# Patient Record
Sex: Female | Born: 1990 | Race: Black or African American | Hispanic: No | Marital: Single | Smoking: Former smoker
Health system: Southern US, Community
[De-identification: ages and names within clinical notes are randomized; demographics above are authoritative.]

## PROBLEM LIST (undated history)

## (undated) ENCOUNTER — Inpatient Hospital Stay (HOSPITAL_COMMUNITY): Payer: Self-pay

## (undated) DIAGNOSIS — E669 Obesity, unspecified: Secondary | ICD-10-CM

## (undated) DIAGNOSIS — R0602 Shortness of breath: Secondary | ICD-10-CM

## (undated) DIAGNOSIS — K219 Gastro-esophageal reflux disease without esophagitis: Secondary | ICD-10-CM

## (undated) DIAGNOSIS — R600 Localized edema: Secondary | ICD-10-CM

## (undated) DIAGNOSIS — R079 Chest pain, unspecified: Secondary | ICD-10-CM

## (undated) DIAGNOSIS — M549 Dorsalgia, unspecified: Secondary | ICD-10-CM

## (undated) DIAGNOSIS — Q6589 Other specified congenital deformities of hip: Secondary | ICD-10-CM

## (undated) DIAGNOSIS — M1990 Unspecified osteoarthritis, unspecified site: Secondary | ICD-10-CM

## (undated) DIAGNOSIS — M2550 Pain in unspecified joint: Secondary | ICD-10-CM

## (undated) DIAGNOSIS — I10 Essential (primary) hypertension: Secondary | ICD-10-CM

## (undated) DIAGNOSIS — F32A Depression, unspecified: Secondary | ICD-10-CM

## (undated) DIAGNOSIS — K5900 Constipation, unspecified: Secondary | ICD-10-CM

## (undated) DIAGNOSIS — M5430 Sciatica, unspecified side: Secondary | ICD-10-CM

## (undated) DIAGNOSIS — F329 Major depressive disorder, single episode, unspecified: Secondary | ICD-10-CM

## (undated) DIAGNOSIS — M069 Rheumatoid arthritis, unspecified: Secondary | ICD-10-CM

## (undated) DIAGNOSIS — M24859 Other specific joint derangements of unspecified hip, not elsewhere classified: Secondary | ICD-10-CM

## (undated) DIAGNOSIS — F419 Anxiety disorder, unspecified: Secondary | ICD-10-CM

## (undated) HISTORY — DX: Anxiety disorder, unspecified: F41.9

## (undated) HISTORY — DX: Unspecified osteoarthritis, unspecified site: M19.90

## (undated) HISTORY — DX: Gastro-esophageal reflux disease without esophagitis: K21.9

## (undated) HISTORY — DX: Chest pain, unspecified: R07.9

## (undated) HISTORY — DX: Pain in unspecified joint: M25.50

## (undated) HISTORY — DX: Localized edema: R60.0

## (undated) HISTORY — DX: Constipation, unspecified: K59.00

## (undated) HISTORY — DX: Dorsalgia, unspecified: M54.9

## (undated) HISTORY — DX: Rheumatoid arthritis, unspecified: M06.9

## (undated) HISTORY — DX: Shortness of breath: R06.02

## (undated) HISTORY — DX: Other specified congenital deformities of hip: Q65.89

## (undated) HISTORY — DX: Depression, unspecified: F32.A

## (undated) HISTORY — DX: Other specific joint derangements of unspecified hip, not elsewhere classified: M24.859

## (undated) NOTE — *Deleted (*Deleted)
It was great to see you!  Our plans for today:  -Today we discussed your back pain.*** -   We are checking some labs today, I will call you if they are abnormal will send you a MyChart message or a letter if they are normal.  If you do not hear about your labs in the next 2 weeks please let us know.***  Take care and seek immediate care sooner if you develop any concerns.   Dr. [facility]

---
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
## 2008-04-07 ENCOUNTER — Encounter: Payer: Self-pay | Admitting: Family Medicine

## 2008-07-13 ENCOUNTER — Encounter: Payer: Self-pay | Admitting: Family Medicine

## 2008-07-15 ENCOUNTER — Telehealth: Payer: Self-pay | Admitting: *Deleted

## 2008-08-17 ENCOUNTER — Emergency Department (HOSPITAL_COMMUNITY): Admission: EM | Admit: 2008-08-17 | Discharge: 2008-08-17 | Payer: Self-pay | Admitting: Emergency Medicine

## 2008-10-08 ENCOUNTER — Emergency Department (HOSPITAL_COMMUNITY): Admission: EM | Admit: 2008-10-08 | Discharge: 2008-10-08 | Payer: Self-pay | Admitting: Emergency Medicine

## 2008-10-24 ENCOUNTER — Inpatient Hospital Stay (HOSPITAL_COMMUNITY): Admission: AD | Admit: 2008-10-24 | Discharge: 2008-10-24 | Payer: Self-pay | Admitting: Obstetrics & Gynecology

## 2009-02-11 ENCOUNTER — Inpatient Hospital Stay (HOSPITAL_COMMUNITY): Admission: AD | Admit: 2009-02-11 | Discharge: 2009-02-11 | Payer: Self-pay | Admitting: Obstetrics and Gynecology

## 2009-04-13 ENCOUNTER — Inpatient Hospital Stay (HOSPITAL_COMMUNITY): Admission: AD | Admit: 2009-04-13 | Discharge: 2009-04-13 | Payer: Self-pay | Admitting: Obstetrics and Gynecology

## 2009-05-08 ENCOUNTER — Inpatient Hospital Stay (HOSPITAL_COMMUNITY): Admission: AD | Admit: 2009-05-08 | Discharge: 2009-05-08 | Payer: Self-pay | Admitting: Obstetrics and Gynecology

## 2009-05-19 ENCOUNTER — Inpatient Hospital Stay (HOSPITAL_COMMUNITY): Admission: RE | Admit: 2009-05-19 | Discharge: 2009-05-21 | Payer: Self-pay | Admitting: Obstetrics and Gynecology

## 2009-05-19 ENCOUNTER — Encounter (INDEPENDENT_AMBULATORY_CARE_PROVIDER_SITE_OTHER): Payer: Self-pay | Admitting: Obstetrics and Gynecology

## 2009-05-19 DIAGNOSIS — O365990 Maternal care for other known or suspected poor fetal growth, unspecified trimester, not applicable or unspecified: Secondary | ICD-10-CM

## 2009-05-19 DIAGNOSIS — O10919 Unspecified pre-existing hypertension complicating pregnancy, unspecified trimester: Secondary | ICD-10-CM

## 2010-07-31 NOTE — Assessment & Plan Note (Signed)
Summary: DEPO/KH   Nurse Visit    Prior Medications: DOXY-CAPS 100 MG CAPS (DOXYCYCLINE HYCLATE) one two times a day x 14 days     Medication Administration  Injection # 1:    Medication: Depo-Provera 150mg     Diagnosis: CONTRACEPTIVE MANAGEMENT (ICD-V25.09)    Route: IM    Site: L deltoid    Exp Date: 12/29/2009    Lot #: [id number]    Mfr: pfizer    Comments: Pt infomred to RTC June 12 - June 26    Patient tolerated injection without complications    Given by: Jone Baseman CMA (September 25, 2007 11:35 AM)  Orders Added: 1)  Depo-Provera 150mg  [J1055] 2)  Est Level 1- [facility] [[id number]]    ]

## 2010-10-03 LAB — CBC
Hemoglobin: 11.1 g/dL — ABNORMAL LOW (ref 12.0–15.0)
MCHC: 32.7 g/dL (ref 30.0–36.0)
MCV: 82.9 fL (ref 78.0–100.0)
Platelets: 242 10*3/uL (ref 150–400)
Platelets: 275 10*3/uL (ref 150–400)
RBC: 4.04 MIL/uL (ref 3.87–5.11)
RDW: 14.1 % (ref 11.5–15.5)
RDW: 14.3 % (ref 11.5–15.5)
WBC: 9.8 10*3/uL (ref 4.0–10.5)
WBC: 11.6 10*3/uL — ABNORMAL HIGH (ref 4.0–10.5)

## 2010-10-03 LAB — RPR: RPR Ser Ql: NONREACTIVE

## 2010-10-03 LAB — COMPREHENSIVE METABOLIC PANEL
ALT: 11 U/L (ref 0–35)
AST: 16 U/L (ref 0–37)
Alkaline Phosphatase: 157 U/L — ABNORMAL HIGH (ref 39–117)
CO2: 24 mEq/L (ref 19–32)
GFR calc non Af Amer: >60 mL/min (ref >60)
Glucose, Bld: 81 mg/dL (ref 70–99)
Potassium: 3.8 mEq/L (ref 3.5–5.1)
Sodium: 134 mEq/L — ABNORMAL LOW (ref 135–145)

## 2010-10-03 LAB — LACTATE DEHYDROGENASE: LDH: 133 U/L (ref 94–250)

## 2010-10-04 LAB — WET PREP, GENITAL: Yeast Wet Prep HPF POC: NONE SEEN

## 2010-10-06 LAB — URINALYSIS, ROUTINE W REFLEX MICROSCOPIC
Ketones, ur: NEGATIVE mg/dL
Nitrite: NEGATIVE
Specific Gravity, Urine: >1.03 — ABNORMAL HIGH (ref 1.005–1.030)
Urobilinogen, UA: 0.2 mg/dL (ref 0.0–1.0)
pH: 6 (ref 5.0–8.0)

## 2010-10-06 LAB — WET PREP, GENITAL
Trich, Wet Prep: NONE SEEN
Yeast Wet Prep HPF POC: NONE SEEN

## 2010-10-06 LAB — URINE MICROSCOPIC-ADD ON

## 2010-10-06 LAB — STREP B DNA PROBE

## 2010-10-06 LAB — FETAL FIBRONECTIN: Fetal Fibronectin: NEGATIVE

## 2010-10-09 ENCOUNTER — Emergency Department (HOSPITAL_COMMUNITY)
Admission: EM | Admit: 2010-10-09 | Discharge: 2010-10-09 | Disposition: A | Discharge status: Home / Self Care | Payer: Self-pay | Attending: Emergency Medicine | Admitting: Emergency Medicine

## 2010-10-09 DIAGNOSIS — I10 Essential (primary) hypertension: Secondary | ICD-10-CM | POA: Insufficient documentation

## 2010-10-09 DIAGNOSIS — R112 Nausea with vomiting, unspecified: Secondary | ICD-10-CM | POA: Insufficient documentation

## 2010-10-09 DIAGNOSIS — R42 Dizziness and giddiness: Secondary | ICD-10-CM | POA: Insufficient documentation

## 2010-10-09 LAB — URINALYSIS, ROUTINE W REFLEX MICROSCOPIC
Bilirubin Urine: NEGATIVE
Glucose, UA: NEGATIVE mg/dL
Hgb urine dipstick: NEGATIVE
Ketones, ur: NEGATIVE mg/dL
Nitrite: NEGATIVE
Specific Gravity, Urine: 1.022 (ref 1.005–1.030)
pH: 5.5 (ref 5.0–8.0)

## 2010-10-09 LAB — URINE MICROSCOPIC-ADD ON

## 2010-10-09 LAB — POCT I-STAT, CHEM 8
BUN: 14 mg/dL (ref 6–23)
Calcium, Ion: 1.17 mmol/L (ref 1.12–1.32)
Chloride: 105 mEq/L (ref 96–112)
Creatinine, Ser: 1.2 mg/dL (ref 0.4–1.2)
Glucose, Bld: 90 mg/dL (ref 70–99)
TCO2: 27 mmol/L (ref 0–100)

## 2010-10-10 LAB — URINALYSIS, ROUTINE W REFLEX MICROSCOPIC
Bilirubin Urine: NEGATIVE
Bilirubin Urine: NEGATIVE
Hgb urine dipstick: NEGATIVE
Ketones, ur: NEGATIVE mg/dL
Nitrite: NEGATIVE
Nitrite: NEGATIVE
Specific Gravity, Urine: 1.009 (ref 1.005–1.030)
Urobilinogen, UA: 1 mg/dL (ref 0.0–1.0)
pH: 7 (ref 5.0–8.0)

## 2010-10-10 LAB — CBC
HCT: 37 % (ref 36.0–46.0)
Hemoglobin: 12.3 g/dL (ref 12.0–15.0)
RDW: 14.1 % (ref 11.5–15.5)
WBC: 12.3 10*3/uL — ABNORMAL HIGH (ref 4.0–10.5)

## 2010-10-10 LAB — GC/CHLAMYDIA PROBE AMP, GENITAL: Chlamydia, DNA Probe: POSITIVE — AB

## 2010-10-10 LAB — URINE MICROSCOPIC-ADD ON

## 2010-10-10 LAB — HCG, QUANTITATIVE, PREGNANCY: hCG, Beta Chain, Quant, S: 65423 m[IU]/mL — ABNORMAL HIGH (ref <5)

## 2010-10-10 LAB — WET PREP, GENITAL

## 2010-11-16 NOTE — Discharge Summary (Signed)
Shirley [name], Shirley [name]              ACCOUNT NO.:  [id number]   MEDICAL RECORD NO.:  [id number]          PATIENT TYPE:  INP   LOCATION:  9104                          FACILITY:  [facility]   PHYSICIAN:  Tracy L. Mayford Knife, M.D.DATE OF BIRTH:  1990/08/28   DATE OF ADMISSION:  07/30/2005  DATE OF DISCHARGE:  08/03/2005                                 DISCHARGE SUMMARY   DISCHARGE ATTENDING:  Elsie Lincoln, M.D.   REASON FOR ADMISSION:  Induction of labor at 41 weeks for elevated blood  pressure and dates.   DISCHARGE DIAGNOSES:  1.  Status post delivery of 41-week viable female.  Delivery complicated by      a mild shoulder dystocia.  2.  Elevated blood pressure/preeclampsia.  3.  Morbid obesity.   CONSULTANTS:  Social work.   ADMISSION LABORATORY:  Hemoglobin 11.2, platelets 286, creatinine 0.6, LFTs  within normal limits, uric acid 5, LDH 154.   HOSPITAL COURSE:  The patient is a 20 year old G1, P0 at 41 weeks, who was  admitted for induction of labor.  Patient's prenatal care was provided in  [facility], but apparently her care was terminated there and her care was  transferred to the teaching service at greater than [redacted] weeks gestation.  On  admission, the patient's initial blood pressure was 136/88.  The patient  complained of a headache, but it was relieved with Tylenol.  Her lab are as  described above.  Fetal heart rate tracing was reactive and reassuring.  A  sterile vaginal exam revealed she was 1 to 2 cm, 50% effaced and -2 station.  The patient was admitted and started on Cytotec and magnesium.  She  ultimately had three Cytotecs and then was changed to Pitocin.  She  ultimately delivered a viable female infant at 2028 on July 31, 2005 over  an intact perineum.  The baby's Apgars were 8 at one minute and 9 at five  minutes.  Delivery was complicated by a mild shoulder dystocia, which was  relieved by downward traction and suprapubic pressure and McRoberts.  Magnesium was  continued postpartum.  She continued to have some elevated  blood pressures.  She was ultimately started on hydrochlorothiazide and  labetalol.  It was felt that this will not need to be continued for long-  term.  However, she will need this followed up as an outpatient.   DISPOSITION:  To home in stable condition.   FOLLOWUP:  On August 05, 2004, with [facility] nurse.  She needs to get her  blood pressure checked that day, and if there are any problems, they are to  call us.  She also has an appointment with family medicine on August 12, 2005 for evaluation of her blood pressure and then she will have a six-week  postpartum exam at [facility].   DISCHARGE MEDICATIONS:  1.  Hydrochlorothiazide 25 mg p.o. daily.  2.  Labetalol 100 mg p.o. b.i.d.  3.  Ibuprofen 600 mg p.o. q.6h. p.r.n.  4.  Patient received Depo-Provera in the hospital, but she would like to  switch to a NuvaRing at the six-week postpartum exam.   DISCHARGE INSTRUCTIONS:  Routine postpartum instructions provided including  to return for signs of infection including a temperature greater than 100.5,  foul-smelling vaginal discharge or a tender uterine fundus.  She was also  counseled on signs and symptoms of preeclampsia.   DIET:  Regular.   ACTIVITY:  Nothing per vagina x6 weeks.           ______________________________  Marc Morgans Mayford Knife, M.D.     TLW/MEDQ  D:  08/03/2005  T:  08/03/2005  Job:  [id number]

## 2011-03-28 LAB — RAPID URINE DRUG SCREEN, HOSP PERFORMED
Barbiturates: NOT DETECTED
Cocaine: NOT DETECTED
Opiates: NOT DETECTED
Tetrahydrocannabinol: POSITIVE — AB

## 2011-03-28 LAB — URINALYSIS, ROUTINE W REFLEX MICROSCOPIC
Hgb urine dipstick: NEGATIVE
Ketones, ur: 15 — AB
Leukocytes, UA: NEGATIVE
Protein, ur: 30 — AB
Urobilinogen, UA: 1

## 2011-03-28 LAB — DIFFERENTIAL
Basophils Absolute: 0
Lymphocytes Relative: 27
Monocytes Absolute: 0.7
Neutro Abs: 4.1
Neutrophils Relative %: 62

## 2011-03-28 LAB — POCT I-STAT, CHEM 8
Calcium, Ion: 0.97 — ABNORMAL LOW
Creatinine, Ser: 1.1
Glucose, Bld: 97
HCT: 46
Hemoglobin: 15.6 — ABNORMAL HIGH

## 2011-03-28 LAB — URINE CULTURE
Colony Count: NO GROWTH
Culture: NO GROWTH

## 2011-03-28 LAB — POCT CARDIAC MARKERS
CKMB, poc: 1.1
Myoglobin, poc: 223
Operator id: 272551

## 2011-03-28 LAB — POCT PREGNANCY, URINE
Operator id: 272551
Preg Test, Ur: NEGATIVE

## 2011-03-28 LAB — URINE MICROSCOPIC-ADD ON

## 2011-03-28 LAB — CBC
Hemoglobin: 14.4
Platelets: 198
RDW: 14.1

## 2011-03-28 LAB — D-DIMER, QUANTITATIVE: D-Dimer, Quant: 0.62 — ABNORMAL HIGH

## 2011-09-04 ENCOUNTER — Encounter (HOSPITAL_COMMUNITY): Payer: Self-pay | Admitting: Emergency Medicine

## 2011-09-04 ENCOUNTER — Emergency Department (HOSPITAL_COMMUNITY)
Admission: EM | Admit: 2011-09-04 | Discharge: 2011-09-04 | Disposition: A | Discharge status: Home / Self Care | Payer: Self-pay | Attending: Emergency Medicine | Admitting: Emergency Medicine

## 2011-09-04 DIAGNOSIS — R51 Headache: Secondary | ICD-10-CM | POA: Insufficient documentation

## 2011-09-04 DIAGNOSIS — R059 Cough, unspecified: Secondary | ICD-10-CM | POA: Insufficient documentation

## 2011-09-04 DIAGNOSIS — R05 Cough: Secondary | ICD-10-CM | POA: Insufficient documentation

## 2011-09-04 DIAGNOSIS — F17200 Nicotine dependence, unspecified, uncomplicated: Secondary | ICD-10-CM | POA: Insufficient documentation

## 2011-09-04 DIAGNOSIS — J020 Streptococcal pharyngitis: Secondary | ICD-10-CM | POA: Insufficient documentation

## 2011-09-04 DIAGNOSIS — IMO0001 Reserved for inherently not codable concepts without codable children: Secondary | ICD-10-CM | POA: Insufficient documentation

## 2011-09-04 MED ORDER — PENICILLIN V POTASSIUM 500 MG PO TABS: 500.0000 mg | ORAL_TABLET | Freq: Three times a day (TID) | ORAL | Status: AC

## 2011-09-04 MED ORDER — PENICILLIN V POTASSIUM 250 MG PO TABS
500.0000 mg | ORAL_TABLET | Freq: Once | ORAL | Status: AC
  Administered 2011-09-04: 500 mg via ORAL
  Filled 2011-09-04: qty 2

## 2011-09-04 NOTE — ED Notes (Signed)
Patient complaining of headache, fever, sore throat, and body aches for the past two days. Denies shortness of breath and chest pain.

## 2011-09-04 NOTE — Discharge Instructions (Signed)
Strep Throat Strep throat is an infection of the throat caused by a bacteria named Streptococcus pyogenes. Your caregiver may call the infection streptococcal "tonsillitis" or "pharyngitis" depending on whether there are signs of inflammation in the tonsils or back of the throat. Strep throat is most common in children from 40 to 23 years old during the cold months of the year, but it can occur in people of any age during any season. This infection is spread from person to person (contagious) through coughing, sneezing, or other close contact. SYMPTOMS   Fever or chills.   Painful, swollen, red tonsils or throat.   Pain or difficulty when swallowing.   White or yellow spots on the tonsils or throat.   Swollen, tender lymph nodes or "glands" of the neck or under the jaw.   Red rash all over the body (rare).  DIAGNOSIS  Many different infections can cause the same symptoms. A test must be done to confirm the diagnosis so the right treatment can be given. A "rapid strep test" can help your caregiver make the diagnosis in a few minutes. If this test is not available, a light swab of the infected area can be used for a throat culture test. If a throat culture test is done, results are usually available in a day or two. TREATMENT  Strep throat is treated with antibiotic medicine. HOME CARE INSTRUCTIONS   Gargle with 1 tsp of salt in 1 cup of warm water, 3 to 4 times per day or as needed for comfort.   Family members who also have a sore throat or fever should be tested for strep throat and treated with antibiotics if they have the strep infection.   Make sure everyone in your household washes their hands well.   Do not share food, drinking cups, or personal items that could cause the infection to spread to others.   You may need to eat a soft food diet until your sore throat gets better.   Drink enough water and fluids to keep your urine clear or pale yellow. This will help prevent  dehydration.   Get plenty of rest.   Stay home from school, daycare, or work until you have been on antibiotics for 24 hours.   Only take over-the-counter or prescription medicines for pain, discomfort, or fever as directed by your caregiver.   If antibiotics are prescribed, take them as directed. Finish them even if you start to feel better.  SEEK MEDICAL CARE IF:   The glands in your neck continue to enlarge.   You develop a rash, cough, or earache.   You cough up green, yellow-brown, or bloody sputum.   You have pain or discomfort not controlled by medicines.   Your problems seem to be getting worse rather than better.  SEEK IMMEDIATE MEDICAL CARE IF:   You develop any new symptoms such as vomiting, severe headache, stiff or painful neck, chest pain, shortness of breath, or trouble swallowing.   You develop severe throat pain, drooling, or changes in your voice.   You develop swelling of the neck, or the skin on the neck becomes red and tender.   You have a fever.   You develop signs of dehydration, such as fatigue, dry mouth, and decreased urination.   You become increasingly sleepy, or you cannot wake up completely.  Document Released: 06/14/2000 Document Revised: 06/06/2011 Document Reviewed: 08/16/2010 [facility] Patient Information 2012 [place], [place]. Your strep test is positive, he had been given a prescription  for pen VK 3 times a day.  Please make sure to complete the entire course of antibiotics, you can take Tylenol, ibuprofen, in between for discomfort, fevers

## 2011-09-04 NOTE — ED Provider Notes (Signed)
History     CSN: [id number]  Arrival date & time 09/04/11  2003   First MD Initiated Contact with Patient 09/04/11 2027      Chief Complaint  Patient presents with  . Headache  . Generalized Body Aches    (Consider location/radiation/quality/duration/timing/severity/associated sxs/prior treatment) HPI Comments: Patient reports 2 days of sore throat, myalgias, headache, nonproductive cough.  She has been taking over-the-counter Tylenol for fevers, with moderate results  Patient is a 21 y.o. female presenting with headaches. The history is provided by the patient.  Headache  This is a new problem. The current episode started 2 days ago. The problem occurs constantly. The headache is associated with an unknown factor. The pain is located in the frontal and bilateral region. The quality of the pain is described as dull. The pain is at a severity of 4/10. The pain is moderate. The pain does not radiate. Associated symptoms include a fever. Pertinent negatives include no anorexia, no malaise/fatigue, no shortness of breath and no nausea. She has tried NSAIDs for the symptoms.    Past Medical History  Diagnosis Date  . No significant past medical history     History reviewed. No pertinent past surgical history.  History reviewed. No pertinent family history.  History  Substance Use Topics  . Smoking status: Current Everyday Smoker -- 1.0 packs/day  . Smokeless tobacco: Not on file  . Alcohol Use: Yes    OB History    Grav Para Term Preterm Abortions TAB SAB Ect Mult Living                  Review of Systems  Constitutional: Positive for fever. Negative for malaise/fatigue.  HENT: Negative for congestion and rhinorrhea.   Respiratory: Positive for cough. Negative for shortness of breath.   Gastrointestinal: Negative for nausea, abdominal pain and anorexia.  Genitourinary: Negative for dysuria.  Musculoskeletal: Positive for myalgias.  Neurological: Positive for headaches.  Negative for dizziness.    Allergies  Review of patient's allergies indicates no known allergies.  Home Medications  No current outpatient prescriptions on file.  BP 164/111  Pulse 87  Temp(Src) 100 F (37.8 C) (Oral)  Resp 20  SpO2 99%  LMP 08/21/2011  Physical Exam  Constitutional: She is oriented to person, place, and time. She appears well-developed and well-nourished.  HENT:  Head: Normocephalic.  Mouth/Throat: Uvula is midline, oropharynx is clear and moist and mucous membranes are normal.  Eyes: Pupils are equal, round, and reactive to light.  Neck: Normal range of motion. Neck supple. No thyromegaly present.  Cardiovascular: Normal rate.   Pulmonary/Chest: Effort normal.  Abdominal: Soft.  Musculoskeletal: Normal range of motion. She exhibits no tenderness.  Neurological: She is alert and oriented to person, place, and time.  Skin: Skin is warm and dry.    ED Course  Procedures (including critical care time)  Labs Reviewed  RAPID STREP SCREEN - Abnormal; Notable for the following:    Streptococcus, Group A Screen (Direct) POSITIVE (*)    All other components within normal limits   No results found.   1. Strep pharyngitis       MDM  This is most likely influenza as the patient did not receive her immunization this year and acute onset of all symptoms at the same time of sore throat, nonproductive cough, body aches, headache and fever.  Will obtain rapid strep test as her posterior pharynx is beefy red, although I do not see any exudates on the  tonsils Rapid strep is positive.  Discussed treatment options with patient.  She has decided to take oral tablets versus injection       Arman Filter, NP 09/04/11 2136

## 2011-09-05 NOTE — ED Provider Notes (Signed)
Medical screening examination/treatment/procedure(s) were performed by non-physician practitioner and as supervising physician I was immediately available for consultation/collaboration.   Laray Anger, DO 09/05/11 1401

## 2011-09-20 NOTE — Telephone Encounter (Signed)
error 

## 2011-09-20 NOTE — Telephone Encounter (Signed)
This encounter was created in error - please disregard.

## 2011-10-10 ENCOUNTER — Emergency Department (HOSPITAL_COMMUNITY): Payer: Self-pay

## 2011-10-10 ENCOUNTER — Encounter (HOSPITAL_COMMUNITY): Payer: Self-pay | Admitting: Nurse Practitioner

## 2011-10-10 ENCOUNTER — Emergency Department (HOSPITAL_COMMUNITY)
Admission: EM | Admit: 2011-10-10 | Discharge: 2011-10-10 | Disposition: A | Discharge status: Home / Self Care | Payer: Self-pay | Attending: Emergency Medicine | Admitting: Emergency Medicine

## 2011-10-10 DIAGNOSIS — S46919A Strain of unspecified muscle, fascia and tendon at shoulder and upper arm level, unspecified arm, initial encounter: Secondary | ICD-10-CM

## 2011-10-10 DIAGNOSIS — M25519 Pain in unspecified shoulder: Secondary | ICD-10-CM | POA: Insufficient documentation

## 2011-10-10 DIAGNOSIS — I10 Essential (primary) hypertension: Secondary | ICD-10-CM | POA: Insufficient documentation

## 2011-10-10 DIAGNOSIS — IMO0002 Reserved for concepts with insufficient information to code with codable children: Secondary | ICD-10-CM | POA: Insufficient documentation

## 2011-10-10 HISTORY — DX: Essential (primary) hypertension: I10

## 2011-10-10 MED ORDER — HYDROCODONE-ACETAMINOPHEN 5-325 MG PO TABS: 2.0000 | ORAL_TABLET | ORAL | Status: AC | PRN

## 2011-10-10 MED ORDER — IBUPROFEN 600 MG PO TABS: 600.0000 mg | ORAL_TABLET | Freq: Four times a day (QID) | ORAL | Status: AC | PRN
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
## 2011-10-10 NOTE — Discharge Instructions (Signed)
Joint Sprain A sprain is a tear or stretch in the ligaments that hold a joint together. Severe sprains may need as long as 3-6 weeks of immobilization and/or exercises to heal completely. Sprained joints should be rested and protected. If not, they can become unstable and prone to re-injury. Proper treatment can reduce your pain, shorten the period of disability, and reduce the risk of repeated injuries. TREATMENT   Rest and elevate the injured joint to reduce pain and swelling.   Apply ice packs to the injury for 20-30 minutes every 2-3 hours for the next 2-3 days.   Keep the injury wrapped in a compression bandage or splint as long as the joint is painful or as instructed by your caregiver.   Do not use the injured joint until it is completely healed to prevent re-injury and chronic instability. Follow the instructions of your caregiver.   Long-term sprain management may require exercises and/or treatment by a physical therapist. Taping or special braces may help stabilize the joint until it is completely better.  SEEK MEDICAL CARE IF:   You develop increased pain or swelling of the joint.   You develop increasing redness and warmth of the joint.   You develop a fever.   It becomes stiff.   Your hand or foot gets cold or numb.  Document Released: 07/25/2004 Document Revised: 06/06/2011 Document Reviewed: 07/04/2008 [employer] Patient Information 2012 [employer]. 
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
## 2012-07-29 ENCOUNTER — Emergency Department (HOSPITAL_COMMUNITY): Payer: Self-pay

## 2012-07-29 ENCOUNTER — Emergency Department (HOSPITAL_COMMUNITY)
Admission: EM | Admit: 2012-07-29 | Discharge: 2012-07-29 | Disposition: A | Discharge status: Home / Self Care | Payer: Self-pay | Attending: Emergency Medicine | Admitting: Emergency Medicine

## 2012-07-29 DIAGNOSIS — L089 Local infection of the skin and subcutaneous tissue, unspecified: Secondary | ICD-10-CM | POA: Insufficient documentation

## 2012-07-29 DIAGNOSIS — Z87891 Personal history of nicotine dependence: Secondary | ICD-10-CM | POA: Insufficient documentation

## 2012-07-29 DIAGNOSIS — I10 Essential (primary) hypertension: Secondary | ICD-10-CM | POA: Insufficient documentation

## 2012-07-29 DIAGNOSIS — R6883 Chills (without fever): Secondary | ICD-10-CM | POA: Insufficient documentation

## 2012-07-29 DIAGNOSIS — M2540 Effusion, unspecified joint: Secondary | ICD-10-CM | POA: Insufficient documentation

## 2012-07-29 DIAGNOSIS — Z791 Long term (current) use of non-steroidal anti-inflammatories (NSAID): Secondary | ICD-10-CM | POA: Insufficient documentation

## 2012-07-29 MED ORDER — SULFAMETHOXAZOLE-TRIMETHOPRIM 800-160 MG PO TABS: 1.0000 | ORAL_TABLET | Freq: Two times a day (BID) | ORAL | Status: DC

## 2012-07-29 MED ORDER — SULFAMETHOXAZOLE-TMP DS 800-160 MG PO TABS
1.0000 | ORAL_TABLET | Freq: Once | ORAL | Status: AC
  Administered 2012-07-29: 1 via ORAL
  Filled 2012-07-29: qty 1

## 2012-07-29 NOTE — ED Provider Notes (Signed)
Medical screening examination/treatment/procedure(s) were performed by non-physician practitioner and as supervising physician I was immediately available for consultation/collaboration.   Nekeisha Aure, MD 07/29/12 2320 

## 2012-07-29 NOTE — ED Provider Notes (Signed)
History   This chart was scribed for Shirley [name], non-physician practitioner, working with Shirley Octave, MD by Shirley [name], ED Scribe. This patient was seen in room TR08C/TR08C and the patient's care was started at 1958.    CSN: [id number]  Arrival date & time 07/29/12  1820   First MD Initiated Contact with Patient 07/29/12 1958      Chief Complaint  Patient presents with  . finger pain     The history is provided by the patient. No language interpreter was used.   Shirley [name] is a 22 y.o. female who presents to the Emergency Department complaining of intermittent, gradually worsening right index finger pain with associated swelling over the past 3 weeks. She denies any known injuries or trauma. She rates the pain 7/10. She has taken Aleve without relief. She states that her right thumb has recently started to swell also. She has also tried icing the area some relief. She denies any fevers but reports chills.   Past Medical History  Diagnosis Date  . No significant past medical history   . Hypertension     No past surgical history on file.  No family history on file.  History  Substance Use Topics  . Smoking status: Former Smoker -- 1.0 packs/day  . Smokeless tobacco: Not on file  . Alcohol Use: Yes     Comment: social    OB History    Grav Para Term Preterm Abortions TAB SAB Ect Mult Living                  Review of Systems  Constitutional: Positive for chills. Negative for fever.  Musculoskeletal: Positive for joint swelling and arthralgias.  All other systems reviewed and are negative.    Allergies  Review of patient's allergies indicates no known allergies.  Home Medications   Current Outpatient Rx  Name  Route  Sig  Dispense  Refill  . NAPROXEN SODIUM 220 MG PO TABS   Oral   Take 440 mg by mouth 2 (two) times daily as needed. For pain           BP 157/110  Pulse 74  Temp 98.6 F (37 C) (Oral)  SpO2 98%  Physical  Exam  Nursing note and vitals reviewed. Constitutional: She is oriented to person, place, and time. She appears well-developed and well-nourished. No distress.  HENT:  Head: Normocephalic and atraumatic.  Eyes: EOM are normal.  Neck: Neck supple. No tracheal deviation present.  Cardiovascular: Normal rate, regular rhythm, normal heart sounds and intact distal pulses.   Pulmonary/Chest: Effort normal and breath sounds normal. No respiratory distress.  Musculoskeletal: Normal range of motion. She exhibits edema and tenderness.       Tenderness to palpation and mild edema over the tip of the distal phalanx of the right index finger. Good capillary refill. Full ROM. Tenderness at the DIP joint of the right index. Mild erythema noted. No induration, fluctuance, or drainage noted.   Neurological: She is alert and oriented to person, place, and time.       Sensation intact to light touch.  Skin: Skin is warm and dry.  Psychiatric: She has a normal mood and affect. Her behavior is normal.    ED Course  Procedures (including critical care time)  DIAGNOSTIC STUDIES: Oxygen Saturation is 98% on room air, normal by my interpretation.    COORDINATION OF CARE:  20:58-Discussed planned course of treatment with the patient including an x-ray,  who is agreeable at this time.    Labs Reviewed - No data to display Dg Finger Index Right  07/29/2012  *RADIOLOGY REPORT*  Clinical Data: Pain distal index finger for 3 weeks.  RIGHT INDEX FINGER 2+V  Comparison: None.  Findings: No evidence of fracture, dislocation, arthropathy, four are not to 08/02 or other radiographic finding.  IMPRESSION: Negative   Original Report Authenticated By: Paulina Fusi, M.D.      1. Finger infection       MDM  22 year old female with skin infection of her distal index finger. No evidence of abscess or paronychia. No fluctuance or induration. I will place her on Bactrim. I advised her to use warm compresses. She will  followup with a resource in one week to establish care at primary care and for recheck. Return precautions discussed. Infection care precautions discussed. Patient states her understanding of plan and is agreeable.   I personally performed the services described in this documentation, which was scribed in my presence. The recorded information has been reviewed and is accurate.       Trevor Mace, PA-C 07/29/12 2208

## 2012-07-29 NOTE — ED Notes (Signed)
Pt states right pointer finger is tender to touch with some swelling noted x 3 weeks. Pt denies trauma to finger. Pt able to wiggle digit, skin warm and dry, pulses present. Some redness noted.

## 2012-08-07 ENCOUNTER — Encounter (HOSPITAL_COMMUNITY): Payer: Self-pay | Admitting: Adult Health

## 2012-08-07 ENCOUNTER — Emergency Department (HOSPITAL_COMMUNITY)
Admission: EM | Admit: 2012-08-07 | Discharge: 2012-08-08 | Disposition: A | Discharge status: Home / Self Care | Payer: Medicaid Other | Attending: Emergency Medicine | Admitting: Emergency Medicine

## 2012-08-07 DIAGNOSIS — R5383 Other fatigue: Secondary | ICD-10-CM | POA: Insufficient documentation

## 2012-08-07 DIAGNOSIS — R5381 Other malaise: Secondary | ICD-10-CM | POA: Insufficient documentation

## 2012-08-07 DIAGNOSIS — R51 Headache: Secondary | ICD-10-CM | POA: Insufficient documentation

## 2012-08-07 DIAGNOSIS — I10 Essential (primary) hypertension: Secondary | ICD-10-CM | POA: Insufficient documentation

## 2012-08-07 DIAGNOSIS — E860 Dehydration: Secondary | ICD-10-CM | POA: Insufficient documentation

## 2012-08-07 DIAGNOSIS — R6883 Chills (without fever): Secondary | ICD-10-CM | POA: Insufficient documentation

## 2012-08-07 DIAGNOSIS — Z87891 Personal history of nicotine dependence: Secondary | ICD-10-CM | POA: Insufficient documentation

## 2012-08-07 NOTE — ED Notes (Addendum)
Presents with "I feel like I am going to pass out, I am hot and I get chills, my legs get heavy for a whole day I feel like this" pt is alert and oriented. dizziness is worse with change of position.  Pt denies chest pain, denies SOB, C/o headache. She states I just feel dizziness and like I need to lay down and close my eyes. CBG 107. PT is very restless

## 2012-08-08 LAB — URINALYSIS, ROUTINE W REFLEX MICROSCOPIC
Glucose, UA: NEGATIVE mg/dL
Hgb urine dipstick: NEGATIVE
Protein, ur: 30 mg/dL — AB
Specific Gravity, Urine: 1.034 — ABNORMAL HIGH (ref 1.005–1.030)
Urobilinogen, UA: 1 mg/dL (ref 0.0–1.0)

## 2012-08-08 LAB — CBC WITH DIFFERENTIAL/PLATELET
Basophils Absolute: 0 10*3/uL (ref 0.0–0.1)
Basophils Relative: 0 % (ref 0–1)
Eosinophils Relative: 4 % (ref 0–5)
HCT: 38.5 % (ref 36.0–46.0)
Hemoglobin: 13 g/dL (ref 12.0–15.0)
MCHC: 33.8 g/dL (ref 30.0–36.0)
MCV: 79.4 fL (ref 78.0–100.0)
Monocytes Absolute: 0.6 10*3/uL (ref 0.1–1.0)
Monocytes Relative: 7 % (ref 3–12)
Neutro Abs: 6.3 10*3/uL (ref 1.7–7.7)
RDW: 14.7 % (ref 11.5–15.5)

## 2012-08-08 LAB — URINE MICROSCOPIC-ADD ON

## 2012-08-08 LAB — PREGNANCY, URINE: Preg Test, Ur: NEGATIVE

## 2012-08-08 NOTE — ED Notes (Signed)
Patient given copy of discharge paperwork; went over discharge instructions.  Patient instructed to drink plenty of fluids, follow up with primary care physician, and to return to the ED for new, worsening, or concerning symptoms.  Patient signed paper discharge instructions.

## 2012-08-08 NOTE — ED Notes (Signed)
To see charting done from 0000 to now (4098), refer to patient's paper chart.  EP[facility] was in downtime.  Resuming charting through EPIC at this time.

## 2012-08-08 NOTE — ED Notes (Signed)
Patient currently sitting up in bed; no respiratory or acute distress noted.  Patient updated on plan of care; informed patient that we are currently waiting on urinalysis results to come back.  Patient pending discharge upon urinalysis results.  Patient denies any other needs at this time; will continue to monitor.

## 2012-08-10 ENCOUNTER — Encounter (HOSPITAL_COMMUNITY): Payer: Self-pay | Admitting: Nurse Practitioner

## 2012-08-10 ENCOUNTER — Emergency Department (HOSPITAL_COMMUNITY)
Admission: EM | Admit: 2012-08-10 | Discharge: 2012-08-10 | Disposition: A | Discharge status: Home / Self Care | Payer: Self-pay | Attending: Emergency Medicine | Admitting: Emergency Medicine

## 2012-08-10 DIAGNOSIS — Z79899 Other long term (current) drug therapy: Secondary | ICD-10-CM | POA: Insufficient documentation

## 2012-08-10 DIAGNOSIS — Z792 Long term (current) use of antibiotics: Secondary | ICD-10-CM | POA: Insufficient documentation

## 2012-08-10 DIAGNOSIS — I10 Essential (primary) hypertension: Secondary | ICD-10-CM | POA: Insufficient documentation

## 2012-08-10 DIAGNOSIS — Z87891 Personal history of nicotine dependence: Secondary | ICD-10-CM | POA: Insufficient documentation

## 2012-08-10 DIAGNOSIS — R42 Dizziness and giddiness: Secondary | ICD-10-CM | POA: Insufficient documentation

## 2012-08-10 DIAGNOSIS — M542 Cervicalgia: Secondary | ICD-10-CM | POA: Insufficient documentation

## 2012-08-10 MED ORDER — ACETAMINOPHEN 325 MG PO TABS
650.0000 mg | ORAL_TABLET | Freq: Once | ORAL | Status: AC
  Administered 2012-08-10: 650 mg via ORAL
  Filled 2012-08-10: qty 2

## 2012-08-10 NOTE — ED Notes (Signed)
Pt family at desk asking about wait time, pt informed, pt skin warm and dry, alert and oriented, sitting in chair.

## 2012-08-10 NOTE — ED Provider Notes (Signed)
Medical screening examination/treatment/procedure(s) were performed by non-physician practitioner and as supervising physician I was immediately available for consultation/collaboration.   Loren Racer, MD 08/10/12 [phone]

## 2012-08-10 NOTE — ED Provider Notes (Signed)
History     CSN: [id number]  Arrival date & time 08/10/12  1524   First MD Initiated Contact with Patient 08/10/12 1808      Chief Complaint  Patient presents with  . Dizziness    (Consider location/radiation/quality/duration/timing/severity/associated sxs/prior treatment) HPI  22 year old female presents complaining of dizziness and knot in the back of head.  History was obtained through patient and through family member who is at bedside. Patient reports since Friday she has been having sensations of feeling tired, dizzy, and "I feel like I am going to pass out". She reports initially she was laying on the ground at her house when she feels tired. Her dad saw her laying on the ground, was concerned, and brought to ER for evaluation. Patient was evaluated and was treated for dehydration with IV fluid. She was discharge home, with encouragement to drink plenty of fluids. Patient reports she has been drinking fluid. She has noticed several not appearing in the back of her neck which is tender to the touch. Described as a throbbing sensation worsening with palpation, 10 out of 10 in severity. She currently denies dizziness. Denies sensation of room spinning, or lightheadedness. Reports a she is currently on her second day of her normal menstruation. She denies fever, chills, headache, double vision, sneezing, coughing, sore throat, ear pain, chest pain, shortness of breath, abdominal pain, nausea, vomiting, diarrhea, dysuria, or rash. No treatment tried at home. No recent sick contact.  Past Medical History  Diagnosis Date  . No significant past medical history   . Hypertension     History reviewed. No pertinent past surgical history.  History reviewed. No pertinent family history.  History  Substance Use Topics  . Smoking status: Former Smoker -- 1.00 packs/day  . Smokeless tobacco: Not on file  . Alcohol Use: Yes     Comment: social    OB History   Grav Para Term Preterm  Abortions TAB SAB Ect Mult Living                  Review of Systems  Constitutional:       10 Systems reviewed and all are negative for acute change except as noted in the HPI.     Allergies  Review of patient's allergies indicates no known allergies.  Home Medications   Current Outpatient Rx  Name  Route  Sig  Dispense  Refill  . naproxen sodium (ANAPROX) 220 MG tablet   Oral   Take 440 mg by mouth 2 (two) times daily as needed. For pain         . sulfamethoxazole-trimethoprim (BACTRIM DS,SEPTRA DS) 800-160 MG per tablet   Oral   Take 1 tablet by mouth 2 (two) times daily. One po bid x 7 days   14 tablet   0     BP 120/92  Pulse 93  Temp(Src) 98.9 F (37.2 C) (Oral)  Resp 15  SpO2 95%  LMP 06/28/2012  Physical Exam  Nursing note and vitals reviewed. Constitutional: She is oriented to person, place, and time. She appears well-developed and well-nourished. No distress.  Awake, alert, nontoxic appearance  HENT:  Head: Normocephalic and atraumatic.  Right Ear: External ear normal.  Left Ear: External ear normal.  Nose: Nose normal.  Mouth/Throat: Oropharynx is clear and moist. No oropharyngeal exudate.  No impacted cerumen  Eyes: Conjunctivae and EOM are normal. Pupils are equal, round, and reactive to light. Right eye exhibits no discharge. Left eye exhibits no discharge.  No nystagmus  Neck: Normal range of motion. Neck supple.  Patient reports of tenderness even with slight touch to the back of her neck, however no evidence of abscess, or lymphadenopathy, or cellulitis noted. Able to move neck in all range of motion with no nuchal rigidity.  Cardiovascular: Normal rate and regular rhythm.  Exam reveals no gallop and no friction rub.   No murmur heard. Pulmonary/Chest: Effort normal. No respiratory distress. She exhibits no tenderness.  Abdominal: Soft. There is no tenderness. There is no rebound.  Musculoskeletal: Normal range of motion. She exhibits no  edema and no tenderness.  ROM appears intact, no obvious focal weakness  Neurological: She is alert and oriented to person, place, and time. She has normal strength. No cranial nerve deficit or sensory deficit. She displays a negative Romberg sign. Coordination and gait normal. GCS eye subscore is 4. GCS verbal subscore is 5. GCS motor subscore is 6.  Mental status and motor strength appears intact  Skin: No rash noted.  Psychiatric: She has a normal mood and affect.    ED Course  Procedures (including critical care time)  Labs Reviewed - No data to display No results found.   No diagnosis found.  6:52 PM Patient has been seen and evaluate by me for complaints of dizziness and pain to the back of her neck. No evidence of dizziness and no complaint of dizziness currently. She has no evidence of nuchal rigidity concerning for meningitis. No obvious lymphadenopathy noted, all evidence of infection. Patient appears nontoxic. She has no complaints concerning of upper respiratory infection at this time. Will check H&H, electrolytes, along with her urine and her pregnancy test. We'll check orthostatic vital sign. Tylenol given for pain.  8:28 PM Patient requests to be discharged. She reports that she has a urine checked and her pregnancy take not too long ago and she does not think she is pregnant. She felt comfortable going home, I think patient is stable for discharge. She has no evidence concerning for dehydration, vital signs stable, she is afebrile, she has no meningismal sign nor does she has any other concerning symptoms on my exam. Return precautions provided. Patient voiced understanding and agrees with plan.   BP 112/64  Pulse 72  Temp(Src) 98.5 F (36.9 C) (Oral)  Resp 20  SpO2 99%  LMP 06/28/2012   1. Neck pain 2. dizziness   MDM         Fayrene Helper, PA-C 08/10/12 2030

## 2012-08-10 NOTE — ED Notes (Signed)
Pt states she can feel several knots under skin on back of head x 3 days and today began to feel dizzy when walking "like I might pass out." denies LOC or head trauma. A&Ox4, resp e/u

## 2012-08-10 NOTE — ED Notes (Signed)
Patient refused in and out cath 

## 2012-08-15 ENCOUNTER — Emergency Department (HOSPITAL_COMMUNITY): Payer: Self-pay

## 2012-08-15 ENCOUNTER — Other Ambulatory Visit: Payer: Self-pay

## 2012-08-15 ENCOUNTER — Emergency Department (HOSPITAL_COMMUNITY)
Admission: EM | Admit: 2012-08-15 | Discharge: 2012-08-15 | Disposition: A | Discharge status: Home / Self Care | Payer: Self-pay | Attending: Emergency Medicine | Admitting: Emergency Medicine

## 2012-08-15 ENCOUNTER — Encounter (HOSPITAL_COMMUNITY): Payer: Self-pay | Admitting: Cardiology

## 2012-08-15 DIAGNOSIS — G43909 Migraine, unspecified, not intractable, without status migrainosus: Secondary | ICD-10-CM | POA: Insufficient documentation

## 2012-08-15 DIAGNOSIS — Z3202 Encounter for pregnancy test, result negative: Secondary | ICD-10-CM | POA: Insufficient documentation

## 2012-08-15 DIAGNOSIS — R5383 Other fatigue: Secondary | ICD-10-CM | POA: Insufficient documentation

## 2012-08-15 DIAGNOSIS — I10 Essential (primary) hypertension: Secondary | ICD-10-CM | POA: Insufficient documentation

## 2012-08-15 DIAGNOSIS — Z87891 Personal history of nicotine dependence: Secondary | ICD-10-CM | POA: Insufficient documentation

## 2012-08-15 DIAGNOSIS — H53149 Visual discomfort, unspecified: Secondary | ICD-10-CM | POA: Insufficient documentation

## 2012-08-15 DIAGNOSIS — N958 Other specified menopausal and perimenopausal disorders: Secondary | ICD-10-CM | POA: Insufficient documentation

## 2012-08-15 DIAGNOSIS — R42 Dizziness and giddiness: Secondary | ICD-10-CM | POA: Insufficient documentation

## 2012-08-15 DIAGNOSIS — R110 Nausea: Secondary | ICD-10-CM | POA: Insufficient documentation

## 2012-08-15 DIAGNOSIS — R5381 Other malaise: Secondary | ICD-10-CM | POA: Insufficient documentation

## 2012-08-15 DIAGNOSIS — R232 Flushing: Secondary | ICD-10-CM

## 2012-08-15 LAB — URINALYSIS, ROUTINE W REFLEX MICROSCOPIC
Bilirubin Urine: NEGATIVE
Hgb urine dipstick: NEGATIVE
Nitrite: NEGATIVE
Protein, ur: NEGATIVE mg/dL
Urobilinogen, UA: 1 mg/dL (ref 0.0–1.0)

## 2012-08-15 LAB — POCT I-STAT, CHEM 8
Calcium, Ion: 1.17 mmol/L (ref 1.12–1.23)
Creatinine, Ser: 0.9 mg/dL (ref 0.50–1.10)
Glucose, Bld: 94 mg/dL (ref 70–99)
HCT: 40 % (ref 36.0–46.0)
Hemoglobin: 13.6 g/dL (ref 12.0–15.0)
TCO2: 30 mmol/L (ref 0–100)

## 2012-08-15 MED ORDER — POTASSIUM CHLORIDE CRYS ER 20 MEQ PO TBCR
40.0000 meq | EXTENDED_RELEASE_TABLET | Freq: Once | ORAL | Status: AC
  Administered 2012-08-15: 40 meq via ORAL
  Filled 2012-08-15: qty 2

## 2012-08-15 NOTE — ED Provider Notes (Signed)
Medical screening examination/treatment/procedure(s) were performed by non-physician practitioner and as supervising physician I was immediately available for consultation/collaboration.  Eleftherios Dudenhoeffer K Linker, MD 08/15/12 2314 

## 2012-08-15 NOTE — ED Notes (Signed)
Pt reports she has been having hot flashes for the past week and a half. Reports she was seen here for this about a week ago and was dehydrated. Reports she feels like she is going to faint. Reports neck pain and c/o of knot on the back of her neck. Denies any vision changes.

## 2012-08-15 NOTE — ED Provider Notes (Signed)
History     CSN: [id number]  Arrival date & time 08/15/12  1901   First MD Initiated Contact with Patient 08/15/12 2004      Chief Complaint  Patient presents with  . Hot Flashes    (Consider location/radiation/quality/duration/timing/severity/associated sxs/prior treatment) HPI Comments: Shirley [name] is a 22 y.o. female with a history of hypertension presents emergency department with multiple complaints.  Patient reports that she's been having 2 of your symptoms over the last 2 weeks and has been evaluated in this emergency department twice with no resolve.  Patient was originally diagnosed with dehydration and given fluid boluses in the emergency department.  After that she later developed severe migraines that have been coming and going associated with photophobia and nausea. HAs are relieved by lying down in dark room.  She's been feeling general fatigue and weakness and a couple of days ago developed hyperemesis that lasted 2 days.  Patient states that she also had some cervical lymphadenopathy the sentence resolved.  Patient denies a history of migraines, head trauma, current neck pain, fevers, night sweats, chills, diarrhea, abdominal pain, sore throat, coughing, change in vision, shortness of breath, chest pain, leg swelling, menstrual disorder, alopecia, tinnitus,  hot or cold intolerance  The history is provided by the patient and medical records.    Past Medical History  Diagnosis Date  . No significant past medical history   . Hypertension     History reviewed. No pertinent past surgical history.  History reviewed. No pertinent family history.  History  Substance Use Topics  . Smoking status: Former Smoker -- 1.00 packs/day  . Smokeless tobacco: Not on file  . Alcohol Use: Yes     Comment: social    OB History   Grav Para Term Preterm Abortions TAB SAB Ect Mult Living                  Review of Systems  Allergies  Review of patient's allergies indicates  no known allergies.  Home Medications   Current Outpatient Rx  Name  Route  Sig  Dispense  Refill  . naproxen sodium (ANAPROX) 220 MG tablet   Oral   Take 440 mg by mouth 2 (two) times daily as needed. For pain         . sulfamethoxazole-trimethoprim (BACTRIM DS,SEPTRA DS) 800-160 MG per tablet   Oral   Take 1 tablet by mouth 2 (two) times daily. One po bid x 7 days. Patient has one more pill left until she finish course.           BP 139/95  Pulse 98  Temp(Src) 99 F (37.2 C) (Oral)  Resp 18  SpO2 98%  LMP 07/24/2012  Physical Exam  Nursing note and vitals reviewed. Constitutional: She is oriented to person, place, and time. She appears well-developed and well-nourished. No distress.  HENT:  Head: Normocephalic and atraumatic.  Oropharynx clear and moist without exudate.  Lips dry and cracking.  Eyes: Conjunctivae and EOM are normal.  Neck: Normal range of motion.  Soft nontender with normal range of motion and no nuchal rigidity.  Pulmonary/Chest: Effort normal.  Abdominal:  Morbidly obese  Musculoskeletal: Normal range of motion.  Neurological: She is alert and oriented to person, place, and time.  Cranial nerves II through XII intact.  Intact distal sensation. Good coordination.  Normal gait.  Skin: Skin is warm and dry. No rash noted. She is not diaphoretic.  Psychiatric: She has a normal mood and  affect. Her behavior is normal.    ED Course  Procedures (including critical care time)  Labs Reviewed  POCT I-STAT, CHEM 8 - Abnormal; Notable for the following:    Potassium 3.1 (*)    All other components within normal limits  URINALYSIS, ROUTINE W REFLEX MICROSCOPIC  POCT PREGNANCY, URINE   Ct Head Wo Contrast  08/15/2012  *RADIOLOGY REPORT*  Clinical Data: Hot flashes  CT HEAD WITHOUT CONTRAST  Technique:  Contiguous axial images were obtained from the base of the skull through the vertex without contrast.  Comparison: None.  Findings: The brain has a  normal appearance without evidence for hemorrhage, infarction, hydrocephalus, or mass lesion.  There is no extra axial fluid collection.  The skull and paranasal sinuses are normal.  IMPRESSION:  1.  No acute intracranial abnormalities.  Normal exam   Original Report Authenticated By: Signa Kell, M.D.      No diagnosis found.    MDM  Patient is a 22 year old female with no significant past medical history that presents emergency department for the third visit evaluating dizziness, headaches and hot flashes.  There were no focal neuro deficits on exam & pt ambulates throughout ED without difficulty, however since patient did not have a history of headaches or migraines CT head performed.  Imaging reviewed with no acute abnormalities. Low concern for pseudotumor cerebri as patient has normal visual neuro exam and headaches are relieved by lying down.  Had in-depth conversation that patient will need a primary care physician workup to find etiology of symptoms.  Resource guide and information on the affordable care active given at disposition as well as information on migraines.  Strict return precautions discussed.        Jaci Carrel, [place] 08/15/12 2259

## 2012-09-26 ENCOUNTER — Encounter (HOSPITAL_COMMUNITY): Payer: Self-pay | Admitting: Emergency Medicine

## 2012-09-26 ENCOUNTER — Emergency Department (HOSPITAL_COMMUNITY)
Admission: EM | Admit: 2012-09-26 | Discharge: 2012-09-26 | Disposition: A | Discharge status: Home / Self Care | Payer: Medicaid Other | Attending: Emergency Medicine | Admitting: Emergency Medicine

## 2012-09-26 DIAGNOSIS — F17200 Nicotine dependence, unspecified, uncomplicated: Secondary | ICD-10-CM | POA: Insufficient documentation

## 2012-09-26 DIAGNOSIS — T5494XA Toxic effect of unspecified corrosive substance, undetermined, initial encounter: Secondary | ICD-10-CM

## 2012-09-26 DIAGNOSIS — Y9229 Other specified public building as the place of occurrence of the external cause: Secondary | ICD-10-CM | POA: Insufficient documentation

## 2012-09-26 DIAGNOSIS — Y9389 Activity, other specified: Secondary | ICD-10-CM | POA: Insufficient documentation

## 2012-09-26 DIAGNOSIS — IMO0002 Reserved for concepts with insufficient information to code with codable children: Secondary | ICD-10-CM | POA: Insufficient documentation

## 2012-09-26 DIAGNOSIS — T5491XA Toxic effect of unspecified corrosive substance, accidental (unintentional), initial encounter: Secondary | ICD-10-CM | POA: Insufficient documentation

## 2012-09-26 DIAGNOSIS — I10 Essential (primary) hypertension: Secondary | ICD-10-CM | POA: Insufficient documentation

## 2012-09-26 MED ORDER — TETRACAINE HCL 0.5 % OP SOLN
2.0000 [drp] | Freq: Once | OPHTHALMIC | Status: AC
  Administered 2012-09-26: 2 [drp] via OPHTHALMIC
  Filled 2012-09-26: qty 2
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
## 2012-09-26 NOTE — ED Notes (Signed)
Pt dc to home.  Pt states understanding to dc instructions.  Pt taken to car via w/c.  Family at side

## 2012-09-26 NOTE — ED Notes (Signed)
md at bedside to evaluate pt.  Drops placed in pt eyes.  Pt states feeling some better now.  Pt remains crying hysterically.  Family at bedside.
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
## 2012-09-26 NOTE — ED Notes (Signed)
Pt uncooperative in Triage.  Unable to obtain vitals.

## 2013-10-17 ENCOUNTER — Emergency Department (HOSPITAL_COMMUNITY): Payer: Medicaid Other

## 2013-10-17 ENCOUNTER — Encounter (HOSPITAL_COMMUNITY): Payer: Self-pay | Admitting: Emergency Medicine

## 2013-10-17 ENCOUNTER — Inpatient Hospital Stay (HOSPITAL_COMMUNITY)
Admission: EM | Admit: 2013-10-17 | Discharge: 2013-10-23 | DRG: 603 | Disposition: A | Discharge status: Home / Self Care | Payer: Medicaid Other | Attending: Internal Medicine | Admitting: Internal Medicine

## 2013-10-17 DIAGNOSIS — K006 Disturbances in tooth eruption: Secondary | ICD-10-CM | POA: Diagnosis present

## 2013-10-17 DIAGNOSIS — M542 Cervicalgia: Secondary | ICD-10-CM | POA: Diagnosis not present

## 2013-10-17 DIAGNOSIS — L0293 Carbuncle, unspecified: Secondary | ICD-10-CM

## 2013-10-17 DIAGNOSIS — R221 Localized swelling, mass and lump, neck: Secondary | ICD-10-CM

## 2013-10-17 DIAGNOSIS — K1120 Sialoadenitis, unspecified: Secondary | ICD-10-CM | POA: Diagnosis present

## 2013-10-17 DIAGNOSIS — K113 Abscess of salivary gland: Secondary | ICD-10-CM

## 2013-10-17 DIAGNOSIS — R51 Headache: Secondary | ICD-10-CM | POA: Diagnosis present

## 2013-10-17 DIAGNOSIS — L708 Other acne: Secondary | ICD-10-CM

## 2013-10-17 DIAGNOSIS — K122 Cellulitis and abscess of mouth: Secondary | ICD-10-CM

## 2013-10-17 DIAGNOSIS — I10 Essential (primary) hypertension: Secondary | ICD-10-CM | POA: Diagnosis present

## 2013-10-17 DIAGNOSIS — R220 Localized swelling, mass and lump, head: Secondary | ICD-10-CM | POA: Diagnosis present

## 2013-10-17 DIAGNOSIS — K029 Dental caries, unspecified: Secondary | ICD-10-CM | POA: Diagnosis present

## 2013-10-17 DIAGNOSIS — K047 Periapical abscess without sinus: Secondary | ICD-10-CM | POA: Diagnosis present

## 2013-10-17 DIAGNOSIS — L0292 Furuncle, unspecified: Secondary | ICD-10-CM

## 2013-10-17 DIAGNOSIS — Z6841 Body Mass Index (BMI) 40.0 and over, adult: Secondary | ICD-10-CM

## 2013-10-17 DIAGNOSIS — L0201 Cutaneous abscess of face: Principal | ICD-10-CM | POA: Diagnosis present

## 2013-10-17 DIAGNOSIS — M25569 Pain in unspecified knee: Secondary | ICD-10-CM

## 2013-10-17 DIAGNOSIS — F17200 Nicotine dependence, unspecified, uncomplicated: Secondary | ICD-10-CM | POA: Diagnosis present

## 2013-10-17 DIAGNOSIS — E876 Hypokalemia: Secondary | ICD-10-CM | POA: Diagnosis present

## 2013-10-17 DIAGNOSIS — L03211 Cellulitis of face: Principal | ICD-10-CM

## 2013-10-17 LAB — I-STAT CHEM 8, ED
BUN: 8 mg/dL (ref 6–23)
CALCIUM ION: 1.17 mmol/L (ref 1.12–1.23)
CHLORIDE: 99 meq/L (ref 96–112)
Creatinine, Ser: 0.9 mg/dL (ref 0.50–1.10)
Glucose, Bld: 87 mg/dL (ref 70–99)
HCT: 37 % (ref 36.0–46.0)
Hemoglobin: 12.6 g/dL (ref 12.0–15.0)
Potassium: 3.3 mEq/L — ABNORMAL LOW (ref 3.7–5.3)
Sodium: 140 mEq/L (ref 137–147)
TCO2: 30 mmol/L (ref 0–100)

## 2013-10-17 LAB — CBC WITH DIFFERENTIAL/PLATELET
Basophils Absolute: 0 10*3/uL (ref 0.0–0.1)
Basophils Relative: 0 % (ref 0–1)
EOS PCT: 1 % (ref 0–5)
Eosinophils Absolute: 0.2 10*3/uL (ref 0.0–0.7)
HEMATOCRIT: 35.4 % — AB (ref 36.0–46.0)
Hemoglobin: 11.6 g/dL — ABNORMAL LOW (ref 12.0–15.0)
LYMPHS ABS: 3.6 10*3/uL (ref 0.7–4.0)
LYMPHS PCT: 23 % (ref 12–46)
MCH: 26.4 pg (ref 26.0–34.0)
MCHC: 32.8 g/dL (ref 30.0–36.0)
MCV: 80.6 fL (ref 78.0–100.0)
MONO ABS: 0.7 10*3/uL (ref 0.1–1.0)
Monocytes Relative: 5 % (ref 3–12)
Neutro Abs: 11.2 10*3/uL — ABNORMAL HIGH (ref 1.7–7.7)
Neutrophils Relative %: 71 % (ref 43–77)
Platelets: 254 10*3/uL (ref 150–400)
RBC: 4.39 MIL/uL (ref 3.87–5.11)
RDW: 14.1 % (ref 11.5–15.5)
WBC: 15.7 10*3/uL — AB (ref 4.0–10.5)

## 2013-10-17 MED ORDER — IOHEXOL 300 MG/ML  SOLN
80.0000 mL | Freq: Once | INTRAMUSCULAR | Status: AC | PRN
  Administered 2013-10-17: 80 mL via INTRAVENOUS

## 2013-10-17 MED ORDER — CLINDAMYCIN PHOSPHATE 600 MG/50ML IV SOLN
600.0000 mg | Freq: Once | INTRAVENOUS | Status: AC
  Administered 2013-10-17: 600 mg via INTRAVENOUS
  Filled 2013-10-17: qty 50

## 2013-10-17 MED ORDER — HYDROMORPHONE HCL PF 1 MG/ML IJ SOLN
0.5000 mg | Freq: Once | INTRAMUSCULAR | Status: AC
  Administered 2013-10-17: 0.5 mg via INTRAVENOUS
  Filled 2013-10-17: qty 1

## 2013-10-17 NOTE — ED Notes (Signed)
Pt presents to department for evaluation of R lower molar pain. Ongoing for several days. 9/10 pain at the time. Pt also states facial swelling and headache. Pt is alert and oriented x4. No signs of distress noted.

## 2013-10-17 NOTE — ED Notes (Signed)
Pt st's she has had tooth pain on right lower side for several weeks but now right side of face is swollen

## 2013-10-17 NOTE — ED Provider Notes (Signed)
CSN: [id number]     Arrival date & time 10/17/13  1808 History  This chart was scribed for non-physician practitioner working with [name], MD by Elveria Risingimelie Horne, ED Scribe. This patient was seen in room TR09C/TR09C and the patient's care was started at 7:56 PM.   Chief Complaint  Patient presents with  . Dental Pain      The history is provided by the patient. No language interpreter was used.   HPI Comments: Shirley [name] is a 23 y.o. female with history of HTN who presents to the Emergency Department complaining of right lower dental pain, ongoing for several months. Patient reports associated facial swelling and headache that began a few days ago. Patient reports visiting a dentist, but says he won't treat her until her blood pressure is controlled. Patient is not currently taking anticoagulants. Patient does treat dental pain with any medications.  Patient is admitted smoker.   Past Medical History  Diagnosis Date  . No significant past medical history   . Hypertension    History reviewed. No pertinent past surgical history. No family history on file. History  Substance Use Topics  . Smoking status: Current Every Day Smoker -- 1.00 packs/day  . Smokeless tobacco: Not on file  . Alcohol Use: Yes   OB History   Grav Para Term Preterm Abortions TAB SAB Ect Mult Living                 Review of Systems  HENT: Positive for dental problem and facial swelling.   Neurological: Positive for headaches.      Allergies  Review of patient's allergies indicates no known allergies.  Home Medications   Prior to Admission medications   Medication Sig Start Date End Date Taking? Authorizing Provider  naproxen sodium (ANAPROX) 220 MG tablet Take 440 mg by mouth 2 (two) times daily as needed. For pain    Historical Provider, MD  sulfamethoxazole-trimethoprim (BACTRIM DS,SEPTRA DS) 800-160 MG per tablet Take 1 tablet by mouth 2 (two) times daily. One po bid x 7 days. Patient  has one more pill left until she finish course. 07/29/12   Trevor Maceobyn M Albert, PA-C   Triage Vitals: BP 128/69  Pulse 72  Temp(Src) 98.7 F (37.1 C) (Oral)  Resp 18  SpO2 98% Physical Exam  Nursing note and vitals reviewed. Constitutional: She is oriented to person, place, and time. She appears well-developed and well-nourished. No distress.  HENT:  Head: Normocephalic and atraumatic.  Mouth/Throat: Dental abscesses present.  Swelling noted to the right submandibular area, area is tender, indurated. Multiple dental carries is on exam. Right lower second molar tender to palpation. No obvious surrounding abscess. No swelling under the tongue. Uvula and tonsils are normal.  Eyes: Conjunctivae and EOM are normal.  Neck: Normal range of motion. Neck supple. No tracheal deviation present.  Cardiovascular: Normal rate, regular rhythm and normal heart sounds.   Pulmonary/Chest: Effort normal and breath sounds normal. No respiratory distress. She has no wheezes. She has no rales.  No stridor  Musculoskeletal: Normal range of motion.  Lymphadenopathy:    She has no cervical adenopathy.  Neurological: She is alert and oriented to person, place, and time.  Skin: Skin is warm and dry.  Psychiatric: She has a normal mood and affect. Her behavior is normal.    ED Course  Procedures (including critical care time) DIAGNOSTIC STUDIES: Oxygen Saturation is 98% on room air, normal by my interpretation.    COORDINATION OF CARE:  7:58 PM- Will order CT scan of neck. Discussed treatment plan with patient at bedside and patient agreed to plan.     Labs Review Labs Reviewed  CBC WITH DIFFERENTIAL - Abnormal; Notable for the following:    WBC 15.7 (*)    Hemoglobin 11.6 (*)    HCT 35.4 (*)    Neutro Abs 11.2 (*)    All other components within normal limits  I-STAT CHEM 8, ED - Abnormal; Notable for the following:    Potassium 3.3 (*)    All other components within normal limits  COMPREHENSIVE  METABOLIC PANEL  CBC  PROTIME-INR    Imaging Review Ct Soft Tissue Neck W Contrast  10/17/2013   CLINICAL DATA:  Right lower molar pain going on for several days. Facial swelling and headache.  EXAM: CT NECK WITH CONTRAST  TECHNIQUE: Multidetector CT imaging of the neck was performed using the standard protocol following the bolus administration of intravenous contrast.  CONTRAST:  80mL OMNIPAQUE IOHEXOL 300 MG/ML  SOLN  COMPARISON:  CT HEAD W/O CM dated 08/15/2012; DG CERVICAL SPINE COMPLETE dated 12/17/2007  FINDINGS: Technically limited study due to the patient's body habitus and significant streak artifact and multiple sections.  The tonsils demonstrate diffuse enlargement somewhat asymmetrically more prominent towards the right. This is likely representing inflammatory process. The right submandibular gland is enlarged with a central low-attenuation structure measuring 1.9 cm diameter. This is consistent with an abscess. Focal lucencies around the periapical region of the right third molar with large caries in that molar. Multiple additional tooth extractions and prominent dental caries noted elsewhere. Retention cyst in the left maxillary antrum. Paranasal sinuses are otherwise clear. Scattered cervical lymph nodes are not pathologically enlarged and are likely reactive. Visualized cervical spine demonstrate some reversal of the usual lordosis, likely positioning.  IMPRESSION: Poor dentition with large caries and periapical lucency along the right third molar suggesting periodontal disease. There is swelling in the right submandibular gland and and right greater than left tonsils with a focal fluid collection consistent with abscess centrally in the right submandibular gland.   Electronically Signed   By: Burman NievesWilliam  Stevens M.D.   On: 10/17/2013 21:48     EKG Interpretation None      MDM   Final diagnoses:  Submandibular abscess    Patient with large right submandibular swelling. Will get CT  of the neck, for evaluation. Labs ordered. Clindamycin ordered IV. Patient is protecting her airway, no respiratory difficulties, no stridor, no difficulty swallowing. Her vital signs are all normal.    CT showed 2 cm submandibular gland abscess. I spoke with Dr. Alda Bertholdeohwith ENT, he will consult on the patient, asked for [facility] to admit  Spoke with [facility], blood in it.   Filed Vitals:   10/17/13 2231 10/18/13 0124 10/18/13 0222 10/18/13 0246  BP: 144/88 159/109 148/90 146/86  Pulse: 66 61  68  Temp: 98.5 F (36.9 C) 98.6 F (37 C)  98.3 F (36.8 C)  TempSrc: Oral Oral    Resp:    18  Height:    5\' 7"  (1.702 m)  Weight:    301 lb (136.533 kg)  SpO2: 100% 100%  98%    I personally performed the services described in this documentation, which was scribed in my presence. The recorded information has been reviewed and is accurate.    Lottie Musselatyana A Ulani Degrasse, PA-C 10/18/13 [phone]

## 2013-10-17 NOTE — ED Notes (Signed)
Pt here for R lower dental pain 9/10. States her face feels swollen.

## 2013-10-17 NOTE — ED Notes (Signed)
Pt to CT at this time.

## 2013-10-17 NOTE — ED Notes (Signed)
Ice pack given to pt.

## 2013-10-18 DIAGNOSIS — K113 Abscess of salivary gland: Secondary | ICD-10-CM | POA: Insufficient documentation

## 2013-10-18 DIAGNOSIS — K122 Cellulitis and abscess of mouth: Secondary | ICD-10-CM | POA: Diagnosis present

## 2013-10-18 DIAGNOSIS — L0201 Cutaneous abscess of face: Secondary | ICD-10-CM

## 2013-10-18 DIAGNOSIS — L03211 Cellulitis of face: Secondary | ICD-10-CM

## 2013-10-18 LAB — CBC
HEMATOCRIT: 34.6 % — AB (ref 36.0–46.0)
Hemoglobin: 11 g/dL — ABNORMAL LOW (ref 12.0–15.0)
MCH: 25.8 pg — AB (ref 26.0–34.0)
MCHC: 31.8 g/dL (ref 30.0–36.0)
MCV: 81 fL (ref 78.0–100.0)
Platelets: 240 10*3/uL (ref 150–400)
RBC: 4.27 MIL/uL (ref 3.87–5.11)
RDW: 14.4 % (ref 11.5–15.5)
WBC: 13.1 10*3/uL — ABNORMAL HIGH (ref 4.0–10.5)

## 2013-10-18 LAB — PROTIME-INR
INR: 1.09 (ref 0.00–1.49)
Prothrombin Time: 13.9 seconds (ref 11.6–15.2)

## 2013-10-18 LAB — COMPREHENSIVE METABOLIC PANEL
ALBUMIN: 2.8 g/dL — AB (ref 3.5–5.2)
ALK PHOS: 139 U/L — AB (ref 39–117)
ALT: 11 U/L (ref 0–35)
AST: 13 U/L (ref 0–37)
BUN: 9 mg/dL (ref 6–23)
CO2: 24 mEq/L (ref 19–32)
Calcium: 8.4 mg/dL (ref 8.4–10.5)
Chloride: 102 mEq/L (ref 96–112)
Creatinine, Ser: 0.71 mg/dL (ref 0.50–1.10)
GFR calc Af Amer: >90 mL/min (ref >90)
GFR calc non Af Amer: >90 mL/min (ref >90)
Glucose, Bld: 87 mg/dL (ref 70–99)
POTASSIUM: 3.5 meq/L — AB (ref 3.7–5.3)
SODIUM: 139 meq/L (ref 137–147)
TOTAL PROTEIN: 7.4 g/dL (ref 6.0–8.3)
Total Bilirubin: 0.4 mg/dL (ref 0.3–1.2)

## 2013-10-18 MED ORDER — ONDANSETRON HCL 4 MG/2ML IJ SOLN: 4.0000 mg | Freq: Four times a day (QID) | INTRAMUSCULAR | Status: DC | PRN

## 2013-10-18 MED ORDER — MORPHINE SULFATE 2 MG/ML IJ SOLN
1.0000 mg | INTRAMUSCULAR | Status: DC | PRN
  Administered 2013-10-18 – 2013-10-22 (×3): 2 mg via INTRAVENOUS
  Filled 2013-10-18 (×3): qty 1

## 2013-10-18 MED ORDER — ONDANSETRON HCL 4 MG/2ML IJ SOLN: 4.0000 mg | Freq: Three times a day (TID) | INTRAMUSCULAR | Status: DC | PRN

## 2013-10-18 MED ORDER — SODIUM CHLORIDE 0.9 % IV SOLN: INTRAVENOUS | Status: AC

## 2013-10-18 MED ORDER — HYDROMORPHONE HCL PF 1 MG/ML IJ SOLN
0.5000 mg | INTRAMUSCULAR | Status: AC | PRN
  Administered 2013-10-18: 0.5 mg via INTRAVENOUS
  Filled 2013-10-18: qty 1

## 2013-10-18 MED ORDER — ONDANSETRON HCL 4 MG PO TABS: 4.0000 mg | ORAL_TABLET | Freq: Four times a day (QID) | ORAL | Status: DC | PRN

## 2013-10-18 MED ORDER — SODIUM CHLORIDE 0.9 % IJ SOLN
3.0000 mL | Freq: Two times a day (BID) | INTRAMUSCULAR | Status: DC
  Administered 2013-10-21: 3 mL via INTRAVENOUS

## 2013-10-18 MED ORDER — OXYCODONE HCL 5 MG PO TABS
5.0000 mg | ORAL_TABLET | ORAL | Status: DC | PRN
  Administered 2013-10-22 (×2): 5 mg via ORAL
  Filled 2013-10-18 (×2): qty 1

## 2013-10-18 MED ORDER — HYDRALAZINE HCL 20 MG/ML IJ SOLN
10.0000 mg | Freq: Three times a day (TID) | INTRAMUSCULAR | Status: DC | PRN
  Administered 2013-10-20: 10 mg via INTRAVENOUS
  Filled 2013-10-18: qty 1

## 2013-10-18 MED ORDER — CLINDAMYCIN PHOSPHATE 600 MG/50ML IV SOLN
600.0000 mg | Freq: Three times a day (TID) | INTRAVENOUS | Status: DC
  Administered 2013-10-18 – 2013-10-21 (×11): 600 mg via INTRAVENOUS
  Filled 2013-10-18 (×13): qty 50

## 2013-10-18 MED ORDER — ENOXAPARIN SODIUM 40 MG/0.4ML ~~LOC~~ SOLN
40.0000 mg | SUBCUTANEOUS | Status: DC
  Administered 2013-10-18 – 2013-10-19 (×2): 40 mg via SUBCUTANEOUS
  Filled 2013-10-18 (×4): qty 0.4

## 2013-10-18 MED ORDER — POTASSIUM CHLORIDE 10 MEQ/100ML IV SOLN
10.0000 meq | INTRAVENOUS | Status: AC
  Administered 2013-10-18 (×4): 10 meq via INTRAVENOUS
  Filled 2013-10-18 (×4): qty 100

## 2013-10-18 MED ORDER — SODIUM CHLORIDE 0.9 % IV SOLN
INTRAVENOUS | Status: DC
  Administered 2013-10-18 – 2013-10-20 (×4): via INTRAVENOUS

## 2013-10-18 NOTE — ED Notes (Signed)
Dr. Patel in to assess pt for admission 

## 2013-10-18 NOTE — H&P (Signed)
[facility] History and Physical  Patient: Shirley [name]  MRN:[id number]  DOB: 09/03/1990  DOS: the patient was seen and examined on 10/18/2013 PCP: Default, Provider, MD  Chief Complaint: Tooth ache  HPI: Shirley [name] is a 22 y.o. female with Past medical history of hypertension and morbid obesity. The patient presented with complaints of toothache on the right side on the lower jaw. She mentions on March 20 she saw her dentist because she was having a toothache for a while. Her dentist recommended removal of teeth. But due to her a unit of blood pressure he refused for the procedure and the patient was also seen by oral surgeon for the same. Since last one week the patient has noted that there is progressive swelling of side of the lower face associated with pain and a sensation of a lump when she swallowed. Denies any fever, chills, chest, shortness of breath, cough, aspiration, nausea, vomiting, abdominal pain, diarrhea, similar rash anywhere else. She does not take any medication at present. She was given ampicillin? By the dentist.  The patient is coming from home. And at her baseline Independent for most of her  ADL.  Review of Systems: as mentioned in the history of present illness.  A Comprehensive review of the other systems is negative.  Past Medical History  Diagnosis Date  . No significant past medical history   . Hypertension    History reviewed. No pertinent past surgical history. Social History:  reports that she has been smoking.  She does not have any smokeless tobacco history on file. She reports that she drinks alcohol. She reports that she does not use illicit drugs.  No Known Allergies  No family history on file.  Prior to Admission medications   Medication Sig Start Date End Date Taking? Authorizing Provider  naproxen sodium (ANAPROX) 220 MG tablet Take 440 mg by mouth 2 (two) times daily as needed. For pain   Yes Historical Provider, MD     Physical Exam: Filed Vitals:   10/17/13 2231 10/18/13 0124 10/18/13 0222 10/18/13 0246  BP: 144/88 159/109 148/90 146/86  Pulse: 66 61  68  Temp: 98.5 F (36.9 C) 98.6 F (37 C)  98.3 F (36.8 C)  TempSrc: Oral Oral    Resp:    18  Height:    5' 7" (1.702 m)  Weight:    136.533 kg (301 lb)  SpO2: 100% 100%  98%    General: Alert, Awake and Oriented to Time, Place and Person. Appear in mild distress Eyes: PERRL ENT: Oral Mucosa clear moist. Neck: no JVD Cardiovascular: S1 and S2 Present, no Murmur, Peripheral Pulses Present Respiratory: Bilateral Air entry equal and Decreased, Clear to Auscultation,  no Crackles,no wheezes Abdomen: Bowel Sound Present, Soft and Non tender Skin: no Rash Extremities: no Pedal edema, no calf tenderness Neurologic: Grossly Unremarkable.  Labs on Admission:  CBC:  Recent Labs Lab 10/17/13 2009 10/17/13 2013  WBC 15.7*  --   NEUTROABS 11.2*  --   HGB 11.6* 12.6  HCT 35.4* 37.0  MCV 80.6  --   PLT 254  --     CMP     Component Value Date/Time   NA 140 10/17/2013 2013   K 3.3* 10/17/2013 2013   CL 99 10/17/2013 2013   CO2 24 05/08/2009 1804   GLUCOSE 87 10/17/2013 2013   BUN 8 10/17/2013 2013   CREATININE 0.90 10/17/2013 2013   CALCIUM 9.0 05/08/2009 1804   PROT 6.4   05/08/2009 1804   ALBUMIN 2.4* 05/08/2009 1804   AST 16 05/08/2009 1804   ALT 11 05/08/2009 1804   ALKPHOS 157* 05/08/2009 1804   BILITOT 0.2* 05/08/2009 1804   GFRNONAA >60 05/08/2009 1804   GFRAA  Value: >60        The eGFR has been calculated using the MDRD equation. This calculation has not been validated in all clinical situations. eGFR's persistently <60 mL/min signify possible Chronic Kidney Disease. 05/08/2009 1804    No results found for this basename: LIPASE, AMYLASE,  in the last 168 hours No results found for this basename: AMMONIA,  in the last 168 hours  No results found for this basename: CKTOTAL, CKMB, CKMBINDEX, TROPONINI,  in the last 168 hours BNP (last  3 results) No results found for this basename: PROBNP,  in the last 8760 hours  Radiological Exams on Admission: Ct Soft Tissue Neck W Contrast  10/17/2013   CLINICAL DATA:  Right lower molar pain going on for several days. Facial swelling and headache.  EXAM: CT NECK WITH CONTRAST  TECHNIQUE: Multidetector CT imaging of the neck was performed using the standard protocol following the bolus administration of intravenous contrast.  CONTRAST:  25m OMNIPAQUE IOHEXOL 300 MG/ML  SOLN  COMPARISON:  CT HEAD W/O CM dated 08/15/2012; DG CERVICAL SPINE COMPLETE dated 12/17/2007  FINDINGS: Technically limited study due to the patient's body habitus and significant streak artifact and multiple sections.  The tonsils demonstrate diffuse enlargement somewhat asymmetrically more prominent towards the right. This is likely representing inflammatory process. The right submandibular gland is enlarged with a central low-attenuation structure measuring 1.9 cm diameter. This is consistent with an abscess. Focal lucencies around the periapical region of the right third molar with large caries in that molar. Multiple additional tooth extractions and prominent dental caries noted elsewhere. Retention cyst in the left maxillary antrum. Paranasal sinuses are otherwise clear. Scattered cervical lymph nodes are not pathologically enlarged and are likely reactive. Visualized cervical spine demonstrate some reversal of the usual lordosis, likely positioning.  IMPRESSION: Poor dentition with large caries and periapical lucency along the right third molar suggesting periodontal disease. There is swelling in the right submandibular gland and and right greater than left tonsils with a focal fluid collection consistent with abscess centrally in the right submandibular gland.   Electronically Signed   By: WLucienne CapersM.D.   On: 10/17/2013 21:48     Assessment/Plan Principal Problem:   Submandibular abscess Active Problems:   OBESITY,  MORBID   HYPERTENSION, BENIGN SYSTEMIC   1. Submandibular abscess The patient is presenting with complaints of swelling of the right lower face. CT shows that she has submandibular abscess with possible local extension. ENT has recommended the patient to be remaining in the hospital and they will follow the patient. Patient will be admitted to telemetry unit we will give her IV fluids, IV Zofran and IV Dilaudid for pain control, IV clindamycin for infection control. Blood cultures are done.  2. Hypertension Patient had elevated blood pressure at there is a physician's office Currently holding off on any medicati  DVT Prophylaxis: subcutaneous Heparin Nutrition: N.p.o.  Code Status: Full  Family Communication: Family was present at bedside, opportunity was given to ask question and all questions were answered satisfactorily at the time of interview. Disposition: Admitted to inpatient in telemetry.  Author: PBerle Mull MD [facility] Pager: [phone]/20/2015, 3:02 AM    If 7PM-7AM, please contact night-coverage [email or web address] Password [email or web address]

## 2013-10-18 NOTE — Progress Notes (Signed)
UR Completed Krystyna Cleckley Graves-Bigelow, RN,BSN [phone]  

## 2013-10-18 NOTE — Progress Notes (Signed)
[facility] PROGRESS NOTE  Arlyss RepressSacoya M Nied LKG:[id number] DOB: [birth year] DOA: 10/17/2013 PCP: Default, Provider, MD  Assessment/Plan: 1. Submandibular abscess  The patient is presenting with complaints of swelling of the right lower face. CT shows that she has submandibular abscess with possible local extension. Continue with IV fluids, IV Zofran and IV Dilaudid for pain control, IV clindamycin for infection control.  Blood cultures.  Dr Suszanne Connerseoh consulted.  WBC trending down.   2. Hypertension; prior history of per prior notes. Was not on medication. She will need work up for secondary cause of hypertension by PCP. Hydralazine PRN.   Hypokalemia; replete with 4 runs.   DVT Prophylaxis: subcutaneous Heparin  Nutrition: clear diet.    Code Status: Full Code.  Family Communication: care discussed with patient.  Disposition Plan:    Consultants:  Dr Suszanne Connerseoh, ENT  Procedures:  none  Antibiotics:  Clindamycin 4-19  HPI/Subjective: complaining of pain submandibular area.   Objective: Filed Vitals:   10/18/13 0246  BP: 146/86  Pulse: 68  Temp: 98.3 F (36.8 C)  Resp: 18    Intake/Output Summary (Last 24 hours) at 10/18/13 0949 Last data filed at 10/18/13 0836  Gross per 24 hour  Intake    265 ml  Output      0 ml  Net    265 ml   Filed Weights   10/18/13 0246  Weight: 136.533 kg (301 lb)    Exam:   General:  No distress. Face, Neck; Submandibular area with swelling, induration   Cardiovascular: S 1, S 2 RRR  Respiratory: CTA  Abdomen: Bs present, soft, NT  Musculoskeletal: no edema.   Data Reviewed: Basic Metabolic Panel:  Recent Labs Lab 10/17/13 2013 10/18/13 0542  NA 140 139  K 3.3* 3.5*  CL 99 102  CO2  --  24  GLUCOSE 87 87  BUN 8 9  CREATININE 0.90 0.71  CALCIUM  --  8.4   Liver Function Tests:  Recent Labs Lab 10/18/13 0542  AST 13  ALT 11  ALKPHOS 139*  BILITOT 0.4  PROT 7.4  ALBUMIN 2.8*   No results found for  this basename: LIPASE, AMYLASE,  in the last 168 hours No results found for this basename: AMMONIA,  in the last 168 hours CBC:  Recent Labs Lab 10/17/13 2009 10/17/13 2013 10/18/13 0542  WBC 15.7*  --  13.1*  NEUTROABS 11.2*  --   --   HGB 11.6* 12.6 11.0*  HCT 35.4* 37.0 34.6*  MCV 80.6  --  81.0  PLT 254  --  240   Cardiac Enzymes: No results found for this basename: CKTOTAL, CKMB, CKMBINDEX, TROPONINI,  in the last 168 hours BNP (last 3 results) No results found for this basename: PROBNP,  in the last 8760 hours CBG: No results found for this basename: GLUCAP,  in the last 168 hours  No results found for this or any previous visit (from the past 240 hour(s)).   Studies: Ct Soft Tissue Neck W Contrast  10/17/2013   CLINICAL DATA:  Right lower molar pain going on for several days. Facial swelling and headache.  EXAM: CT NECK WITH CONTRAST  TECHNIQUE: Multidetector CT imaging of the neck was performed using the standard protocol following the bolus administration of intravenous contrast.  CONTRAST:  80mL OMNIPAQUE IOHEXOL 300 MG/ML  SOLN  COMPARISON:  CT HEAD W/O CM dated 08/15/2012; DG CERVICAL SPINE COMPLETE dated 12/17/2007  FINDINGS: Technically limited study due to the patient's body  habitus and significant streak artifact and multiple sections.  The tonsils demonstrate diffuse enlargement somewhat asymmetrically more prominent towards the right. This is likely representing inflammatory process. The right submandibular gland is enlarged with a central low-attenuation structure measuring 1.9 cm diameter. This is consistent with an abscess. Focal lucencies around the periapical region of the right third molar with large caries in that molar. Multiple additional tooth extractions and prominent dental caries noted elsewhere. Retention cyst in the left maxillary antrum. Paranasal sinuses are otherwise clear. Scattered cervical lymph nodes are not pathologically enlarged and are likely  reactive. Visualized cervical spine demonstrate some reversal of the usual lordosis, likely positioning.  IMPRESSION: Poor dentition with large caries and periapical lucency along the right third molar suggesting periodontal disease. There is swelling in the right submandibular gland and and right greater than left tonsils with a focal fluid collection consistent with abscess centrally in the right submandibular gland.   Electronically Signed   By: Burman NievesWilliam  Stevens M.D.   On: 10/17/2013 21:48    Scheduled Meds: . sodium chloride   Intravenous STAT  . clindamycin (CLEOCIN) IV  600 mg Intravenous 3 times per day  . enoxaparin (LOVENOX) injection  40 mg Subcutaneous Q24H  . potassium chloride  10 mEq Intravenous Q1 Hr x 4  . sodium chloride  3 mL Intravenous Q12H   Continuous Infusions: . sodium chloride 100 mL/hr at 10/18/13 0321    Principal Problem:   Submandibular abscess Active Problems:   OBESITY, MORBID   HYPERTENSION, BENIGN SYSTEMIC    Time spent: 35 minutes.     Eliot Popper A Aking Klabunde  [facility] Pager [phone]. If 7PM-7AM, please contact night-coverage at [email or web address], password [email or web address] 10/18/2013, 9:49 AM  LOS: 1 day

## 2013-10-18 NOTE — Consult Note (Signed)
Reason for Consult: Submandibular abscess Referring Physician: Jeannett Senior, PA  HPI:  Shirley [name] is an 23 y.o. female who presented to the ER yesterday c/o worsening of her right lower dental pain, ongoing for several months. Patient reports associated facial swelling and headache that began a few days ago. Patient reports visiting a dentist, but he won't treat her until her blood pressure is controlled. Patient is not currently taking anticoagulants. Patient is admitted smoker. Her CT shows significant dental disease and possible submandibular abscess.     Past Medical History  Diagnosis Date  . No significant past medical history   . Hypertension     History reviewed. No pertinent past surgical history.  No family history on file.  Social History:  reports that she has been smoking.  She does not have any smokeless tobacco history on file. She reports that she drinks alcohol. She reports that she does not use illicit drugs.  Allergies: No Known Allergies  Medications:  I have reviewed the patient's current medications. Scheduled: . sodium chloride   Intravenous STAT  . clindamycin (CLEOCIN) IV  600 mg Intravenous 3 times per day  . enoxaparin (LOVENOX) injection  40 mg Subcutaneous Q24H  . potassium chloride  10 mEq Intravenous Q1 Hr x 4  . sodium chloride  3 mL Intravenous Q12H   ZJQ:BHALPFXTKWIOX (DILAUDID) injection, morphine injection, ondansetron (ZOFRAN) IV, ondansetron, oxyCODONE  Results for orders placed during the hospital encounter of 10/17/13 (from the past 48 hour(s))  CBC WITH DIFFERENTIAL     Status: Abnormal   Collection Time    10/17/13  8:09 PM      Result Value Ref Range   WBC 15.7 (*) 4.0 - 10.5 K/uL   RBC 4.39  3.87 - 5.11 MIL/uL   Hemoglobin 11.6 (*) 12.0 - 15.0 g/dL   HCT 35.4 (*) 36.0 - 46.0 %   MCV 80.6  78.0 - 100.0 fL   MCH 26.4  26.0 - 34.0 pg   MCHC 32.8  30.0 - 36.0 g/dL   RDW 14.1  11.5 - 15.5 %   Platelets 254  150 - 400 K/uL    Neutrophils Relative % 71  43 - 77 %   Neutro Abs 11.2 (*) 1.7 - 7.7 K/uL   Lymphocytes Relative 23  12 - 46 %   Lymphs Abs 3.6  0.7 - 4.0 K/uL   Monocytes Relative 5  3 - 12 %   Monocytes Absolute 0.7  0.1 - 1.0 K/uL   Eosinophils Relative 1  0 - 5 %   Eosinophils Absolute 0.2  0.0 - 0.7 K/uL   Basophils Relative 0  0 - 1 %   Basophils Absolute 0.0  0.0 - 0.1 K/uL  I-STAT CHEM 8, ED     Status: Abnormal   Collection Time    10/17/13  8:13 PM      Result Value Ref Range   Sodium 140  137 - 147 mEq/L   Potassium 3.3 (*) 3.7 - 5.3 mEq/L   Chloride 99  96 - 112 mEq/L   BUN 8  6 - 23 mg/dL   Creatinine, Ser 0.90  0.50 - 1.10 mg/dL   Glucose, Bld 87  70 - 99 mg/dL   Calcium, Ion 1.17  1.12 - 1.23 mmol/L   TCO2 30  0 - 100 mmol/L   Hemoglobin 12.6  12.0 - 15.0 g/dL   HCT 37.0  36.0 - 46.0 %  COMPREHENSIVE METABOLIC PANEL  Status: Abnormal   Collection Time    10/18/13  5:42 AM      Result Value Ref Range   Sodium 139  137 - 147 mEq/L   Potassium 3.5 (*) 3.7 - 5.3 mEq/L   Chloride 102  96 - 112 mEq/L   CO2 24  19 - 32 mEq/L   Glucose, Bld 87  70 - 99 mg/dL   BUN 9  6 - 23 mg/dL   Creatinine, Ser 0.71  0.50 - 1.10 mg/dL   Calcium 8.4  8.4 - 10.5 mg/dL   Total Protein 7.4  6.0 - 8.3 g/dL   Albumin 2.8 (*) 3.5 - 5.2 g/dL   AST 13  0 - 37 U/L   ALT 11  0 - 35 U/L   Alkaline Phosphatase 139 (*) 39 - 117 U/L   Total Bilirubin 0.4  0.3 - 1.2 mg/dL   GFR calc non Af Amer >90  >90 mL/min   GFR calc Af Amer >90  >90 mL/min   Comment: (NOTE)     The eGFR has been calculated using the CKD EPI equation.     This calculation has not been validated in all clinical situations.     eGFR's persistently <90 mL/min signify possible Chronic Kidney     Disease.  CBC     Status: Abnormal   Collection Time    10/18/13  5:42 AM      Result Value Ref Range   WBC 13.1 (*) 4.0 - 10.5 K/uL   RBC 4.27  3.87 - 5.11 MIL/uL   Hemoglobin 11.0 (*) 12.0 - 15.0 g/dL   HCT 34.6 (*) 36.0 - 46.0 %    MCV 81.0  78.0 - 100.0 fL   MCH 25.8 (*) 26.0 - 34.0 pg   MCHC 31.8  30.0 - 36.0 g/dL   RDW 14.4  11.5 - 15.5 %   Platelets 240  150 - 400 K/uL  PROTIME-INR     Status: None   Collection Time    10/18/13  5:42 AM      Result Value Ref Range   Prothrombin Time 13.9  11.6 - 15.2 seconds   INR 1.09  0.00 - 1.49    Ct Soft Tissue Neck W Contrast  10/17/2013   CLINICAL DATA:  Right lower molar pain going on for several days. Facial swelling and headache.  EXAM: CT NECK WITH CONTRAST  TECHNIQUE: Multidetector CT imaging of the neck was performed using the standard protocol following the bolus administration of intravenous contrast.  CONTRAST:  35mL OMNIPAQUE IOHEXOL 300 MG/ML  SOLN  COMPARISON:  CT HEAD W/O CM dated 08/15/2012; DG CERVICAL SPINE COMPLETE dated 12/17/2007  FINDINGS: Technically limited study due to the patient's body habitus and significant streak artifact and multiple sections.  The tonsils demonstrate diffuse enlargement somewhat asymmetrically more prominent towards the right. This is likely representing inflammatory process. The right submandibular gland is enlarged with a central low-attenuation structure measuring 1.9 cm diameter. This is consistent with an abscess. Focal lucencies around the periapical region of the right third molar with large caries in that molar. Multiple additional tooth extractions and prominent dental caries noted elsewhere. Retention cyst in the left maxillary antrum. Paranasal sinuses are otherwise clear. Scattered cervical lymph nodes are not pathologically enlarged and are likely reactive. Visualized cervical spine demonstrate some reversal of the usual lordosis, likely positioning.  IMPRESSION: Poor dentition with large caries and periapical lucency along the right third molar suggesting periodontal disease. There  is swelling in the right submandibular gland and and right greater than left tonsils with a focal fluid collection consistent with abscess centrally  in the right submandibular gland.   Electronically Signed   By: Lucienne Capers M.D.   On: 10/17/2013 21:48   Review of Systems  HENT: Positive for dental problem and facial swelling.  Neurological: Positive for headaches.  Otherwise negative.  Blood pressure 146/86, pulse 68, temperature 98.3 F (36.8 C), temperature source Oral, resp. rate 18, height $RemoveBe'5\' 7"'[id number]$  (1.702 m), weight 301 lb (136.533 kg), last menstrual period 09/17/2013, SpO2 98.00%.  Physical Exam  Constitutional: She is oriented to person, place, and time. She appears well-developed and well-nourished. No distress.  Head: Normocephalic and atraumatic.  Mouth/Throat: Dental caries present.  Swelling noted to the right submandibular area, area is tender, indurated. Right lower second molar tender to palpation.  No swelling under the tongue. Uvula and tonsils are mildly edematous.  Eyes: Conjunctivae and EOM are normal.  Neck: Normal range of motion. Neck supple. No tracheal deviation present. No stridor.  Submandibular area is tender, indurated. Cardiovascular: Normal rate, regular rhythm and normal heart sounds.  Neurological: She is alert and oriented to person, place, and time.  Skin: Skin is warm and dry.  Psychiatric: She has a normal mood and affect. Her behavior is normal.   Assessment/Plan: Submandibular sialoadenitis with possible abscess.  Severe dental carries and possible periapical abscess.  Will continue with IV clindamycin, and monitor for clinical improvement.  If no improvement noted tomorrow, will re-scan.  Ascencion Dike 10/18/2013, 12:53 PM

## 2013-10-19 ENCOUNTER — Encounter (HOSPITAL_COMMUNITY): Payer: Self-pay | Admitting: Radiology

## 2013-10-19 ENCOUNTER — Inpatient Hospital Stay (HOSPITAL_COMMUNITY): Payer: Medicaid Other

## 2013-10-19 DIAGNOSIS — K113 Abscess of salivary gland: Secondary | ICD-10-CM

## 2013-10-19 DIAGNOSIS — L0201 Cutaneous abscess of face: Secondary | ICD-10-CM

## 2013-10-19 DIAGNOSIS — L03211 Cellulitis of face: Secondary | ICD-10-CM

## 2013-10-19 LAB — CBC
HEMATOCRIT: 32.7 % — AB (ref 36.0–46.0)
Hemoglobin: 10.6 g/dL — ABNORMAL LOW (ref 12.0–15.0)
MCH: 26.1 pg (ref 26.0–34.0)
MCHC: 32.4 g/dL (ref 30.0–36.0)
MCV: 80.5 fL (ref 78.0–100.0)
Platelets: 232 10*3/uL (ref 150–400)
RBC: 4.06 MIL/uL (ref 3.87–5.11)
RDW: 14.1 % (ref 11.5–15.5)
WBC: 12.9 10*3/uL — ABNORMAL HIGH (ref 4.0–10.5)

## 2013-10-19 LAB — BASIC METABOLIC PANEL
BUN: 5 mg/dL — AB (ref 6–23)
CO2: 24 mEq/L (ref 19–32)
Calcium: 8.6 mg/dL (ref 8.4–10.5)
Chloride: 103 mEq/L (ref 96–112)
Creatinine, Ser: 0.73 mg/dL (ref 0.50–1.10)
GFR calc Af Amer: >90 mL/min (ref >90)
GFR calc non Af Amer: >90 mL/min (ref >90)
Glucose, Bld: 80 mg/dL (ref 70–99)
Potassium: 3.8 mEq/L (ref 3.7–5.3)
Sodium: 138 mEq/L (ref 137–147)

## 2013-10-19 MED ORDER — IOHEXOL 300 MG/ML  SOLN
75.0000 mL | Freq: Once | INTRAMUSCULAR | Status: AC | PRN
  Administered 2013-10-19: 75 mL via INTRAVENOUS

## 2013-10-19 NOTE — Progress Notes (Addendum)
[facility] PROGRESS NOTE  Shirley [name] WUJ:[id number] DOB: Feb 09, 1991 DOA: 10/17/2013 PCP: Default, Provider, MD  Assessment/Plan: 1. Submandibular abscess  The patient is presenting with complaints of swelling of the right lower face. CT shows that she has submandibular abscess with possible local extension. Continue with IV fluids, IV Zofran and IV Dilaudid for pain control, IV clindamycin for infection control.  Dr Suszanne Connerseoh with ENT following in consultation.  WBC trending down.  Plan to repeat CT neck today, patient might need surgical intervention.    2. Hypertension; prior history of per prior notes. Was not on medication. She will need work up for secondary cause of hypertension by PCP. Hydralazine PRN.   Hypokalemia; resolved.   DVT Prophylaxis: subcutaneous Heparin   Nutrition: clear diet.    Code Status: Full Code.  Family Communication: care discussed with patient.  Disposition Plan: remain inpatient.    Consultants:  Dr Suszanne Connerseoh, ENT  Procedures:  none  Antibiotics:  Clindamycin 4-19  HPI/Subjective: Still with significant pain, feels swelling is worse.   Objective: Filed Vitals:   10/19/13 0500  BP: 139/75  Pulse: 63  Temp: 99 F (37.2 C)  Resp: 12    Intake/Output Summary (Last 24 hours) at 10/19/13 1404 Last data filed at 10/19/13 0900  Gross per 24 hour  Intake    240 ml  Output      0 ml  Net    240 ml   Filed Weights   10/18/13 0246  Weight: 136.533 kg (301 lb)    Exam:   General:  No distress. Face, Neck; Submandibular area with swelling, induration   Cardiovascular: S 1, S 2 RRR  Respiratory: CTA  Abdomen: Bs present, soft, NT  Musculoskeletal: no edema.   Data Reviewed: Basic Metabolic Panel:  Recent Labs Lab 10/17/13 2013 10/18/13 0542 10/19/13 0550  NA 140 139 138  K 3.3* 3.5* 3.8  CL 99 102 103  CO2  --  24 24  GLUCOSE 87 87 80  BUN 8 9 5*  CREATININE 0.90 0.71 0.73  CALCIUM  --  8.4 8.6   Liver  Function Tests:  Recent Labs Lab 10/18/13 0542  AST 13  ALT 11  ALKPHOS 139*  BILITOT 0.4  PROT 7.4  ALBUMIN 2.8*   No results found for this basename: LIPASE, AMYLASE,  in the last 168 hours No results found for this basename: AMMONIA,  in the last 168 hours CBC:  Recent Labs Lab 10/17/13 2009 10/17/13 2013 10/18/13 0542 10/19/13 0550  WBC 15.7*  --  13.1* 12.9*  NEUTROABS 11.2*  --   --   --   HGB 11.6* 12.6 11.0* 10.6*  HCT 35.4* 37.0 34.6* 32.7*  MCV 80.6  --  81.0 80.5  PLT 254  --  240 232   Cardiac Enzymes: No results found for this basename: CKTOTAL, CKMB, CKMBINDEX, TROPONINI,  in the last 168 hours BNP (last 3 results) No results found for this basename: PROBNP,  in the last 8760 hours CBG: No results found for this basename: GLUCAP,  in the last 168 hours  No results found for this or any previous visit (from the past 240 hour(s)).   Studies: Ct Soft Tissue Neck W Contrast  10/17/2013   CLINICAL DATA:  Right lower molar pain going on for several days. Facial swelling and headache.  EXAM: CT NECK WITH CONTRAST  TECHNIQUE: Multidetector CT imaging of the neck was performed using the standard protocol following the bolus administration of  intravenous contrast.  CONTRAST:  80mL OMNIPAQUE IOHEXOL 300 MG/ML  SOLN  COMPARISON:  CT HEAD W/O CM dated 08/15/2012; DG CERVICAL SPINE COMPLETE dated 12/17/2007  FINDINGS: Technically limited study due to the patient's body habitus and significant streak artifact and multiple sections.  The tonsils demonstrate diffuse enlargement somewhat asymmetrically more prominent towards the right. This is likely representing inflammatory process. The right submandibular gland is enlarged with a central low-attenuation structure measuring 1.9 cm diameter. This is consistent with an abscess. Focal lucencies around the periapical region of the right third molar with large caries in that molar. Multiple additional tooth extractions and prominent  dental caries noted elsewhere. Retention cyst in the left maxillary antrum. Paranasal sinuses are otherwise clear. Scattered cervical lymph nodes are not pathologically enlarged and are likely reactive. Visualized cervical spine demonstrate some reversal of the usual lordosis, likely positioning.  IMPRESSION: Poor dentition with large caries and periapical lucency along the right third molar suggesting periodontal disease. There is swelling in the right submandibular gland and and right greater than left tonsils with a focal fluid collection consistent with abscess centrally in the right submandibular gland.   Electronically Signed   By: Burman NievesWilliam  Stevens M.D.   On: 10/17/2013 21:48    Scheduled Meds: . clindamycin (CLEOCIN) IV  600 mg Intravenous 3 times per day  . enoxaparin (LOVENOX) injection  40 mg Subcutaneous Q24H  . sodium chloride  3 mL Intravenous Q12H   Continuous Infusions: . sodium chloride 100 mL/hr at 10/19/13 1202    Principal Problem:   Submandibular abscess Active Problems:   OBESITY, MORBID   HYPERTENSION, BENIGN SYSTEMIC    Time spent: 35 minutes.     Shirley [name]  [facility] Pager [phone]. If 7PM-7AM, please contact night-coverage at [email or web address], password [email or web address] 10/19/2013, 2:04 PM  LOS: 2 days

## 2013-10-19 NOTE — Progress Notes (Signed)
Subjective: Pt c/o significant right neck pain. No improvement from yesterday.  Objective: Vital signs in last 24 hours: Temp:  [98 F (36.7 C)-99.3 F (37.4 C)] 99 F (37.2 C) (04/21 0500) Pulse Rate:  [63-75] 63 (04/21 0500) Resp:  [12-18] 12 (04/21 0500) BP: (139-150)/(75-98) 139/75 mmHg (04/21 0500) SpO2:  [99 %-100 %] 100 % (04/21 0500)  Physical Exam  Constitutional: She is oriented to person, place, and time. She appears well-developed and well-nourished. No distress.  Head: Normocephalic and atraumatic.  Mouth/Throat: Dental caries present.  Swelling noted to the right submandibular area, area is tender, indurated.  No swelling under the tongue. Uvula and tonsils are mildly edematous.  Eyes: Conjunctivae and EOM are normal.  Neck: Normal range of motion. Neck supple. No tracheal deviation present. No stridor. Right submandibular area is tender, indurated.  Cardiovascular: Normal rate, regular rhythm and normal heart sounds.  Neurological: She is alert and oriented to person, place, and time.  Skin: Skin is warm and dry.  Psychiatric: She has a normal mood and affect. Her behavior is normal.    Recent Labs  10/18/13 0542 10/19/13 0550  WBC 13.1* 12.9*  HGB 11.0* 10.6*  HCT 34.6* 32.7*  PLT 240 232    Recent Labs  10/18/13 0542 10/19/13 0550  NA 139 138  K 3.5* 3.8  CL 102 103  CO2 24 24  GLUCOSE 87 80  BUN 9 5*  CREATININE 0.71 0.73  CALCIUM 8.4 8.6    Medications:  I have reviewed the patient's current medications. Scheduled: . clindamycin (CLEOCIN) IV  600 mg Intravenous 3 times per day  . enoxaparin (LOVENOX) injection  40 mg Subcutaneous Q24H  . sodium chloride  3 mL Intravenous Q12H   ZOX:WRUEAVWUJWJPRN:hydrALAZINE, morphine injection, ondansetron (ZOFRAN) IV, ondansetron, oxyCODONE  Assessment/Plan: Submandibular sialoadenitis with possible abscess. Severe dental carries and possible periapical abscess. Continue with IV clindamycin.  Repeat neck CT scan  today. Will follow.    LOS: 2 days   Sui W Renley Banwart 10/19/2013, 12:15 PM

## 2013-10-20 DIAGNOSIS — L0201 Cutaneous abscess of face: Principal | ICD-10-CM

## 2013-10-20 DIAGNOSIS — I10 Essential (primary) hypertension: Secondary | ICD-10-CM

## 2013-10-20 DIAGNOSIS — L03211 Cellulitis of face: Principal | ICD-10-CM

## 2013-10-20 DIAGNOSIS — K113 Abscess of salivary gland: Secondary | ICD-10-CM

## 2013-10-20 LAB — CBC
HCT: 33.6 % — ABNORMAL LOW (ref 36.0–46.0)
Hemoglobin: 10.8 g/dL — ABNORMAL LOW (ref 12.0–15.0)
MCH: 25.9 pg — ABNORMAL LOW (ref 26.0–34.0)
MCHC: 32.1 g/dL (ref 30.0–36.0)
MCV: 80.6 fL (ref 78.0–100.0)
PLATELETS: 242 10*3/uL (ref 150–400)
RBC: 4.17 MIL/uL (ref 3.87–5.11)
RDW: 14.1 % (ref 11.5–15.5)
WBC: 10.9 10*3/uL — AB (ref 4.0–10.5)

## 2013-10-20 LAB — BASIC METABOLIC PANEL
BUN: 10 mg/dL (ref 6–23)
CHLORIDE: 102 meq/L (ref 96–112)
CO2: 23 meq/L (ref 19–32)
CREATININE: 0.78 mg/dL (ref 0.50–1.10)
Calcium: 8.7 mg/dL (ref 8.4–10.5)
GFR calc Af Amer: >90 mL/min (ref >90)
GFR calc non Af Amer: >90 mL/min (ref >90)
Glucose, Bld: 98 mg/dL (ref 70–99)
Potassium: 4 mEq/L (ref 3.7–5.3)
Sodium: 137 mEq/L (ref 137–147)

## 2013-10-20 NOTE — Progress Notes (Signed)
Patient ID: Shirley [name]  female  EXB:[id number]    DOB: [birth year]    DOA: 10/17/2013  PCP: Default, Provider, MD  Assessment/Plan: Principal Problem:   Submandibular abscess - Repeat CT done yesterday showed some improvement in the submandibular abscess - Continue IV fluids, antiemetics, pain control, IV antibiotics - ENT, Dr Suszanne Connerseoh following, recommended continuing IV antibiotics, if she continues to improve, consider switching to oral clindamycin tomorrow - She will need her right mandible with some tooth extracted soon  Active Problems:   OBESITY, MORBID - Counseled on diet and weight control    HYPERTENSION, BENIGN SYSTEMIC - Currently stable  DVT Prophylaxis:  Code Status:  Family Communication: Discussed with the patient at the bedside, her family member was also at the bedside   Disposition:  Consultants:  ENT  Procedures:  None  Antibiotics:  IV clindamycin  Subjective: Patient seen and examined, states that the pain and swelling on the right neck is somewhat improving  Objective: Weight change:   Intake/Output Summary (Last 24 hours) at 10/20/13 1119 Last data filed at 10/19/13 1700  Gross per 24 hour  Intake    720 ml  Output      0 ml  Net    720 ml   Blood pressure 136/74, pulse 57, temperature 98.5 F (36.9 C), temperature source Oral, resp. rate 18, height 5\' 7"  (1.702 m), weight 136.533 kg (301 lb), last menstrual period 09/17/2013, SpO2 93.00%.  Physical Exam: General: Alert and awake, oriented x3, not in any acute distress. HEENT: Submandibular area with swelling, induration CVS: S1-S2 clear, no murmur rubs or gallops Chest: clear to auscultation bilaterally, no wheezing, rales or rhonchi Abdomen: soft nontender, nondistended, normal bowel sounds  Extremities: no cyanosis, clubbing or edema noted bilaterally Neuro: Cranial nerves II-XII intact, no focal neurological deficits  Lab Results: Basic Metabolic Panel:  Recent Labs Lab  10/19/13 0550 10/20/13 0500  NA 138 137  K 3.8 4.0  CL 103 102  CO2 24 23  GLUCOSE 80 98  BUN 5* 10  CREATININE 0.73 0.78  CALCIUM 8.6 8.7   Liver Function Tests:  Recent Labs Lab 10/18/13 0542  AST 13  ALT 11  ALKPHOS 139*  BILITOT 0.4  PROT 7.4  ALBUMIN 2.8*   No results found for this basename: LIPASE, AMYLASE,  in the last 168 hours No results found for this basename: AMMONIA,  in the last 168 hours CBC:  Recent Labs Lab 10/17/13 2009  10/19/13 0550 10/20/13 0500  WBC 15.7*  < > 12.9* 10.9*  NEUTROABS 11.2*  --   --   --   HGB 11.6*  < > 10.6* 10.8*  HCT 35.4*  < > 32.7* 33.6*  MCV 80.6  < > 80.5 80.6  PLT 254  < > 232 242  < > = values in this interval not displayed. Cardiac Enzymes: No results found for this basename: CKTOTAL, CKMB, CKMBINDEX, TROPONINI,  in the last 168 hours BNP: No components found with this basename: POCBNP,  CBG: No results found for this basename: GLUCAP,  in the last 168 hours   Micro Results: No results found for this or any previous visit (from the past 240 hour(s)).  Studies/Results: Ct Soft Tissue Neck W Contrast  10/19/2013   CLINICAL DATA:  23 year old female with facial swelling thought related to right side dental infection. Increasing symptoms. Initial encounter.  EXAM: CT NECK WITH CONTRAST  TECHNIQUE: Multidetector CT imaging of the neck was performed using the  standard protocol following the bolus administration of intravenous contrast.  CONTRAST:  75mL OMNIPAQUE IOHEXOL 300 MG/ML  SOLN  COMPARISON:  10/17/2013.  FINDINGS: Large body habitus. Grossly negative lung apices. Decreased soft tissue detail of the thoracic inlet. Grossly negative thyroid. Retropharyngeal space, parapharyngeal spaces, parotid spaces and major vascular structures in the neck appear stable and within normal limits.  Continued inflammation in the right submandibular space extending to the midline submental space with a central low-density collection  measuring 12 x 10 x 11 mm. This is inseparable from the right submandibular gland which is inflamed. There is surrounding thickening of the platysma and subcutaneous stranding. The central and anterior sublingual space remain spared as before. Reactive appearing right level 1 and level 2 lymph nodes re- identified. Prominent dental caries and periapical lucency of the posterior most right mandible molar (this appears to be the wisdom tooth, with the anterior molar previously extracted) re- identified with cortical breakthrough extending into the right anterior sublingual space (series 2, image 30).  Motion artifact affecting the pharynx. Lingual tonsil, tonsillar pillar and adenoid hypertrophy without tonsillar abscess. Visualized paranasal sinuses and mastoids are clear.  IMPRESSION: 1. Continued right submandibular and submental space infection with odontogenic source at the posterior right mandible. Low-density collection situated amongst a submandibular space phlegmon has slightly decreased in size since 10/17/2013 (now 12 mm diameter, previously 15-19 mm). No other soft tissue space is affected. 2. Reactive right level 1 and level 2 nodes.   Electronically Signed   By: Augusto GambleLee  Hall M.D.   On: 10/19/2013 19:43   Ct Soft Tissue Neck W Contrast  10/17/2013   CLINICAL DATA:  Right lower molar pain going on for several days. Facial swelling and headache.  EXAM: CT NECK WITH CONTRAST  TECHNIQUE: Multidetector CT imaging of the neck was performed using the standard protocol following the bolus administration of intravenous contrast.  CONTRAST:  80mL OMNIPAQUE IOHEXOL 300 MG/ML  SOLN  COMPARISON:  CT HEAD W/O CM dated 08/15/2012; DG CERVICAL SPINE COMPLETE dated 12/17/2007  FINDINGS: Technically limited study due to the patient's body habitus and significant streak artifact and multiple sections.  The tonsils demonstrate diffuse enlargement somewhat asymmetrically more prominent towards the right. This is likely  representing inflammatory process. The right submandibular gland is enlarged with a central low-attenuation structure measuring 1.9 cm diameter. This is consistent with an abscess. Focal lucencies around the periapical region of the right third molar with large caries in that molar. Multiple additional tooth extractions and prominent dental caries noted elsewhere. Retention cyst in the left maxillary antrum. Paranasal sinuses are otherwise clear. Scattered cervical lymph nodes are not pathologically enlarged and are likely reactive. Visualized cervical spine demonstrate some reversal of the usual lordosis, likely positioning.  IMPRESSION: Poor dentition with large caries and periapical lucency along the right third molar suggesting periodontal disease. There is swelling in the right submandibular gland and and right greater than left tonsils with a focal fluid collection consistent with abscess centrally in the right submandibular gland.   Electronically Signed   By: Burman NievesWilliam  Stevens M.D.   On: 10/17/2013 21:48    Medications: Scheduled Meds: . clindamycin (CLEOCIN) IV  600 mg Intravenous 3 times per day  . enoxaparin (LOVENOX) injection  40 mg Subcutaneous Q24H  . sodium chloride  3 mL Intravenous Q12H      LOS: 3 days   Ripudeep Jenna LuoK Rai M.D. [facility] 10/20/2013, 11:19 AM Pager: [phone]  If 7PM-7AM, please contact night-coverage [email or web address] Password  [email or web address]  **Disclaimer: This note was dictated with voice recognition software. Similar sounding words can inadvertently be transcribed and this note may contain transcription errors which may not have been corrected upon publication of note.**

## 2013-10-20 NOTE — Progress Notes (Signed)
Subjective: Pt reports improvement in her neck pain.  CT shows improvement in her submandibular abscess.  Objective: Vital signs in last 24 hours: Temp:  [98.4 F (36.9 C)-99.7 F (37.6 C)] 98.5 F (36.9 C) (04/22 0533) Pulse Rate:  [57-68] 57 (04/22 0533) Resp:  [17-18] 18 (04/22 0533) BP: (136-161)/(74-111) 136/74 mmHg (04/22 0533) SpO2:  [93 %-100 %] 93 % (04/22 0533)  Physical Exam  Constitutional: She is oriented to person, place, and time. She appears well-developed and well-nourished. No distress.  Head: Normocephalic and atraumatic.  Mouth/Throat: Dental caries present.  Swelling noted to the right submandibular area, area is tender, indurated. No swelling under the tongue. Uvula and tonsils are symmetric. Eyes: Conjunctivae and EOM are normal.  Neck: Normal range of motion. Neck supple. No tracheal deviation present. No stridor. Right submandibular area is tender, indurated.  Cardiovascular: Normal rate, regular rhythm and normal heart sounds.  Neurological: She is alert and oriented to person, place, and time.  Skin: Skin is warm and dry.  Psychiatric: She has a normal mood and affect. Her behavior is normal.    Recent Labs  10/19/13 0550 10/20/13 0500  WBC 12.9* 10.9*  HGB 10.6* 10.8*  HCT 32.7* 33.6*  PLT 232 242    Recent Labs  10/19/13 0550 10/20/13 0500  NA 138 137  K 3.8 4.0  CL 103 102  CO2 24 23  GLUCOSE 80 98  BUN 5* 10  CREATININE 0.73 0.78  CALCIUM 8.6 8.7    Medications:  I have reviewed the patient's current medications. Scheduled: . clindamycin (CLEOCIN) IV  600 mg Intravenous 3 times per day  . enoxaparin (LOVENOX) injection  40 mg Subcutaneous Q24H  . sodium chloride  3 mL Intravenous Q12H   ONG:[id number]:hydrALAZINE, morphine injection, ondansetron (ZOFRAN) IV, ondansetron, oxyCODONE  Assessment/Plan: Pt's submandibular abscess is improving with IV abx treatment.  The abscess is likely odontogenic in origin.  She will need her right  mandibular wisdom tooth extracted as soon as possible.  Continue IV abx.  If she continues to improve, consider switching to po clindamycin tomorrow.   LOS: 3 days   Sui W Rodriguez Aguinaldo 10/20/2013, 9:29 AM

## 2013-10-20 NOTE — ED Provider Notes (Signed)
Medical screening examination/treatment/procedure(s) were performed by non-physician practitioner and as supervising physician I was immediately available for consultation/collaboration.   EKG Interpretation None        Gwyneth SproutWhitney Lavante Toso, MD 10/20/13 1547

## 2013-10-21 MED ORDER — CLINDAMYCIN HCL 300 MG PO CAPS
300.0000 mg | ORAL_CAPSULE | Freq: Four times a day (QID) | ORAL | Status: DC
  Filled 2013-10-21 (×3): qty 1

## 2013-10-21 MED ORDER — CLINDAMYCIN PHOSPHATE 600 MG/50ML IV SOLN
600.0000 mg | Freq: Three times a day (TID) | INTRAVENOUS | Status: DC
  Administered 2013-10-21 – 2013-10-23 (×5): 600 mg via INTRAVENOUS
  Filled 2013-10-21 (×8): qty 50

## 2013-10-21 NOTE — Consult Note (Signed)
Shirley [name] is an 23 y.o. female who was seen in my office approximately 1 week ago for consultation regarding painful impacted and non-restorable teeth. She was not scheduled for surgery at that time as her blood pressure required evaluation by her MD. She was admitted to [facility] for right submandibular abscess and has responded well to antibiotics.    Past Medical History  Diagnosis Date  . No significant past medical history   . Hypertension     History reviewed. No pertinent past surgical history.  No family history on file.  Social History:  reports that she has been smoking.  She does not have any smokeless tobacco history on file. She reports that she drinks alcohol. She reports that she does not use illicit drugs.  Allergies: No Known Allergies  Medications: I have reviewed the patient's current medications.  Results for orders placed during the hospital encounter of 10/17/13 (from the past 48 hour(s))  CBC     Status: Abnormal   Collection Time    10/20/13  5:00 AM      Result Value Ref Range   WBC 10.9 (*) 4.0 - 10.5 K/uL   RBC 4.17  3.87 - 5.11 MIL/uL   Hemoglobin 10.8 (*) 12.0 - 15.0 g/dL   HCT 33.6 (*) 36.0 - 46.0 %   MCV 80.6  78.0 - 100.0 fL   MCH 25.9 (*) 26.0 - 34.0 pg   MCHC 32.1  30.0 - 36.0 g/dL   RDW 14.1  11.5 - 15.5 %   Platelets 242  150 - 400 K/uL  BASIC METABOLIC PANEL     Status: None   Collection Time    10/20/13  5:00 AM      Result Value Ref Range   Sodium 137  137 - 147 mEq/L   Potassium 4.0  3.7 - 5.3 mEq/L   Chloride 102  96 - 112 mEq/L   CO2 23  19 - 32 mEq/L   Glucose, Bld 98  70 - 99 mg/dL   BUN 10  6 - 23 mg/dL   Creatinine, Ser 0.78  0.50 - 1.10 mg/dL   Calcium 8.7  8.4 - 10.5 mg/dL   GFR calc non Af Amer >90  >90 mL/min   GFR calc Af Amer >90  >90 mL/min   Comment: (NOTE)     The eGFR has been calculated using the CKD EPI equation.     This calculation has not been validated in all clinical situations.     eGFR's  persistently <90 mL/min signify possible Chronic Kidney     Disease.    Ct Soft Tissue Neck W Contrast  10/19/2013   CLINICAL DATA:  23 year old female with facial swelling thought related to right side dental infection. Increasing symptoms. Initial encounter.  EXAM: CT NECK WITH CONTRAST  TECHNIQUE: Multidetector CT imaging of the neck was performed using the standard protocol following the bolus administration of intravenous contrast.  CONTRAST:  68mL OMNIPAQUE IOHEXOL 300 MG/ML  SOLN  COMPARISON:  10/17/2013.  FINDINGS: Large body habitus. Grossly negative lung apices. Decreased soft tissue detail of the thoracic inlet. Grossly negative thyroid. Retropharyngeal space, parapharyngeal spaces, parotid spaces and major vascular structures in the neck appear stable and within normal limits.  Continued inflammation in the right submandibular space extending to the midline submental space with a central low-density collection measuring 12 x 10 x 11 mm. This is inseparable from the right submandibular gland which is inflamed. There is surrounding thickening  of the platysma and subcutaneous stranding. The central and anterior sublingual space remain spared as before. Reactive appearing right level 1 and level 2 lymph nodes re- identified. Prominent dental caries and periapical lucency of the posterior most right mandible molar (this appears to be the wisdom tooth, with the anterior molar previously extracted) re- identified with cortical breakthrough extending into the right anterior sublingual space (series 2, image 30).  Motion artifact affecting the pharynx. Lingual tonsil, tonsillar pillar and adenoid hypertrophy without tonsillar abscess. Visualized paranasal sinuses and mastoids are clear.  IMPRESSION: 1. Continued right submandibular and submental space infection with odontogenic source at the posterior right mandible. Low-density collection situated amongst a submandibular space phlegmon has slightly  decreased in size since 10/17/2013 (now 12 mm diameter, previously 15-19 mm). No other soft tissue space is affected. 2. Reactive right level 1 and level 2 nodes.   Electronically Signed   By: Lars Pinks M.D.   On: 10/19/2013 19:43    ROS Blood pressure 137/85, pulse 56, temperature 99.2 F (37.3 C), temperature source Oral, resp. rate 18, height $RemoveBe'5\' 7"'omwYHWRRd$  (1.702 m), weight 136.533 kg (301 lb), last menstrual period 09/17/2013, SpO2 100.00%. General appearance: alert, cooperative and morbidly obese Head: Normocephalic, without obvious abnormality, atraumatic Eyes: negative Nose: Nares normal. Septum midline. Mucosa normal. No drainage or sinus tenderness. Throat: decayed and impacted wisdom teeth. No trismus. No purulence or oral fluctuance.  Neck: no adenopathy, supple, symmetrical, trachea midline, thyroid not enlarged, symmetric, no tenderness/mass/nodules and Mild right submandibular induration. Non-fluctuant.  Assessment/Plan: Plan extraction teeth 1, 16, 17, 32 in am. If there are no surgical complications, patient should be stable for discharge home tomorrow.  Gae Bon 10/21/2013, 4:26 PM

## 2013-10-21 NOTE — Care Management Note (Addendum)
10-21-13 1153 CM did call the [facility]: Pt's Medicaid Card states designated PCP is the Clinic. CM did call to make sure pt will be able to get an appointment. Per office pt has not been seen in office since 2007 and she needs to be reestablished and fill out paperwork that is at the office. Per pt she has someone that is going by for paperwork.   Lamar LaundryBrenda Kaye SciotodaleGraves-Bigelow, RN,BSN [phone]

## 2013-10-21 NOTE — Care Management Note (Signed)
10-21-13 1451 CM did call the family Practice Clinic- and spoke to [name] with the clinic and we discussed f/u appointment for pt. Per Mrs. [name] pt will [facility]. Per [name], most patients would not be able to be [facility] and [facility]. No further needs from CM at this time. [name], RN, BSN [phone]

## 2013-10-21 NOTE — Progress Notes (Signed)
Subjective: Pt reports her neck pain has decreased.  Still c/o right neck swelling.  Objective: Vital signs in last 24 hours: Temp:  [97.7 F (36.5 C)-99.3 F (37.4 C)] 97.7 F (36.5 C) (04/23 14780614) Pulse Rate:  [59-77] 59 (04/23 0614) Resp:  [18] 18 (04/23 0614) BP: (129-183)/(81-101) 129/81 mmHg (04/23 0614) SpO2:  [96 %-100 %] 100 % (04/23 29560614)  Physical Exam  Constitutional: She is oriented to person, place, and time. She appears well-developed and well-nourished. No distress.  Head: Normocephalic and atraumatic.  Mouth/Throat: Dental caries present.  Swelling noted to the right submandibular area, area is mildly tender, indurated. No swelling under the tongue. Uvula and tonsils are symmetric.  Eyes: Conjunctivae and EOM are normal.  Neck: Normal range of motion. Neck supple. No tracheal deviation present. No stridor. Right submandibular area is slightly tender, indurated.  Cardiovascular: Normal rate, regular rhythm and normal heart sounds.  Neurological: She is alert and oriented to person, place, and time.  Skin: Skin is warm and dry.  Psychiatric: She has a normal mood and affect. Her behavior is normal.   Recent Labs  10/19/13 0550 10/20/13 0500  WBC 12.9* 10.9*  HGB 10.6* 10.8*  HCT 32.7* 33.6*  PLT 232 242    Recent Labs  10/19/13 0550 10/20/13 0500  NA 138 137  K 3.8 4.0  CL 103 102  CO2 24 23  GLUCOSE 80 98  BUN 5* 10  CREATININE 0.73 0.78  CALCIUM 8.6 8.7    Medications:  I have reviewed the patient's current medications. Scheduled: . clindamycin (CLEOCIN) IV  600 mg Intravenous 3 times per day  . enoxaparin (LOVENOX) injection  40 mg Subcutaneous Q24H  . sodium chloride  3 mL Intravenous Q12H   OZH:YQMVHQIONGEPRN:hydrALAZINE, morphine injection, ondansetron (ZOFRAN) IV, ondansetron, oxyCODONE  Assessment/Plan: Right submandibular odotogenic abscess.  Improving with abx treatment. Consider switching to oral clindamycin. If continues to improve, may d/c home  on clinda tomorrow.  Pt will need f/u with her dentist.  She may f/u at my office [phone] 1 week after discharge.    LOS: 4 days   Sui W Narya Beavin 10/21/2013, 12:58 PM

## 2013-10-21 NOTE — Progress Notes (Addendum)
Patient ID: Shirley [name]  female  ZOX:[id number]    DOB: June 01, 1991    DOA: 10/17/2013  PCP: Default, Provider, MD  Addendum:  Discussed with Dr. Ocie DoyneScott Jensen (oral dental surgeon), recommended to keep her n.p.o. after midnight for wisdom tooth extraction in a.m. - DC'd Lovenox - Changed back to IV clindamycin - N.p.o. after midnight - SCDs   Shirley [name] Jenna LuoK Clemie General M.D. [facility] 10/21/2013, 3:17 PM  Pager: [phone]     Assessment/Plan: Principal Problem:   Submandibular abscess - Repeat CT done yesterday showed some improvement in the submandibular abscess - Continue IV fluids, antiemetics, pain control, IV antibiotics - ENT, Dr Suszanne Connerseoh following, recommended to transition to oral antibiotics today , change to clindamycin 300 mg qid.  - She will need her right wisdom tooth extraction soon, patient [facility] Outpatient follow up with Dr. Suszanne Connerseoh  Active Problems:   OBESITY, MORBID - Counseled on diet and weight control    HYPERTENSION, BENIGN SYSTEMIC - Currently stable  DVT Prophylaxis:  Code Status:  Family Communication: Discussed with the patient at the bedside and her family member.  Disposition: DC home in a.m. if continues to remain stable  Consultants:  ENT  Procedures:  None  Antibiotics:  IV clindamycin  Subjective:  pain and swelling on the right neck is improving, tolerating soft diet  Objective: Weight change:   Intake/Output Summary (Last 24 hours) at 10/21/13 1445 Last data filed at 10/21/13 1343  Gross per 24 hour  Intake    960 ml  Output      0 ml  Net    960 ml   Blood pressure 137/85, pulse 56, temperature 99.2 F (37.3 C), temperature source Oral, resp. rate 18, height 5\' 7"  (1.702 m), weight 136.533 kg (301 lb), last menstrual period 09/17/2013, SpO2 100.00%.  Physical Exam: General: Alert and awake, oriented x3, not in any acute distress. HEENT: Submandibular area with swelling,  induration improving CVS: S1-S2 clear, no murmur rubs or gallops Chest: clear to auscultation bilaterally, no wheezing, rales or rhonchi Abdomen: soft nontender, nondistended, normal bowel sounds  Extremities: no cyanosis, clubbing or edema noted bilaterally   Lab Results: Basic Metabolic Panel:  Recent Labs Lab 10/19/13 0550 10/20/13 0500  NA 138 137  K 3.8 4.0  CL 103 102  CO2 24 23  GLUCOSE 80 98  BUN 5* 10  CREATININE 0.73 0.78  CALCIUM 8.6 8.7   Liver Function Tests:  Recent Labs Lab 10/18/13 0542  AST 13  ALT 11  ALKPHOS 139*  BILITOT 0.4  PROT 7.4  ALBUMIN 2.8*   No results found for this basename: LIPASE, AMYLASE,  in the last 168 hours No results found for this basename: AMMONIA,  in the last 168 hours CBC:  Recent Labs Lab 10/17/13 2009  10/19/13 0550 10/20/13 0500  WBC 15.7*  < > 12.9* 10.9*  NEUTROABS 11.2*  --   --   --   HGB 11.6*  < > 10.6* 10.8*  HCT 35.4*  < > 32.7* 33.6*  MCV 80.6  < > 80.5 80.6  PLT 254  < > 232 242  < > = values in this interval not displayed. Cardiac Enzymes: No results found for this basename: CKTOTAL, CKMB, CKMBINDEX, TROPONINI,  in the last 168 hours BNP: No components found with this basename: POCBNP,  CBG: No results found for this basename: GLUCAP,  in the last 168 hours   Micro Results: No results found  for this or any previous visit (from the past 240 hour(s)).  Studies/Results: Ct Soft Tissue Neck W Contrast  10/19/2013   CLINICAL DATA:  23 year old female with facial swelling thought related to right side dental infection. Increasing symptoms. Initial encounter.  EXAM: CT NECK WITH CONTRAST  TECHNIQUE: Multidetector CT imaging of the neck was performed using the standard protocol following the bolus administration of intravenous contrast.  CONTRAST:  75mL OMNIPAQUE IOHEXOL 300 MG/ML  SOLN  COMPARISON:  10/17/2013.  FINDINGS: Large body habitus. Grossly negative lung apices. Decreased soft tissue detail of  the thoracic inlet. Grossly negative thyroid. Retropharyngeal space, parapharyngeal spaces, parotid spaces and major vascular structures in the neck appear stable and within normal limits.  Continued inflammation in the right submandibular space extending to the midline submental space with a central low-density collection measuring 12 x 10 x 11 mm. This is inseparable from the right submandibular gland which is inflamed. There is surrounding thickening of the platysma and subcutaneous stranding. The central and anterior sublingual space remain spared as before. Reactive appearing right level 1 and level 2 lymph nodes re- identified. Prominent dental caries and periapical lucency of the posterior most right mandible molar (this appears to be the wisdom tooth, with the anterior molar previously extracted) re- identified with cortical breakthrough extending into the right anterior sublingual space (series 2, image 30).  Motion artifact affecting the pharynx. Lingual tonsil, tonsillar pillar and adenoid hypertrophy without tonsillar abscess. Visualized paranasal sinuses and mastoids are clear.  IMPRESSION: 1. Continued right submandibular and submental space infection with odontogenic source at the posterior right mandible. Low-density collection situated amongst a submandibular space phlegmon has slightly decreased in size since 10/17/2013 (now 12 mm diameter, previously 15-19 mm). No other soft tissue space is affected. 2. Reactive right level 1 and level 2 nodes.   Electronically Signed   By: Augusto Gamble M.D.   On: 10/19/2013 19:43   Ct Soft Tissue Neck W Contrast  10/17/2013   CLINICAL DATA:  Right lower molar pain going on for several days. Facial swelling and headache.  EXAM: CT NECK WITH CONTRAST  TECHNIQUE: Multidetector CT imaging of the neck was performed using the standard protocol following the bolus administration of intravenous contrast.  CONTRAST:  80mL OMNIPAQUE IOHEXOL 300 MG/ML  SOLN  COMPARISON:  CT  HEAD W/O CM dated 08/15/2012; DG CERVICAL SPINE COMPLETE dated 12/17/2007  FINDINGS: Technically limited study due to the patient's body habitus and significant streak artifact and multiple sections.  The tonsils demonstrate diffuse enlargement somewhat asymmetrically more prominent towards the right. This is likely representing inflammatory process. The right submandibular gland is enlarged with a central low-attenuation structure measuring 1.9 cm diameter. This is consistent with an abscess. Focal lucencies around the periapical region of the right third molar with large caries in that molar. Multiple additional tooth extractions and prominent dental caries noted elsewhere. Retention cyst in the left maxillary antrum. Paranasal sinuses are otherwise clear. Scattered cervical lymph nodes are not pathologically enlarged and are likely reactive. Visualized cervical spine demonstrate some reversal of the usual lordosis, likely positioning.  IMPRESSION: Poor dentition with large caries and periapical lucency along the right third molar suggesting periodontal disease. There is swelling in the right submandibular gland and and right greater than left tonsils with a focal fluid collection consistent with abscess centrally in the right submandibular gland.   Electronically Signed   By: Burman Nieves M.D.   On: 10/17/2013 21:48    Medications: Scheduled Meds: .  clindamycin  300 mg Oral 4 times per day  . enoxaparin (LOVENOX) injection  40 mg Subcutaneous Q24H  . sodium chloride  3 mL Intravenous Q12H      LOS: 4 days   Roselina Burgueno Jenna LuoK Polo Mcmartin M.D. [facility] 10/21/2013, 2:45 PM Pager: [phone]  If 7PM-7AM, please contact night-coverage [email or web address] Password [email or web address]  **Disclaimer: This note was dictated with voice recognition software. Similar sounding words can inadvertently be transcribed and this note may contain transcription errors which may not have been corrected upon publication of note.**

## 2013-10-22 ENCOUNTER — Encounter (HOSPITAL_COMMUNITY): Payer: Medicaid Other | Admitting: Anesthesiology

## 2013-10-22 ENCOUNTER — Encounter (HOSPITAL_COMMUNITY): Payer: Self-pay | Admitting: Anesthesiology

## 2013-10-22 ENCOUNTER — Encounter (HOSPITAL_COMMUNITY)
Admission: EM | Disposition: A | Discharge status: Home / Self Care | Payer: Self-pay | Source: Home / Self Care | Attending: Internal Medicine

## 2013-10-22 ENCOUNTER — Inpatient Hospital Stay (HOSPITAL_COMMUNITY): Payer: Medicaid Other | Admitting: Anesthesiology

## 2013-10-22 HISTORY — PX: TOOTH EXTRACTION: SHX859

## 2013-10-22 SURGERY — DENTAL RESTORATION/EXTRACTIONS
Anesthesia: General | Site: Mouth

## 2013-10-22 SURGERY — Surgical Case
Anesthesia: *Unknown

## 2013-10-22 MED ORDER — ONDANSETRON HCL 4 MG/2ML IJ SOLN
INTRAMUSCULAR | Status: DC | PRN
  Administered 2013-10-22: 4 mg via INTRAVENOUS

## 2013-10-22 MED ORDER — FENTANYL CITRATE 0.05 MG/ML IJ SOLN
INTRAMUSCULAR | Status: AC
  Filled 2013-10-22: qty 5

## 2013-10-22 MED ORDER — MIDAZOLAM HCL 5 MG/5ML IJ SOLN
INTRAMUSCULAR | Status: DC | PRN
  Administered 2013-10-22: 2 mg via INTRAVENOUS

## 2013-10-22 MED ORDER — LIDOCAINE HCL (CARDIAC) 20 MG/ML IV SOLN
INTRAVENOUS | Status: DC | PRN
  Administered 2013-10-22: 100 mg via INTRAVENOUS

## 2013-10-22 MED ORDER — PROMETHAZINE HCL 25 MG PO TABS: 25.0000 mg | ORAL_TABLET | Freq: Four times a day (QID) | ORAL | Status: DC | PRN

## 2013-10-22 MED ORDER — ONDANSETRON HCL 4 MG/2ML IJ SOLN
INTRAMUSCULAR | Status: AC
  Filled 2013-10-22: qty 2

## 2013-10-22 MED ORDER — LIDOCAINE-EPINEPHRINE 2 %-1:100000 IJ SOLN
INTRAMUSCULAR | Status: DC | PRN
  Administered 2013-10-22: 13 mL

## 2013-10-22 MED ORDER — MIDAZOLAM HCL 2 MG/2ML IJ SOLN
INTRAMUSCULAR | Status: AC
  Filled 2013-10-22: qty 2

## 2013-10-22 MED ORDER — SODIUM CHLORIDE 0.9 % IR SOLN
Status: DC | PRN
  Administered 2013-10-22: 1000 mL

## 2013-10-22 MED ORDER — PROPOFOL 10 MG/ML IV BOLUS
INTRAVENOUS | Status: AC
  Filled 2013-10-22: qty 20

## 2013-10-22 MED ORDER — 0.9 % SODIUM CHLORIDE (POUR BTL) OPTIME
TOPICAL | Status: DC | PRN
  Administered 2013-10-22: 1000 mL

## 2013-10-22 MED ORDER — CEFAZOLIN SODIUM-DEXTROSE 2-3 GM-% IV SOLR
INTRAVENOUS | Status: DC | PRN
  Administered 2013-10-22: 2 g via INTRAVENOUS

## 2013-10-22 MED ORDER — LIDOCAINE-EPINEPHRINE 2 %-1:100000 IJ SOLN
INTRAMUSCULAR | Status: AC
  Filled 2013-10-22: qty 1

## 2013-10-22 MED ORDER — CLINDAMYCIN HCL 300 MG PO CAPS: 300.0000 mg | ORAL_CAPSULE | Freq: Three times a day (TID) | ORAL | Status: DC

## 2013-10-22 MED ORDER — SODIUM CHLORIDE 0.9 % IV SOLN: INTRAVENOUS | Status: AC

## 2013-10-22 MED ORDER — PROPOFOL 10 MG/ML IV BOLUS
INTRAVENOUS | Status: DC | PRN
  Administered 2013-10-22: 50 mg via INTRAVENOUS
  Administered 2013-10-22: 200 mg via INTRAVENOUS

## 2013-10-22 MED ORDER — HYDROMORPHONE HCL PF 1 MG/ML IJ SOLN
0.2500 mg | INTRAMUSCULAR | Status: DC | PRN
Start: 2013-10-22 — End: 2013-10-22
  Administered 2013-10-22: 0.5 mg via INTRAVENOUS

## 2013-10-22 MED ORDER — SUCCINYLCHOLINE CHLORIDE 20 MG/ML IJ SOLN
INTRAMUSCULAR | Status: DC | PRN
  Administered 2013-10-22: 160 mg via INTRAVENOUS

## 2013-10-22 MED ORDER — CEFAZOLIN SODIUM-DEXTROSE 2-3 GM-% IV SOLR
INTRAVENOUS | Status: AC
  Filled 2013-10-22: qty 50

## 2013-10-22 MED ORDER — OXYMETAZOLINE HCL 0.05 % NA SOLN
NASAL | Status: AC
  Filled 2013-10-22: qty 15

## 2013-10-22 MED ORDER — FENTANYL CITRATE 0.05 MG/ML IJ SOLN
INTRAMUSCULAR | Status: DC | PRN
  Administered 2013-10-22 (×2): 100 ug via INTRAVENOUS
  Administered 2013-10-22: 50 ug via INTRAVENOUS

## 2013-10-22 MED ORDER — LIDOCAINE HCL (CARDIAC) 20 MG/ML IV SOLN
INTRAVENOUS | Status: AC
  Filled 2013-10-22: qty 5

## 2013-10-22 MED ORDER — LACTATED RINGERS IV SOLN
INTRAVENOUS | Status: DC | PRN
  Administered 2013-10-22: 07:00:00 via INTRAVENOUS

## 2013-10-22 MED ORDER — PROMETHAZINE HCL 25 MG/ML IJ SOLN: 6.2500 mg | INTRAMUSCULAR | Status: DC | PRN

## 2013-10-22 MED ORDER — HYDROMORPHONE HCL PF 1 MG/ML IJ SOLN
INTRAMUSCULAR | Status: AC
  Filled 2013-10-22: qty 1

## 2013-10-22 MED ORDER — ROCURONIUM BROMIDE 50 MG/5ML IV SOLN
INTRAVENOUS | Status: AC
  Filled 2013-10-22: qty 1

## 2013-10-22 MED ORDER — OXYCODONE HCL 5 MG PO TABS: 5.0000 mg | ORAL_TABLET | Freq: Four times a day (QID) | ORAL | Status: DC | PRN

## 2013-10-22 SURGICAL SUPPLY — 30 items
BUR CROSS CUT FISSURE 1.6 (BURR) ×1 IMPLANT
BUR CROSS CUT FISSURE 1.6MM (BURR) ×1
BUR EGG ELITE 4.0 (BURR) ×2 IMPLANT
BUR EGG ELITE 4.0MM (BURR) ×1
CANISTER SUCTION 2500CC (MISCELLANEOUS) ×3 IMPLANT
COVER SURGICAL LIGHT HANDLE (MISCELLANEOUS) ×3 IMPLANT
GAUZE PACKING FOLDED 2  STR (GAUZE/BANDAGES/DRESSINGS) ×2
GAUZE PACKING FOLDED 2 STR (GAUZE/BANDAGES/DRESSINGS) ×1 IMPLANT
GLOVE BIO SURGEON STRL SZ 6.5 (GLOVE) ×2 IMPLANT
GLOVE BIO SURGEON STRL SZ7.5 (GLOVE) ×3 IMPLANT
GLOVE BIO SURGEONS STRL SZ 6.5 (GLOVE) ×1
GLOVE BIOGEL PI IND STRL 7.0 (GLOVE) ×1 IMPLANT
GLOVE BIOGEL PI INDICATOR 7.0 (GLOVE) ×2
GOWN STRL REUS W/ TWL LRG LVL3 (GOWN DISPOSABLE) ×1 IMPLANT
GOWN STRL REUS W/ TWL XL LVL3 (GOWN DISPOSABLE) ×1 IMPLANT
GOWN STRL REUS W/TWL LRG LVL3 (GOWN DISPOSABLE) ×3
GOWN STRL REUS W/TWL XL LVL3 (GOWN DISPOSABLE) ×3
KIT BASIN OR (CUSTOM PROCEDURE TRAY) ×3 IMPLANT
KIT ROOM TURNOVER OR (KITS) ×3 IMPLANT
NEEDLE 22X1 1/2 (OR ONLY) (NEEDLE) ×3 IMPLANT
NS IRRIG 1000ML POUR BTL (IV SOLUTION) ×3 IMPLANT
PAD ARMBOARD 7.5X6 YLW CONV (MISCELLANEOUS) ×5 IMPLANT
SPONGE SURGIFOAM ABS GEL 12-7 (HEMOSTASIS) IMPLANT
SUT CHROMIC 3 0 PS 2 (SUTURE) ×2 IMPLANT
SYR CONTROL 10ML LL (SYRINGE) ×2 IMPLANT
TOWEL OR 17X24 6PK STRL BLUE (TOWEL DISPOSABLE) ×1 IMPLANT
TOWEL OR 17X26 10 PK STRL BLUE (TOWEL DISPOSABLE) ×2 IMPLANT
TRAY ENT MC OR (CUSTOM PROCEDURE TRAY) ×3 IMPLANT
TUBING IRRIGATION (MISCELLANEOUS) ×3 IMPLANT
YANKAUER SUCT BULB TIP NO VENT (SUCTIONS) ×3 IMPLANT

## 2013-10-22 NOTE — Anesthesia Postprocedure Evaluation (Signed)
Anesthesia Post Note  Patient: Shirley [name]  Procedure(s) Performed: Procedure(s) (LRB): DENTAL EXTRACTIONS TEETH #1, 16, 17, 32 (N/A)  Anesthesia type: general  Patient location: PACU  Post pain: Pain level controlled  Post assessment: Patient's Cardiovascular Status Stable  Last Vitals:  Filed Vitals:   10/22/13 0852  BP: 152/89  Pulse: 65  Temp:   Resp: 12    Post vital signs: Reviewed and stable  Level of consciousness: sedated  Complications: No apparent anesthesia complications

## 2013-10-22 NOTE — Op Note (Signed)
NAMJackey [name]:  Colee, Haily              ACCOUNT NO.:  [id number]  MEDICAL RECORD NO.:  [id number]  LOCATION:                                 FACILITY:  PHYSICIAN:  Georgia LopesScott M. Roseana Rhine, M.D.  DATE OF BIRTH:  22-Jan-1991  DATE OF PROCEDURE:  10/22/2013 DATE OF DISCHARGE:  10/23/2013                              OPERATIVE REPORT   PREOPERATIVE DIAGNOSIS:  Impacted and carious nonrestorable wisdom teeth #1, #16, #17, #32.  POSTOPERATIVE DIAGNOSIS:  Impacted and carious nonrestorable wisdom teeth #1, #16, #17, #32.  PROCEDURE:  Extraction of teeth #1, #16, #17, #32.  SURGEON:  Georgia LopesScott M. Gertrude Bucks, M.D.  ANESTHESIA:  General.  Dr. Wayland Denislaire Sanger attending.  PROCEDURE:  The patient was taken to the operating room, placed on the table in supine position.  General anesthesia was administered intravenously and an oral endotracheal tube was placed.  Then, the patient was draped for the procedure.  Time-out was performed.  The posterior pharynx was suctioned.  A throat pack was placed.  2% lidocaine 1:100,000 epinephrine was infiltrated in an inferior alveolar block on the right and left side, and buccal and palatal infiltration in the maxilla, total of 13 mL was utilized.  A bite block was placed in the right side of the mouth, and the left side was operated first.  A #15 blade was used to make an incision overlying tooth #17 and around tooth #16.  The periosteum was reflected from around these teeth with a periosteal elevator.  Bone was removed from around impacted tooth #17, with the Stryker handpiece under irrigation.  The tooth was sectioned and removed with a 301 elevator and rongeurs.  Then, the upper tooth was elevated with a 301 elevator and removed with the upper forceps.  The sockets were then curetted and irrigated, and the lower socket was closed with 3-0 chromic.  The bite block was repositioned to the other side of the mouth, and the endotracheal tube was repositioned as well, and  then a #15 blade was used to make an incision around teeth #1 and #32.  The periosteum was reflected.  The teeth were elevated and removed with the universal forceps.  The sockets were then curetted.  There was a fair amount of granulation tissue at the socket of #32, and this was curetted.  There was, however, no purulent material encountered.  The sockets were then irrigated.  A decision was made not to close the socket #32 in case there would be some drainage at some point.  Then, the oral cavity was inspected, irrigated, and suctioned.  Throat pack was removed.  The patient was awakened, taken to the recovery room, breathing spontaneously in good condition.  ESTIMATED BLOOD LOSS:  Minimal.  COMPLICATIONS:  None.  SPECIMENS:  None.     Georgia LopesScott M. Vicke Plotner, M.D.     SMJ/MEDQ  D:  10/22/2013  T:  10/22/2013  Job:  [id number]

## 2013-10-22 NOTE — Progress Notes (Signed)
Notified Dr. Krista BlueSinger on BP. Orders received to treat pain at this time.

## 2013-10-22 NOTE — Op Note (Signed)
10/17/2013 - 10/22/2013  7:56 AM  PATIENT:  Shirley [name]  23 y.o. female  PRE-OPERATIVE DIAGNOSIS:  IMPACTED/CARIOUS WISDOM TEETH  POST-OPERATIVE DIAGNOSIS:  SAME  PROCEDURE:  Procedure(s): DENTAL EXTRACTIONS TEETH #1, 5616, 1717, 32  SURGEON:  Surgeon(s): Georgia LopesScott M Amorie Rentz, DDS  ANESTHESIA:   local and general  EBL:  minimal  DRAINS: none   SPECIMEN:  No Specimen  COUNTS:  YES  PLAN OF CARE: Discharge to home after PACU  PATIENT DISPOSITION:  PACU - hemodynamically stable.   PROCEDURE DETAILS: Dictation #[id number]  Georgia LopesScott M. Alonia Dibuono, DMD 10/22/2013 7:56 AM

## 2013-10-22 NOTE — Progress Notes (Signed)
Subjective: Pt is s/p dental extraction.  No new c/o.  Objective: Vital signs in last 24 hours: Temp:  [97.1 F (36.2 C)-99.2 F (37.3 C)] 97.1 F (36.2 C) (04/24 0847) Pulse Rate:  [56-78] 65 (04/24 0852) Resp:  [12-20] 12 (04/24 0852) BP: (123-167)/(71-110) 152/89 mmHg (04/24 0852) SpO2:  [95 %-100 %] 96 % (04/24 0852)  Physical Exam  Constitutional: She is oriented to person, place, and time. She appears well-developed and well-nourished. No distress.  Head: Normocephalic and atraumatic.  Mouth/Throat: s/p dental extraction Swelling noted to the right submandibular area, area is mildly tender, indurated. No swelling under the tongue. Uvula and tonsils are symmetric.  Eyes: Conjunctivae and EOM are normal.  Neck: Normal range of motion. Neck supple. No tracheal deviation present. No stridor. Right submandibular area is slightly tender, indurated.  Cardiovascular: Normal rate, regular rhythm and normal heart sounds.  Neurological: She is alert and oriented to person, place, and time.  Skin: Skin is warm and dry.  Psychiatric: She has a normal mood and affect. Her behavior is normal.    Recent Labs  10/20/13 0500  WBC 10.9*  HGB 10.8*  HCT 33.6*  PLT 242    Recent Labs  10/20/13 0500  NA 137  K 4.0  CL 102  CO2 23  GLUCOSE 98  BUN 10  CREATININE 0.78  CALCIUM 8.7    Medications:  I have reviewed the patient's current medications. Scheduled: . clindamycin (CLEOCIN) IV  600 mg Intravenous 3 times per day  . HYDROmorphone       BJY:NWGNFAOZHYQPRN:hydrALAZINE, morphine injection, ondansetron (ZOFRAN) IV, ondansetron, oxyCODONE  Assessment/Plan: Right submandibular odotogenic abscess. Improving with abx treatment. Now s/p wisdom teeth extraction.  If she remains stable, may d/c home on clinda.  She may f/u at my office [phone]) 1 week after discharge.    LOS: 5 days   Shirley [name] 10/22/2013, 10:02 AM

## 2013-10-22 NOTE — Transfer of Care (Signed)
Immediate Anesthesia Transfer of Care Note  Patient: Shirley [name]  Procedure(s) Performed: Procedure(s): DENTAL EXTRACTIONS TEETH #1, 16, 8417, 2932 (N/A)  Patient Location: PACU  Anesthesia Type:General  Level of Consciousness: awake and alert   Airway & Oxygen Therapy: Patient Spontanous Breathing and Patient connected to face mask oxygen  Post-op Assessment: Report given to PACU RN and Post -op Vital signs reviewed and stable  Post vital signs: Reviewed and stable  Complications: No apparent anesthesia complications

## 2013-10-22 NOTE — H&P (Signed)
H&P documentation  -History and Physical Reviewed  -Patient has been re-examined  -No change in the plan of care  Desire Fulp M Mayerly Kaman  

## 2013-10-22 NOTE — Discharge Summary (Signed)
Physician Discharge Summary  Patient ID: Shirley [name] MRN: [id number] DOB/AGE: 1991/06/23 23 y.o.  Admit date: 10/17/2013 Discharge date: 10/22/2013  Primary Care Physician:  Default, Provider, MD  Discharge Diagnoses:    . Submandibular abscess   IMPACTED/CARIOUS WISDOM TEETH s/p DENTAL EXTRACTIONS TEETH #1, 16, 17, 32 on 4/24 . OBESITY, MORBID . HYPERTENSION, BENIGN SYSTEMIC  Consults: ENT, Dr. Suszanne Connerseoh                   Oral dental surgeon, Dr. Barbette MerinoJensen   Recommendations for Outpatient Follow-up:  Patient was requested to followup with Dr. Suszanne Connerseoh in 1 week  Allergies:  No Known Allergies   Discharge Medications:   Medication List         clindamycin 300 MG capsule  Commonly known as:  CLEOCIN  Take 1 capsule (300 mg total) by mouth 3 (three) times daily. X 10days     naproxen sodium 220 MG tablet  Commonly known as:  ANAPROX  Take 440 mg by mouth 2 (two) times daily as needed. For pain     oxyCODONE 5 MG immediate release tablet  Commonly known as:  Oxy IR/ROXICODONE  Take 1 tablet (5 mg total) by mouth every 6 (six) hours as needed for moderate pain.     promethazine 25 MG tablet  Commonly known as:  PHENERGAN  Take 1 tablet (25 mg total) by mouth every 6 (six) hours as needed for nausea or vomiting.         Brief H and P: For complete details please refer to admission H and P, but in brief Shirley [name] is a 23 y.o. female with Past medical history of hypertension and morbid obesity.  The patient presented with complaints of toothache on the right side on the lower jaw. She mentions on March 20 she saw her dentist because she was having a toothache for a while. Her dentist recommended removal of teeth. But due to her elevated blood pressure he refused for the procedure and the patient was also seen by oral surgeon for the same. Since last one week the patient has noted that there is progressive swelling of side of the lower face associated with pain and a  sensation of a lump when she swallowed.    Hospital Course:   Submandibular abscess : CT of the soft tissue neck on admission on 10/17/2013 showed poor dentition with large kidneys and periapical lucency along the right third molar suggesting periodontal disease, right sub-mandibular abscess. Patient was started on IV antibiotics, IV fluids and pain control. ENT was consulted and patient was followed by Dr. Suszanne Connerseoh. Repeat CT done 4/23 showed some improvement in the submandibular abscess oral dental surgery was also consulted, patient was seen by Dr. Barbette MerinoJensen and she underwent extraction of teeth #1, 16, 17, 32.  Patient will continue oral clindamycin for 10 days and followup with Dr. Suszanne Connerseoh and Dr Barbette MerinoJensen in office in one week.  OBESITY, MORBID - Counseled on diet and weight control   HYPERTENSION, BENIGN SYSTEMIC her elevated BP was likely due to submandibular abscess infection and toothache. For now she's not on any antihypertensives. She will be followed by outpatient community wellness clinic and if needed will be started on antihypertensives.    Day of Discharge BP 152/89  Pulse 65  Temp(Src) 97.1 F (36.2 C) (Oral)  Resp 12  Ht 5\' 7"  (1.702 m)  Wt 136.533 kg (301 lb)  BMI 47.13 kg/m2  SpO2 96%  LMP 09/17/2013  Physical Exam: General:  NAD, status post dental extraction  CVS: S1-S2 clear no murmur rubs or gallops Chest: clear to auscultation bilaterally, no wheezing rales or rhonchi Abdomen: soft nontender, nondistended, normal bowel sounds Extremities: no cyanosis, clubbing or edema noted bilaterally    The results of significant diagnostics from this hospitalization (including imaging, microbiology, ancillary and laboratory) are listed below for reference.    LAB RESULTS: Basic Metabolic Panel:  Recent Labs Lab 10/19/13 0550 10/20/13 0500  NA 138 137  K 3.8 4.0  CL 103 102  CO2 24 23  GLUCOSE 80 98  BUN 5* 10  CREATININE 0.73 0.78  CALCIUM 8.6 8.7   Liver Function  Tests:  Recent Labs Lab 10/18/13 0542  AST 13  ALT 11  ALKPHOS 139*  BILITOT 0.4  PROT 7.4  ALBUMIN 2.8*   No results found for this basename: LIPASE, AMYLASE,  in the last 168 hours No results found for this basename: AMMONIA,  in the last 168 hours CBC:  Recent Labs Lab 10/17/13 2009  10/19/13 0550 10/20/13 0500  WBC 15.7*  < > 12.9* 10.9*  NEUTROABS 11.2*  --   --   --   HGB 11.6*  < > 10.6* 10.8*  HCT 35.4*  < > 32.7* 33.6*  MCV 80.6  < > 80.5 80.6  PLT 254  < > 232 242  < > = values in this interval not displayed. Cardiac Enzymes: No results found for this basename: CKTOTAL, CKMB, CKMBINDEX, TROPONINI,  in the last 168 hours BNP: No components found with this basename: POCBNP,  CBG: No results found for this basename: GLUCAP,  in the last 168 hours  Significant Diagnostic Studies:  Ct Soft Tissue Neck W Contrast  10/17/2013   CLINICAL DATA:  Right lower molar pain going on for several days. Facial swelling and headache.  EXAM: CT NECK WITH CONTRAST  TECHNIQUE: Multidetector CT imaging of the neck was performed using the standard protocol following the bolus administration of intravenous contrast.  CONTRAST:  80mL OMNIPAQUE IOHEXOL 300 MG/ML  SOLN  COMPARISON:  CT HEAD W/O CM dated 08/15/2012; DG CERVICAL SPINE COMPLETE dated 12/17/2007  FINDINGS: Technically limited study due to the patient's body habitus and significant streak artifact and multiple sections.  The tonsils demonstrate diffuse enlargement somewhat asymmetrically more prominent towards the right. This is likely representing inflammatory process. The right submandibular gland is enlarged with a central low-attenuation structure measuring 1.9 cm diameter. This is consistent with an abscess. Focal lucencies around the periapical region of the right third molar with large caries in that molar. Multiple additional tooth extractions and prominent dental caries noted elsewhere. Retention cyst in the left maxillary  antrum. Paranasal sinuses are otherwise clear. Scattered cervical lymph nodes are not pathologically enlarged and are likely reactive. Visualized cervical spine demonstrate some reversal of the usual lordosis, likely positioning.  IMPRESSION: Poor dentition with large caries and periapical lucency along the right third molar suggesting periodontal disease. There is swelling in the right submandibular gland and and right greater than left tonsils with a focal fluid collection consistent with abscess centrally in the right submandibular gland.   Electronically Signed   By: Burman NievesWilliam  Stevens M.D.   On: 10/17/2013 21:48       Disposition and Follow-up:     Discharge Orders   Future Appointments Provider Department Dept Phone   11/03/2013 9:30 AM [facility] Covering Provider [facility] [phone]   Future Orders Complete By Expires  Discharge instructions  As directed    Increase activity slowly  As directed        DISPOSITION: Home  DIET:Soft diet    DISCHARGE FOLLOW-UP Follow-up Information   Follow up with [facility]     On 11/03/2013. [facility] f/u at 9:30 then pt will be established with the [facility])    Contact information:   [address] [place] [address] [phone]      Follow up with Darletta Moll, MD. Schedule an appointment as soon as possible for a visit in 1 week. (for hospital follow-up)    Specialty:  Otolaryngology   Contact information:   [address] [place] [address] [phone]       Time spent on Discharge: 35 minutes   Signed:   Zakhia Seres [name] M.D. [facility] 10/22/2013, 12:27 PM Pager: [phone]   **Disclaimer: This note was dictated with voice recognition software. Similar sounding words can inadvertently be transcribed and this note may contain transcription errors which may not have been corrected upon publication of note.**

## 2013-10-22 NOTE — Anesthesia Preprocedure Evaluation (Signed)
Anesthesia Evaluation    Reviewed: Allergy & Precautions, H&P , NPO status , Patient's Chart, lab work & pertinent test results  Airway       Dental   Pulmonary Current Smoker,          Cardiovascular hypertension, negative cardio ROS      Neuro/Psych negative neurological ROS  negative psych ROS   GI/Hepatic negative GI ROS, Neg liver ROS,   Endo/Other  Morbid obesity  Renal/GU negative Renal ROS     Musculoskeletal   Abdominal   Peds  Hematology   Anesthesia Other Findings   Reproductive/Obstetrics negative OB ROS                           Anesthesia Physical Anesthesia Plan  ASA: III  Anesthesia Plan: General ETT   Post-op Pain Management:    Induction:   Airway Management Planned:   Additional Equipment:   Intra-op Plan:   Post-operative Plan:   Informed Consent:   Plan Discussed with: Anesthesiologist, Surgeon and CRNA  Anesthesia Plan Comments:         Anesthesia Quick Evaluation

## 2013-10-22 NOTE — Addendum Note (Signed)
Addendum created 10/22/13 0902 by Rudi RummageHal J Leler Brion, CRNA   Modules edited: Anesthesia Flowsheet

## 2013-10-23 NOTE — Progress Notes (Signed)
Patient ID: Shirley [name]  female  ZOX:[id number]    DOB: 10/18/1990    DOA: 10/17/2013  PCP: Default, Provider, MD  Assessment/Plan: Principal Problem:   Submandibular abscess doing better today - s/p tooth extraction on 10/22/2013  - Doing well, continue oral clindamycin, pain control, nausea medicine as needed  Active Problems:   OBESITY, MORBID - Counseled on diet and weight control    HYPERTENSION, BENIGN SYSTEMIC - Currently stable  DVT Prophylaxis:  Code Status:  Family Communication:   Disposition: DC home   Consultants:  ENT  Procedures:  None  Antibiotics:  IV clindamycin  Subjective:  pain and swelling on the right neck is improving, and overnight issues  Objective: Weight change:  No intake or output data in the 24 hours ending 10/23/13 1229 Blood pressure 145/87, pulse 73, temperature 98 F (36.7 C), temperature source Oral, resp. rate 16, height 5\' 7"  (1.702 m), weight 136.533 kg (301 lb), last menstrual period 09/17/2013, SpO2 94.00%.  Physical Exam: General: Alert and awake, oriented x3, not in any acute distress. HEENT: Submandibular area with swelling, induration improving CVS: S1-S2 clear, no murmur rubs or gallops Chest: CTAB Abdomen: soft NT, ND, NBS  Extremities: no c/c/e bilaterally   Lab Results: Basic Metabolic Panel:  Recent Labs Lab 10/19/13 0550 10/20/13 0500  NA 138 137  K 3.8 4.0  CL 103 102  CO2 24 23  GLUCOSE 80 98  BUN 5* 10  CREATININE 0.73 0.78  CALCIUM 8.6 8.7   Liver Function Tests:  Recent Labs Lab 10/18/13 0542  AST 13  ALT 11  ALKPHOS 139*  BILITOT 0.4  PROT 7.4  ALBUMIN 2.8*   No results found for this basename: LIPASE, AMYLASE,  in the last 168 hours No results found for this basename: AMMONIA,  in the last 168 hours CBC:  Recent Labs Lab 10/17/13 2009  10/19/13 0550 10/20/13 0500  WBC 15.7*  < > 12.9* 10.9*  NEUTROABS 11.2*  --   --   --   HGB 11.6*  < > 10.6* 10.8*  HCT 35.4*   < > 32.7* 33.6*  MCV 80.6  < > 80.5 80.6  PLT 254  < > 232 242  < > = values in this interval not displayed. Cardiac Enzymes: No results found for this basename: CKTOTAL, CKMB, CKMBINDEX, TROPONINI,  in the last 168 hours BNP: No components found with this basename: POCBNP,  CBG: No results found for this basename: GLUCAP,  in the last 168 hours   Micro Results: No results found for this or any previous visit (from the past 240 hour(s)).  Studies/Results: Ct Soft Tissue Neck W Contrast  10/19/2013   CLINICAL DATA:  23 year old female with facial swelling thought related to right side dental infection. Increasing symptoms. Initial encounter.  EXAM: CT NECK WITH CONTRAST  TECHNIQUE: Multidetector CT imaging of the neck was performed using the standard protocol following the bolus administration of intravenous contrast.  CONTRAST:  75mL OMNIPAQUE IOHEXOL 300 MG/ML  SOLN  COMPARISON:  10/17/2013.  FINDINGS: Large body habitus. Grossly negative lung apices. Decreased soft tissue detail of the thoracic inlet. Grossly negative thyroid. Retropharyngeal space, parapharyngeal spaces, parotid spaces and major vascular structures in the neck appear stable and within normal limits.  Continued inflammation in the right submandibular space extending to the midline submental space with a central low-density collection measuring 12 x 10 x 11 mm. This is inseparable from the right submandibular gland which is inflamed. There is  surrounding thickening of the platysma and subcutaneous stranding. The central and anterior sublingual space remain spared as before. Reactive appearing right level 1 and level 2 lymph nodes re- identified. Prominent dental caries and periapical lucency of the posterior most right mandible molar (this appears to be the wisdom tooth, with the anterior molar previously extracted) re- identified with cortical breakthrough extending into the right anterior sublingual space (series 2, image 30).   Motion artifact affecting the pharynx. Lingual tonsil, tonsillar pillar and adenoid hypertrophy without tonsillar abscess. Visualized paranasal sinuses and mastoids are clear.  IMPRESSION: 1. Continued right submandibular and submental space infection with odontogenic source at the posterior right mandible. Low-density collection situated amongst a submandibular space phlegmon has slightly decreased in size since 10/17/2013 (now 12 mm diameter, previously 15-19 mm). No other soft tissue space is affected. 2. Reactive right level 1 and level 2 nodes.   Electronically Signed   By: Augusto GambleLee  Hall M.D.   On: 10/19/2013 19:43   Ct Soft Tissue Neck W Contrast  10/17/2013   CLINICAL DATA:  Right lower molar pain going on for several days. Facial swelling and headache.  EXAM: CT NECK WITH CONTRAST  TECHNIQUE: Multidetector CT imaging of the neck was performed using the standard protocol following the bolus administration of intravenous contrast.  CONTRAST:  80mL OMNIPAQUE IOHEXOL 300 MG/ML  SOLN  COMPARISON:  CT HEAD W/O CM dated 08/15/2012; DG CERVICAL SPINE COMPLETE dated 12/17/2007  FINDINGS: Technically limited study due to the patient's body habitus and significant streak artifact and multiple sections.  The tonsils demonstrate diffuse enlargement somewhat asymmetrically more prominent towards the right. This is likely representing inflammatory process. The right submandibular gland is enlarged with a central low-attenuation structure measuring 1.9 cm diameter. This is consistent with an abscess. Focal lucencies around the periapical region of the right third molar with large caries in that molar. Multiple additional tooth extractions and prominent dental caries noted elsewhere. Retention cyst in the left maxillary antrum. Paranasal sinuses are otherwise clear. Scattered cervical lymph nodes are not pathologically enlarged and are likely reactive. Visualized cervical spine demonstrate some reversal of the usual lordosis,  likely positioning.  IMPRESSION: Poor dentition with large caries and periapical lucency along the right third molar suggesting periodontal disease. There is swelling in the right submandibular gland and and right greater than left tonsils with a focal fluid collection consistent with abscess centrally in the right submandibular gland.   Electronically Signed   By: Burman NievesWilliam  Stevens M.D.   On: 10/17/2013 21:48    Medications: Scheduled Meds: . clindamycin (CLEOCIN) IV  600 mg Intravenous 3 times per day      LOS: 6 days   Shirley [name] Jenna LuoK Neena Beecham M.D. [facility] 10/23/2013, 12:29 PM Pager: [phone]  If 7PM-7AM, please contact night-coverage [email or web address] Password [email or web address]  **Disclaimer: This note was dictated with voice recognition software. Similar sounding words can inadvertently be transcribed and this note may contain transcription errors which may not have been corrected upon publication of note.**

## 2013-10-26 ENCOUNTER — Encounter (HOSPITAL_COMMUNITY): Payer: Self-pay | Admitting: Oral Surgery

## 2013-11-03 ENCOUNTER — Inpatient Hospital Stay: Payer: Medicaid Other

## 2014-02-17 ENCOUNTER — Emergency Department (HOSPITAL_COMMUNITY): Payer: No Typology Code available for payment source

## 2014-02-17 ENCOUNTER — Encounter (HOSPITAL_COMMUNITY): Payer: Self-pay | Admitting: Emergency Medicine

## 2014-02-17 ENCOUNTER — Emergency Department (HOSPITAL_COMMUNITY)
Admission: EM | Admit: 2014-02-17 | Discharge: 2014-02-17 | Disposition: A | Discharge status: Home / Self Care | Payer: No Typology Code available for payment source | Attending: Emergency Medicine | Admitting: Emergency Medicine

## 2014-02-17 DIAGNOSIS — S8990XA Unspecified injury of unspecified lower leg, initial encounter: Secondary | ICD-10-CM | POA: Diagnosis not present

## 2014-02-17 DIAGNOSIS — Y92410 Unspecified street and highway as the place of occurrence of the external cause: Secondary | ICD-10-CM | POA: Insufficient documentation

## 2014-02-17 DIAGNOSIS — I10 Essential (primary) hypertension: Secondary | ICD-10-CM | POA: Diagnosis not present

## 2014-02-17 DIAGNOSIS — F17200 Nicotine dependence, unspecified, uncomplicated: Secondary | ICD-10-CM | POA: Diagnosis not present

## 2014-02-17 DIAGNOSIS — Y9389 Activity, other specified: Secondary | ICD-10-CM | POA: Diagnosis not present

## 2014-02-17 DIAGNOSIS — Z792 Long term (current) use of antibiotics: Secondary | ICD-10-CM | POA: Diagnosis not present

## 2014-02-17 DIAGNOSIS — S99929A Unspecified injury of unspecified foot, initial encounter: Principal | ICD-10-CM

## 2014-02-17 DIAGNOSIS — S8991XA Unspecified injury of right lower leg, initial encounter: Secondary | ICD-10-CM

## 2014-02-17 DIAGNOSIS — S99919A Unspecified injury of unspecified ankle, initial encounter: Principal | ICD-10-CM
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
## 2014-02-22 NOTE — ED Provider Notes (Signed)
Medical screening examination/treatment/procedure(s) were performed by non-physician practitioner and as supervising physician I was immediately available for consultation/collaboration.   EKG Interpretation None       Naod Sweetland, MD 02/22/14 1605 

## 2014-06-28 ENCOUNTER — Encounter (HOSPITAL_COMMUNITY): Payer: Self-pay | Admitting: Emergency Medicine

## 2014-06-28 ENCOUNTER — Emergency Department (HOSPITAL_COMMUNITY): Payer: Medicaid Other

## 2014-06-28 ENCOUNTER — Emergency Department (HOSPITAL_COMMUNITY)
Admission: EM | Admit: 2014-06-28 | Discharge: 2014-06-28 | Disposition: A | Discharge status: Home / Self Care | Payer: Medicaid Other | Attending: Emergency Medicine | Admitting: Emergency Medicine

## 2014-06-28 DIAGNOSIS — I10 Essential (primary) hypertension: Secondary | ICD-10-CM | POA: Insufficient documentation

## 2014-06-28 DIAGNOSIS — Z720 Tobacco use: Secondary | ICD-10-CM | POA: Diagnosis not present

## 2014-06-28 DIAGNOSIS — X58XXXA Exposure to other specified factors, initial encounter: Secondary | ICD-10-CM | POA: Diagnosis not present

## 2014-06-28 DIAGNOSIS — Y9389 Activity, other specified: Secondary | ICD-10-CM | POA: Insufficient documentation

## 2014-06-28 DIAGNOSIS — Y9289 Other specified places as the place of occurrence of the external cause: Secondary | ICD-10-CM | POA: Insufficient documentation

## 2014-06-28 DIAGNOSIS — S99921A Unspecified injury of right foot, initial encounter: Secondary | ICD-10-CM | POA: Diagnosis present

## 2014-06-28 DIAGNOSIS — S93601A Unspecified sprain of right foot, initial encounter: Secondary | ICD-10-CM | POA: Diagnosis not present

## 2014-06-28 DIAGNOSIS — Y998 Other external cause status: Secondary | ICD-10-CM | POA: Diagnosis not present

## 2014-06-28 MED ORDER — MELOXICAM 7.5 MG PO TABS: 15.0000 mg | ORAL_TABLET | Freq: Every day | ORAL | Status: DC

## 2014-06-28 NOTE — ED Notes (Signed)
Patient here with complaint of right foot pain. States that she may have twisted it but is unsure of when the injury may have occurred. Dorsal aspect of right foot tender to palpation. Movement of toes does not increase pain but standing and walking does.

## 2014-06-28 NOTE — Discharge Instructions (Signed)
RICE: Routine Care for Injuries The routine care of many injuries includes Rest, Ice, Compression, and Elevation (RICE). HOME CARE INSTRUCTIONS  Rest is needed to allow your body to heal. Routine activities can usually be resumed when comfortable. Injured tendons and bones can take up to 6 weeks to heal. Tendons are the cord-like structures that attach muscle to bone.  Ice following an injury helps keep the swelling down and reduces pain.  Put ice in a plastic bag.  Place a towel between your skin and the bag.  Leave the ice on for 15-20 minutes, 3-4 times a day, or as directed by your health care provider. Do this while awake, for the first 24 to 48 hours. After that, continue as directed by your caregiver.  Compression helps keep swelling down. It also gives support and helps with discomfort. If an elastic bandage has been applied, it should be removed and reapplied every 3 to 4 hours. It should not be applied tightly, but firmly enough to keep swelling down. Watch fingers or toes for swelling, bluish discoloration, coldness, numbness, or excessive pain. If any of these problems occur, remove the bandage and reapply loosely. Contact your caregiver if these problems continue.  Elevation helps reduce swelling and decreases pain. With extremities, such as the arms, hands, legs, and feet, the injured area should be placed near or above the level of the heart, if possible. SEEK IMMEDIATE MEDICAL CARE IF:  You have persistent pain and swelling.  You develop redness, numbness, or unexpected weakness.  Your symptoms are getting worse rather than improving after several days. These symptoms may indicate that further evaluation or further X-rays are needed. Sometimes, X-rays may not show a small broken bone (fracture) until 1 week or 10 days later. Make a follow-up appointment with your caregiver. Ask when your X-ray results will be ready. Make sure you get your X-ray results. Document Released:  09/29/2000 Document Revised: 06/22/2013 Document Reviewed: 11/16/2010 ExitCare Patient Information 2015 [employer]. This information is not intended to replace advice given to you by your health care provider. Make sure you discuss any questions you have with your health care provider.  

## 2014-06-28 NOTE — ED Provider Notes (Signed)
CSN: [id number]     Arrival date & time 06/28/14  1913 History   First MD Initiated Contact with Patient 06/28/14 2006     Chief Complaint  Patient presents with  . Foot Pain    (Consider location/radiation/quality/duration/timing/severity/associated sxs/prior Treatment) HPI Comments: Patient is a 23 year old female with no significant past medical history who presents to the emergency department for further evaluation of right foot pain. Patient states that she was walking down the stairs 1 week ago when she twisted her foot resulting in her pain. She says the pain has been constant over the past week. She denies any worsening of symptoms, but does states that weightbearing aggravates her pain. Patient has tried soaking her foot in Epsom salt as well as ibuprofen for her pain. This has provided her no relief. She denies any associated numbness, weakness, inability to walk, or color change.  Patient is a 23 y.o. female presenting with lower extremity pain. The history is provided by the patient. No language interpreter was used.  Foot Pain Associated symptoms include arthralgias. Pertinent negatives include no fever, joint swelling or numbness.    Past Medical History  Diagnosis Date  . No significant past medical history   . Hypertension    Past Surgical History  Procedure Laterality Date  . Tooth extraction N/A 10/22/2013    Procedure: DENTAL EXTRACTIONS TEETH #1, 16, 17, 32;  Surgeon: Georgia LopesScott M Jensen, DDS;  Location: [facility] OR;  Service: Oral Surgery;  Laterality: N/A;   History reviewed. No pertinent family history. History  Substance Use Topics  . Smoking status: Current Every Day Smoker -- 1.00 packs/day  . Smokeless tobacco: Not on file  . Alcohol Use: Yes   OB History    No data available      Review of Systems  Constitutional: Negative for fever.  Musculoskeletal: Positive for arthralgias. Negative for joint swelling.  Skin: Negative for color change and pallor.    Neurological: Negative for numbness.  All other systems reviewed and are negative.   Allergies  Review of patient's allergies indicates no known allergies.  Home Medications   Prior to Admission medications   Medication Sig Start Date End Date Taking? Authorizing Provider  clindamycin (CLEOCIN) 300 MG capsule Take 1 capsule (300 mg total) by mouth 3 (three) times daily. X 10days Patient not taking: Reported on 06/28/2014 10/22/13   Ripudeep Jenna LuoK Rai, MD  meloxicam (MOBIC) 7.5 MG tablet Take 2 tablets (15 mg total) by mouth daily. 06/28/14   Antony MaduraKelly Amelianna Meller, PA-C  naproxen sodium (ANAPROX) 220 MG tablet Take 440 mg by mouth 2 (two) times daily as needed. For pain    Historical Provider, MD  oxyCODONE (OXY IR/ROXICODONE) 5 MG immediate release tablet Take 1 tablet (5 mg total) by mouth every 6 (six) hours as needed for moderate pain. Patient not taking: Reported on 06/28/2014 10/22/13   Ripudeep Jenna LuoK Rai, MD  promethazine (PHENERGAN) 25 MG tablet Take 1 tablet (25 mg total) by mouth every 6 (six) hours as needed for nausea or vomiting. Patient not taking: Reported on 06/28/2014 10/22/13   Ripudeep K Rai, MD   BP 177/115 mmHg  Pulse 87  Temp(Src) 97.9 F (36.6 C) (Oral)  Resp 18  Ht 5\' 7"  (1.702 m)  Wt 290 lb (131.543 kg)  BMI 45.41 kg/m2  SpO2 100%  LMP 05/30/2014   Physical Exam  Constitutional: She is oriented to person, place, and time. She appears well-developed and well-nourished. No distress.  Nontoxic/nonseptic appearing  HENT:  Head: Normocephalic and atraumatic.  Eyes: Conjunctivae and EOM are normal. No scleral icterus.  Neck: Normal range of motion.  Cardiovascular: Normal rate, regular rhythm and intact distal pulses.   DP and PT pulses 2+ in right lower extremity. Capillary refill brisk in all digits.  Pulmonary/Chest: Effort normal. No respiratory distress.  Musculoskeletal: Normal range of motion. She exhibits tenderness.       Right ankle: Normal.       Right foot: There  is tenderness. There is normal range of motion, no bony tenderness, no swelling, normal capillary refill, no crepitus and no deformity.       Feet:  Neurological: She is alert and oriented to person, place, and time. She exhibits normal muscle tone. Coordination normal.  Sensations to light touch intact. Patient able to wiggle all toes of right foot.  Skin: Skin is warm and dry. No rash noted. She is not diaphoretic. No erythema. No pallor.  Psychiatric: She has a normal mood and affect. Her behavior is normal.  Nursing note and vitals reviewed.   ED Course  Procedures (including critical care time) Labs Review Labs Reviewed - No data to display  Imaging Review Dg Foot Complete Right  06/28/2014   CLINICAL DATA:  23 year old female with dorsal and plantar foot pain following a twisting injury.  EXAM: RIGHT FOOT COMPLETE - 3+ VIEW  COMPARISON:  No priors.  FINDINGS: Multiple views of the right foot demonstrate no acute displaced fracture, subluxation, dislocation, or soft tissue abnormality.  IMPRESSION: No acute radiographic abnormality of the right foot.   Electronically Signed   By: Trudie Reedaniel  Entrikin M.D.   On: 06/28/2014 20:50     EKG Interpretation None      MDM   Final diagnoses:  Right foot sprain, initial encounter    23 year old female presents to the emergency department for further evaluation of right foot pain after a fall 1 week ago. Patient is neurovascularly intact. No evidence of septic joint, secondary infection, or cellulitis. Imaging today is negative for fracture, dislocation, or bony deformity. Symptoms consistent with right foot sprain. Patient given Ace wrap in ED. Have provided instructions for RICE as well as Mobic Rx. Orthopedic referral provided should symptoms persist and return precautions discussed. Patient agreeable to plan with no unaddressed concerns. Patient discharged in good condition.   Filed Vitals:   06/28/14 1926 06/28/14 2051  BP: 176/112  177/115  Pulse: 87 87  Temp: 97.6 F (36.4 C) 97.9 F (36.6 C)  TempSrc: Oral Oral  Resp: 16 18  Height: 5\' 7"  (1.702 m)   Weight: 290 lb (131.543 kg)   SpO2: 100% 100%     Antony MaduraKelly Gentri Guardado, PA-C 06/28/14 2112  Loren Raceravid Yelverton, MD 06/28/14 2337

## 2014-09-16 ENCOUNTER — Emergency Department (HOSPITAL_COMMUNITY)
Admission: EM | Admit: 2014-09-16 | Discharge: 2014-09-16 | Disposition: A | Discharge status: Home / Self Care | Payer: Medicaid Other | Attending: Emergency Medicine | Admitting: Emergency Medicine

## 2014-09-16 ENCOUNTER — Encounter (HOSPITAL_COMMUNITY): Payer: Self-pay | Admitting: Neurology

## 2014-09-16 DIAGNOSIS — Z792 Long term (current) use of antibiotics: Secondary | ICD-10-CM | POA: Diagnosis not present

## 2014-09-16 DIAGNOSIS — I10 Essential (primary) hypertension: Secondary | ICD-10-CM | POA: Insufficient documentation

## 2014-09-16 DIAGNOSIS — R531 Weakness: Secondary | ICD-10-CM | POA: Insufficient documentation

## 2014-09-16 DIAGNOSIS — Z720 Tobacco use: Secondary | ICD-10-CM | POA: Diagnosis not present

## 2014-09-16 DIAGNOSIS — J029 Acute pharyngitis, unspecified: Secondary | ICD-10-CM | POA: Diagnosis present

## 2014-09-16 DIAGNOSIS — Z79899 Other long term (current) drug therapy: Secondary | ICD-10-CM | POA: Insufficient documentation

## 2014-09-16 DIAGNOSIS — J020 Streptococcal pharyngitis: Secondary | ICD-10-CM

## 2014-09-16 DIAGNOSIS — Z791 Long term (current) use of non-steroidal anti-inflammatories (NSAID): Secondary | ICD-10-CM | POA: Diagnosis not present

## 2014-09-16 LAB — RAPID STREP SCREEN (MED CTR MEBANE ONLY): Streptococcus, Group A Screen (Direct): POSITIVE — AB

## 2014-09-16 MED ORDER — HYDROCODONE-ACETAMINOPHEN 5-325 MG PO TABS: 2.0000 | ORAL_TABLET | ORAL | Status: DC | PRN

## 2014-09-16 MED ORDER — HYDROCODONE-ACETAMINOPHEN 5-325 MG PO TABS
2.0000 | ORAL_TABLET | Freq: Once | ORAL | Status: AC
  Administered 2014-09-16: 2 via ORAL
  Filled 2014-09-16: qty 2

## 2014-09-16 MED ORDER — AMOXICILLIN 500 MG PO CAPS: 500.0000 mg | ORAL_CAPSULE | Freq: Three times a day (TID) | ORAL | Status: DC

## 2014-09-16 NOTE — Discharge Instructions (Signed)

## 2014-09-16 NOTE — ED Notes (Signed)
Pt reports sore throat for several days. Has having chills. Pt is a x 4

## 2014-09-16 NOTE — ED Provider Notes (Signed)
CSN: [id number]     Arrival date & time 09/16/14  1713 History  This chart was scribed for non-physician practitioner working with Rolland PorterMark James, MD by Murriel HopperAlec Bankhead, ED Scribe. This patient was seen in room TR08C/TR08C and the patient's care was started at 5:52 PM.     Chief Complaint  Patient presents with  . Sore Throat     HPI   HPI Comments: Shirley [name] is a 24 y.o. female who presents to the Emergency Department complaining of a constant, worsening sore throat with associated diaphoresis, chills, and weakness that has been present for a few days. Pt states that her child was sick earlier this week with similar symptoms. Pt states that she has high blood pressure but denies taking her blood pressure medication recently. Pt denies fever.     Past Medical History  Diagnosis Date  . No significant past medical history   . Hypertension    Past Surgical History  Procedure Laterality Date  . Tooth extraction N/A 10/22/2013    Procedure: DENTAL EXTRACTIONS TEETH #1, 16, 17, 32;  Surgeon: Georgia LopesScott [name] Jensen, DDS;  Location: [facility] OR;  Service: Oral Surgery;  Laterality: N/A;   No family history on file. History  Substance Use Topics  . Smoking status: Current Every Day Smoker -- 1.00 packs/day  . Smokeless tobacco: Not on file  . Alcohol Use: Yes   OB History    No data available     Review of Systems  Constitutional: Positive for chills and diaphoresis. Negative for fever.  HENT: Positive for sore throat.   Neurological: Positive for weakness.      Allergies  Review of patient's allergies indicates no known allergies.  Home Medications   Prior to Admission medications   Medication Sig Start Date End Date Taking? Authorizing Provider  naproxen sodium (ANAPROX) 220 MG tablet Take 440 mg by mouth 2 (two) times daily as needed. For pain   Yes Historical Provider, MD  clindamycin (CLEOCIN) 300 MG capsule Take 1 capsule (300 mg total) by mouth 3 (three) times daily. X  10days Patient not taking: Reported on 06/28/2014 10/22/13   Ripudeep Jenna LuoK Rai, MD  meloxicam (MOBIC) 7.5 MG tablet Take 2 tablets (15 mg total) by mouth daily. Patient not taking: Reported on 09/16/2014 06/28/14   Antony MaduraKelly Humes, PA-C  oxyCODONE (OXY IR/ROXICODONE) 5 MG immediate release tablet Take 1 tablet (5 mg total) by mouth every 6 (six) hours as needed for moderate pain. Patient not taking: Reported on 06/28/2014 10/22/13   Ripudeep Jenna LuoK Rai, MD  promethazine (PHENERGAN) 25 MG tablet Take 1 tablet (25 mg total) by mouth every 6 (six) hours as needed for nausea or vomiting. Patient not taking: Reported on 06/28/2014 10/22/13   Ripudeep K Rai, MD   BP 191/123 mmHg  Pulse 81  Temp(Src) 99.2 F (37.3 C) (Oral)  Resp 18  SpO2 95%  LMP 08/20/2014 Physical Exam  Constitutional: She is oriented to person, place, and time. She appears well-developed and well-nourished.  HENT:  Head: Normocephalic and atraumatic.  Oropharynx is moderately erythematous  No tonsillar exudates No abscess  No stridor  No evidence of airway obstruction   Cardiovascular: Normal rate, regular rhythm, normal heart sounds and intact distal pulses.  Exam reveals no gallop and no friction rub.   No murmur heard. Pulmonary/Chest: Effort normal and breath sounds normal. No respiratory distress. She has no wheezes. She has no rales. She exhibits no tenderness.  Abdominal: She exhibits no distension.  Neurological: She is alert and oriented to person, place, and time.  Skin: Skin is warm and dry.  Psychiatric: She has a normal mood and affect.  Nursing note and vitals reviewed.   ED Course  Procedures (including critical care time)  DIAGNOSTIC STUDIES: Oxygen Saturation is 95% on RA, normal by my interpretation.    COORDINATION OF CARE: 5:55 PM Discussed treatment plan with pt at bedside and pt agreed to plan.    Labs Review Labs Reviewed  RAPID STREP SCREEN    Imaging Review No results found.   EKG  Interpretation None      MDM   Final diagnoses:  None    Patient with strep throat. Will treat with amoxicillin. Blood pressure initially elevated likely partially due to pain. Patient encouraged follow-up with primary care provider regarding blood pressure. Airways intact.  I personally performed the services described in this documentation, which was scribed in my presence. The recorded information has been reviewed and is accurate.     Roxy Horseman, PA-C 09/16/14 1904  Rolland Porter, MD 09/17/14 2218

## 2014-09-16 NOTE — ED Notes (Signed)
Pt c/o malaise, chills and sweats, and weakness.    Pt sts daughter was sick 3-4 days ago.

## 2015-03-28 ENCOUNTER — Encounter (HOSPITAL_COMMUNITY): Payer: Self-pay

## 2015-03-28 ENCOUNTER — Inpatient Hospital Stay (HOSPITAL_COMMUNITY)
Admission: AD | Admit: 2015-03-28 | Discharge: 2015-03-28 | Disposition: A | Discharge status: Home / Self Care | Payer: Medicaid Other | Source: Ambulatory Visit | Attending: Family Medicine | Admitting: Family Medicine

## 2015-03-28 DIAGNOSIS — R1030 Lower abdominal pain, unspecified: Secondary | ICD-10-CM | POA: Insufficient documentation

## 2015-03-28 DIAGNOSIS — N72 Inflammatory disease of cervix uteri: Secondary | ICD-10-CM | POA: Diagnosis not present

## 2015-03-28 DIAGNOSIS — F17210 Nicotine dependence, cigarettes, uncomplicated: Secondary | ICD-10-CM | POA: Diagnosis not present

## 2015-03-28 LAB — URINALYSIS, ROUTINE W REFLEX MICROSCOPIC
Bilirubin Urine: NEGATIVE
GLUCOSE, UA: NEGATIVE mg/dL
HGB URINE DIPSTICK: NEGATIVE
KETONES UR: 15 mg/dL — AB
Leukocytes, UA: NEGATIVE
Nitrite: NEGATIVE
PH: 6 (ref 5.0–8.0)
PROTEIN: NEGATIVE mg/dL
Specific Gravity, Urine: 1.025 (ref 1.005–1.030)
Urobilinogen, UA: 1 mg/dL (ref 0.0–1.0)

## 2015-03-28 LAB — WET PREP, GENITAL
Trich, Wet Prep: NONE SEEN
Yeast Wet Prep HPF POC: NONE SEEN

## 2015-03-28 LAB — POCT PREGNANCY, URINE: Preg Test, Ur: NEGATIVE

## 2015-03-28 LAB — GC/CHLAMYDIA PROBE AMP (~~LOC~~) NOT AT ARMC
Chlamydia: NEGATIVE
Neisseria Gonorrhea: POSITIVE — AB

## 2015-03-28 MED ORDER — DOXYCYCLINE HYCLATE 100 MG PO CAPS: 100.0000 mg | ORAL_CAPSULE | Freq: Two times a day (BID) | ORAL | Status: DC

## 2015-03-28 MED ORDER — CEFTRIAXONE SODIUM 250 MG IJ SOLR
250.0000 mg | Freq: Once | INTRAMUSCULAR | Status: AC
  Administered 2015-03-28: 250 mg via INTRAMUSCULAR
  Filled 2015-03-28: qty 250

## 2015-03-28 NOTE — MAU Provider Note (Signed)
History     CSN: [id number]  Arrival date and time: 03/28/15 0127   None     Chief Complaint  Patient presents with  . Abdominal Pain   HPI Comments: Shirley [name] is a 24 y.o who presents today with lower abdominal pain. She states that she has had the pain off and on for a couple of days. She sates that aleve has helped. She looked up her symptoms online, and is concerned she has a pelvic organ prolapse.   Abdominal Pain This is a new problem. The current episode started yesterday. The onset quality is gradual. The problem occurs constantly. The problem has been gradually worsening. The pain is located in the suprapubic region. The pain is at a severity of 8/10. The quality of the pain is a sensation of fullness. The abdominal pain radiates to the pelvis and perineum. Associated symptoms include constipation (last BM 2 days ago ). Pertinent negatives include no diarrhea, dysuria, fever, frequency, nausea or vomiting. The pain is aggravated by coughing. The pain is relieved by nothing. Treatments tried: aleve  The treatment provided mild relief.   Past Medical History  Diagnosis Date  . No significant past medical history   . Hypertension     Past Surgical History  Procedure Laterality Date  . Tooth extraction N/A 10/22/2013    Procedure: DENTAL EXTRACTIONS TEETH #1, 16, 17, 32;  Surgeon: Georgia Lopes, DDS;  Location: [facility] OR;  Service: Oral Surgery;  Laterality: N/A;    No family history on file.  Social History  Substance Use Topics  . Smoking status: Current Every Day Smoker -- 1.00 packs/day  . Smokeless tobacco: Not on file  . Alcohol Use: Yes    Allergies: No Known Allergies  Prescriptions prior to admission  Medication Sig Dispense Refill Last Dose  . amoxicillin (AMOXIL) 500 MG capsule Take 1 capsule (500 mg total) by mouth 3 (three) times daily. 30 capsule 0   . clindamycin (CLEOCIN) 300 MG capsule Take 1 capsule (300 mg total) by mouth 3 (three) times daily.  X 10days (Patient not taking: Reported on 06/28/2014) 30 capsule 0 Not Taking at Unknown time  . HYDROcodone-acetaminophen (NORCO/VICODIN) 5-325 MG per tablet Take 2 tablets by mouth every 4 (four) hours as needed. 10 tablet 0   . meloxicam (MOBIC) 7.5 MG tablet Take 2 tablets (15 mg total) by mouth daily. (Patient not taking: Reported on 09/16/2014) 30 tablet 0 Not Taking at Unknown time  . naproxen sodium (ANAPROX) 220 MG tablet Take 440 mg by mouth 2 (two) times daily as needed. For pain   Past Week at Unknown time  . oxyCODONE (OXY IR/ROXICODONE) 5 MG immediate release tablet Take 1 tablet (5 mg total) by mouth every 6 (six) hours as needed for moderate pain. (Patient not taking: Reported on 06/28/2014) 30 tablet 0 Not Taking at Unknown time  . promethazine (PHENERGAN) 25 MG tablet Take 1 tablet (25 mg total) by mouth every 6 (six) hours as needed for nausea or vomiting. (Patient not taking: Reported on 06/28/2014) 30 tablet 0 Not Taking at Unknown time    Review of Systems  Constitutional: Negative for fever.  Gastrointestinal: Positive for abdominal pain and constipation (last BM 2 days ago ). Negative for nausea, vomiting and diarrhea.  Genitourinary: Negative for dysuria, urgency and frequency.   Physical Exam   Blood pressure 153/87, pulse 89, temperature 98.6 F (37 C), temperature source Oral, resp. rate 18, last menstrual period 03/21/2015.  Physical  Exam  Nursing note and vitals reviewed. Constitutional: She is oriented to person, place, and time. She appears well-developed and well-nourished. No distress.  HENT:  Head: Normocephalic.  Cardiovascular: Normal rate.   Respiratory: Effort normal.  GI: Soft. There is no tenderness. There is no rebound.  Genitourinary:   External: no lesion Vagina: small amount of white discharge Cervix: pink, smooth, + CMT  Uterus: NSSC Adnexa: NT   Neurological: She is alert and oriented to person, place, and time.  Skin: Skin is warm and  dry.  Psychiatric: She has a normal mood and affect.   Results for orders placed or performed during the hospital encounter of 03/28/15 (from the past 24 hour(s))  Urinalysis, Routine w reflex microscopic (not at [facility])     Status: Abnormal   Collection Time: 03/28/15  1:35 AM  Result Value Ref Range   Color, Urine YELLOW YELLOW   APPearance CLEAR CLEAR   Specific Gravity, Urine 1.025 1.005 - 1.030   pH 6.0 5.0 - 8.0   Glucose, UA NEGATIVE NEGATIVE mg/dL   Hgb urine dipstick NEGATIVE NEGATIVE   Bilirubin Urine NEGATIVE NEGATIVE   Ketones, ur 15 (A) NEGATIVE mg/dL   Protein, ur NEGATIVE NEGATIVE mg/dL   Urobilinogen, UA 1.0 0.0 - 1.0 mg/dL   Nitrite NEGATIVE NEGATIVE   Leukocytes, UA NEGATIVE NEGATIVE  Pregnancy, urine POC     Status: None   Collection Time: 03/28/15  1:46 AM  Result Value Ref Range   Preg Test, Ur NEGATIVE NEGATIVE  Wet prep, genital     Status: Abnormal   Collection Time: 03/28/15  2:00 AM  Result Value Ref Range   Yeast Wet Prep HPF POC NONE SEEN NONE SEEN   Trich, Wet Prep NONE SEEN NONE SEEN   Clue Cells Wet Prep HPF POC FEW (A) NONE SEEN   WBC, Wet Prep HPF POC MODERATE (A) NONE SEEN    MAU Course  Procedures  MDM 250 mg rocephin given IM here Will send home with doxycycline BID x 14 days   Assessment and Plan   1. Cervicitis    DC home Comfort measures reviewed  RX: doxycycline 100 mg BID x 14 days Return to MAU as needed   Follow-up Information    Follow up with [facility].   Specialty:  Obstetrics and Gynecology   Why:  they will call you with an appointment    Contact information:   [address] [place] [address] [phone]        Tawnya Crook 03/28/2015, 2:25 AM

## 2015-03-28 NOTE — Discharge Instructions (Signed)
Pelvic Inflammatory Disease °Pelvic inflammatory disease (PID) refers to an infection in some or all of the female organs. The infection can be in the uterus, ovaries, fallopian tubes, or the surrounding tissues in the pelvis. PID can cause abdominal or pelvic pain that comes on suddenly (acute pelvic pain). PID is a serious infection because it can lead to lasting (chronic) pelvic pain or the inability to have children (infertile).  °CAUSES  °The infection is often caused by the normal bacteria found in the vaginal tissues. PID may also be caused by an infection that is spread during sexual contact. PID can also occur following:  °· The birth of a baby.   °· A miscarriage.   °· An abortion.   °· Major pelvic surgery.   °· The use of an intrauterine device (IUD).   °· A sexual assault.   °RISK FACTORS °Certain factors can put a person at higher risk for PID, such as: °· Being younger than 25 years. °· Being sexually active at a young age. °· Using nonbarrier contraception. °· Having multiple sexual partners. °· Having sex with someone who has symptoms of a genital infection. °· Using oral contraception. °Other times, certain behaviors can increase the possibility of getting PID, such as: °· Having sex during your period. °· Using a vaginal douche. °· Having an intrauterine device (IUD) in place. °SYMPTOMS  °· Abdominal or pelvic pain.   °· Fever.   °· Chills.   °· Abnormal vaginal discharge. °· Abnormal uterine bleeding.   °· Unusual pain shortly after finishing your period. °DIAGNOSIS  °Your caregiver will choose some of the following methods to make a diagnosis, such as:  °· Performing a physical exam and history. A pelvic exam typically reveals a very tender uterus and surrounding pelvis.   °· Ordering laboratory tests including a pregnancy test, blood tests, and urine test.  °· Ordering cultures of the vagina and cervix to check for a sexually transmitted infection (STI). °· Performing an ultrasound.    °· Performing a laparoscopic procedure to look inside the pelvis.   °TREATMENT  °· Antibiotic medicines may be prescribed and taken by mouth.   °· Sexual partners may be treated when the infection is caused by a sexually transmitted disease (STD).   °· Hospitalization may be needed to give antibiotics intravenously. °· Surgery may be needed, but this is rare. °It may take weeks until you are completely well. If you are diagnosed with PID, you should also be checked for human immunodeficiency virus (HIV).   °HOME CARE INSTRUCTIONS  °· If given, take your antibiotics as directed. Finish the medicine even if you start to feel better.   °· Only take over-the-counter or prescription medicines for pain, discomfort, or fever as directed by your caregiver.   °· Do not have sexual intercourse until treatment is completed or as directed by your caregiver. If PID is confirmed, your recent sexual partner(s) will need treatment.   °· Keep your follow-up appointments. °SEEK MEDICAL CARE IF:  °· You have increased or abnormal vaginal discharge.   °· You need prescription medicine for your pain.   °· You vomit.   °· You cannot take your medicines.   °· Your partner has an STD.   °SEEK IMMEDIATE MEDICAL CARE IF:  °· You have a fever.   °· You have increased abdominal or pelvic pain.   °· You have chills.   °· You have pain when you urinate.   °· You are not better after 72 hours following treatment.   °MAKE SURE YOU:  °· Understand these instructions. °· Will watch your condition. °· Will get help right away if you are not doing well or get worse. °  Document Released: 06/17/2005 Document Revised: 10/12/2012 Document Reviewed: 06/13/2011 °ExitCare® Patient Information ©2015 [employer]. This information is not intended to replace advice given to you by your health care provider. Make sure you discuss any questions you have with your health care provider. ° °

## 2015-03-28 NOTE — MAU Note (Signed)
Pt states she was diagnosed with hypertension but never took the medicine prescribed to her.

## 2015-03-28 NOTE — MAU Note (Signed)
Pt presents complaining of lower abdominal pain and pressure. States she is constipated and hasn't had a bowel movement in 2 days. States she googled it and thinks she has pelvic organ prolapse so she thought she needed to come to the emergency room. Denies vaginal bleeding or discharge. LMP 9/20

## 2015-04-19 ENCOUNTER — Ambulatory Visit: Payer: Medicaid Other | Admitting: Obstetrics & Gynecology

## 2015-04-28 ENCOUNTER — Ambulatory Visit: Payer: Medicaid Other | Admitting: Obstetrics & Gynecology

## 2015-09-15 ENCOUNTER — Encounter (HOSPITAL_COMMUNITY): Payer: Self-pay | Admitting: Emergency Medicine

## 2015-09-15 ENCOUNTER — Emergency Department (HOSPITAL_COMMUNITY): Payer: Medicaid Other

## 2015-09-15 ENCOUNTER — Observation Stay (HOSPITAL_COMMUNITY)
Admission: EM | Admit: 2015-09-15 | Discharge: 2015-09-17 | Disposition: A | Discharge status: Home / Self Care | Payer: Medicaid Other | Attending: Family Medicine | Admitting: Family Medicine

## 2015-09-15 DIAGNOSIS — M5416 Radiculopathy, lumbar region: Secondary | ICD-10-CM

## 2015-09-15 DIAGNOSIS — N179 Acute kidney failure, unspecified: Secondary | ICD-10-CM | POA: Diagnosis not present

## 2015-09-15 DIAGNOSIS — M5441 Lumbago with sciatica, right side: Secondary | ICD-10-CM | POA: Diagnosis present

## 2015-09-15 DIAGNOSIS — M4806 Spinal stenosis, lumbar region: Secondary | ICD-10-CM | POA: Diagnosis not present

## 2015-09-15 DIAGNOSIS — M48061 Spinal stenosis, lumbar region without neurogenic claudication: Secondary | ICD-10-CM | POA: Diagnosis present

## 2015-09-15 DIAGNOSIS — I160 Hypertensive urgency: Secondary | ICD-10-CM | POA: Insufficient documentation

## 2015-09-15 DIAGNOSIS — M25361 Other instability, right knee: Secondary | ICD-10-CM | POA: Diagnosis not present

## 2015-09-15 DIAGNOSIS — I10 Essential (primary) hypertension: Secondary | ICD-10-CM | POA: Insufficient documentation

## 2015-09-15 DIAGNOSIS — M25562 Pain in left knee: Secondary | ICD-10-CM | POA: Diagnosis not present

## 2015-09-15 DIAGNOSIS — M25362 Other instability, left knee: Secondary | ICD-10-CM | POA: Diagnosis not present

## 2015-09-15 DIAGNOSIS — M25561 Pain in right knee: Secondary | ICD-10-CM | POA: Diagnosis not present

## 2015-09-15 DIAGNOSIS — M5430 Sciatica, unspecified side: Secondary | ICD-10-CM | POA: Diagnosis present

## 2015-09-15 DIAGNOSIS — M545 Low back pain: Secondary | ICD-10-CM | POA: Diagnosis present

## 2015-09-15 DIAGNOSIS — Z9114 Patient's other noncompliance with medication regimen: Secondary | ICD-10-CM | POA: Diagnosis not present

## 2015-09-15 DIAGNOSIS — Z6841 Body Mass Index (BMI) 40.0 and over, adult: Secondary | ICD-10-CM | POA: Insufficient documentation

## 2015-09-15 DIAGNOSIS — F17210 Nicotine dependence, cigarettes, uncomplicated: Secondary | ICD-10-CM | POA: Diagnosis not present

## 2015-09-15 DIAGNOSIS — M5116 Intervertebral disc disorders with radiculopathy, lumbar region: Principal | ICD-10-CM | POA: Insufficient documentation

## 2015-09-15 DIAGNOSIS — M549 Dorsalgia, unspecified: Secondary | ICD-10-CM

## 2015-09-15 HISTORY — DX: Lumbago with sciatica, right side: M54.41

## 2015-09-15 LAB — URINALYSIS, ROUTINE W REFLEX MICROSCOPIC
BILIRUBIN URINE: NEGATIVE
Glucose, UA: NEGATIVE mg/dL
Hgb urine dipstick: NEGATIVE
Ketones, ur: NEGATIVE mg/dL
Nitrite: NEGATIVE
PH: 6 (ref 5.0–8.0)
Protein, ur: NEGATIVE mg/dL
Specific Gravity, Urine: 1.023 (ref 1.005–1.030)

## 2015-09-15 LAB — CBC
HEMATOCRIT: 38.9 % (ref 36.0–46.0)
HEMOGLOBIN: 12.4 g/dL (ref 12.0–15.0)
MCH: 26.4 pg (ref 26.0–34.0)
MCHC: 31.9 g/dL (ref 30.0–36.0)
MCV: 82.8 fL (ref 78.0–100.0)
Platelets: 257 10*3/uL (ref 150–400)
RBC: 4.7 MIL/uL (ref 3.87–5.11)
RDW: 14.9 % (ref 11.5–15.5)
WBC: 11.9 10*3/uL — ABNORMAL HIGH (ref 4.0–10.5)

## 2015-09-15 LAB — URINE MICROSCOPIC-ADD ON

## 2015-09-15 LAB — BASIC METABOLIC PANEL
ANION GAP: 7 (ref 5–15)
BUN: 16 mg/dL (ref 6–20)
CO2: 26 mmol/L (ref 22–32)
Calcium: 9.2 mg/dL (ref 8.9–10.3)
Chloride: 105 mmol/L (ref 101–111)
Creatinine, Ser: 1.01 mg/dL — ABNORMAL HIGH (ref 0.44–1.00)
GFR calc Af Amer: >60 mL/min (ref >60)
GFR calc non Af Amer: >60 mL/min (ref >60)
Glucose, Bld: 92 mg/dL (ref 65–99)
POTASSIUM: 4.2 mmol/L (ref 3.5–5.1)
Sodium: 138 mmol/L (ref 135–145)

## 2015-09-15 LAB — POC URINE PREG, ED: Preg Test, Ur: NEGATIVE

## 2015-09-15 MED ORDER — KETOROLAC TROMETHAMINE 30 MG/ML IJ SOLN
30.0000 mg | Freq: Once | INTRAMUSCULAR | Status: AC
  Administered 2015-09-15: 30 mg via INTRAVENOUS
  Filled 2015-09-15: qty 1

## 2015-09-15 MED ORDER — ONDANSETRON HCL 4 MG/2ML IJ SOLN
4.0000 mg | Freq: Three times a day (TID) | INTRAMUSCULAR | Status: AC | PRN
Start: 2015-09-15 — End: 2015-09-16

## 2015-09-15 MED ORDER — PREDNISONE 20 MG PO TABS
60.0000 mg | ORAL_TABLET | Freq: Once | ORAL | Status: AC
  Administered 2015-09-15: 60 mg via ORAL
  Filled 2015-09-15: qty 3

## 2015-09-15 MED ORDER — DIAZEPAM 5 MG/ML IJ SOLN
2.5000 mg | Freq: Once | INTRAMUSCULAR | Status: AC
  Administered 2015-09-15: 2.5 mg via INTRAVENOUS
  Filled 2015-09-15: qty 2

## 2015-09-15 MED ORDER — HYDRALAZINE HCL 20 MG/ML IJ SOLN: 5.0000 mg | INTRAMUSCULAR | Status: DC | PRN

## 2015-09-15 MED ORDER — SODIUM CHLORIDE 0.9 % IV SOLN
INTRAVENOUS | Status: DC
  Administered 2015-09-15: 23:00:00 via INTRAVENOUS

## 2015-09-15 MED ORDER — ACETAMINOPHEN 650 MG RE SUPP: 650.0000 mg | Freq: Four times a day (QID) | RECTAL | Status: DC | PRN

## 2015-09-15 MED ORDER — POLYETHYLENE GLYCOL 3350 17 G PO PACK: 17.0000 g | PACK | Freq: Every day | ORAL | Status: DC | PRN

## 2015-09-15 MED ORDER — HYDROMORPHONE HCL 1 MG/ML IJ SOLN
1.0000 mg | Freq: Once | INTRAMUSCULAR | Status: AC
  Administered 2015-09-15: 1 mg via INTRAVENOUS
  Filled 2015-09-15: qty 1

## 2015-09-15 MED ORDER — HYDROCHLOROTHIAZIDE 12.5 MG PO CAPS
12.5000 mg | ORAL_CAPSULE | Freq: Every day | ORAL | Status: DC
  Administered 2015-09-15 – 2015-09-17 (×3): 12.5 mg via ORAL
  Filled 2015-09-15 (×3): qty 1

## 2015-09-15 MED ORDER — CYCLOBENZAPRINE HCL 10 MG PO TABS
10.0000 mg | ORAL_TABLET | Freq: Three times a day (TID) | ORAL | Status: DC
  Administered 2015-09-15 – 2015-09-17 (×5): 10 mg via ORAL
  Filled 2015-09-15 (×5): qty 1

## 2015-09-15 MED ORDER — DIAZEPAM 5 MG/ML IJ SOLN
5.0000 mg | Freq: Once | INTRAMUSCULAR | Status: AC
  Administered 2015-09-15: 5 mg via INTRAVENOUS
  Filled 2015-09-15: qty 2

## 2015-09-15 MED ORDER — ONDANSETRON HCL 4 MG/2ML IJ SOLN: 4.0000 mg | Freq: Once | INTRAMUSCULAR | Status: DC

## 2015-09-15 MED ORDER — HYDROMORPHONE HCL 1 MG/ML IJ SOLN
1.0000 mg | INTRAMUSCULAR | Status: DC | PRN
  Administered 2015-09-15 (×2): 1 mg via INTRAVENOUS
  Filled 2015-09-15 (×2): qty 1

## 2015-09-15 MED ORDER — ACETAMINOPHEN 325 MG PO TABS: 650.0000 mg | ORAL_TABLET | Freq: Four times a day (QID) | ORAL | Status: DC | PRN

## 2015-09-15 MED ORDER — ENOXAPARIN SODIUM 80 MG/0.8ML ~~LOC~~ SOLN
70.0000 mg | SUBCUTANEOUS | Status: DC
  Administered 2015-09-15: 70 mg via SUBCUTANEOUS
  Filled 2015-09-15 (×2): qty 0.8

## 2015-09-15 MED ORDER — OXYCODONE HCL 5 MG PO TABS
5.0000 mg | ORAL_TABLET | ORAL | Status: DC | PRN
  Administered 2015-09-16 – 2015-09-17 (×2): 5 mg via ORAL
  Filled 2015-09-15 (×2): qty 1

## 2015-09-15 MED ORDER — MORPHINE SULFATE (PF) 4 MG/ML IV SOLN
4.0000 mg | Freq: Once | INTRAVENOUS | Status: AC
  Administered 2015-09-15: 4 mg via INTRAVENOUS
  Filled 2015-09-15: qty 1

## 2015-09-15 MED ORDER — SODIUM CHLORIDE 0.9 % IV BOLUS (SEPSIS)
1000.0000 mL | Freq: Once | INTRAVENOUS | Status: AC
  Administered 2015-09-15: 1000 mL via INTRAVENOUS

## 2015-09-15 NOTE — ED Notes (Signed)
Patient stated drove to hospital but will call for ride at discharge.

## 2015-09-15 NOTE — ED Notes (Signed)
Patient states lower back pain that started last night.  Patient states she thinks she has "spasms" in her back.   Patient states small amount of urine when she tries to go.   Patient states no relief.

## 2015-09-15 NOTE — ED Notes (Signed)
Provider at bedside

## 2015-09-15 NOTE — H&P (Signed)
Family Medicine Teaching [facility] Admission History and Physical Service Pager: [phone]  Patient name: Shirley [name] Medical record number: [id number] Date of birth: 05-03-1991 Age: 25 y.o. Gender: female  Primary Care Provider: No PCP Per Patient Consultants: None  Code Status: Full  Chief Complaint: Low back pain  Assessment and Plan: Shirley [name] is a 25 y.o. female presenting with low back pain with sciatica. PMH is significant for morbid obesity, recurrent boils, knee pain, and hypertension.  # Low back pain with sciatica: CT findings suggestive of moderate/severe foramen stenosis at L5-S1. Suspect secondary to disc herniation. No evidence of spondylolisthesis and CT. Patient denies any red flag symptoms at this time. No history of trauma/injury. Bladder scan yielded 125 ML's of urine. Physical exam yielded radicular pain consistent with CT findings. No weakness or reflex changes suggesting need for aggressive decompression. Will treat conservatively at this time. - Admit to observation for medicine teaching service; attending physician Dr. Deirdre Priest - Pain control with oral narcotics; oxycodone 5 mg every 4 hours when necessary. - Prednisone 60 mg daily - Patient provided Toradol in ED; today's creatinine shows mild AKI and BP is elevated. Will hold on all NSAIDs until this improves. - Flexeril 10 mg every 8 hours scheduled. - Strong consideration for further imaging with MRI; will hold off for now. - Nursing to ambulate patient to chair - PT/OT to ambulate halls in a.m. - Reassess pain in morning  # Hypertension: Patient endorses a 10 year history of hypertension which she admits to noncompliance with all medications. Notable elevated blood pressures in ED. Suspect pain to be primary cause for the severity of elevations however cannot rule out chronic essential hypertension. Patient denies any chest pain, blurred vision, headache, peripheral edema, or shortness of  breath. - Initiate hydrochlorothiazide 12.5 mg daily. - Hydralazine 5 mg every 4 hours when necessary for blood pressures >180/>110 - Will monitor - Will likely require outpatient prescription for antihypertensives.  # Acute kidney injury; mild: Patient's baseline creatinine around 0.73. On admission creatinine 1.01. Suspect secondary to hypertension versus dehydration. UA without strong concern for UTI (plus asymptomatic). Bladder scan yielded no evidence of urine retention. - IV fluids at near maintenance - Holding all NSAIDs; however, patient received 1 dose of Toradol in ED. - Blood pressure control as above - Reassess with a.m. labs  # Morbid obesity - Likely highly contributory to patient's low back pain and possible disc herniation.   FEN/GI: Heart healthy/carb modified diet. Normal saline at 151ml/hr Prophylaxis: Lovenox  Disposition: Home; anticipated 09/16/15  History of Present Illness:  Shirley [name] is a 25 y.o. female presenting with bilateral low back pain 1 day. Patient states that yesterday afternoon she had been sitting with her daughter for multiple hours working on a project. Shortly thereafter she had significant pain upon standing up. She reports pain of 10 out of 10 with radiation down her right leg to her foot. Patient endorses her most severe pain in her lumbar spine, and described this pain as "spasms". Patient states she was unable to walk or even sit up without exacerbating this pain. Ambulation was all but impossible per the patient. However, she does deny any numbness, weakness, or paresthesias affecting her gait and that she was unable to ambulate secondary only due to pain. Patient denied any urinary or bowel incontinence, saddle anesthesia, or urinary retention. Patient also denies any history of trauma or injury.  Patient was held in the ED with attempts to control  her pain/discomfort for over 6 hours. She was provided a total of 10 mg of Valium, 2 mg of  Dilaudid, 4 mg of morphine, 30 mg of Toradol, and 60 mg of prednisone. Patient continued to endorse pain 8 out of 10. CT was obtained which showed mild disc bulge at L5/S1. 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 Patient was noted to be hypertensive. She states that she had been told she had hypertension in the past but had never been compliant with any medications. Blood pressure on admission was 179/97. At that time the decision was made to admit patient for observation, hypertensive control, and pain control.  Review Of Systems: Per HPI plus: Denied fever, chills, headache, confusion, blurry vision, shortness of breath, chest pain, nausea, vomiting, diarrhea, dysuria, peripheral edema, or abdominal pain. Otherwise the remainder of the systems were negative.  Patient Active Problem List   Diagnosis Date Noted  . Low back pain with right-sided sciatica 09/15/2015  . Abscess of submandibular gland 10/18/2013  . Submandibular abscess 10/18/2013  . No significant past medical history   . OBESITY, MORBID 12/02/2006  . BOILS, RECURRENT 10/21/2006  . KNEE PAIN 09/04/2006  . HYPERTENSION, BENIGN SYSTEMIC 08/28/2006  . ACNE 08/28/2006    Past Medical History: Past Medical History  Diagnosis Date  . No significant past medical history   . Hypertension     Past Surgical History: Past Surgical History  Procedure Laterality Date  . Tooth extraction N/A 10/22/2013    Procedure: DENTAL EXTRACTIONS TEETH #1, 16, 17, 32;  Surgeon: Georgia Lopes, DDS;  Location: [facility] OR;  Service: Oral Surgery;  Laterality: N/A;    Social History: Social History  Substance Use Topics  . Smoking status: Current Every Day Smoker -- 0.00 packs/day  . Smokeless tobacco: Never Used  . Alcohol Use: Yes     Comment: rare   Additional social history: None  Please also refer to relevant sections of EMR.  Family History: Family History   Problem Relation Age of Onset  . Hypertension Mother     Allergies and Medications: No Known Allergies No current facility-administered medications on file prior to encounter.   Current Outpatient Prescriptions on File Prior to Encounter  Medication Sig Dispense Refill  . doxycycline (VIBRAMYCIN) 100 MG capsule Take 1 capsule (100 mg total) by mouth 2 (two) times daily. (Patient not taking: Reported on 09/15/2015) 28 capsule 0  . HYDROcodone-acetaminophen (NORCO/VICODIN) 5-325 MG per tablet Take 2 tablets by mouth every 4 (four) hours as needed. (Patient not taking: Reported on 09/15/2015) 10 tablet 0  . meloxicam (MOBIC) 7.5 MG tablet Take 2 tablets (15 mg total) by mouth daily. (Patient not taking: Reported on 09/16/2014) 30 tablet 0  . oxyCODONE (OXY IR/ROXICODONE) 5 MG immediate release tablet Take 1 tablet (5 mg total) by mouth every 6 (six) hours as needed for moderate pain. (Patient not taking: Reported on 06/28/2014) 30 tablet 0  . promethazine (PHENERGAN) 25 MG tablet Take 1 tablet (25 mg total) by mouth every 6 (six) hours as needed for nausea or vomiting. (Patient not taking: Reported on 06/28/2014) 30 tablet 0    Objective: BP 159/96 mmHg  Pulse 62  Temp(Src) 97.9 F (36.6 C) (Oral)  Resp 23  Ht  (1.702 m)  Wt 302 lb (136.986 kg)  BMI 47.29 kg/m2  SpO2 99%  LMP 08/29/2015 Exam: General -- oriented x3, NAD, partially sedated (likely secondary to occasions provided in  ED) HEENT -- Head is normocephalic. PERRLA. EOMI. no JVD, MMM Neck -- supple; no bruits. Chest -- good expansion. Lungs clear to auscultation. Distant breath sounds Cardiac -- RRR. No murmurs noted.  Abdomen -- morbidly obese, soft, nontender. No masses palpable. Bowel sounds present. CNS -- cranial nerves II through XII grossly intact. 2+ reflexes bilaterally. Sensation intact throughout. Babinski wnl bilaterally. Extremeties - no effusions noted. LE ROM limited by pain. 4-5/5 bilateral LE strength  (equal). Dorsalis pedis pulses present and symmetrical. Substantial tenderness noted at the L4-5 and L5-S1 paraspinal region. Straight leg raise unable to be performed secondary to patient's pain.   Labs and Imaging: CBC BMET   Recent Labs Lab 09/15/15 0817  WBC 11.9*  HGB 12.4  HCT 38.9  PLT 257    Recent Labs Lab 09/15/15 0817  NA 138  K 4.2  CL 105  CO2 26  BUN 16  CREATININE 1.01*  GLUCOSE 92  CALCIUM 9.2      Kathee DeltonIan D McKeag, MD 09/15/2015, 5:34 PM PGY-2, [place] [facility] FPTS Intern pager: [phone], text pages welcome

## 2015-09-15 NOTE — ED Provider Notes (Signed)
CSN: [id number]     Arrival date & time 09/15/15  0746 History   First MD Initiated Contact with Patient 09/15/15 1013     Chief Complaint  Patient presents with  . Flank Pain     (Consider location/radiation/quality/duration/timing/severity/associated sxs/prior Treatment) HPI   Shirley [name] is A 25 year old female with history of hypertension, she presents to emergency room with bilateral low back pain that began last night after sitting with her daughter for several hours. She presents to emergency room with increasing bilateral low back pain with radiation down her right leg, described as electrical, burning and associated muscle spasms. She rates her pain 10 out of 10, worse with any movement of her legs that exacerbated most with positional changes especially getting up or down from a seated or laying position.  Pain is located throughout bilateral low back and buttocks.   Her pain is causing her to fall and have difficulty ambulating. She states this is because of pain and she denies numbness, tingling, weakness.   She states that last night and this morning she had decreased urinary volume but she denies any urinary frequency, urgency, dysuria, hematuria. She denies abdominal pain, fever, chills, sweats, nausea, vomiting.   She has no history of prior back pain or injury. She denies chronic steroid use, IV drug use, personal history of cancer. She denies any incontinence of urine or stool. She denies saddle anesthesia.   She states that she has known hypertension ever since She had her daughter, 10 years ago, however she is not on any medication for hypertension. She denies headache, visual disturbances, neck pain, chest pain, shortness of breath.   Past Medical History  Diagnosis Date  . No significant past medical history   . Hypertension    Past Surgical History  Procedure Laterality Date  . Tooth extraction N/A 10/22/2013    Procedure: DENTAL EXTRACTIONS TEETH #1, 16, 17, 32;   Surgeon: Georgia LopesScott M Jensen, DDS;  Location: [facility] OR;  Service: Oral Surgery;  Laterality: N/A;   Family History  Problem Relation Age of Onset  . Hypertension Mother    Social History  Substance Use Topics  . Smoking status: Current Every Day Smoker -- 0.00 packs/day  . Smokeless tobacco: Never Used  . Alcohol Use: Yes     Comment: rare   OB History    Gravida Para Term Preterm AB TAB SAB Ectopic Multiple Living   3    1     2      Review of Systems  All other systems reviewed and are negative.     Allergies  Review of patient's allergies indicates no known allergies.  Home Medications   Prior to Admission medications   Medication Sig Start Date End Date Taking? Authorizing Provider  doxycycline (VIBRAMYCIN) 100 MG capsule Take 1 capsule (100 mg total) by mouth 2 (two) times daily. Patient not taking: Reported on 09/15/2015 03/28/15   Armando ReichertHeather D Hogan, CNM  HYDROcodone-acetaminophen (NORCO/VICODIN) 5-325 MG per tablet Take 2 tablets by mouth every 4 (four) hours as needed. Patient not taking: Reported on 09/15/2015 09/16/14   Roxy Horsemanobert Browning, PA-C  meloxicam (MOBIC) 7.5 MG tablet Take 2 tablets (15 mg total) by mouth daily. Patient not taking: Reported on 09/16/2014 06/28/14   Antony MaduraKelly Humes, PA-C  oxyCODONE (OXY IR/ROXICODONE) 5 MG immediate release tablet Take 1 tablet (5 mg total) by mouth every 6 (six) hours as needed for moderate pain. Patient not taking: Reported on 06/28/2014 10/22/13   Ripudeep  [name], MD  promethazine (PHENERGAN) 25 MG tablet Take 1 tablet (25 mg total) by mouth every 6 (six) hours as needed for nausea or vomiting. Patient not taking: Reported on 06/28/2014 10/22/13   Ripudeep K Rai, MD   BP 159/96 mmHg  Pulse 62  Temp(Src) 97.9 F (36.6 C) (Oral)  Resp 23  Ht 5\' 7"  (1.702 m)  Wt 136.986 kg  BMI 47.29 kg/m2  SpO2 99%  LMP 08/29/2015 Physical Exam  Constitutional: She is oriented to person, place, and time. She appears well-developed and well-nourished. No  distress.  She appears distressed, intermittently writhing and screaming in pain with minimal movement of hips and legs.  No diaphoresis, non-toxic in appearance.  Otherwise well-appearing obese female, appears stated age.  HENT:  Head: Normocephalic and atraumatic.  Nose: Nose normal.  Mouth/Throat: Oropharynx is clear and moist. No oropharyngeal exudate.  Eyes: Conjunctivae and EOM are normal. Pupils are equal, round, and reactive to light. Right eye exhibits no discharge. Left eye exhibits no discharge. No scleral icterus.  Neck: Normal range of motion. No JVD present. No tracheal deviation present. No thyromegaly present.  Cardiovascular: Normal rate, regular rhythm, normal heart sounds and intact distal pulses.  Exam reveals no gallop and no friction rub.   No murmur heard. Pulses:      Radial pulses are 3+ on the right side, and 3+ on the left side.       Dorsalis pedis pulses are 2+ on the right side, and 2+ on the left side.       Posterior tibial pulses are 2+ on the right side, and 2+ on the left side.  Pulmonary/Chest: Effort normal and breath sounds normal. No respiratory distress. She has no wheezes. She has no rales. She exhibits no tenderness.  Abdominal: Soft. Normal appearance and bowel sounds are normal. She exhibits no distension and no mass. There is no tenderness. There is no rigidity, no rebound, no guarding and no CVA tenderness.  Obese abdomen, no CVA tenderness  Musculoskeletal: Normal range of motion. She exhibits tenderness. She exhibits no edema.       Back:  Lymphadenopathy:    She has no cervical adenopathy.  Neurological: She is alert and oriented to person, place, and time. She has normal reflexes. No cranial nerve deficit. She exhibits normal muscle tone. Coordination normal.  Skin: Skin is warm and dry. No rash noted. She is not diaphoretic. No erythema. No pallor.  Psychiatric: She has a normal mood and affect. Her behavior is normal. Judgment and thought  content normal.  Nursing note and vitals reviewed.   ED Course  Procedures (including critical care time) Labs Review Labs Reviewed  URINALYSIS, ROUTINE W REFLEX MICROSCOPIC (NOT AT [facility]) - Abnormal; Notable for the following:    APPearance CLOUDY (*)    Leukocytes, UA TRACE (*)    All other components within normal limits  BASIC METABOLIC PANEL - Abnormal; Notable for the following:    Creatinine, Ser 1.01 (*)    All other components within normal limits  CBC - Abnormal; Notable for the following:    WBC 11.9 (*)    All other components within normal limits  URINE MICROSCOPIC-ADD ON - Abnormal; Notable for the following:    Squamous Epithelial / LPF 0-5 (*)    Bacteria, UA RARE (*)    All other components within normal limits  POC URINE PREG, ED    Imaging Review Ct L-spine No Charge  09/15/2015  CLINICAL DATA:  Low back pain since yesterday morning. EXAM: CT LUMBAR SPINE WITHOUT CONTRAST TECHNIQUE: Multidetector CT imaging of the lumbar spine was performed without intravenous contrast administration. Multiplanar CT image reconstructions were also generated. COMPARISON:  None. FINDINGS: Mild disc bulges are noted at the L4-5 and L5-S1 levels. At L5-S1, the mild diffuse disc bulge, combined with degenerative facet hypertrophy, is causing moderate to severe bilateral neural foramen stenoses with possible associated nerve root impingement. At L4-5, slightly less pronounced disc bulge and degenerative facet hypertrophy is causing moderate bilateral neural foramen stenoses with possible associated nerve root impingement. Osseous alignment is normal. No acute or suspicious osseous lesion. No fracture line or displaced fracture fragment. Paravertebral soft tissues are unremarkable. IMPRESSION: 1. Mild diffuse disc bulge at L5-S1, combined with degenerative facet hypertrophy, causing moderate to severe bilateral neural foramen stenoses with possible associated nerve root impingement. Any  associated radiculopathic symptoms? If so, this could be better characterized with nonemergent lumbar spine MRI if needed. No significant central canal stenosis at any level. Milder degenerative change noted at the L4-5 level. 2. No acute findings. Electronically Signed   By: Bary Richard M.D.   On: 09/15/2015 14:48   Ct Renal Stone Study  09/15/2015  CLINICAL DATA:  Right flank pain and low back pain since yesterday morning. EXAM: CT ABDOMEN AND PELVIS WITHOUT CONTRAST TECHNIQUE: Multidetector CT imaging of the abdomen and pelvis was performed following the standard protocol without IV contrast. COMPARISON:  None. FINDINGS: Lower chest:  No acute findings. Hepatobiliary: The liver appears within normal limits for a noncontrast exam. Gallbladder appears normal. No bile duct dilatation. Pancreas: No mass or inflammatory process identified on this un-enhanced exam. Spleen: Within normal limits in size. Adrenals/Urinary Tract: Adrenal glands appear normal. Kidneys are unremarkable without stone or hydronephrosis. No ureteral or bladder calculi. Bladder appears normal. Stomach/Bowel: Bowel is normal in caliber. No bowel wall thickening or evidence of bowel wall inflammation. Moderate amount of stool throughout the nondistended colon. Appendix is normal. Vascular/Lymphatic: No pathologically enlarged lymph nodes. Small loosely clustered lymph nodes are seen within the right lower quadrant mesentery and central abdominal mesentery. No evidence of abdominal aortic aneurysm. Reproductive: No mass or other significant abnormality. Other: No free fluid or abscess collection. No free intraperitoneal air. Musculoskeletal: No acute or suspicious osseous lesion. Mild degenerative change within the lumbar spine. Superficial soft tissues are unremarkable. IMPRESSION: 1. No renal or ureteral calculi. No hydronephrosis. No perinephric fluid. 2. Small loosely clustered lymph nodes within the right lower quadrant mesentery and  central abdominal mesentery. This is probably an incidental benign finding but can be an indication of mild mesenteric adenitis. 3. Mild degenerative change within the lumbar spine. Electronically Signed   By: Bary Richard M.D.   On: 09/15/2015 14:34   I have personally reviewed and evaluated these images and lab results as part of my medical decision-making.   EKG Interpretation   Date/Time:  Friday September 15 2015 12:24:33 EDT Ventricular Rate:  68 PR Interval:  172 QRS Duration: 96 QT Interval:  397 QTC Calculation: 422 R Axis:   58 Text Interpretation:  Sinus rhythm Borderline repolarization abnormality  No significant change since last tracing Confirmed by YAO  MD, DAVID  ([id number]) on 09/15/2015 1:28:38 PM      MDM   Pt with bilateral low back pain with radiation down right leg.  Also mentioned at triage decreased urination.  Basic labs and UA obtained prior to the time of my evaluation.  Pt presentation and  exam consistent with low back pain with sciatica.  Patient denies abdominal pain, nausea, vomiting, dysuria, hematuria, much less suspicion for pallor or nephritis or UTI. Urinalysis is currently pending Patient given prednisone, Toradol and Valium. She is hypertensive on the monitor however appears very tense and will treat pain and muscle spasms and reevaluate her blood pressure.  She denies headache, chest pain, shortness of breath.  5:46 PM Patient had minimal relief with Toradol, Valium, prednisone and morphine.  Dr. Silverio Lay has seen and evaluated the patient, he ordered more aggressive pain medicine and Valium with CT renal stone study and lumbar spine study.  Patient continues to be hypertensive.  Imaging studies are significant for mild diffuse disc bulge at L5-S1, with degenerative facet hypertrophy, moderate to severe bilateral neural foramen stenosis with possible nerve root impingement, mild degenerative changes at L4-L5.  No acute findings.  Radiology suggests non-emergent  MRI patient has associated radiculopathy  The patient continued to complain of severe pain, rated 10 with persistent muscle spasms, which was unimproved with 2 mg of Valium, 4 mg of morphine followed by 2 mg of Dilaudid.    Pt will be admitted by family medicine for further work up and treatment. Obs admission to med surg by Dr. Deirdre Priest.    Final diagnoses:  Back pain     Danelle Berry, PA-C 09/15/15 1746  Richardean Canal, MD 09/17/15 [phone]

## 2015-09-16 DIAGNOSIS — M4806 Spinal stenosis, lumbar region: Secondary | ICD-10-CM

## 2015-09-16 DIAGNOSIS — M5441 Lumbago with sciatica, right side: Secondary | ICD-10-CM | POA: Diagnosis not present

## 2015-09-16 DIAGNOSIS — M48061 Spinal stenosis, lumbar region without neurogenic claudication: Secondary | ICD-10-CM

## 2015-09-16 DIAGNOSIS — I10 Essential (primary) hypertension: Secondary | ICD-10-CM | POA: Diagnosis not present

## 2015-09-16 DIAGNOSIS — M5416 Radiculopathy, lumbar region: Secondary | ICD-10-CM

## 2015-09-16 HISTORY — DX: Spinal stenosis, lumbar region without neurogenic claudication: M48.061

## 2015-09-16 LAB — BASIC METABOLIC PANEL
Anion gap: 7 (ref 5–15)
BUN: 12 mg/dL (ref 6–20)
CALCIUM: 8.4 mg/dL — AB (ref 8.9–10.3)
CHLORIDE: 106 mmol/L (ref 101–111)
CO2: 24 mmol/L (ref 22–32)
CREATININE: 0.91 mg/dL (ref 0.44–1.00)
GFR calc Af Amer: >60 mL/min (ref >60)
GFR calc non Af Amer: >60 mL/min (ref >60)
GLUCOSE: 94 mg/dL (ref 65–99)
Potassium: 3.5 mmol/L (ref 3.5–5.1)
Sodium: 137 mmol/L (ref 135–145)

## 2015-09-16 MED ORDER — PREDNISONE 10 MG PO TABS
50.0000 mg | ORAL_TABLET | Freq: Every day | ORAL | Status: DC
  Administered 2015-09-16: 50 mg via ORAL
  Filled 2015-09-16 (×2): qty 2

## 2015-09-16 MED ORDER — NAPROXEN 250 MG PO TABS
500.0000 mg | ORAL_TABLET | Freq: Two times a day (BID) | ORAL | Status: DC
  Administered 2015-09-16 – 2015-09-17 (×2): 500 mg via ORAL
  Filled 2015-09-16 (×2): qty 2

## 2015-09-16 MED ORDER — HYDRALAZINE HCL 20 MG/ML IJ SOLN: 5.0000 mg | INTRAMUSCULAR | Status: DC | PRN

## 2015-09-16 NOTE — Discharge Summary (Signed)
[facility]  Patient name: Shirley [name] Medical record number: [id number] Date of birth: [birth year] Age: 25 y.o. 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 Stable distant breath sounds d/t large body habitus. No focal crackles. Abdomen: Obese. Soft, NTND, +active bowel sounds. MSK: Low Back lumbar stable to improved mild palpation, with persistent paraspinal muscle hypertonicity but difficult exam with obesity. Bilateral knees resolved tenderness to palpation, without obvious deformity or effusion. Extremities: Lower extremity no edema, no erythema, symmetrical, calves non-tender. Peripheral pulses intact +2.  Brief Hospital Course:   Shirley [name] is a [age] yr female presented to ED for acute episode of LBP with some radicular pain in RLE, onset seemed triggered by sitting in fixed position for hours working on project with daughter, acute symptoms on standing and ambulation. No known inciting injury, fall, or trauma. Difficulty standing or ambulating without severe pain. History of LBP but this is most severe episode. In ED, treated for hours with multiple IV medications in attempt to control acute pain including Dilaudid,  Morphine, Toradol IV, and Diazepam for muscle relaxant, had CT Renal Stone study (negative) and also checked Lumbar Spine CT showed significant moderate to severe bilateral Lumbar spinal stenosis with possible nerve root impingement. Additionally significant HTN urgency peak >200/110 otherwise asymptomatic. Admitted to FPTS for continued pain control, HTN monitoring.  During hospitalization, patient demonstrated gradual improvement in LBP, remained relatively inactive on Day 1 but was able to ambulate with nursing and PT, however some concerns with unstable knees and almost fell, concern she would need HH PT (however not covered by medicaid). Continued Prednisone PO taper, Naproxen, Flexeril, and Oxy IR PRN. For HTN started on new HCTZ in hospital, likely increased by acute pain, however chart review with chronic HTN.  Stable for discharge home after tolerating ambulation, pain is stable to improved, counseled on long course of low back pain process, discharged on pain management regimen. To arrange hospital follow-up.  Issues for Follow Up:  1. Discharge pain control - rx Oxycodone IR 5mg  q 6 hr PRN (#10 pills), Flexeril 10mg  TID, Naproxen 500mg  BID x 2 weeks then PRN, finish Prednisone taper 40mg  (3/19) > 30, 20, 10mg  x total 6 day 2. Recommend not refilling Oxycodone in outpatient setting. If above regimen is not helping, could consider alternative with Gabapentin for pain (radicular symptoms) or Tramadol. 3. Will need outpatient referral to PT 4. Consider future Lumbar MRI if significant worsening (any nerve compression red flags), otherwise would likely wait >6 weeks to see if improving before consider MRI, additionally could consider defer MRI and referral to Orthopedics for considering Spinal Epidural if considering advanced treatments 5. Likely chronic HTN, prior untreated - BP significantly improved with controlled pain in hospital to 130s/60-80s. Did not continue HCTZ 12.5mg  on discharge.  Re-check BP in office consider starting anti-HTN outpatient.  Significant Procedures: none  Significant  Labs and Imaging:   Recent Labs Lab 09/15/15 0817  WBC 11.9*  HGB 12.4  HCT 38.9  PLT 257    Recent Labs Lab 09/15/15 0817 09/16/15 0626  NA 138 137  K 4.2 3.5  CL 105 106  CO2 26 24  GLUCOSE 92 94  BUN 16 12  CREATININE 1.01* 0.91  CALCIUM 9.2 8.4*   UA - trace leuks, neg nitrites, RBC 0-5, WBC 0-5, Spec grav 1.023  Upreg negative  Imaging/Diagnostic Tests:  09/15/15 CT Renal Stone Study IMPRESSION: 1. No renal or ureteral calculi. No hydronephrosis. No perinephric fluid. 2. Small loosely clustered lymph nodes within the right lower quadrant mesentery and central abdominal mesentery. This is probably an incidental benign finding but can be an indication of mild mesenteric adenitis. 3. Mild degenerative change within the lumbar spine.  09/15/15 CT L-Spine add on to above CT Stone Study IMPRESSION: 1. Mild diffuse disc bulge at L5-S1, combined with degenerative facet hypertrophy, causing moderate to severe bilateral neural foramen stenoses with possible associated nerve root impingement. Any associated radiculopathic symptoms? If so, this could be better characterized with nonemergent lumbar spine MRI if needed. No significant central canal stenosis at any level. Milder degenerative change noted at the L4-5 level. 2. No acute findings.  Results/Tests Pending at Time of Discharge: none  Discharge Medications:    Medication List    STOP taking these medications        doxycycline 100 MG capsule  Commonly known as:  VIBRAMYCIN     HYDROcodone-acetaminophen 5-325 MG tablet  Commonly known as:  NORCO/VICODIN     meloxicam 7.5 MG tablet  Commonly known as:  MOBIC     promethazine 25 MG tablet  Commonly known as:  PHENERGAN      TAKE these medications        acetaminophen 500 MG tablet  Commonly known as:  TYLENOL  Take 1-2 tablets (500-1,000 mg  total) by mouth every 6 (six) hours as needed for mild pain or moderate pain.     cyclobenzaprine 10 MG tablet  Commonly known as:  FLEXERIL  Take 0.5-1 tablets (5-10 mg total) by mouth 3 (three) times daily as needed for muscle spasms.     naproxen 500 MG tablet  Commonly known as:  NAPROSYN  Take 1 tablet (500 mg total) by mouth 2 (two) times daily with a meal. For 2 weeks, then as needed.     oxyCODONE 5 MG immediate release tablet  Commonly known as:  Oxy IR/ROXICODONE  Take 1 tablet (5 mg total) by mouth every 6 (six) hours as needed for severe pain.     predniSONE 10 MG tablet  Commonly known as:  DELTASONE  Start Monday take 3 tabs ( ) with breakfast, next day take 2 tabs ( ), then next day take 1 tab ( ).        Discharge Instructions: Please refer to Patient Instructions section of EMR for full details.  Patient was counseled important signs and symptoms that should prompt return to medical care, changes in medications, dietary instructions, activity restrictions, and follow up appointments.   Follow-Up Appointments: Follow-up Information    Follow up with [facility]. Call in 2 days.   Why:  Schedule hospital follow-up appointment within 1 week.   Contact information:   [address] [place] [address] [phone]      Follow up with [facility]. Call in 2 days.  Specialty:  Family Medicine   Why:  hospital follow-up vs establish new primary doctor   Contact information:   [address] [id number] [place] [address] [phone]      Smitty Cords, DO 09/17/2015, 11:10 AM PGY-3, [facility]

## 2015-09-16 NOTE — Evaluation (Signed)
Physical Therapy Evaluation Patient Details Name: Shirley [name] MRN: [id number] DOB: Sep 07, 1990 Today's Date: 09/16/2015   History of Present Illness  patient admittd with severe back pain.  W/U reveals herniated disks at L4-5 and L5-S1.  Patient also with acute kideny injury, HTN and morbid obesity.    Clinical Impression  Patient presents with severe back pain and knee pain/weakness leading to dependencies in gait and mobility.  Patient very preoccupied by what she cannot do and thus had difficulty educating her in exercises and increased mobility.  During gait, patient's knee buckled and pt reports this happens frequently.  I do feel patient would benefit from continued PT in the [facility] setting to address both her back pain and her knee weakness.      Follow Up Recommendations Home health PT    Equipment Recommendations  None recommended by PT    Recommendations for Other Services       Precautions / Restrictions Precautions Precautions: Fall Restrictions Weight Bearing Restrictions: No      Mobility  Bed Mobility Overal bed mobility: Needs Assistance Bed Mobility: Supine to Sit;Sit to Supine     Supine to sit: Supervision;HOB elevated Sit to supine: Supervision   General bed mobility comments: raised HOB to !70 degrees, able to get sitting on EOB using rail and with increased pain (per pt); attempted to instruct patient in log rolling and she reports that is how she gets out of bed, but not what she demonstrated.  Transfers Overall transfer level: Needs assistance Equipment used: 1 person hand held assist Transfers: Sit to/from Stand Sit to Stand: Min guard         General transfer comment: able to reach standing with increased pain  Ambulation/Gait Ambulation/Gait assistance: Mod assist Ambulation Distance (Feet): 40 Feet Assistive device: 1 person hand held assist Gait Pattern/deviations: Step-through pattern;Wide base of support;Decreased weight shift to  right;Decreased weight shift to left;Antalgic;Decreased stride length Gait velocity: decreased Gait velocity interpretation: Below normal speed for age/gender General Gait Details: patient ambulated with decrease pace.  After ambulating about 10 feet, patient with right knee buckle requiring assistance to regain balance.  Patient with several rest breaks during gait; patient reporting increased back and knee pain during gait.  Stairs            Wheelchair Mobility    Modified Rankin (Stroke Patients Only)       Balance Overall balance assessment: Needs assistance Sitting-balance support: No upper extremity supported Sitting balance-Leahy Scale: Good     Standing balance support: No upper extremity supported Standing balance-Leahy Scale: Fair Standing balance comment: a                             Pertinent Vitals/Pain Pain Assessment: 0-10 Pain Score: 8  Pain Location: back/sacral area; right knee Pain Descriptors / Indicators: Cramping;Spasm;Sharp Pain Intervention(s): Limited activity within patient's tolerance;Monitored during session;Premedicated before session    Home Living Family/patient expects to be discharged to:: Private residence Living Arrangements: Children;Spouse/significant other Available Help at Discharge: Family                  Prior Function Level of Independence: Independent               Hand Dominance        Extremity/Trunk Assessment   Upper Extremity Assessment: Generalized weakness           Lower Extremity Assessment: Generalized weakness;RLE deficits/detail RLE  Deficits / Details: patient reports her knee was "locked up" and swollen.  Did not slight swelling in right knee compared to left.    Cervical / Trunk Assessment: Normal  Communication   Communication: No difficulties  Cognition Arousal/Alertness: Awake/alert Behavior During Therapy: WFL for tasks assessed/performed Overall Cognitive  Status: Within Functional Limits for tasks assessed                      General Comments General comments (skin integrity, edema, etc.): attempted to assess patient for SI rotation - difficult to do secondary body habitus and back pain.  Did appear that right SI was slightly anteriorly rotated - gave isometric hamstring exercises to try to correct.  When attempting isometric, patient continued to complain of right knee pain and swelling.      Exercises General Exercises - Lower Extremity Ankle Circles/Pumps: AROM;Both;10 reps;Supine Quad Sets: AROM;Both;10 reps;Supine Other Exercises Other Exercises: hamstring isometric exercise.      Assessment/Plan    PT Assessment Patient needs continued PT services  PT Diagnosis Acute pain;Difficulty walking   PT Problem List Decreased strength;Decreased activity tolerance;Decreased balance;Decreased mobility;Pain;Obesity  PT Treatment Interventions DME instruction;Gait training;Functional mobility training;Therapeutic activities;Therapeutic exercise;Balance training;Patient/family education   PT Goals (Current goals can be found in the Care Plan section) Acute Rehab PT Goals Patient Stated Goal: "be able to just sit up" PT Goal Formulation: With patient Time For Goal Achievement: 09/23/15 Potential to Achieve Goals: Fair    Frequency Min 3X/week   Barriers to discharge        Co-evaluation               End of Session   Activity Tolerance: Patient limited by pain Patient left: in bed;with call bell/phone within reach;with family/visitor present      Functional Assessment Tool Used: clinical judgement Functional Limitation: Mobility: Walking and moving around Mobility: Walking and Moving Around Current Status ([id number](G8978): At least 20 percent but less than 40 percent impaired, limited or restricted Mobility: Walking and Moving Around Goal Status [phone](G8979): At least 1 percent but less than 20 percent impaired, limited or  restricted    Time: 1440-1515 PT Time Calculation (min) (ACUTE ONLY): 35 min   Charges:   PT Evaluation $PT Eval Moderate Complexity: 1 Procedure PT Treatments $Gait Training: 8-22 mins   PT G Codes:   PT G-Codes **NOT FOR INPATIENT CLASS** Functional Assessment Tool Used: clinical judgement Functional Limitation: Mobility: Walking and moving around Mobility: Walking and Moving Around Current Status ([id number](G8978): At least 20 percent but less than 40 percent impaired, limited or restricted Mobility: Walking and Moving Around Goal Status [phone]([id number]): At least 1 percent but less than 20 percent impaired, limited or restricted    Olivia CanterMoton, Kaylor Simenson M 09/16/2015, 3:28 PM  09/16/2015 Corlis HoveMargie Makailyn Mccormick, PT [phone]

## 2015-09-16 NOTE — Progress Notes (Signed)
CM received voicemail from MD requesting HHPT for medicaid pt.  Unfortunately, pt does not meet criteria for HHPT (not a CVA or joint) and is set up with Family Medicine for PCP.  No other CM needs were communicated.

## 2015-09-16 NOTE — Progress Notes (Signed)
Family Medicine Teaching Service Daily Progress Note Intern Pager: [phone]  Patient name: Shirley [name] Medical record number: [id number] Date of birth: 1990/08/16 Age: 25 y.o. Gender: female  Primary Care Provider: No PCP Per Patient Consultants: None Code Status: Full  Pt Overview and Major Events to Date:  3/18 Admitted LBP with sciatica, CT with L5-S1 DJD spinal stenosis, possible nerve impingement  Assessment and Plan: Shirley [name] is a 25 y.o. female presenting with low back pain with sciatica. PMH is significant for morbid obesity, recurrent boils, knee pain, and hypertension.  # Low Back Pain w/ R-Sciatica, secondary to Lumbar DJD / Spinal Stenosis with possible nerve impingement - Improved On admit had CT L-spine demonstrated mod/severe L5-S1 DJD w/ stenosis and possible disc herniation w/ nerve impingement. No known trauma or injury. Chronic LBP without significant diagnosis or treatment prior. Intermittent sciatica, without evidence of cauda equina, focal neuro deficits, or other red flags. Likely complicated by morbid obesity, deconditioning with weak supporting muscles - s/p Toradol IV, Diazepam IV, Morphine and Dilaudid IV in ED - Continue prednisone taper  today for sciatica/nerve impingement, down from  yesterday, taper by  until finished x 6 days - Pain control with Naproxen  BID (would continue up to 2 week course then PRN), Flexeril  TID, and breakthrough analgesia with Oxycodone IR  q 4 hr PRN severe pain - Future may need MRI Lumbar spine if significant worsening, otherwise if stable or persistent pain would recommend no further imaging until up to 6 weeks - Increase activity, up with assistance - PT eval, treat, with some concerns with LBP also some concern for knee instability, recommend continued [facility] PT or outpatient PT. However, per CM given Medicaid ins HH PT is not covered, see note. Would recommend future outpatient PT orders.  #  Hypertension, uncontrolled with resolved HTN Urgency: On admit, peak BP up to 205/121, otherwise persistent elevated SBP 150-180 / DBP 100-130s, likely secondary to acute severe LBP. Also suspect poorly controlled at baseline, chart review last encounter ED 08/2014 with BP 191/123. No acute symptoms of CP/SOB or evidence of end organ injury on admit. - Continue HCTZ 12.5mg  daily for now, likely need titrate up to  daily in future outpatient once pain controlled if still elevated BP - Hydralazine IV 5 mg q 4 hr PRN for blood pressures >200/>110  # Mild AKI vs Mild elevated Cr - Stable Baseline Cr 0.7 to 0.9. On admit Cr 1.01, initially concern for mild AKI, treated with IVF overnight. Repeat Cr down to 0.91, suspect most likely fluctuation of normal Cr. However patient with chronic uncontrolled HTN it seems, may be at risk for inc Cr in future, currently without CKD. - Discontinued IVF today. Tolerating PO. - Resumed NSAIDs  # Morbid obesity, with deconditioning Multifactorial contributing cause to LBP and DJD / spinal stenosis with possible disc herniation / nerve impingement.  # Knee Instability, bilateral No significant exam findings, but limited by LBP. Fall prior to admit with weight on both knees as she caught herself, may have mild knee contusions. No prior known knee pathology. Ambulating with PT concern for knee instability, may inc risk for future falls. Likely secondary to morbid obesity and muscle weakness from deconditioning. - Continue above analgesia treatment - Future outpatient PT for knee instability along with gait training  FEN/GI: Heart healthy/carb modified diet. Normal saline at 150ml/hr Prophylaxis: Lovenox  Disposition: Home; anticipated 09/16/15  Disposition: Admitted to observation, now upgraded to inpatient after received IV  pain medicine and CT imaging confirmed diagnosis of Lumbar spinal stenosis with some disc/nerve impingement with radiculopathy, will need  continued PT prior to patient stable to be discharged home, anticipate tomorrow 09/17/15.  Subjective:  Reports gradually improving low back pain, prior to admit 9/10, today 7/10 on medications, think muscle relaxant is helping, has constant back pain but significant worsening with movements occasionally causes some pain to radiate down right leg. Able to ambulate to bathroom with nurse assistance. Denies saddle anesthesia or numbness, bowel/bladd incontinence or retention, fever/chills, rash.  Later in day, has worked with PT, concerned about going home and still feels limited by back pain and has difficulty sitting up and ambulating without assistance. She will make arrangements for mother to stay with her and family assistance at home starting tomorrow.  Objective: Temp:  [97.4 F (36.3 C)-99 F (37.2 C)] 99 F (37.2 C) (03/18 1406) Pulse Rate:  [52-72] 62 (03/18 1406) Resp:  [18] 18 (03/18 1406) BP: (159-179)/(92-105) 166/92 mmHg (03/18 1406) SpO2:  [95 %-99 %] 99 % (03/18 1406) Weight:  [310 lb 4.8 oz (140.751 kg)] 310 lb 4.8 oz (140.751 kg) (03/17 1942) Physical Exam: General: resting in bed, appears comfortable, some difficulty sitting up, NAD Cardiovascular: RRR, no murmurs Respiratory: CTAB. Distant breath sounds d/t large body habitus. No focal crackles. Abdomen: Obese. Soft, NTND, +active bowel sounds. MSK: Low Back lumbar pain on palpation, with paraspinal muscle hypertonicity but difficult exam with obesity. No acute bony tenderness over spinous processes. Bilateral knees mild tenderness to palpation, without obvious deformity or effusion. Limited testing due to LBP. Extremities: Lower extremity no edema, no erythema, symmetrical, calves non-tender. Peripheral pulses intact +2.  Laboratory:  Recent Labs Lab 09/15/15 0817  WBC 11.9*  HGB 12.4  HCT 38.9  PLT 257    Recent Labs Lab 09/15/15 0817 09/16/15 0626  NA 138 137  K 4.2 3.5  CL 105 106  CO2 26 24  BUN 16  12  CREATININE 1.01* 0.91  CALCIUM 9.2 8.4*  GLUCOSE 92 94   UA - trace leuks, neg nitrites, RBC 0-5, WBC 0-5, Spec grav 1.023  Upreg negative    Imaging/Diagnostic Tests:  09/15/15 CT Renal Stone Study IMPRESSION: 1. No renal or ureteral calculi. No hydronephrosis. No perinephric fluid. 2. Small loosely clustered lymph nodes within the right lower quadrant mesentery and central abdominal mesentery. This is probably an incidental benign finding but can be an indication of mild mesenteric adenitis. 3. Mild degenerative change within the lumbar spine.  09/15/15 CT L-Spine add on to above CT Stone Study IMPRESSION: 1. Mild diffuse disc bulge at L5-S1, combined with degenerative facet hypertrophy, causing moderate to severe bilateral neural foramen stenoses with possible associated nerve root impingement. Any associated radiculopathic symptoms? If so, this could be better characterized with nonemergent lumbar spine MRI if needed. No significant central canal stenosis at any level. Milder degenerative change noted at the L4-5 level. 2. No acute findings.  Smitty CordsAlexander J Cyril Woodmansee, DO 09/16/2015, 5:33 PM PGY-3, [facility] FPTS Intern pager: [phone], text pages welcome

## 2015-09-17 DIAGNOSIS — I10 Essential (primary) hypertension: Secondary | ICD-10-CM | POA: Diagnosis not present

## 2015-09-17 DIAGNOSIS — M4806 Spinal stenosis, lumbar region: Secondary | ICD-10-CM | POA: Diagnosis not present

## 2015-09-17 DIAGNOSIS — M5441 Lumbago with sciatica, right side: Secondary | ICD-10-CM | POA: Diagnosis not present

## 2015-09-17 MED ORDER — NAPROXEN 500 MG PO TABS: 500.0000 mg | ORAL_TABLET | Freq: Two times a day (BID) | ORAL | Status: DC

## 2015-09-17 MED ORDER — ACETAMINOPHEN 500 MG PO TABS: 500.0000 mg | ORAL_TABLET | Freq: Four times a day (QID) | ORAL | Status: DC | PRN

## 2015-09-17 MED ORDER — CYCLOBENZAPRINE HCL 10 MG PO TABS: 5.0000 mg | ORAL_TABLET | Freq: Three times a day (TID) | ORAL | Status: DC | PRN

## 2015-09-17 MED ORDER — OXYCODONE HCL 5 MG PO TABS: 5.0000 mg | ORAL_TABLET | Freq: Four times a day (QID) | ORAL | Status: DC | PRN

## 2015-09-17 MED ORDER — PREDNISONE 20 MG PO TABS
40.0000 mg | ORAL_TABLET | Freq: Every day | ORAL | Status: AC
  Administered 2015-09-17: 40 mg via ORAL

## 2015-09-17 MED ORDER — PREDNISONE 10 MG PO TABS: ORAL_TABLET | ORAL | Status: DC

## 2015-09-17 NOTE — Progress Notes (Signed)
Physical Therapy Treatment Patient Details Name: Shirley [name] MRN: [id number] DOB: 02/28/1991 Today's Date: 09/17/2015    History of Present Illness patient admittd with severe back pain.  W/U reveals herniated disks at L4-5 and L5-S1.  Patient also with acute kideny injury, HTN and morbid obesity.      PT Comments    Pt w/ +Slump Test on the Rt and educated on nerve gliding exercise as documented below. Pt declined ambulation today due to back pain.  Pt will benefit from continued skilled PT services to increase functional independence and safety.   Follow Up Recommendations  Home health PT     Equipment Recommendations  None recommended by PT    Recommendations for Other Services       Precautions / Restrictions Precautions Precautions: Fall Restrictions Weight Bearing Restrictions: No    Mobility  Bed Mobility Overal bed mobility: Needs Assistance Bed Mobility: Supine to Sit     Supine to sit: Supervision;HOB elevated     General bed mobility comments: HOB elevated and use of bed rail   Transfers Overall transfer level: Needs assistance Equipment used: None Transfers: Sit to/from Stand Sit to Stand: Supervision         General transfer comment: No cues or physical assist but pt slow to stand  Ambulation/Gait             General Gait Details: pt declined   Stairs            Wheelchair Mobility    Modified Rankin (Stroke Patients Only)       Balance Overall balance assessment: Needs assistance Sitting-balance support: Feet supported;No upper extremity supported Sitting balance-Leahy Scale: Good     Standing balance support: No upper extremity supported;During functional activity Standing balance-Leahy Scale: Fair                      Cognition Arousal/Alertness: Awake/alert Behavior During Therapy: WFL for tasks assessed/performed Overall Cognitive Status: Within Functional Limits for tasks assessed                       Exercises Other Exercises Other Exercises: Nerve gliding exercise 3 sets x10.  To perform 2x/day.  Sitting EOB pt DF/PF Rt ankle.  She was unable to tolerate incorporation of trunk/neck flexion and extension.    General Comments General comments (skin integrity, edema, etc.): Pt w/ +Slump Test on the Rt and educated on nerve gliding exercise documented below.  Test for directional preference which was negative for flexion and extension.      Pertinent Vitals/Pain Pain Assessment: 0-10 Pain Score: 8  Pain Location: back/sacral area; Rt LE Pain Descriptors / Indicators: Aching;Shooting Pain Intervention(s): Limited activity within patient's tolerance;Monitored during session    Home Living                      Prior Function            PT Goals (current goals can now be found in the care plan section) Acute Rehab PT Goals Patient Stated Goal: decreased pain PT Goal Formulation: With patient Time For Goal Achievement: 09/23/15 Potential to Achieve Goals: Fair Progress towards PT goals: Not progressing toward goals - comment (pt declined ambulating today)    Frequency  Min 3X/week    PT Plan Current plan remains appropriate    Co-evaluation             End of Session  Activity Tolerance: Patient limited by pain Patient left: in bed;with call bell/phone within reach     Time: 1218-1231 PT Time Calculation (min) (ACUTE ONLY): 13 min  Charges:  $Therapeutic Exercise: 8-22 mins                    G Codes:      Encarnacion ChuAshley Abashian PT, DPT  Pager: [phone] Phone: [phone] 09/17/2015, 1:20 PM

## 2015-09-17 NOTE — Discharge Instructions (Signed)
You were hospitalized for acute episode of Low Back Pain with Nerve Radiation Pain (Sciatica) in your leg. CT scan of your Lumbar Spine low back showed some degenerative disc disease consistent with arthritis in several locations, the scan was also concerning for possible herniated disc causing nerve impingement, this is a possible cause of your symptoms, but imaging does not always confirm this. We are treating you with several medications for your low back pain: - Continue to finish the Prednisone course, take 3 pills (30mg ) with breakfast tomorrow on Monday, then next day 2 pills (20mg ) Tuesday, then 1 pill Wednesday and you are finished - Continue to take Naproxen 500mg  every day (12 hrs apart, with food, breakfast and dinner) for next 2 to 4 weeks if helping, then can use only as needed (do NOT take aleve, ibuprofen, advil, meloxicam or motrin while on this medication). It is safe to take Tylenol 500-1000mg  every 6 hours as needed (max dose 3000mg  in 24 hours) - Start Cyclobenzapine (Flexeril) muscle relaxer 10mg  tablets - may take whole or cut in half for 5mg  for muscle relaxant - may make you sedated or sleepy (be careful driving or working on this) if tolerated you can take every 8 hours, half or whole tab - Recommend to start using heating pad on your lower back 1-2x daily for few weeks  Also given Oxycodone 5mg , take these only if pain is severe, one every 6 hours as needed. This is not a good long-term treatment for your back pain, as can make pain worse if taken regularly, causing people to take higher doses with other complications.  We recommend having your doctor refer you to Outpatient Physical Therapy to help strengthen your back and core muscles, that is the primary treatment for this problem.  This pain may take weeks to months to fully resolve, but hopefully it will respond to the medicine initially. All back injuries (small or serious) are slow to heal since we use our back muscles  every day. Be careful with turning, twisting, lifting, sitting / standing for prolonged periods, and avoid re-injury.  If your symptoms significantly worsen with more pain, or new symptoms with weakness in one or both legs, new or different shooting leg pains, numbness in legs or groin, loss of control or retention of urine or bowel movements, you may need to go directly to the Emergency Department.

## 2015-09-17 NOTE — Progress Notes (Signed)
[id number] AM-Patient and friends were yelling, laughing and cheering for several hours.  Asked multiple times by staff to lower their voices or her visitors were going to have to leave.  Patient and visitors lowered their voices a small amount and kept door closed.  Ms Shirley [name] has been walking around the room with minimal assist per her description.  Refused Lovenox tonight.

## 2015-09-17 NOTE — Progress Notes (Signed)
Shirley RepressSacoya M Sia to be D/C'd Home per MD order.  Discussed prescriptions and follow up appointments with the patient. Prescriptions given to patient, medication list explained in detail. Pt verbalized understanding.    Medication List    STOP taking these medications        doxycycline 100 MG capsule  Commonly known as:  VIBRAMYCIN     HYDROcodone-acetaminophen 5-325 MG tablet  Commonly known as:  NORCO/VICODIN     meloxicam 7.5 MG tablet  Commonly known as:  MOBIC     promethazine 25 MG tablet  Commonly known as:  PHENERGAN      TAKE these medications        acetaminophen 500 MG tablet  Commonly known as:  TYLENOL  Take 1-2 tablets (500-1,000 mg total) by mouth every 6 (six) hours as needed for mild pain or moderate pain.     cyclobenzaprine 10 MG tablet  Commonly known as:  FLEXERIL  Take 0.5-1 tablets (5-10 mg total) by mouth 3 (three) times daily as needed for muscle spasms.     naproxen 500 MG tablet  Commonly known as:  NAPROSYN  Take 1 tablet (500 mg total) by mouth 2 (two) times daily with a meal. For 2 weeks, then as needed.     oxyCODONE 5 MG immediate release tablet  Commonly known as:  Oxy IR/ROXICODONE  Take 1 tablet (5 mg total) by mouth every 6 (six) hours as needed for severe pain.     predniSONE 10 MG tablet  Commonly known as:  DELTASONE  Start Monday take 3 tabs (30mg ) with breakfast, next day take 2 tabs (20mg ), then next day take 1 tab (10mg ).        Filed Vitals:   09/16/15 2004 09/17/15 0622  BP: 155/97 131/66  Pulse: 69 63  Temp: 99.3 F (37.4 C) 98.9 F (37.2 C)  Resp: 18 18    Skin clean, dry and intact without evidence of skin break down, no evidence of skin tears noted. IV catheter discontinued intact. Site without signs and symptoms of complications. Dressing and pressure applied. Pt denies pain at this time. No complaints noted.  An After Visit Summary was printed and given to the patient. Patient escorted via WC, and D/C home  via private auto.  Jonell CluckKadeesha Tashawn Greff RN [facility] Phone [phone]

## 2015-09-25 ENCOUNTER — Ambulatory Visit (INDEPENDENT_AMBULATORY_CARE_PROVIDER_SITE_OTHER): Payer: Medicaid Other | Admitting: Family Medicine

## 2015-09-25 ENCOUNTER — Encounter: Payer: Self-pay | Admitting: Family Medicine

## 2015-09-25 VITALS — BP 130/70 | HR 86 | Temp 98.4°F | Ht 67.0 in | Wt 306.0 lb

## 2015-09-25 DIAGNOSIS — M5441 Lumbago with sciatica, right side: Secondary | ICD-10-CM | POA: Diagnosis not present

## 2015-09-25 DIAGNOSIS — M5416 Radiculopathy, lumbar region: Secondary | ICD-10-CM

## 2015-09-25 DIAGNOSIS — M4806 Spinal stenosis, lumbar region: Secondary | ICD-10-CM | POA: Diagnosis present

## 2015-09-25 DIAGNOSIS — R030 Elevated blood-pressure reading, without diagnosis of hypertension: Secondary | ICD-10-CM

## 2015-09-25 DIAGNOSIS — M48061 Spinal stenosis, lumbar region without neurogenic claudication: Secondary | ICD-10-CM

## 2015-09-25 DIAGNOSIS — IMO0001 Reserved for inherently not codable concepts without codable children: Secondary | ICD-10-CM

## 2015-09-25 MED ORDER — GABAPENTIN 100 MG PO CAPS: 100.0000 mg | ORAL_CAPSULE | Freq: Three times a day (TID) | ORAL | Status: DC

## 2015-09-25 NOTE — Progress Notes (Signed)
Subjective:    Patient ID: Shirley [name], female    DOB: [birth year], [age] y.o.   MRN: [id number]  Shirley RepressSacoya M Prestia is a 25 y.o. female presenting on 09/25/2015 for Hospitalization Follow-up   HPI   Hospital follow-up 3/17 to 3/19 for Acute Low Back Pain  LBP w/ R-sciatica, secondary to Lumbar DJD / Spinal Stenosis w/ herniated disc vs nerve impingement - Pain improved initially for few days after hospitalization, then pain significantly returned on Friday 09/22/15. Describes back pain as constant 8-9/10, sharp stabbing. Pain is worse with getting up from laying or seated position, walking. Also difficulty laying flat, has to sleep sitting up. Associated with pain shooting down to Right ankle. Currently she is not working and not in school, pain is not keeping her from obligations but limiting her daily function. - Seemed to improve on prednisone. Finished Prednisone taper x 6 days, last dose on Wednesday 3/22 - Taking Naproxen 500mg  BID x 2 weeks without significant relief, Oxycodone IR 5mg  q 6-8 hr PRN took at night able to sleep but not really treating the pain. Flexeril 10mg  TID PRN (only tried a few doses without significant relief) - Admits constipation - Denies new trauma, injury, focal weakness, saddle anesthesia, numbness, tingling, urinary or bowel incontinence or urinary retention  Social History  Substance Use Topics  . Smoking status: Former Smoker -- 0.00 packs/day    Quit date: 09/15/2015  . Smokeless tobacco: Never Used  . Alcohol Use: 0.0 oz/week    0 Standard drinks or equivalent per week     Comment: rare    Review of Systems Per HPI unless specifically indicated above     Objective:    BP 130/70 mmHg  Pulse 86  Temp(Src) 98.4 F (36.9 C) (Oral)  Ht 5\' 7"  (1.702 m)  Wt 306 lb (138.801 kg)  BMI 47.92 kg/m2  SpO2 97%  LMP 08/29/2015  Wt Readings from Last 3 Encounters:  09/25/15 306 lb (138.801 kg)  09/15/15 310 lb 4.8 oz (140.751 kg)  06/28/14 290 lb  (131.543 kg)    Physical Exam  Constitutional: She appears well-developed and well-nourished. No distress.  Morbidly obese, uncomfortable due to back pain, cooperative  HENT:  Head: Normocephalic and atraumatic.  Mouth/Throat: Oropharynx is clear and moist.  Neck: Normal range of motion. Neck supple.  Cardiovascular: Normal rate, regular rhythm, normal heart sounds and intact distal pulses.   No murmur heard. Pulmonary/Chest: Effort normal.  Musculoskeletal: She exhibits no edema.  Low Back Inspection: Obesity Palpation: Non-tender to palpation bilateral lower back and SI region, no hypertonic muscle spasms appreciable ROM: limited back extension due to pain Special Testing: Seated SLR with Right lower back pain reproduced but negative radicular pain Strength: 5/5 hip, knee flex, ankle dorsiflex Neurovascular: distally intact   Neurological: She is alert.  Bilateral patellar and achilles DTR intact +2. Distal sensation to light touch intact bilateral LE and feet. Normal gait once up from seated position.  Skin: Skin is warm and dry. No rash noted. She is not diaphoretic.  Nursing note and vitals reviewed.      Assessment & Plan:   Problem List Items Addressed This Visit    Elevated BP    BP stable today without any anti-HTN meds, stopped HCTZ on discharge. Currently in pain also, suggests patient may not meet criteria for dx HTN outside of acute pain episode/hospitalization. - Suspect Pre-HTN, monitor in clinic      Low back pain with right-sided sciatica  Acute on chronic R LBP with associated R sciatica and underlying likely herniated disc, without known injury or trauma.  - No red flag symptoms. Negative SLR for radiculopathy - Mixed response to conservative therapy  Plan: 1. Ordered ambulatory Physical Therapy - likely only get 1 scheduled visit with medicaid, patient has transportation, will see if this helps and will need home exercises 2. Finish course Naproxen   BID x total 1 month, then PRN 3. Start Gabapentin  TID (titrate up) 4. Continue Flexeril take 5-10mg  up to TID PRN, titrate up as tolerated 5. May use Tylenol PRN for breakthrough 6. Encouraged use of heating pad 1-2x daily for now then PRN 7. RTC 6 weeks, re-evaluation. If not improved consider Lumbar MRI eval disc, and consider repeat prednisone longer burst, consider tramadol, caution opiates       Relevant Medications   gabapentin (NEURONTIN) 100 MG capsule   Other Relevant Orders   Ambulatory referral to Physical Therapy   OBESITY, MORBID    Likely strong factor contributing to her back and knee pain, deconditioning. - Counseled on importance of weight loss, however patient will need to gradually recover from acute back flare before likely successful at weight loss      Spinal stenosis of lumbar region with radiculopathy - Primary    Suspected underlying etiology of acute on chronic back pain, identified on CT L-spine in hospital. No acute symptoms of cauda equina or emergent nerve/spinal compression. - Continue conservative treatments, needs PT and strengthening with deconditioning - Future consider MRI Lumbar if not improving by 6 weeks (around 10/30/2015)      Relevant Medications   gabapentin (NEURONTIN) 100 MG capsule   Other Relevant Orders   Ambulatory referral to Physical Therapy      Meds ordered this encounter  Medications  . gabapentin (NEURONTIN) 100 MG capsule    Sig: Take 1 capsule (100 mg total) by mouth 3 (three) times daily.    Dispense:  90 capsule    Refill:  2      Follow up plan: Return in about 4 weeks (around 10/23/2015).  Saralyn Pilar, DO [facility], PGY-3

## 2015-09-25 NOTE — Patient Instructions (Addendum)
Thank you for coming in to clinic today.  1. For your Low Back Pain - The good news with your pain is that at this time there is no evidence of Nerve Damage. This will take some time to heal because of the arthritis and compression in your lower back. - Start new medication Gabapentin 100mg  capsules, start with one at bedtime for next 1-3 days, then if tolerated may increase to 1 tab twice a day for 1-3 days, then increase to 1 tab 3 times a day everyday, do not miss doses. It may make you a little drowsy and sleepy at first, this should improve once you are taking it for a few days. - Continue taking Naproxen 500mg  twice daily with food to finish another 2 weeks, then as needed - Start Tylenol 1000mg  up to 3 times a day most days for next 1-2 weeks then as needed - May try the Flexeril / Cyclobenzaprine muscle relaxant one to a half a tablet every 8 hours as needed for pain or spasms - Use a heating pad on lower back or muscle rub  Referral to Physical Therapy to work on strengthening of your low back. They will give you home exercises to work on, as this may only be covered for one visit by Medicaid. If you can get assistance to go to more sessions of therapy that would be beneficial  Please schedule a follow-up appointment in 4 weeks to follow-up in Low Back Pain / Sciatica  If you have any other questions or concerns, please feel free to call the clinic to contact me. You may also schedule an earlier appointment if necessary.  However, if your symptoms get significantly worse, please go to the Emergency Department to seek immediate medical attention.  Saralyn PilarAlexander Karamalegos, DO [facility]

## 2015-09-26 NOTE — Assessment & Plan Note (Signed)
>>  ASSESSMENT AND PLAN FOR PRIMARY HYPERTENSION WRITTEN ON 09/26/2015 11:28 AM BY KARAMALEGOS, ALEXANDER J, DO  BP stable today without any anti-HTN meds, stopped HCTZ on discharge. Currently in pain also, suggests patient may not meet criteria for dx HTN outside of acute pain episode/hospitalization. - Suspect Pre-HTN, monitor in clinic

## 2015-09-26 NOTE — Assessment & Plan Note (Signed)
Likely strong factor contributing to her back and knee pain, deconditioning. - Counseled on importance of weight loss, however patient will need to gradually recover from acute back flare before likely successful at weight loss

## 2015-09-26 NOTE — Assessment & Plan Note (Signed)
BP stable today without any anti-HTN meds, stopped HCTZ on discharge. Currently in pain also, suggests patient may not meet criteria for dx HTN outside of acute pain episode/hospitalization. - Suspect Pre-HTN, monitor in clinic

## 2015-09-26 NOTE — Assessment & Plan Note (Addendum)
Suspected underlying etiology of acute on chronic back pain, identified on CT L-spine in hospital. No acute symptoms of cauda equina or emergent nerve/spinal compression. - Continue conservative treatments, needs PT and strengthening with deconditioning - Future consider MRI Lumbar if not improving by 6 weeks (around 10/30/2015)

## 2015-09-26 NOTE — Assessment & Plan Note (Addendum)
Acute on chronic R LBP with associated R sciatica and underlying likely herniated disc, without known injury or trauma.  - No red flag symptoms. Negative SLR for radiculopathy - Mixed response to conservative therapy  Plan: 1. Ordered ambulatory Physical Therapy - likely only get 1 scheduled visit with medicaid, patient has transportation, will see if this helps and will need home exercises 2. Finish course Naproxen 500mg  BID x total 1 month, then PRN 3. Start Gabapentin 100mg  TID (titrate up) 4. Continue Flexeril take 5-10mg  up to TID PRN, titrate up as tolerated 5. May use Tylenol PRN for breakthrough 6. Encouraged use of heating pad 1-2x daily for now then PRN 7. RTC 6 weeks, re-evaluation. If not improved consider Lumbar MRI eval disc, and consider repeat prednisone longer burst, consider tramadol, caution opiates

## 2015-09-29 ENCOUNTER — Ambulatory Visit (INDEPENDENT_AMBULATORY_CARE_PROVIDER_SITE_OTHER): Payer: Medicaid Other | Admitting: Family Medicine

## 2015-09-29 ENCOUNTER — Encounter: Payer: Self-pay | Admitting: Family Medicine

## 2015-09-29 VITALS — BP 158/94 | Temp 98.5°F | Ht 67.0 in | Wt 308.1 lb

## 2015-09-29 DIAGNOSIS — I10 Essential (primary) hypertension: Secondary | ICD-10-CM

## 2015-09-29 DIAGNOSIS — M5441 Lumbago with sciatica, right side: Secondary | ICD-10-CM | POA: Diagnosis not present

## 2015-09-29 DIAGNOSIS — G8929 Other chronic pain: Secondary | ICD-10-CM

## 2015-09-29 DIAGNOSIS — Z0000 Encounter for general adult medical examination without abnormal findings: Secondary | ICD-10-CM

## 2015-09-29 DIAGNOSIS — Z114 Encounter for screening for human immunodeficiency virus [HIV]: Secondary | ICD-10-CM | POA: Diagnosis not present

## 2015-09-29 DIAGNOSIS — M25561 Pain in right knee: Secondary | ICD-10-CM

## 2015-09-29 DIAGNOSIS — M170 Bilateral primary osteoarthritis of knee: Secondary | ICD-10-CM

## 2015-09-29 DIAGNOSIS — L0293 Carbuncle, unspecified: Secondary | ICD-10-CM

## 2015-09-29 LAB — LIPID PANEL
CHOLESTEROL: 108 mg/dL — AB (ref 125–200)
HDL: 35 mg/dL — AB (ref >=46)
LDL CALC: 61 mg/dL (ref <130)
TRIGLYCERIDES: 61 mg/dL (ref <150)
Total CHOL/HDL Ratio: 3.1 Ratio (ref <=5.0)
VLDL: 12 mg/dL (ref <30)

## 2015-09-29 LAB — HIV ANTIBODY (ROUTINE TESTING W REFLEX): HIV 1&2 Ab, 4th Generation: NONREACTIVE

## 2015-09-29 LAB — POCT GLYCOSYLATED HEMOGLOBIN (HGB A1C): Hemoglobin A1C: 5.5

## 2015-09-29 NOTE — Assessment & Plan Note (Signed)
Stable to improved mildly, see note on 09/25/15 for details - Start PT as scheduled 10/04/15, may need to continue if covered - Continue NSAIDs, titrate Gabapentin - Follow-up as planned within 6 weeks

## 2015-09-29 NOTE — Patient Instructions (Addendum)
Thank you for coming in to clinic today.  1. For your Low Back Pain and Knee Pain - You may start using a Knee Sleeve for Compression (at drug store or [employer]) - Use ice for swelling (15 min at a time several times a day) or Heat for pain - Continue taking Naproxen 500mg  twice daily with food to finish another 2 weeks, then as needed - Start Tylenol 1000mg  up to 3 times a day most days for next 1-2 weeks then as needed - May try the Flexeril / Cyclobenzaprine muscle relaxant one to a half a tablet every 8 hours as needed for pain or spasms - Use a heating pad on lower back or muscle rub   Check labs today, results at next visit  [facility] department, anytime Monday to Friday business hours, walk in, no appointment results at next appointment  Referral to Dermatology for Skin Boils  Follow-up with Physical Therapy - tell them about your knees too, and try to follow-up for multiple visits  My 5 to Fitness!  5: fruits and vegetables per day (work on 9 per day if you are at 5) 4: exercise 4-5 times per week for at least 30 minutes (walking counts!) 3: meals per day (don't skip breakfast!), no more than 5 hours between meals 2: habits to quit -smoking -excess alcohol use (men >2 beer/day; women >1beer/day) 1: sweet per day (2 cookies, 1 small cup of ice cream, 12 oz soda)  These are general tips for healthy living. Try to start with 1 or 2 habit TODAY and make it a part of your life for several months. You set a goal today to work on:  Once you have 1 or 2 habits down for several months, try to begin working on your next healthy habit. With every single step you take, you will be leading a healthier lifestyle!   Diet Recommendations for Healthy Diet  REDUCE Starchy (carb) foods include: Bread, rice, pasta, potatoes, corn, crackers, bagels, muffins, all baked goods.   Protein foods include: Meat, fish, poultry, eggs, dairy foods, and  beans such as pinto and kidney beans (beans also provide carbohydrate).   1. Eat at least 3 meals and 1-2 snacks per day. Never go more than 4-5 hours while awake without eating.   2. Limit starchy foods to TWO per meal and ONE per snack. ONE portion of a starchy  food is equal to the following:   - ONE slice of bread (or its equivalent, such as half of a hamburger bun).   - 1/2 cup of a "scoopable" starchy food such as potatoes or rice.   - 1 OUNCE (28 grams) of starchy snacks (crackers or pretzels, look on label).   - 15 grams of carbohydrate as shown on food label.   3. Both lunch and dinner should include a protein food, a carb food, and vegetables.   - Obtain twice as many veg's as protein or carbohydrate foods for both lunch and dinner.   - Try to keep frozen veg's on hand for a quick vegetable serving.     - Fresh or frozen veg's are best.   4. Breakfast should always include protein.     Please schedule a follow-up appointment in 2 weeks for follow-up Right Knee pain, X-ray Results  If you have any other questions or concerns, please feel free to call the clinic to contact me. You may also  schedule an earlier appointment if necessary.  However, if your symptoms get significantly worse, please go to the Emergency Department to seek immediate medical attention.  Saralyn Pilar, DO [facility]
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
## 2015-09-29 NOTE — Assessment & Plan Note (Signed)
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 Offered topical anti-bacterial body wash vs consider bleach baths, patient interested in establishing with Dermatology, given chronic nature of this complaint. Referral Dermatology placed.

## 2015-09-29 NOTE — Assessment & Plan Note (Signed)
Secondary to R>L OA, chronic knee pain, concern possible R-meniscus injury (chronic) - See A&P under Osteoarthritis both knees - X-rays ordered
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
## 2015-09-29 NOTE — Assessment & Plan Note (Addendum)
Confirmed new diagnosis HTN, with recurrent readings at separate visits. Last visit stable, considered maybe just Elevated BP. No prior treatment.   Plan:  1. Offered to start anti-HTN treatment today (would resume HCTZ 12.5mg  daily that tried in hospital recently), however mutual decision to defer for 1-3 months, allow her to start her lifestyle changes with dietary changes, reduced sodas 2. Check A1c and fasting lipids given obesity and HTN - results A1c 5.5, not Pre-DM and lipids stable except low HDL 30s 3. Low salt diet, may check BP outside clinic 4. RTC 1-3 months

## 2015-09-29 NOTE — Assessment & Plan Note (Addendum)
Contributing to knee pain, discussed again - Proceed with lifestyle modifications - A1c 5.5, not in Pre-DM range, repeat screen q 6-12 months - Lipids stable but low HDL

## 2015-09-29 NOTE — Assessment & Plan Note (Signed)
>>  ASSESSMENT AND PLAN FOR PRIMARY HYPERTENSION WRITTEN ON 09/29/2015  9:18 PM BY KARAMALEGOS, ALEXANDER J, DO  Confirmed new diagnosis HTN, with recurrent readings at separate visits. Last visit stable, considered maybe just Elevated BP. No prior treatment.   Plan:  1. Offered to start anti-HTN treatment today (would resume HCTZ 12.5mg  daily that tried in hospital recently), however mutual decision to defer for 1-3 months, allow her to start her lifestyle changes with dietary changes, reduced sodas 2. Check A1c and fasting lipids given obesity and HTN - results A1c 5.5, not Pre-DM and lipids stable except low HDL 30s 3. Low salt diet, may check BP outside clinic 4. RTC 1-3 months

## 2015-10-04 ENCOUNTER — Ambulatory Visit: Payer: Medicaid Other | Attending: Family Medicine | Admitting: Physical Therapy

## 2015-10-14 ENCOUNTER — Other Ambulatory Visit: Payer: Self-pay | Admitting: Family Medicine

## 2015-10-14 DIAGNOSIS — M48061 Spinal stenosis, lumbar region without neurogenic claudication: Secondary | ICD-10-CM

## 2015-10-14 DIAGNOSIS — M5416 Radiculopathy, lumbar region: Principal | ICD-10-CM

## 2015-10-14 DIAGNOSIS — M170 Bilateral primary osteoarthritis of knee: Secondary | ICD-10-CM

## 2015-10-16 ENCOUNTER — Ambulatory Visit: Payer: Medicaid Other | Admitting: Family Medicine

## 2015-10-24 ENCOUNTER — Ambulatory Visit: Payer: Medicaid Other | Admitting: Family Medicine

## 2015-10-25 ENCOUNTER — Ambulatory Visit: Payer: Medicaid Other | Admitting: Physical Therapy

## 2015-11-10 ENCOUNTER — Ambulatory Visit: Payer: Medicaid Other | Attending: Family Medicine | Admitting: Physical Therapy

## 2015-11-17 ENCOUNTER — Ambulatory Visit (HOSPITAL_COMMUNITY)
Admission: RE | Admit: 2015-11-17 | Discharge: 2015-11-17 | Disposition: A | Discharge status: Home / Self Care | Payer: Medicaid Other | Source: Ambulatory Visit | Attending: Family Medicine | Admitting: Family Medicine

## 2015-11-17 ENCOUNTER — Other Ambulatory Visit: Payer: Self-pay | Admitting: Family Medicine

## 2015-11-17 DIAGNOSIS — M25562 Pain in left knee: Secondary | ICD-10-CM | POA: Insufficient documentation

## 2015-11-17 DIAGNOSIS — M25561 Pain in right knee: Secondary | ICD-10-CM

## 2015-11-17 DIAGNOSIS — G8929 Other chronic pain: Secondary | ICD-10-CM

## 2015-11-17 DIAGNOSIS — M170 Bilateral primary osteoarthritis of knee: Secondary | ICD-10-CM

## 2015-11-21 ENCOUNTER — Telehealth: Payer: Self-pay | Admitting: *Deleted

## 2015-11-21 DIAGNOSIS — M25561 Pain in right knee: Secondary | ICD-10-CM

## 2015-11-21 DIAGNOSIS — M170 Bilateral primary osteoarthritis of knee: Secondary | ICD-10-CM

## 2015-11-21 NOTE — Telephone Encounter (Signed)
Patient requesting to speak with MD regarding recent xray results.

## 2015-11-21 NOTE — Telephone Encounter (Signed)
Pt returns call, best number to reach her at is [phone]. Fleeger, Shirley RochesterJessica [name], CMA
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
## 2015-11-22 NOTE — Telephone Encounter (Signed)
Please contact patient back to explain results of xrays
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
## 2016-02-13 ENCOUNTER — Encounter (HOSPITAL_COMMUNITY): Payer: Self-pay | Admitting: Emergency Medicine

## 2016-02-13 ENCOUNTER — Emergency Department (HOSPITAL_COMMUNITY)
Admission: EM | Admit: 2016-02-13 | Discharge: 2016-02-13 | Disposition: A | Discharge status: Home / Self Care | Payer: Medicaid Other | Attending: Emergency Medicine | Admitting: Emergency Medicine

## 2016-02-13 ENCOUNTER — Emergency Department (HOSPITAL_COMMUNITY): Payer: Medicaid Other

## 2016-02-13 DIAGNOSIS — I10 Essential (primary) hypertension: Secondary | ICD-10-CM | POA: Insufficient documentation

## 2016-02-13 DIAGNOSIS — Y939 Activity, unspecified: Secondary | ICD-10-CM | POA: Diagnosis not present

## 2016-02-13 DIAGNOSIS — Z79899 Other long term (current) drug therapy: Secondary | ICD-10-CM | POA: Diagnosis not present

## 2016-02-13 DIAGNOSIS — S8992XA Unspecified injury of left lower leg, initial encounter: Secondary | ICD-10-CM | POA: Diagnosis present

## 2016-02-13 DIAGNOSIS — S83412A Sprain of medial collateral ligament of left knee, initial encounter: Secondary | ICD-10-CM

## 2016-02-13 DIAGNOSIS — Y999 Unspecified external cause status: Secondary | ICD-10-CM | POA: Insufficient documentation

## 2016-02-13 DIAGNOSIS — Y92009 Unspecified place in unspecified non-institutional (private) residence as the place of occurrence of the external cause: Secondary | ICD-10-CM | POA: Diagnosis not present

## 2016-02-13 DIAGNOSIS — W010XXA Fall on same level from slipping, tripping and stumbling without subsequent striking against object, initial encounter: Secondary | ICD-10-CM | POA: Diagnosis not present

## 2016-02-13 DIAGNOSIS — Z87891 Personal history of nicotine dependence: Secondary | ICD-10-CM | POA: Diagnosis not present

## 2016-02-13 DIAGNOSIS — Q181 Preauricular sinus and cyst: Secondary | ICD-10-CM | POA: Diagnosis not present

## 2016-02-13 HISTORY — DX: Obesity, unspecified: E66.9

## 2016-02-13 HISTORY — DX: Unspecified osteoarthritis, unspecified site: M19.90

## 2016-02-13 MED ORDER — OXYCODONE-ACETAMINOPHEN 5-325 MG PO TABS: 1.0000 | ORAL_TABLET | ORAL | 0 refills | Status: DC | PRN

## 2016-02-13 MED ORDER — IBUPROFEN 800 MG PO TABS
800.0000 mg | ORAL_TABLET | Freq: Once | ORAL | Status: AC
  Administered 2016-02-13: 800 mg via ORAL
  Filled 2016-02-13: qty 1

## 2016-02-13 MED ORDER — NAPROXEN 500 MG PO TABS: 500.0000 mg | ORAL_TABLET | Freq: Two times a day (BID) | ORAL | 0 refills | Status: DC

## 2016-02-13 NOTE — Discharge Instructions (Signed)
Use the knee immobilizer as needed. Apply warm compresses to the cyst by your ear. If it is getting larger, then see the ENT physician.

## 2016-02-13 NOTE — ED Triage Notes (Signed)
Pt. slipped and fell at home today , reports left knee pain with mild swelling , ambulatory , deneis LOC .

## 2016-02-13 NOTE — ED Provider Notes (Signed)
[facility]-EMERGENCY DEPT Provider Note   CSN: [id number] Arrival date & time: 02/13/16  0151     History   Chief Complaint Chief Complaint  Patient presents with  . Knee Pain    HPI Shirley [name] is a 25 y.o. female.  The history is provided by the patient.  Knee Pain    She slipped and injured her left knee earlier today. She is complaining of pain in the knee which he rates at 8/10. She denies other injury. As a separate complaint, she noted a bump in front of her left ear. Does not painful. She just wants to know what it is.  Past Medical History:  Diagnosis Date  . Arthritis   . Hypertension   . No significant past medical history   . Obesity     Patient Active Problem List   Diagnosis Date Noted  . Osteoarthritis of both knees 09/29/2015  . 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  . Low back pain with right-sided sciatica 09/15/2015  . 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 Submandibular abscess 10/18/2013  . OBESITY, MORBID 12/02/2006  . Recurrent boils 10/21/2006  . Knee pain, chronic 09/04/2006  . HTN (hypertension) 08/28/2006  . 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 TOOTH EXTRACTION N/A 10/22/2013   Procedure: DENTAL EXTRACTIONS TEETH #1, 16, 17, 32;  Surgeon: Georgia LopesScott M Jensen, DDS;  Location: [facility] OR;  Service: Oral Surgery;  Laterality: N/A;    OB History    Gravida Para Term Preterm AB Living   3       1 2    SAB TAB Ectopic Multiple Live Births           2       Home Medications    Prior to Admission medications   Medication Sig Start Date End Date Taking? Authorizing Provider  acetaminophen (TYLENOL) 500 MG tablet Take 1-2 tablets (500-1,000 mg total) by mouth every 6 (six) hours as needed for mild pain or moderate pain. 09/17/15   Smitty CordsAlexander J Karamalegos, DO  cyclobenzaprine (FLEXERIL) 10 MG tablet Take 0.5-1 tablets (5-10 mg total) by mouth 3 (three) times daily as needed for muscle spasms.  09/17/15   Smitty CordsAlexander J Karamalegos, DO  gabapentin (NEURONTIN) 100 MG capsule Take 1 capsule (100 mg total) by mouth 3 (three) times daily. 09/25/15   Smitty CordsAlexander J Karamalegos, DO  naproxen (NAPROSYN) 500 MG tablet TAKE 1 TABLET (500 MG TOTAL) BY MOUTH 2 (TWO) TIMES DAILY WITH A MEAL. FOR 2 WEEKS, THEN AS NEEDED. 10/16/15   Smitty CordsAlexander J Karamalegos, DO    Family History Family History  Problem Relation Age of Onset  . Hypertension Mother   . Diabetes Maternal Grandmother   . Diabetes Paternal Grandmother   . Stroke Paternal Grandmother     Social History Social History  Substance Use Topics  . Smoking status: Former Smoker    Packs/day: 0.00    Quit date: 09/15/2015  . Smokeless tobacco: Never Used  . Alcohol use 0.0 oz/week     Comment: rare     Allergies   Review of patient's allergies indicates no known allergies.   Review of Systems Review of Systems  All other systems reviewed and are negative.    Physical Exam Updated Vital Signs BP (!) 151/102 (BP Location: Right Arm)   Pulse 72   Temp 98.3 F (36.8 C) (Oral)   Resp 16   LMP 01/12/2016 (Approximate)   SpO2 99%  Physical Exam  Nursing note and vitals reviewed.  Obese 25 year old female, resting comfortably and in no acute distress. Vital signs are significant for hypertension. Oxygen saturation is 99%, which is normal. Head is normocephalic and atraumatic. PERRLA, EOMI. Oropharynx is clear. Neck is nontender and supple without adenopathy or JVD. Back is nontender and there is no CVA tenderness. Lungs are clear without rales, wheezes, or rhonchi. Chest is nontender. Heart has regular rate and rhythm without murmur. Abdomen is soft, flat, nontender without masses or hepatosplenomegaly and peristalsis is normoactive. Extremities: Moderate tenderness in the medial aspect of the left knee. Pain is elicited with valgus stress but not very stress. There is no tenderness to palpation anteriorly, posteriorly, or  laterally. No instability is seen. Anterior drawer sign is negative. She will not cooperate for McMurray's testing. Skin is warm and dry without rash. Neurologic: Mental status is normal, cranial nerves are intact, there are no motor or sensory deficits.  ED Treatments / Results   Radiology Dg Knee Complete 4 Views Left  Result Date: 02/13/2016 CLINICAL DATA:  Acute onset of medial left knee pain after twisting injury. Initial encounter. EXAM: LEFT KNEE - COMPLETE 4+ VIEW COMPARISON:  Left knee radiographs from 11/17/2015 FINDINGS: There is no evidence of fracture or dislocation. The joint spaces are preserved. No significant degenerative change is seen; the patellofemoral joint is grossly unremarkable in appearance. A small accessory ossicle is noted at the distal patellar tendon. No significant joint effusion is seen. The visualized soft tissues are normal in appearance. IMPRESSION: No evidence of fracture or dislocation. Electronically Signed   By: Roanna RaiderJeffery  Chang M.D.   On: 02/13/2016 02:44    Procedures Procedures (including critical care time)  Medications Ordered in ED Medications - No data to display   Initial Impression / Assessment and Plan / ED Course  I have reviewed the triage vital signs and the nursing notes.  Pertinent imaging results that were available during my care of the patient were reviewed by me and considered in my medical decision making (see chart for details).  Clinical Course   Fall with sprain of the medial collateral ligament of the left knee, possible occult meniscus injury. She is placed in the immobilizer, given crutches and sent home with prescription for naproxen and also a small number of oxycodone have acetaminophen. She is to follow-up with her physician in family practice center. Cyst in the left preauricular area. No clear evidence of abscess formation at this point. Patient is advised to apply warm compresses and follow-up with ENT if it seems to be  enlarging.  Final Clinical Impressions(s) / ED Diagnoses   Final diagnoses:  None    New Prescriptions New Prescriptions   No medications on file     Dione Boozeavid Magic Mohler, MD 02/13/16 0505
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
## 2016-02-20 ENCOUNTER — Encounter: Payer: Self-pay | Admitting: Family Medicine

## 2016-02-20 ENCOUNTER — Ambulatory Visit (INDEPENDENT_AMBULATORY_CARE_PROVIDER_SITE_OTHER): Payer: Medicaid Other | Admitting: Family Medicine

## 2016-02-20 VITALS — BP 168/112 | HR 63 | Temp 98.0°F | Ht 67.0 in | Wt 309.4 lb

## 2016-02-20 DIAGNOSIS — M25562 Pain in left knee: Secondary | ICD-10-CM

## 2016-02-20 DIAGNOSIS — G8929 Other chronic pain: Secondary | ICD-10-CM | POA: Diagnosis not present

## 2016-02-20 DIAGNOSIS — I10 Essential (primary) hypertension: Secondary | ICD-10-CM

## 2016-02-20 DIAGNOSIS — M25561 Pain in right knee: Secondary | ICD-10-CM

## 2016-02-20 MED ORDER — HYDROCHLOROTHIAZIDE 12.5 MG PO CAPS: 12.5000 mg | ORAL_CAPSULE | Freq: Every day | ORAL | 3 refills | Status: DC

## 2016-02-20 NOTE — Assessment & Plan Note (Signed)
-  Initial blood pressure 176/122. Similar reading on repeat. -Will initiate hydrochlorothiazide 12.5 mg daily. Pharmacy discussed medication. -Follow-up in 2 weeks to recheck blood pressure. Return precautions given.

## 2016-02-20 NOTE — Patient Instructions (Addendum)
Thank you so much for coming to visit today! Suspect her knee pain secondary to a meniscal tear possibly from degeneration. Please take naproxen scheduled as discussed. I have given you some exercises to help with the pain. I have placed a referral to orthopedics and physical therapy. Your blood pressure was elevated today. I have sent a prescription for hydrochlorothiazide 12.5 mg to take daily. Please let us know if you experience any headaches, chest pain, blurred vision, weakness. Please follow-up in 2 weeks to discuss your blood pressure and knee pain. Dr. Caroleen Hammanumley

## 2016-02-20 NOTE — Assessment & Plan Note (Signed)
>>  ASSESSMENT AND PLAN FOR PRIMARY HYPERTENSION WRITTEN ON 02/20/2016  6:04 PM BY RUMLEY, [place] N, DO  -Initial blood pressure 176/122. Similar reading on repeat. -Will initiate hydrochlorothiazide  12.5 mg daily. Pharmacy discussed medication. -Follow-up in 2 weeks to recheck blood pressure. Return precautions given.

## 2016-02-20 NOTE — Assessment & Plan Note (Addendum)
-  Suspect bilateral meniscus tears due to weight and degeneration -Reports resolution of knee pain documented at emergency department visit. Pain now at baseline. -Orthopedics referral placed -Physical therapy referral placed -Handout on exercises to help with knee pain associated with meniscal tears given -Naproxen 500 mg twice daily -Recommend against use of knee immobilizer -Follow-up with PCP

## 2016-03-11 ENCOUNTER — Ambulatory Visit: Payer: Medicaid Other | Attending: Family Medicine | Admitting: Physical Therapy

## 2016-03-19 ENCOUNTER — Ambulatory Visit: Payer: Medicaid Other | Admitting: Physical Therapy

## 2016-04-05 ENCOUNTER — Ambulatory Visit: Payer: Medicaid Other | Attending: Family Medicine | Admitting: Physical Therapy

## 2016-04-07 ENCOUNTER — Encounter (HOSPITAL_COMMUNITY): Payer: Self-pay | Admitting: Emergency Medicine

## 2016-04-07 ENCOUNTER — Emergency Department (HOSPITAL_COMMUNITY)
Admission: EM | Admit: 2016-04-07 | Discharge: 2016-04-07 | Disposition: A | Discharge status: Home / Self Care | Payer: Medicaid Other | Attending: Emergency Medicine | Admitting: Emergency Medicine

## 2016-04-07 DIAGNOSIS — T22111A Burn of first degree of right forearm, initial encounter: Secondary | ICD-10-CM

## 2016-04-07 DIAGNOSIS — Y93G3 Activity, cooking and baking: Secondary | ICD-10-CM | POA: Diagnosis not present

## 2016-04-07 DIAGNOSIS — Y999 Unspecified external cause status: Secondary | ICD-10-CM | POA: Insufficient documentation

## 2016-04-07 DIAGNOSIS — X12XXXA Contact with other hot fluids, initial encounter: Secondary | ICD-10-CM | POA: Insufficient documentation

## 2016-04-07 DIAGNOSIS — I10 Essential (primary) hypertension: Secondary | ICD-10-CM | POA: Insufficient documentation

## 2016-04-07 DIAGNOSIS — Z79899 Other long term (current) drug therapy: Secondary | ICD-10-CM | POA: Diagnosis not present

## 2016-04-07 DIAGNOSIS — Y929 Unspecified place or not applicable: Secondary | ICD-10-CM | POA: Diagnosis not present

## 2016-04-07 MED ORDER — SILVER SULFADIAZINE 1 % EX CREA
TOPICAL_CREAM | Freq: Once | CUTANEOUS | Status: AC
  Administered 2016-04-07: 22:00:00 via TOPICAL
  Filled 2016-04-07: qty 85

## 2016-04-07 MED ORDER — TRAMADOL HCL 50 MG PO TABS: 50.0000 mg | ORAL_TABLET | Freq: Four times a day (QID) | ORAL | 0 refills | Status: DC | PRN

## 2016-04-07 MED ORDER — OXYCODONE-ACETAMINOPHEN 5-325 MG PO TABS
1.0000 | ORAL_TABLET | Freq: Once | ORAL | Status: AC
  Administered 2016-04-07: 1 via ORAL
  Filled 2016-04-07: qty 1
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
## 2016-04-07 NOTE — ED Notes (Signed)
Pt trying to wash butter off.  States she thinks butter was making it hurt worse.

## 2016-04-07 NOTE — ED Notes (Signed)
See providers assessment.  
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
## 2016-04-29 ENCOUNTER — Ambulatory Visit: Payer: Medicaid Other | Admitting: Family Medicine

## 2016-04-30 ENCOUNTER — Encounter: Payer: Self-pay | Admitting: Family Medicine

## 2016-04-30 ENCOUNTER — Telehealth: Payer: Self-pay | Admitting: Family Medicine

## 2016-04-30 ENCOUNTER — Ambulatory Visit (INDEPENDENT_AMBULATORY_CARE_PROVIDER_SITE_OTHER): Payer: Medicaid Other | Admitting: Family Medicine

## 2016-04-30 DIAGNOSIS — M5431 Sciatica, right side: Secondary | ICD-10-CM

## 2016-04-30 DIAGNOSIS — I10 Essential (primary) hypertension: Secondary | ICD-10-CM

## 2016-04-30 MED ORDER — NAPROXEN 500 MG PO TABS: 500.0000 mg | ORAL_TABLET | Freq: Two times a day (BID) | ORAL | 0 refills | Status: DC

## 2016-04-30 MED ORDER — GABAPENTIN 100 MG PO CAPS: 100.0000 mg | ORAL_CAPSULE | Freq: Three times a day (TID) | ORAL | 3 refills | Status: DC

## 2016-04-30 NOTE — Progress Notes (Signed)
Subjective:   Patient ID: Shirley [name]    DOB: 03-22-1991, 25 y.o. female   MRN: [id number]  CC: Right Lower Back Pain  HPI: Shirley [name] is a 25 y.o. female who presents to clinic today right lower back pain. Problems discussed today are as follows:  1. Right Lower Back Pain: has slipped disc diagnosed earlier this month. [facility] and given Naprosyn with some relief. Was also seen at ED on 04/07/16 and d/c on tramadol without relief. Says she is experiencing "pain all over." Pain originates at right lower back and radiates to lateral right calf with throbbing characteristic 8/10 severity. Patient says she "feels like the pain is in her nerves." Denies claudication, saddle anesthesia, urinary or bowel incontinence.  2. Hypertension: patient on HCTZ 12.5 mg. Says she has not been taking her doses because she forgets. Did not take her dose this AM. Denies chest pain, dyspnea, nausea, vomiting, headache.   ROS: Complete ROS performed, see HPI for pertinent ROS.  PMFSH: Pertinent past medical, surgical, family, and social history were reviewed and updated as appropriate. Smoking status reviewed.  Medications reviewed. Current Outpatient Prescriptions  Medication Sig Dispense Refill  . gabapentin (NEURONTIN) 100 MG capsule Take 1 capsule (100 mg total) by mouth 3 (three) times daily. 90 capsule 3  . hydrochlorothiazide (MICROZIDE) 12.5 MG capsule Take 1 capsule (12.5 mg total) by mouth daily. (Patient not taking: Reported on 04/30/2016) 90 capsule 3  . naproxen (NAPROSYN) 500 MG tablet Take 1 tablet (500 mg total) by mouth 2 (two) times daily. 30 tablet 0  . traMADol (ULTRAM) 50 MG tablet Take 1 tablet (50 mg total) by mouth every 6 (six) hours as needed. (Patient not taking: Reported on 04/30/2016) 15 tablet 0   No current facility-administered medications for this visit.     Objective:   BP (!) 168/105   Pulse (!) 57   Temp 97.9 F (36.6 C) (Oral)   Wt (!)  308 lb 3.2 oz (139.8 kg)   LMP 04/23/2016   BMI 48.27 kg/m  Vitals and nursing note reviewed.  General: morbidly obese, well nourished, well developed, mild acute distress with non-toxic appearance HEENT: normocephalic, atraumatic, moist mucous membranes Neck: supple, non-tender without lymphadenopathy CV: regular rate and rhythm without murmurs, rubs, or gallops, no lower extremity edema Lungs: clear to auscultation bilaterally with normal work of breathing Abdomen: soft, non-tender, non-distended, no masses or organomegaly palpable, normoactive bowel sounds Skin: warm, dry, no rashes or lesions, cap refill < 2 seconds Extremities: warm and well perfused, normal tone, 5/5 motor strength all 4 extremities, decreased sensation on right lateral calf, tenderness on right SI joint, gait is abnormal favoring left foot  Assessment & Plan:   HTN (hypertension) Uncontrolled. BP elevated at 168/105. Not adherent to antihypertensive therapy. Misses doses often.  - Explained importance for medications and controlling BP - Educated on avoid high salt intake  Sciatica neuralgia Uncontrolled. Right sided pain down to lateral right calf. Able to ambulate but appears to be in pain due to lower right back. No spinal claudication symptoms. Pt requesting pain medications. - Naprosyn 500 mg BID for 15 days given - Restarted Gabapentin 100 mg TID and told to titrate up 100 mg each dose every week unless pt becomes drowsy or weak  No orders of the defined types were placed in this encounter.  Meds ordered this encounter  Medications  . naproxen (NAPROSYN) 500 MG tablet    Sig:  Take 1 tablet (500 mg total) by mouth 2 (two) times daily.    Dispense:  30 tablet    Refill:  0  . gabapentin (NEURONTIN) 100 MG capsule    Sig: Take 1 capsule (100 mg total) by mouth 3 (three) times daily.    Dispense:  90 capsule    Refill:  3    Durward Parcelavid Savvas Roper, DO [facility], PGY-1 04/30/2016 2:03  PM

## 2016-04-30 NOTE — Assessment & Plan Note (Signed)
>>  ASSESSMENT AND PLAN FOR PRIMARY HYPERTENSION WRITTEN ON 04/30/2016  1:59 PM BY MCMULLEN, DAVID J, DO  Uncontrolled. BP elevated at 168/105. Not adherent to antihypertensive therapy. Misses doses often.  - Explained importance for medications and controlling BP - Educated on avoid high salt intake

## 2016-04-30 NOTE — Assessment & Plan Note (Signed)
Uncontrolled. BP elevated at 168/105. Not adherent to antihypertensive therapy. Misses doses often.  - Explained importance for medications and controlling BP - Educated on avoid high salt intake

## 2016-04-30 NOTE — Assessment & Plan Note (Addendum)
Uncontrolled. Right sided pain down to lateral right calf. Able to ambulate but appears to be in pain due to lower right back. No spinal claudication symptoms. Pt requesting pain medications. - Naprosyn 500 mg BID for 15 days given - Restarted Gabapentin 100 mg TID and told to titrate up 100 mg each dose every week unless pt becomes drowsy or weak - F/u in 1 month

## 2016-04-30 NOTE — Patient Instructions (Addendum)
It was a pleasure to meet you today. Please see below to review our plan for today's visit.  1. Your pain is not controlled. I will provide you with a refill of your Naprosyn. Take this twice daily. We will recheck you pain in 1 month. 2. I have refilled you gabapentin. Take 100 mg three times per day. You can increase this by another 100 mg every few days if you do not feel tired or drowsy. Its important to remember it takes time for it to work. 3. Please take your HCTZ every day. 3. Please return in 1 month.  Please call the clinic at [phone] if your symptoms worsen or you have any concerns. It was my pleasure to see you. -- Durward Parcelavid Kiandra Sanguinetti, DO [facility], PGY-1  Gabapentin capsules or tablets What is this medicine? GABAPENTIN (GA ba pen tin) is used to control partial seizures in adults with epilepsy. It is also used to treat certain types of nerve pain. This medicine may be used for other purposes; ask your health care provider or pharmacist if you have questions. What should I tell my health care provider before I take this medicine? They need to know if you have any of these conditions: -kidney disease -suicidal thoughts, plans, or attempt; a previous suicide attempt by you or a family member -an unusual or allergic reaction to gabapentin, other medicines, foods, dyes, or preservatives -pregnant or trying to get pregnant -breast-feeding How should I use this medicine? Take this medicine by mouth with a glass of water. Follow the directions on the prescription label. You can take it with or without food. If it upsets your stomach, take it with food.Take your medicine at regular intervals. Do not take it more often than directed. Do not stop taking except on your doctor's advice. If you are directed to break the 600 or 800 mg tablets in half as part of your dose, the extra half tablet should be used for the next dose. If you have not used the extra half tablet  within 28 days, it should be thrown away. A special MedGuide will be given to you by the pharmacist with each prescription and refill. Be sure to read this information carefully each time. Talk to your pediatrician regarding the use of this medicine in children. Special care may be needed. Overdosage: If you think you have taken too much of this medicine contact a poison control center or emergency room at once. NOTE: This medicine is only for you. Do not share this medicine with others. What if I miss a dose? If you miss a dose, take it as soon as you can. If it is almost time for your next dose, take only that dose. Do not take double or extra doses. What may interact with this medicine? Do not take this medicine with any of the following medications: -other gabapentin products This medicine may also interact with the following medications: -alcohol -antacids -antihistamines for allergy, cough and cold -certain medicines for anxiety or sleep -certain medicines for depression or psychotic disturbances -homatropine; hydrocodone -naproxen -narcotic medicines (opiates) for pain -phenothiazines like chlorpromazine, mesoridazine, prochlorperazine, thioridazine This list may not describe all possible interactions. Give your health care provider a list of all the medicines, herbs, non-prescription drugs, or dietary supplements you use. Also tell them if you smoke, drink alcohol, or use illegal drugs. Some items may interact with your medicine. What should I watch for while using this medicine? Visit your doctor or  health care professional for regular checks on your progress. You may want to keep a record at home of how you feel your condition is responding to treatment. You may want to share this information with your doctor or health care professional at each visit. You should contact your doctor or health care professional if your seizures get worse or if you have any new types of seizures. Do not  stop taking this medicine or any of your seizure medicines unless instructed by your doctor or health care professional. Stopping your medicine suddenly can increase your seizures or their severity. Wear a medical identification bracelet or chain if you are taking this medicine for seizures, and carry a card that lists all your medications. You may get drowsy, dizzy, or have blurred vision. Do not drive, use machinery, or do anything that needs mental alertness until you know how this medicine affects you. To reduce dizzy or fainting spells, do not sit or stand up quickly, especially if you are an older patient. Alcohol can increase drowsiness and dizziness. Avoid alcoholic drinks. Your mouth may get dry. Chewing sugarless gum or sucking hard candy, and drinking plenty of water will help. The use of this medicine may increase the chance of suicidal thoughts or actions. Pay special attention to how you are responding while on this medicine. Any worsening of mood, or thoughts of suicide or dying should be reported to your health care professional right away. Women who become pregnant while using this medicine may enroll in the [place] Antiepileptic Drug Pregnancy Registry by calling [phone]. This registry collects information about the safety of antiepileptic drug use during pregnancy. What side effects may I notice from receiving this medicine? Side effects that you should report to your doctor or health care professional as soon as possible: -allergic reactions like skin rash, itching or hives, swelling of the face, lips, or tongue -worsening of mood, thoughts or actions of suicide or dying Side effects that usually do not require medical attention (report to your doctor or health care professional if they continue or are bothersome): -constipation -difficulty walking or controlling muscle movements -dizziness -nausea -slurred speech -tiredness -tremors -weight gain This list may not  describe all possible side effects. Call your doctor for medical advice about side effects. You may report side effects to FDA at 1-800-FDA-1088. Where should I keep my medicine? Keep out of reach of children. This medicine may cause accidental overdose and death if it taken by other adults, children, or pets. Mix any unused medicine with a substance like cat litter or coffee grounds. Then throw the medicine away in a sealed container like a sealed bag or a coffee can with a lid. Do not use the medicine after the expiration date. Store at room temperature between 15 and 30 degrees C (59 and 86 degrees F). NOTE: This sheet is a summary. It may not cover all possible information. If you have questions about this medicine, talk to your doctor, pharmacist, or health care provider.    2016, Elsevier/Gold Standard. (2013-08-13 15:26:50)

## 2016-04-30 NOTE — Telephone Encounter (Signed)
Patient wanted to request Medical Records but she didn't have a valid identity card (ID or Radiation protection practitionerDriver License). Patient insisted how she was registered for her appointment with her PCP and I explained she was asked  some questions but wasn't requested an identity card. Also, I explained patient I need to record her ID or DL number. Patient went back to talk with PCP, because she doesn't have any ID.

## 2016-05-07 NOTE — Telephone Encounter (Signed)
Rosa mentioned this but I don't really see any request with it so I am closing it. Lamonte SakaiZimmerman Rumple, April D, [place]

## 2016-05-15 ENCOUNTER — Telehealth: Payer: Self-pay | Admitting: *Deleted

## 2016-05-15 NOTE — Telephone Encounter (Signed)
Patient states that she was told by provider to call back and let him know how the naproxen and gabapentin were working.  Patient states that they are not helping and would like a call back to discuss changing these medications or just get something else called in for her.  She was also wanting to get a copy of medical records for [facility].  I informed her that she would need to come into the office to sign the release and also there would be a charge for her "whole record" to be released to her.  Patient was not pleased with this answer since she thought she would get a copy of this today.  She opted to have the ROI mailed to her to fill out.  Patient was also informed that it is about a 2 week turn around once the records have been requested for her to actually receive them.  Will forward to Md to advise on medication changes.  Maurissa Ambrose,CMA

## 2016-05-27 ENCOUNTER — Encounter: Payer: Self-pay | Admitting: Family Medicine

## 2016-05-27 ENCOUNTER — Ambulatory Visit (INDEPENDENT_AMBULATORY_CARE_PROVIDER_SITE_OTHER): Payer: Medicaid Other | Admitting: Family Medicine

## 2016-05-27 VITALS — BP 154/100 | HR 73 | Temp 98.0°F | Wt 311.4 lb

## 2016-05-27 DIAGNOSIS — M5441 Lumbago with sciatica, right side: Secondary | ICD-10-CM | POA: Diagnosis present

## 2016-05-27 DIAGNOSIS — I10 Essential (primary) hypertension: Secondary | ICD-10-CM

## 2016-05-27 DIAGNOSIS — G8929 Other chronic pain: Secondary | ICD-10-CM

## 2016-05-27 MED ORDER — DULOXETINE HCL 30 MG PO CPEP
30.0000 mg | ORAL_CAPSULE | Freq: Every day | ORAL | 2 refills | Status: DC
Start: 2016-05-27 — End: 2016-09-06

## 2016-05-27 NOTE — Progress Notes (Signed)
Subjective:   Patient ID: Shirley [name]    DOB: 02-Jul-1990, 25 y.o. female   MRN: [id number]  CC: "Right leg pain"  HPI: Shirley RepressSacoya M Labella is a 25 y.o. female who presents to clinic today for right leg pain. Problems discussed today are as follows:  1. Right Leg Pain: ongoing issue for patient. Has long h/o knee problems. Says she has old injuries to both knees and they both click with movement. Right leg has been problematic for patient. Painful with achy and sharp 10/10 pain radiating from right lumbar to right foot. Says she has weakness and fells like her leg gives out occasionally. Very problematic given her work and having to stand for prolonged periods of time. Says she has PT appointment tomorrow but wants pain relief. Patient has been trying to make efforts to loose weight but finds it difficult given pain at right leg and lumbar region. Denies saddle anesthesia, or bowel/bladder incontinence.  2. Hypertension: takes her HCTZ but occasionally misses a dose. Says she took her BP med this AM around 0930. Denies h/a or LE edema. Says she has increased urination when on the pill. Has not been making efforts to reduce salt intake.  ROS: Complete ROS performed, see HPI for pertinent ROS.  PMFSH: Pertinent past medical, surgical, family, and social history were reviewed and updated as appropriate. Smoking status reviewed.  Medications reviewed. Current Outpatient Prescriptions  Medication Sig Dispense Refill  . hydrochlorothiazide (MICROZIDE) 12.5 MG capsule Take 1 capsule (12.5 mg total) by mouth daily. 90 capsule 3  . DULoxetine (CYMBALTA) 30 MG capsule Take 1 capsule (30 mg total) by mouth daily. Take 1 capsule daily for 1 week, then increase to 2 capsules daily. 90 capsule 2   No current facility-administered medications for this visit.     Objective:   BP (!) 154/100   Pulse 73   Temp 98 F (36.7 C) (Oral)   Wt (!) 311 lb 6.4 oz (141.3 kg)   LMP 05/24/2016 (Exact Date)    BMI 48.77 kg/m  Vitals and nursing note reviewed.  General: morbidly obese, well nourished, well developed, mild acute distress on manipulation of right leg with ROM and tender throughout on palpation with non-toxic appearance HEENT: normocephalic, atraumatic, moist mucous membranes CV: regular rate and rhythm without murmurs, rubs, or gallops, no lower extremity edema Lungs: clear to auscultation bilaterally with normal work of breathing Abdomen: soft, non-tender, non-distended, normoactive bowel sounds Skin: warm, dry, no rashes or lesions, cap refill < 2 seconds Extremities: warm and well perfused, normal tone, 4/5 motor strength RLE, 5/5 motor strength LLE and upper extremities, some decrease sensation on lateral portion of right thigh extending to knee, FROM with passive ROM of both legs, crepitus at knees bilaterally with movement, calf size symmetrical   Assessment & Plan:   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 Uncontrolled. Spares left leg. Seems to have radicular symptoms. No red flags concerning cauda equina syndrome. CT on 08/2015 showed mild disc bulge with degenerative facet hypertrophy causing mod b/l neural foramen stenoses with possible associated nerve root impingement. Has failed NSAIDs, tramadol, gabapentin. --Gabapentin d/c, switched to Cymbalta 30 mg QD for 1 weeks then 60 mg QD --Pt educated on need for 6-8 weeks for full effect --Could continue using tylenol PRN for pain --If fails, could try higher dose gabapentin up to 1500 mg QD given pt only titrated up to 600 mg QD --Will obtain MRI lumbar spine to better visualize nerve impingement  and if pt would benefit from surgery --Pt to go to PT on 11/28 --Would benefit from weight loss  HTN (hypertension) Chronic. Uncontrolled. Takes thiazide, but occasionally non-compliant. Obvious uncontrolled pain on exam could contribute to elevations in BP. BP 154/100 in office.  --Continue HCTZ 12.5 mg QD --Will work  towards better pain control as this may be contributing to high BP --Educated on reducing sodium intake --If continues to be uncontrolled, may need to increase HCTZ or add additional agent  Orders Placed This Encounter  Procedures  . MR Lumbar Spine Wo Contrast    Standing Status:   Future    Standing Expiration Date:   07/27/2017    Order Specific Question:   Reason for Exam (SYMPTOM  OR DIAGNOSIS REQUIRED)    Answer:   Back pain with right leg radiculopathy    Order Specific Question:   [facility]?    [facility]    Order Specific Question:   What is the patient's sedation requirement?    Answer:   No Sedation    Order Specific Question:   Does the patient have a pacemaker or implanted devices?    Answer:   No   Meds ordered this encounter  Medications  . DULoxetine (CYMBALTA) 30 MG capsule    Sig: Take 1 capsule (30 mg total) by mouth daily. Take 1 capsule daily for 1 week, then increase to 2 capsules daily.    Dispense:  90 capsule    Refill:  2    Durward Parcelavid Kyrese Gartman, DO [facility], PGY-1 05/28/2016 10:18 AM

## 2016-05-27 NOTE — Patient Instructions (Signed)
It was a pleasure to meet you today. Please see below to review our plan for today's visit.   1. I have scheduled a lumbar spine MRI. They will call you to schedule the appointment. You will be notified of your results. 2. Please stop the gabapentin and Toradol and take the Cymbalta as prescribed. It is important you do not combine these meds as it can be dangerous. You will take Cymbalta 1 capsule daily for 1 weeks then increase to 2 capsules daily. Please understand this takes 6-8 weeks to work so give it time. 3. Return in 1 month for recheck.  Please call the clinic at [phone] if your symptoms worsen or you have any concerns. It was my pleasure to see you. -- Durward Parcelavid McMullen, DO [facility], PGY-1  Duloxetine delayed-release capsules What is this medicine? DULOXETINE (doo LOX e teen) is used to treat depression, anxiety, and different types of chronic pain. This medicine may be used for other purposes; ask your health care provider or pharmacist if you have questions. COMMON BRAND NAME(S): Cymbalta, Irenka What should I tell my health care provider before I take this medicine? They need to know if you have any of these conditions: -bipolar disorder or a family history of bipolar disorder -glaucoma -kidney disease -liver disease -suicidal thoughts or a previous suicide attempt -taken medicines called MAOIs like Carbex, Eldepryl, Marplan, Nardil, and Parnate within 14 days -an unusual reaction to duloxetine, other medicines, foods, dyes, or preservatives -pregnant or trying to get pregnant -breast-feeding How should I use this medicine? Take this medicine by mouth with a glass of water. Follow the directions on the prescription label. Do not cut, crush or chew this medicine. You can take this medicine with or without food. Take your medicine at regular intervals. Do not take your medicine more often than directed. Do not stop taking this medicine suddenly except  upon the advice of your doctor. Stopping this medicine too quickly may cause serious side effects or your condition may worsen. A special MedGuide will be given to you by the pharmacist with each prescription and refill. Be sure to read this information carefully each time. Talk to your pediatrician regarding the use of this medicine in children. While this drug may be prescribed for children as young as 367 years of age for selected conditions, precautions do apply. Overdosage: If you think you have taken too much of this medicine contact a poison control center or emergency room at once. NOTE: This medicine is only for you. Do not share this medicine with others. What if I miss a dose? If you miss a dose, take it as soon as you can. If it is almost time for your next dose, take only that dose. Do not take double or extra doses. What may interact with this medicine? Do not take this medicine with any of the following medications: -desvenlafaxine -levomilnacipran -linezolid -MAOIs like Carbex, Eldepryl, Marplan, Nardil, and Parnate -methylene blue (injected into a vein) -milnacipran -thioridazine -venlafaxine This medicine may also interact with the following medications: -alcohol -amphetamines -aspirin and aspirin-like medicines -certain antibiotics like ciprofloxacin and enoxacin -certain medicines for blood pressure, heart disease, irregular heart beat -certain medicines for depression, anxiety, or psychotic disturbances -certain medicines for migraine headache like almotriptan, eletriptan, frovatriptan, naratriptan, rizatriptan, sumatriptan, zolmitriptan -certain medicines that treat or prevent blood clots like warfarin, enoxaparin, and dalteparin -cimetidine -fentanyl -lithium -NSAIDS, medicines for pain and inflammation, like ibuprofen or naproxen -phentermine -procarbazine -rasagiline -sibutramine -  St. John's wort -theophylline -tramadol -tryptophan This list may not  describe all possible interactions. Give your health care provider a list of all the medicines, herbs, non-prescription drugs, or dietary supplements you use. Also tell them if you smoke, drink alcohol, or use illegal drugs. Some items may interact with your medicine. What should I watch for while using this medicine? Tell your doctor if your symptoms do not get better or if they get worse. Visit your doctor or health care professional for regular checks on your progress. Because it may take several weeks to see the full effects of this medicine, it is important to continue your treatment as prescribed by your doctor. Patients and their families should watch out for new or worsening thoughts of suicide or depression. Also watch out for sudden changes in feelings such as feeling anxious, agitated, panicky, irritable, hostile, aggressive, impulsive, severely restless, overly excited and hyperactive, or not being able to sleep. If this happens, especially at the beginning of treatment or after a change in dose, call your health care professional. Bonita Quin may get drowsy or dizzy. Do not drive, use machinery, or do anything that needs mental alertness until you know how this medicine affects you. Do not stand or sit up quickly, especially if you are an older patient. This reduces the risk of dizzy or fainting spells. Alcohol may interfere with the effect of this medicine. Avoid alcoholic drinks. This medicine can cause an increase in blood pressure. This medicine can also cause a sudden drop in your blood pressure, which may make you feel faint and increase the chance of a fall. These effects are most common when you first start the medicine or when the dose is increased, or during use of other medicines that can cause a sudden drop in blood pressure. Check with your doctor for instructions on monitoring your blood pressure while taking this medicine. Your mouth may get dry. Chewing sugarless gum or sucking hard candy,  and drinking plenty of water may help. Contact your doctor if the problem does not go away or is severe. What side effects may I notice from receiving this medicine? Side effects that you should report to your doctor or health care professional as soon as possible: -allergic reactions like skin rash, itching or hives, swelling of the face, lips, or tongue -anxious -breathing problems -confusion -changes in vision -chest pain -confusion -elevated mood, decreased need for sleep, racing thoughts, impulsive behavior -eye pain -fast, irregular heartbeat -feeling faint or lightheaded, falls -feeling agitated, angry, or irritable -hallucination, loss of contact with reality -high blood pressure -loss of balance or coordination -palpitations -redness, blistering, peeling or loosening of the skin, including inside the mouth -restlessness, pacing, inability to keep still -seizures -stiff muscles -suicidal thoughts or other mood changes -trouble passing urine or change in the amount of urine -trouble sleeping -unusual bleeding or bruising -unusually weak or tired -vomiting -yellowing of the eyes or skin Side effects that usually do not require medical attention (report to your doctor or health care professional if they continue or are bothersome): -change in sex drive or performance -change in appetite or weight -constipation -dizziness -dry mouth -headache -increased sweating -nausea -tired This list may not describe all possible side effects. Call your doctor for medical advice about side effects. You may report side effects to FDA at 1-800-FDA-1088. Where should I keep my medicine? Keep out of the reach of children. Store at room temperature between 20 and 25 degrees C (68 to 77 degrees  F). Throw away any unused medicine after the expiration date. NOTE: This sheet is a summary. It may not cover all possible information. If you have questions about this medicine, talk to your  doctor, pharmacist, or health care provider.  2017 Elsevier/Gold Standard (2015-11-16 18:16:03)

## 2016-05-27 NOTE — Assessment & Plan Note (Addendum)
Chronic. Uncontrolled. Spares left leg. Seems to have radicular symptoms. No red flags concerning cauda equina syndrome. CT on 08/2015 showed mild disc bulge with degenerative facet hypertrophy causing mod b/l neural foramen stenoses with possible associated nerve root impingement. Has failed NSAIDs, tramadol, gabapentin. --Gabapentin d/c, switched to Cymbalta 30 mg QD for 1 weeks then 60 mg QD --Pt educated on need for 6-8 weeks for full effect --Could continue using tylenol PRN for pain --If fails, could try higher dose gabapentin up to 1500 mg QD given pt only titrated up to 600 mg QD --Will obtain MRI lumbar spine to better visualize nerve impingement and if pt would benefit from surgery --Pt to go to PT on 11/28 --Would benefit from weight loss

## 2016-05-27 NOTE — Assessment & Plan Note (Deleted)
A 

## 2016-05-28 ENCOUNTER — Ambulatory Visit: Payer: Medicaid Other | Admitting: Physical Therapy

## 2016-05-28 NOTE — Assessment & Plan Note (Signed)
>>  ASSESSMENT AND PLAN FOR PRIMARY HYPERTENSION WRITTEN ON 05/28/2016 10:18 AM BY MCMULLEN, DAVID J, DO  Chronic. Uncontrolled. Takes thiazide, but occasionally non-compliant. Obvious uncontrolled pain on exam could contribute to elevations in BP. BP 154/100 in office.  --Continue HCTZ 12.5 mg QD --Will work towards better pain control as this may be contributing to high BP --Educated on reducing sodium intake --If continues to be uncontrolled, may need to increase HCTZ or add additional agent

## 2016-05-28 NOTE — Assessment & Plan Note (Signed)
Chronic. Uncontrolled. Takes thiazide, but occasionally non-compliant. Obvious uncontrolled pain on exam could contribute to elevations in BP. BP 154/100 in office.  --Continue HCTZ 12.5 mg QD --Will work towards better pain control as this may be contributing to high BP --Educated on reducing sodium intake --If continues to be uncontrolled, may need to increase HCTZ or add additional agent

## 2016-06-04 ENCOUNTER — Ambulatory Visit (HOSPITAL_COMMUNITY): Admission: RE | Admit: 2016-06-04 | Payer: Medicaid Other | Source: Ambulatory Visit

## 2016-06-05 ENCOUNTER — Encounter: Payer: Self-pay | Admitting: Physical Therapy

## 2016-06-05 ENCOUNTER — Ambulatory Visit: Payer: Medicaid Other | Attending: Family Medicine | Admitting: Physical Therapy

## 2016-06-05 DIAGNOSIS — R262 Difficulty in walking, not elsewhere classified: Secondary | ICD-10-CM | POA: Diagnosis present

## 2016-06-05 DIAGNOSIS — M5441 Lumbago with sciatica, right side: Secondary | ICD-10-CM | POA: Diagnosis not present

## 2016-06-05 DIAGNOSIS — M79605 Pain in left leg: Secondary | ICD-10-CM

## 2016-06-05 DIAGNOSIS — G8929 Other chronic pain: Secondary | ICD-10-CM | POA: Diagnosis present

## 2016-06-05 DIAGNOSIS — M79604 Pain in right leg: Secondary | ICD-10-CM | POA: Diagnosis present

## 2016-06-05 NOTE — Therapy (Signed)
[facility] [address] [place], [place], [address] Phone: [phone]   Fax:  [phone]  Physical Therapy Evaluation  Patient Details  Name: Shirley [name] MRN: [id number] Date of Birth: [birth year] Referring Provider: Dr. Caroleen Hamman  Encounter Date: 06/05/2016      PT End of Session - 06/05/16 1023    Visit Number 1   PT Start Time 0945      Past Medical History:  Diagnosis Date  . Arthritis   . Hypertension   . No significant past medical history   . Obesity     Past Surgical History:  Procedure Laterality Date  . TOOTH EXTRACTION N/A 10/22/2013   Procedure: DENTAL EXTRACTIONS TEETH #1, 16, 17, 32;  Surgeon: Georgia Lopes, DDS;  Location: [facility] OR;  Service: Oral Surgery;  Laterality: N/A;    There were no vitals filed for this visit.       Subjective Assessment - 06/05/16 0952    Subjective Pt states that she has bilateral knee pain for the past 3 years. She states that the whole right leg hurts and explains that the right is worse than the left. The left leg only hurts at the knee. She can not remeber a specific event that caused the pain 3 years ago.  Pt states that she was diagnosed with sciatic nerve pain in April 2017.   Limitations Sitting;Walking;Standing   How long can you sit comfortably? 10-15 minutes   How long can you stand comfortably?  states "leg locks up"   How long can you walk comfortably? constant pain   Diagnostic tests MRI supposed to be taken next week   Patient Stated Goals increase strength, flexibility, decrease pain   Currently in Pain? Yes   Pain Score 7    Pain Location Leg   Pain Orientation Right;Left   Pain Descriptors / Indicators Sharp;Aching;Constant   Pain Type Chronic pain   Pain Radiating Towards yes   Pain Onset Other (comment)  3 years ago   Aggravating Factors  Any movement, constant pain   Pain Relieving Factors rubbing leg, working out   Effect of Pain on  Daily Activities Unable to do daily activities without pain   Multiple Pain Sites Yes   Pain Score 8   Pain Location Back   Pain Orientation Lower   Pain Descriptors / Indicators Aching   Pain Type Chronic pain            [facility] PT Assessment - 06/05/16 0001      Assessment   Medical Diagnosis Bilateral leg pain   Referring Provider Dr. Caroleen Hamman   Next MD Visit next week   Prior Therapy no     Balance Screen   Has the patient fallen in the past 6 months Yes   How many times? 1   Has the patient had a decrease in activity level because of a fear of falling?  Yes   Is the patient reluctant to leave their home because of a fear of falling?  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   Right Hip ADduction 4-/5   Left Hip Flexion 5/5   Left Hip ABduction 5/5   Left Hip ADduction 5/5   Right/Left Knee Right;Left   Right Knee Flexion 4+/5   Right Knee Extension 4/5   Left Knee Flexion 5/5   Left Knee Extension 5/5     Flexibility   Soft Tissue Assessment /Muscle Length yes   Hamstrings lacking 35 degrees from neutral   ITB tight, P! in ant. hip   Piriformis tight, P! in ant hip     Palpation   Patella mobility P! on laterl side of right patella     Special Tests    Special Tests Lumbar   Lumbar Tests Slump Test     Slump test   Findings  Positive   Side Right     Transfers   Five time sit to stand comments  27 seconds   Comments knee crepitus in bilateral knee joints                   [facility] Adult PT Treatment/Exercise - 06/05/16 0001      Self-Care   Self-Care Heat/Ice Application     Exercises   Exercises Knee/Hip;Lumbar     Knee/Hip Exercises: Stretches   Other Knee/Hip Stretches Sciatic nerve flossing     Knee/Hip Exercises: Aerobic   Nustep NuStep 5 minutes, lvl 3  P!     Knee/Hip Exercises: Seated   Long Arc Quad Strengthening;AROM;Right;Left;10 reps   Harley-DavidsonBall Squeeze Pillow squeeze, x10 hold 5 secs   Clamshell with TheraBand Red  x10   Marching Strengthening;AROM;Both;10 reps     Knee/Hip Exercises: Supine   Straight Leg Raises Right;Left;Strengthening;10 reps     Modalities   Modalities Moist Heat     Moist Heat Therapy   Number Minutes Moist Heat 15 Minutes   Moist Heat Location Hip;Knee  right                PT Education - 06/05/16 1023    Education provided Yes   Education Details Extensive HEP for LE strengthening and stretching   Person(s) Educated Patient   Methods Explanation;Handout;Demonstration   Comprehension Verbalized understanding;Returned demonstration             PT Long Term Goals - 06/05/16 1107      PT LONG TERM GOAL #1   Title Pt will demostrate proper recall with HEP.   Time 8   Period Weeks   Status New               Plan - 06/05/16 1024    Clinical Impression Statement Pt presents with bilateral leg pain which is greatest in the right leg. She has positrive slump test findings on the right. Hip and knee strength on the right is significantly weaker than the left and the majority of her symptoms are on the right. Pt reports that she normally wears a knee brace and compression around the right knee which helps with stability.  She has abnormal gait and difficulty with any weight bearing exercise. Pt knee positon is in the valgus  position. PROM and stretching was limited due  to anterior hip pain and pain on the medial aspect of the knee. Pt performed 5x sit to stand in 27 seconds and slowed down extensively on reps 4 and 5 due to pain. Crepitus was heard in bilateral knees throuhgout activity. Pt was given extensive HEP and educated on using nonweight bearing aerobic machines in order to take pressure off knees.   Rehab Potential Fair   PT Frequency One time visit   PT Treatment/Interventions Moist Heat;Electrical Stimulation   PT Home Exercise Plan Extensive LE strrengthening and stretching HEP   Recommended Other Services told pt about monthly rehab gym membership   Consulted and Agree with Plan of Care Patient      Patient will benefit from skilled therapeutic intervention in order to improve the following deficits and impairments:  Abnormal gait, Decreased activity tolerance, Decreased balance, Pain, Obesity, Improper body mechanics, Postural dysfunction, Impaired flexibility, Difficulty walking, Decreased strength, Decreased range of motion, Decreased endurance, Decreased mobility  Visit Diagnosis: Chronic bilateral low back pain with right-sided sciatica  Pain in right leg  Pain in left leg  Difficulty in walking, not elsewhere classified     Problem List Patient Active Problem List   Diagnosis Date Noted  . Osteoarthritis of both knees 09/29/2015  . 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  . Low back pain with right-sided sciatica 09/15/2015  . 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 Submandibular abscess 10/18/2013  . OBESITY, MORBID 12/02/2006  . Recurrent boils 10/21/2006  . Knee pain, chronic 09/04/2006  . HTN (hypertension) 08/28/2006  . ACNE 08/28/2006    Tamsen MeekBrian Cade Raegan Sipp, SPT 06/05/2016, 11:24 AM  [facility] [address] [place], [place], [address] Phone: [phone]   Fax:  [phone]  Name: Arlyss RepressSacoya M  Cara MRN: [id number] Date of Birth: 03/06/1991

## 2016-06-07 ENCOUNTER — Ambulatory Visit (INDEPENDENT_AMBULATORY_CARE_PROVIDER_SITE_OTHER): Payer: Medicaid Other | Admitting: Internal Medicine

## 2016-06-07 ENCOUNTER — Encounter: Payer: Self-pay | Admitting: Internal Medicine

## 2016-06-07 DIAGNOSIS — G8929 Other chronic pain: Secondary | ICD-10-CM

## 2016-06-07 DIAGNOSIS — M5441 Lumbago with sciatica, right side: Secondary | ICD-10-CM | POA: Diagnosis not present

## 2016-06-07 NOTE — Assessment & Plan Note (Signed)
Patient missed MRI appointment and presents asking for pain medication. Discussed with patient that Dr. Tamala FothergillMcMullen's workup and treatment plan have been thorough and very reasonable, and that without MRI we cannot provide her with any new information or options. Reminded her that Dr. Abelardo DieselMcMullen had discussed with her that Cymbalta will not work right away, and she has only been on the medication for a week. Offered to resume gabapentin at high dose, however patient declined. Patient still has Tramadol. No red flags today. Pain consistent with patient's prior evaluations.  - Provided patient with number to reschedule MRI - Continue Cymbalta - Continue Tramadol and Tylenol PRN - F/u with Dr. Abelardo DieselMcMullen in about two weeks as previously scheduled

## 2016-06-07 NOTE — Patient Instructions (Signed)
It was nice meeting you today Shirley [name].  To reschedule your MRI, please call [phone].   In the meantime, you can continue to take Tramadol and Tylenol as needed for pain. It may also help to either ice or apply heat to your back, depending on which makes your pain feel better.   Please keep your follow up appointment with Dr. Abelardo DieselMcMullen as planned.   If you have any questions or concerns, please feel free to call the clinic.   Be well,  Dr. Natale MilchLancaster

## 2016-06-07 NOTE — Progress Notes (Signed)
   Subjective:   Patient: Shirley [name]       Birthdate: May 14, 1991       MRN: [id number]      HPI  Shirley RepressSacoya M Schecter is a 25 y.o. female presenting for same day visit for back pain.   Patient was seen on 11/28 for Dr. Abelardo DieselMcMullen for low back pain with sciatica. Gabapentin was discontinued at that time and patient was started on Cymbalta. MRI was also scheduled to visualize possible nerve impingement and evaluate possible surgical procedures. Patient says she forgot about her MRI appointment, so she is back today to discuss her pain.   Patient has been taking Cymbalta for the past week as well as Tramadol. She went to physical therapy two days ago as well. She reports slight worsening in pain yesterday. Denies numbness, weakness, saddle anesthesia, bowel or bladder incontinence.   Smoking status reviewed.   Review of Systems See HPI.     Objective:  Physical Exam  Constitutional: She is oriented to person, place, and time.  Overweight female in NAD  HENT:  Head: Normocephalic and atraumatic.  Eyes: Conjunctivae and EOM are normal. Right eye exhibits no discharge. Left eye exhibits no discharge.  Pulmonary/Chest: Effort normal. No respiratory distress.  Musculoskeletal:  No gait abnormalities. Able to walk, sit, and stand unassisted. Mild TTP of R lumbar region.   Neurological: She is alert and oriented to person, place, and time.  Psychiatric:  Flat affect      Assessment & Plan:  Low back pain with right-sided sciatica Patient missed MRI appointment and presents asking for pain medication. Discussed with patient that Dr. Tamala FothergillMcMullen's workup and treatment plan have been thorough and very reasonable, and that without MRI we cannot provide her with any new information or options. Reminded her that Dr. Abelardo DieselMcMullen had discussed with her that Cymbalta will not work right away, and she has only been on the medication for a week. Offered to resume gabapentin at high dose, however patient  declined. Patient still has Tramadol. No red flags today. Pain consistent with patient's prior evaluations.  - Provided patient with number to reschedule MRI - Continue Cymbalta - Continue Tramadol and Tylenol PRN - F/u with Dr. Abelardo DieselMcMullen in about two weeks as previously scheduled   Tarri AbernethyAbigail J Chanan Detwiler, MD, MPH PGY-2 [facility] Pager [phone]

## 2016-06-11 ENCOUNTER — Emergency Department (HOSPITAL_COMMUNITY)
Admission: EM | Admit: 2016-06-11 | Discharge: 2016-06-12 | Disposition: A | Discharge status: Home / Self Care | Payer: Medicaid Other | Attending: Emergency Medicine | Admitting: Emergency Medicine

## 2016-06-11 ENCOUNTER — Emergency Department (HOSPITAL_COMMUNITY): Payer: Medicaid Other

## 2016-06-11 ENCOUNTER — Other Ambulatory Visit: Payer: Self-pay

## 2016-06-11 ENCOUNTER — Encounter (HOSPITAL_COMMUNITY): Payer: Self-pay | Admitting: Emergency Medicine

## 2016-06-11 ENCOUNTER — Ambulatory Visit (HOSPITAL_COMMUNITY)
Admission: RE | Admit: 2016-06-11 | Discharge: 2016-06-11 | Disposition: A | Discharge status: Home / Self Care | Payer: Medicaid Other | Source: Ambulatory Visit | Attending: Family Medicine | Admitting: Family Medicine

## 2016-06-11 DIAGNOSIS — M47897 Other spondylosis, lumbosacral region: Secondary | ICD-10-CM | POA: Diagnosis not present

## 2016-06-11 DIAGNOSIS — M5134 Other intervertebral disc degeneration, thoracic region: Secondary | ICD-10-CM | POA: Diagnosis not present

## 2016-06-11 DIAGNOSIS — R079 Chest pain, unspecified: Secondary | ICD-10-CM | POA: Diagnosis not present

## 2016-06-11 DIAGNOSIS — M47896 Other spondylosis, lumbar region: Secondary | ICD-10-CM | POA: Diagnosis not present

## 2016-06-11 DIAGNOSIS — M5127 Other intervertebral disc displacement, lumbosacral region: Secondary | ICD-10-CM | POA: Diagnosis not present

## 2016-06-11 DIAGNOSIS — Z87891 Personal history of nicotine dependence: Secondary | ICD-10-CM | POA: Insufficient documentation

## 2016-06-11 DIAGNOSIS — K8020 Calculus of gallbladder without cholecystitis without obstruction: Secondary | ICD-10-CM | POA: Insufficient documentation

## 2016-06-11 DIAGNOSIS — M4807 Spinal stenosis, lumbosacral region: Secondary | ICD-10-CM | POA: Diagnosis not present

## 2016-06-11 DIAGNOSIS — G8929 Other chronic pain: Secondary | ICD-10-CM | POA: Insufficient documentation

## 2016-06-11 DIAGNOSIS — M5441 Lumbago with sciatica, right side: Secondary | ICD-10-CM | POA: Diagnosis not present

## 2016-06-11 DIAGNOSIS — M8938 Hypertrophy of bone, other site: Secondary | ICD-10-CM | POA: Insufficient documentation

## 2016-06-11 DIAGNOSIS — I10 Essential (primary) hypertension: Secondary | ICD-10-CM | POA: Diagnosis not present

## 2016-06-11 LAB — CBC
HCT: 37.2 % (ref 36.0–46.0)
Hemoglobin: 11.9 g/dL — ABNORMAL LOW (ref 12.0–15.0)
MCH: 26.4 pg (ref 26.0–34.0)
MCHC: 32 g/dL (ref 30.0–36.0)
MCV: 82.7 fL (ref 78.0–100.0)
Platelets: 259 10*3/uL (ref 150–400)
RBC: 4.5 MIL/uL (ref 3.87–5.11)
RDW: 14.7 % (ref 11.5–15.5)
WBC: 11.1 10*3/uL — AB (ref 4.0–10.5)

## 2016-06-11 LAB — BASIC METABOLIC PANEL
Anion gap: 5 (ref 5–15)
BUN: 10 mg/dL (ref 6–20)
CALCIUM: 8.8 mg/dL — AB (ref 8.9–10.3)
CHLORIDE: 103 mmol/L (ref 101–111)
CO2: 28 mmol/L (ref 22–32)
CREATININE: 0.98 mg/dL (ref 0.44–1.00)
GFR calc Af Amer: >60 mL/min (ref >60)
GFR calc non Af Amer: >60 mL/min (ref >60)
Glucose, Bld: 86 mg/dL (ref 65–99)
Potassium: 3.6 mmol/L (ref 3.5–5.1)
SODIUM: 136 mmol/L (ref 135–145)

## 2016-06-11 LAB — I-STAT TROPONIN, ED: Troponin i, poc: 0 ng/mL (ref 0.00–0.08)

## 2016-06-11 NOTE — ED Provider Notes (Signed)
[facility]-EMERGENCY DEPT Provider Note   CSN: [id number] Arrival date & time: 06/11/16  2049  By signing my name below, I, Rosario AdieWilliam Andrew Hiatt, attest that this documentation has been prepared under the direction and in the presence of Arby BarretteMarcy Lewanna Petrak, MD. Electronically Signed: Rosario AdieWilliam Andrew Hiatt, ED Scribe. 06/11/16. 11:44 PM.  History   Chief Complaint Chief Complaint  Patient presents with  . Chest Pain   The history is provided by the patient. No language interpreter was used.    HPI Comments: Shirley [name] is a 25 y.o. female with a PMHx of HTN and obesity, who presents to the Emergency Department complaining of intermittent, gradually improving left-sided, upper chest pain onset tonight prior to coming into the ED. Pt reports that tonight she was playing with her 6yo daughter, and during this her daughter suddenly jumped on her backside. After her daughter got off of her back, she states that she immediately became light-headed and a diffuse headache came on. At that time she opted to lay down and take several deep breaths, which she states relieved her light-headedness and her headache completely. However, her chest pain came on shortly after, and lasted for approximately two minutes. Pt states that her chest pain is now only present with deep inspirations, but it has been improving since coming into the ED. She describes this pain as sharp, and reports that it is also radiating into her left-sided, upper back as well. She notes associated mild shortness of breath secondary to her chest pain. Pt further notes that she has a h/o similar symptoms of light-headedness and headaches over the past month related to recently being placed on a new HTN medication and Cymbalta. Pt has been compliant with her daily HTN medication prior to her episode of pain today. Pt additionally reports that she has been having intermittent, bilateral lower leg swelling over the past several months, but attributes  this to her h/o arthritis. No Hx/FHx of PE/DVT, recent long travel, surgery, fracture, prolonged immobilization, or hormone use. Pt is a non-smoker. No FHX of cardiac related issues or sudden cardiac death. She denies fever, chills, visual changes, or any other associated symptoms.   Past Medical History:  Diagnosis Date  . Arthritis   . Hypertension   . No significant past medical history   . Obesity    Patient Active Problem List   Diagnosis Date Noted  . Osteoarthritis of both knees 09/29/2015  . 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  . Low back pain with right-sided sciatica 09/15/2015  . 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 Submandibular abscess 10/18/2013  . OBESITY, MORBID 12/02/2006  . Recurrent boils 10/21/2006  . Knee pain, chronic 09/04/2006  . HTN (hypertension) 08/28/2006  . ACNE 08/28/2006   Past Surgical History:  Procedure Laterality Date  . TOOTH EXTRACTION N/A 10/22/2013   Procedure: DENTAL EXTRACTIONS TEETH #1, 16, 17, 32;  Surgeon: Georgia LopesScott M Jensen, DDS;  Location: [facility] OR;  Service: Oral Surgery;  Laterality: N/A;   OB History    Gravida Para Term Preterm AB Living   3       1 2    SAB TAB Ectopic Multiple Live Births           2     Home Medications    Prior to Admission medications   Medication Sig Start Date End Date Taking? Authorizing Provider  DULoxetine (CYMBALTA) 30 MG capsule Take 1 capsule (30 mg total) by mouth daily. Take 1 capsule daily for  1 week, then increase to 2 capsules daily. 05/27/16  Yes Wendee Beavers, DO  hydrochlorothiazide (MICROZIDE) 12.5 MG capsule Take 1 capsule (12.5 mg total) by mouth daily. 02/20/16  Yes [place] N Rum[place], DO  naproxen sodium (ANAPROX) 220 MG tablet Take 220 mg by mouth 2 (two) times daily with a meal.   Yes Historical Provider, MD   Family History Family History  Problem Relation Age of Onset  . Hypertension Mother   . Diabetes Maternal Grandmother   . Diabetes Paternal Grandmother     . Stroke Paternal Grandmother    Social History Social History  Substance Use Topics  . Smoking status: Former Smoker    Packs/day: 0.00    Quit date: 09/15/2015  . Smokeless tobacco: Never Used  . Alcohol use 0.0 oz/week     Comment: rare   Allergies   Patient has no known allergies.  Review of Systems Review of Systems  10 Systems reviewed and all are negative for acute change except as noted in the HPI.  Physical Exam Updated Vital Signs BP 146/99   Pulse 63   Temp 98.5 F (36.9 C) (Oral)   Resp 13   Ht 5\' 7"  (1.702 m)   Wt (!) 310 lb 1 oz (140.6 kg)   LMP 05/24/2016   SpO2 97%   BMI 48.56 kg/m   Physical Exam  Constitutional: She appears well-developed and well-nourished. No distress.  HENT:  Head: Normocephalic and atraumatic.  Eyes: Conjunctivae are normal.  Neck: Normal range of motion.  Cardiovascular: Normal rate, regular rhythm, normal heart sounds and intact distal pulses.  Exam reveals no gallop and no friction rub.   No murmur heard. Pulmonary/Chest: Effort normal and breath sounds normal. No respiratory distress. She has no wheezes. She has no rales. She exhibits no tenderness.  Abdominal: Soft. She exhibits no distension. There is no tenderness.  Musculoskeletal: Normal range of motion. She exhibits no edema or tenderness.  Neurological: She is alert.  Skin: No pallor.  Psychiatric: She has a normal mood and affect. Her behavior is normal.  Nursing note and vitals reviewed.  ED Treatments / Results  DIAGNOSTIC STUDIES: Oxygen Saturation is 97% on RA, normal by my interpretation.   COORDINATION OF CARE: 11:36 PM-Discussed next steps with pt. Pt verbalized understanding and is agreeable with the plan.   Labs (all labs ordered are listed, but only abnormal results are displayed) Labs Reviewed  BASIC METABOLIC PANEL - Abnormal; Notable for the following:       Result Value   Calcium 8.8 (*)    All other components within normal limits  CBC  - Abnormal; Notable for the following:    WBC 11.1 (*)    Hemoglobin 11.9 (*)    All other components within normal limits  D-DIMER, QUANTITATIVE (NOT AT [facility])  TROPONIN I  Rosezena Sensor, ED   EKG  EKG Interpretation None      Radiology Dg Chest 2 View  Result Date: 06/11/2016 CLINICAL DATA:  Chest pain. EXAM: CHEST  2 VIEW COMPARISON:  Radiographs of December 17, 2007. FINDINGS: The heart size and mediastinal contours are within normal limits. Both lungs are clear. No pneumothorax or pleural effusion is noted. The visualized skeletal structures are unremarkable. IMPRESSION: No active cardiopulmonary disease. Electronically Signed   By: Lupita Raider, M.D.   On: 06/11/2016 21:22   Mr Lumbar Spine Wo Contrast  Result Date: 06/11/2016 CLINICAL DATA:  Initial evaluation for chronic low back pain radiating  down right leg. EXAM: MRI LUMBAR SPINE WITHOUT CONTRAST TECHNIQUE: Multiplanar, multisequence MR imaging of the lumbar spine was performed. No intravenous contrast was administered. COMPARISON:  None available. FINDINGS: Segmentation: Normal segmentation. Lowest well-formed disc is labeled the L5-S1 level. Alignment: Vertebral bodies normally aligned with preservation of the normal lumbar lordosis. No listhesis. I Vertebrae: Vertebral body heights are well preserved. Signal intensity within the vertebral body bone marrow within normal limits. No focal osseous lesions. Conus medullaris: Extends to the L2 level and appears normal. Paraspinal and other soft tissues: Paraspinous soft tissues within normal limits. Cholelithiasis noted. Remainder the visualized vessel structures within normal limits. Disc levels: T11-12: Diffuse degenerative disc bulge with disc desiccation and intervertebral disc space narrowing. Facet hypertrophy. Central canal remains patent. No significant foraminal narrowing. T12-L1:  Negative. L1-2:  Negative. L2-3:  Negative. L3-4:  Negative. L4-5: No disc bulge or disc  protrusion. Mild bilateral facet arthrosis. No significant canal stenosis. Mild bilateral foraminal narrowing related to facet disease. L5-S1: Small central disc protrusion with slight caudad angulation. Protruding disc closely approximates the transiting S1 nerve roots, slightly greater on the right. Mild bilateral facet arthrosis. No significant canal narrowing. Moderate bilateral foraminal stenosis due to disc bulge and facet disease, right worse than left. IMPRESSION: 1. Small central disc protrusion at L5-S1 without significant stenosis or frank neural impingement. 2. Moderate bilateral foraminal narrowing at L5-S1 related to disc bulge and facet disease, right worse than left. 3. Degenerative disc bulge and facet hypertrophy at T11-12 without significant stenosis. 4. Mild bilateral facet arthrosis at L4-5 and L5-S1. 5. Cholelithiasis. Electronically Signed   By: Rise MuBenjamin  McClintock M.D.   On: 06/11/2016 18:04    Procedures Procedures   Medications Ordered in ED Medications - No data to display  Initial Impression / Assessment and Plan / ED Course  I have reviewed the triage vital signs and the nursing notes.  Pertinent labs & imaging results that were available during my care of the patient were reviewed by me and considered in my medical decision making (see chart for details).  Clinical Course     Final Clinical Impressions(s) / ED Diagnoses   Final diagnoses:  Nonspecific chest pain  Patient presents with first headache and lightheadedness followed by a brief period of sharp chest pain. Patient does have risk factors of hypertension and obesity. Patient is low probability for pulmonary embolus. Perc negative with no immediate risk factors. Cardiac enzymes negative 2. No baseline change in EKG. Patient does have baseline abnormal EKG. Patient is counseled to follow-up with cardiology for reassessment of poorly controlled hypertension and intermittent chest pain. Patient is well in  appearance with stable vital signs. New Prescriptions New Prescriptions   No medications on file      Arby BarretteMarcy Silverio Hagan, MD 06/12/16 [phone]

## 2016-06-11 NOTE — ED Triage Notes (Signed)
Patient reports left upper chest pain radiating to left upper back with mild SOB onset evening , pain increases with deep inspiration , no fever or chills .

## 2016-06-12 LAB — D-DIMER, QUANTITATIVE: D-Dimer, Quant: 0.33 ug/mL-FEU (ref 0.00–0.50)

## 2016-06-12 LAB — TROPONIN I: Troponin I: <0.03 ng/mL (ref <0.03)

## 2016-06-13 ENCOUNTER — Telehealth: Payer: Self-pay | Admitting: Family Medicine

## 2016-06-13 NOTE — Telephone Encounter (Signed)
Called patient about MRI lumbar spine results. Patient continues to have pain despite Cymbalta use. Explained patient needs to wait 6-8 weeks before medication works. Told patient to take tylenol and NSAIDs PRN for additional relief. Patient says does not work and "would like something stronger or referral to pain clinic." No f/u scheduled. [facility]-3 [facility]. -- Durward Parcelavid McMullen, DO [facility], PGY-1

## 2016-06-20 NOTE — Progress Notes (Deleted)
   [facility] Phone: [phone]   Date of Visit: 06/21/2016   HPI:  T11-12: Diffuse degenerative disc bulge with disc desiccation and intervertebral disc space narrowing. Facet hypertrophy. Central canal remains patent. No significant foraminal narrowing. L4-5: No disc bulge or disc protrusion. Mild bilateral facet arthrosis. No significant canal stenosis. Mild bilateral foraminal narrowing related to facet disease. L5-S1: Small central disc protrusion with slight caudad angulation. Protruding disc closely approximates the transiting S1 nerve roots, slightly greater on the right. Mild bilateral facet arthrosis. No significant canal narrowing. Moderate bilateral foraminal stenosis due to disc bulge and facet disease, right worse than left.  ROS: See HPI.  PMFSH:  PMH: HTN Sciatica Neuralgia  ACNE OA of Bilateral Knees Spinal Stenosis of Lumbar Region  Morbid Obesity   PHYSICAL EXAM: LMP 05/24/2016  Gen: *** HEENT: *** Heart: *** Lungs: *** Neuro: *** Ext: ***  ASSESSMENT/PLAN:  Health maintenance:  -***  No problem-specific Assessment & Plan notes found for this encounter.  FOLLOW UP: Follow up in *** for ***  Palma HolterKanishka G Gunadasa, MD PGY 2 [facility]

## 2016-06-21 ENCOUNTER — Ambulatory Visit: Payer: Medicaid Other | Admitting: Internal Medicine

## 2016-06-28 ENCOUNTER — Encounter (HOSPITAL_COMMUNITY): Payer: Self-pay

## 2016-06-28 ENCOUNTER — Emergency Department (HOSPITAL_COMMUNITY)
Admission: EM | Admit: 2016-06-28 | Discharge: 2016-06-28 | Disposition: A | Discharge status: Home / Self Care | Payer: Medicaid Other | Attending: Emergency Medicine | Admitting: Emergency Medicine

## 2016-06-28 DIAGNOSIS — R1013 Epigastric pain: Secondary | ICD-10-CM

## 2016-06-28 DIAGNOSIS — J028 Acute pharyngitis due to other specified organisms: Secondary | ICD-10-CM | POA: Insufficient documentation

## 2016-06-28 DIAGNOSIS — B9689 Other specified bacterial agents as the cause of diseases classified elsewhere: Secondary | ICD-10-CM | POA: Insufficient documentation

## 2016-06-28 DIAGNOSIS — Z79899 Other long term (current) drug therapy: Secondary | ICD-10-CM | POA: Insufficient documentation

## 2016-06-28 DIAGNOSIS — I10 Essential (primary) hypertension: Secondary | ICD-10-CM | POA: Insufficient documentation

## 2016-06-28 DIAGNOSIS — Z87891 Personal history of nicotine dependence: Secondary | ICD-10-CM | POA: Diagnosis not present

## 2016-06-28 DIAGNOSIS — J029 Acute pharyngitis, unspecified: Secondary | ICD-10-CM | POA: Diagnosis present

## 2016-06-28 LAB — COMPREHENSIVE METABOLIC PANEL
ALT: 14 U/L (ref 14–54)
AST: 18 U/L (ref 15–41)
Albumin: 3.1 g/dL — ABNORMAL LOW (ref 3.5–5.0)
Alkaline Phosphatase: 105 U/L (ref 38–126)
Anion gap: 13 (ref 5–15)
BILIRUBIN TOTAL: 0.5 mg/dL (ref 0.3–1.2)
BUN: 5 mg/dL — AB (ref 6–20)
CHLORIDE: 99 mmol/L — AB (ref 101–111)
CO2: 22 mmol/L (ref 22–32)
CREATININE: 0.83 mg/dL (ref 0.44–1.00)
Calcium: 8.7 mg/dL — ABNORMAL LOW (ref 8.9–10.3)
Glucose, Bld: 89 mg/dL (ref 65–99)
POTASSIUM: 3.1 mmol/L — AB (ref 3.5–5.1)
Sodium: 134 mmol/L — ABNORMAL LOW (ref 135–145)
TOTAL PROTEIN: 8.6 g/dL — AB (ref 6.5–8.1)

## 2016-06-28 LAB — CBC
HCT: 38.7 % (ref 36.0–46.0)
Hemoglobin: 12.6 g/dL (ref 12.0–15.0)
MCH: 26.1 pg (ref 26.0–34.0)
MCHC: 32.6 g/dL (ref 30.0–36.0)
MCV: 80.1 fL (ref 78.0–100.0)
PLATELETS: 247 10*3/uL (ref 150–400)
RBC: 4.83 MIL/uL (ref 3.87–5.11)
RDW: 13.7 % (ref 11.5–15.5)
WBC: 11.7 10*3/uL — AB (ref 4.0–10.5)

## 2016-06-28 LAB — URINALYSIS, ROUTINE W REFLEX MICROSCOPIC
BILIRUBIN URINE: NEGATIVE
Bacteria, UA: NONE SEEN
GLUCOSE, UA: NEGATIVE mg/dL
KETONES UR: NEGATIVE mg/dL
NITRITE: NEGATIVE
PH: 5 (ref 5.0–8.0)
Protein, ur: NEGATIVE mg/dL
SPECIFIC GRAVITY, URINE: 1.021 (ref 1.005–1.030)

## 2016-06-28 LAB — LIPASE, BLOOD: LIPASE: 13 U/L (ref 11–51)

## 2016-06-28 LAB — PREGNANCY, URINE: PREG TEST UR: NEGATIVE

## 2016-06-28 LAB — RAPID STREP SCREEN (MED CTR MEBANE ONLY): Streptococcus, Group A Screen (Direct): NEGATIVE

## 2016-06-28 MED ORDER — DEXAMETHASONE SODIUM PHOSPHATE 10 MG/ML IJ SOLN
10.0000 mg | Freq: Once | INTRAMUSCULAR | Status: AC
  Administered 2016-06-28: 10 mg via INTRAMUSCULAR
  Filled 2016-06-28: qty 1

## 2016-06-28 MED ORDER — ONDANSETRON 4 MG PO TBDP
4.0000 mg | ORAL_TABLET | Freq: Once | ORAL | Status: AC
  Administered 2016-06-28: 4 mg via ORAL
  Filled 2016-06-28: qty 1

## 2016-06-28 MED ORDER — AMOXICILLIN-POT CLAVULANATE 875-125 MG PO TABS: 1.0000 | ORAL_TABLET | Freq: Two times a day (BID) | ORAL | 0 refills | Status: AC

## 2016-06-28 MED ORDER — AMOXICILLIN-POT CLAVULANATE 875-125 MG PO TABS
1.0000 | ORAL_TABLET | Freq: Once | ORAL | Status: AC
  Administered 2016-06-28: 1 via ORAL
  Filled 2016-06-28: qty 1

## 2016-06-28 NOTE — ED Triage Notes (Signed)
Pt reports sore throat, abdominal pain and 2 episodes of vomiting that began last night with chills. Pt also reports "Never pain in my legs but I can't hold my pee" Pt ambulatory. Redness noted to throat.

## 2016-06-28 NOTE — ED Triage Notes (Signed)
Called for triage. No answer x2

## 2016-06-28 NOTE — ED Provider Notes (Signed)
[facility]-EMER[facility]ENCY DEPT Provider Note   CSN: [id number] Arrival date & time: 06/28/16  1611     History   Chief Complaint Chief Complaint  Patient presents with  . Sore Throat  . Abdominal Pain  . Numbness    HPI Shirley [name] is a 25 y.o. female.  The history is provided by the patient.  Sore Throat  This is a new problem. The current episode started yesterday. The problem occurs constantly. The problem has been gradually worsening. Associated symptoms include chest pain and abdominal pain. Pertinent negatives include no shortness of breath. The symptoms are aggravated by swallowing. Nothing relieves the symptoms. She has tried nothing for the symptoms. The treatment provided no relief.  Abdominal Pain   This is a new problem. The current episode started 12 to 24 hours ago. Episode frequency: intermittent. The problem has not changed since onset.The pain is associated with an unknown factor. The pain is located in the epigastric region. The pain is mild. Associated symptoms include nausea. Pertinent negatives include fever, diarrhea, hematochezia, melena, vomiting and dysuria. The symptoms are aggravated by palpation. Nothing relieves the symptoms.   Patient reports that her daughter had similar symptoms of URI and sore throat of the past week.  Past Medical History:  Diagnosis Date  . Arthritis   . Hypertension   . No significant past medical history   . Obesity     Patient Active Problem List   Diagnosis Date Noted  . Osteoarthritis of both knees 09/29/2015  . 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  . Low back pain with right-sided sciatica 09/15/2015  . 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 Submandibular abscess 10/18/2013  . OBESITY, MORBID 12/02/2006  . Recurrent boils 10/21/2006  . Knee pain, chronic 09/04/2006  . HTN (hypertension) 08/28/2006  . 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 TOOTH EXTRACTION N/A  10/22/2013   Procedure: DENTAL EXTRACTIONS TEETH #1, 16, 17, 32;  Surgeon: Georgia Lopes, DDS;  Location: [facility] OR;  Service: Oral Surgery;  Laterality: N/A;    OB History    Gravida Para Term Preterm AB Living   3       1 2    SAB TAB Ectopic Multiple Live Births           2       Home Medications    Prior to Admission medications   Medication Sig Start Date End Date Taking? Authorizing Provider  amoxicillin-clavulanate (AUGMENTIN) 875-125 MG tablet Take 1 tablet by mouth every 12 (twelve) hours. 06/28/16 07/08/16  Nira Conn, MD  DULoxetine (CYMBALTA) 30 MG capsule Take 1 capsule (30 mg total) by mouth daily. Take 1 capsule daily for 1 week, then increase to 2 capsules daily. 05/27/16   Wendee Beavers, DO  hydrochlorothiazide (MICROZIDE) 12.5 MG capsule Take 1 capsule (12.5 mg total) by mouth daily. 02/20/16   Bassett N Rumley, DO  naproxen sodium (ANAPROX) 220 MG tablet Take 220 mg by mouth 2 (two) times daily with a meal.    Historical Provider, MD    Family History Family History  Problem Relation Age of Onset  . Hypertension Mother   . Diabetes Maternal Grandmother   . Diabetes Paternal Grandmother   . Stroke Paternal Grandmother     Social History Social History  Substance Use Topics  . Smoking status: Former Smoker    Packs/day: 0.00    Quit date: 09/15/2015  . Smokeless tobacco: Never  Used  . Alcohol use 0.0 oz/week     Comment: rare     Allergies   Patient has no known allergies.   Review of Systems Review of Systems  Constitutional: Negative for fever.  Respiratory: Negative for shortness of breath.   Cardiovascular: Positive for chest pain.  Gastrointestinal: Positive for abdominal pain and nausea. Negative for diarrhea, hematochezia, melena and vomiting.  Genitourinary: Negative for dysuria.  Ten systems are reviewed and are negative for acute change except as noted in the HPI    Physical Exam Updated Vital Signs BP (!) 144/105 (BP  Location: Right Arm)   Pulse 84   Temp 99.9 F (37.7 C) (Oral)   Resp 22   Ht 5\' 7"  (1.702 m)   Wt (!) 305 lb (138.3 kg)   LMP 06/19/2016 (Exact Date)   SpO2 96%   BMI 47.77 kg/m   Physical Exam  Constitutional: She is oriented to person, place, and time. She appears well-developed and well-nourished. No distress.  HENT:  Head: Normocephalic and atraumatic.  Nose: Nose normal.  Mouth/Throat: No oral lesions. No trismus in the jaw. No uvula swelling. Posterior oropharyngeal erythema present. No oropharyngeal exudate. Tonsils are 2+ on the right. Tonsils are 2+ on the left. No tonsillar exudate.  Eyes: Conjunctivae and EOM are normal. Pupils are equal, round, and reactive to light. Right eye exhibits no discharge. Left eye exhibits no discharge. No scleral icterus.  Neck: Normal range of motion. Neck supple.  Cardiovascular: Normal rate and regular rhythm.  Exam reveals no gallop and no friction rub.   No murmur heard. Pulmonary/Chest: Effort normal and breath sounds normal. No stridor. No respiratory distress. She has no rales.  Abdominal: Soft. She exhibits no distension. There is no tenderness.  Musculoskeletal: She exhibits no edema or tenderness.  Lymphadenopathy:       Head (left side): Submandibular (with TTP) and tonsillar adenopathy present.  Neurological: She is alert and oriented to person, place, and time.  Skin: Skin is warm and dry. No rash noted. She is not diaphoretic. No erythema.  Psychiatric: She has a normal mood and affect.  Vitals reviewed.    ED Treatments / Results  Labs (all labs ordered are listed, but only abnormal results are displayed) Labs Reviewed  COMPREHENSIVE METABOLIC PANEL - Abnormal; Notable for the following:       Result Value   Sodium 134 (*)    Potassium 3.1 (*)    Chloride 99 (*)    BUN 5 (*)    Calcium 8.7 (*)    Total Protein 8.6 (*)    Albumin 3.1 (*)    All other components within normal limits  CBC - Abnormal; Notable for  the following:    WBC 11.7 (*)    All other components within normal limits  URINALYSIS, ROUTINE W REFLEX MICROSCOPIC - Abnormal; Notable for the following:    Hgb urine dipstick SMALL (*)    Leukocytes, UA SMALL (*)    Squamous Epithelial / LPF 0-5 (*)    All other components within normal limits  RAPID STREP SCREEN (NOT AT [facility])  CULTURE, GROUP A STREP ([facility])  LIPASE, BLOOD  PREGNANCY, URINE  I-STAT BETA HCG BLOOD, ED ([facility], [facility], [facility] ONLY)    EKG  EKG Interpretation None       Radiology No results found.  Procedures Procedures (including critical care time)  Medications Ordered in ED Medications  dexamethasone (DECADRON) injection 10 mg (10 mg Intramuscular Given 06/28/16 2251)  amoxicillin-clavulanate (AUGMENTIN) 875-125 MG per tablet 1 tablet (1 tablet Oral Given 06/28/16 2251)  ondansetron (ZOFRAN-ODT) disintegrating tablet 4 mg (4 mg Oral Given 06/28/16 2251)     Initial Impression / Assessment and Plan / ED Course  I have reviewed the triage vital signs and the nursing notes.  Pertinent labs & imaging results that were available during my care of the patient were reviewed by me and considered in my medical decision making (see chart for details).  Clinical Course as of Jun 29 126  Fri Jun 28, 2016  2136 1. 2 days of URI symptoms with sore throat. Exam concerning for a peritonsillar abscess on the left side. Not concerning for Ludwig angina. Rapid strep negative. However given exam and symptoms I will cover with antibiotics.  [PC]  2137 2. Epigastric abd pain 1 day of epigastric abdominal pain. Mild discomfort to the epigastrium. Labs without evidence of biliary obstruction; doubt acute cholecystitis. Negative lipase, doubt pancreatitis. Low suspicion for small bowel obstruction.  [PC]    Clinical Course User Index [PC] Nira ConnPedro Eduardo Elliott Lasecki, MD    Given decadron and first ABx dose.  The patient is safe for discharge with strict return precautions.   Final  Clinical Impressions(s) / ED Diagnoses   Final diagnoses:  Pharyngitis due to other organism  Epigastric pain   Disposition: Discharge  Condition: Good  I have discussed the results, Dx and Tx plan with the patient who expressed understanding and agree(s) with the plan. Discharge instructions discussed at great length. The patient was given strict return precautions who verbalized understanding of the instructions. No further questions at time of discharge.    Discharge Medication List as of 06/28/2016 11:30 PM    START taking these medications   Details  amoxicillin-clavulanate (AUGMENTIN) 875-125 MG tablet Take 1 tablet by mouth every 12 (twelve) hours., Starting Fri 06/28/2016, Until Mon 07/08/2016, Print        Follow Up: primary care provider  Schedule an appointment as soon as possible for a visit  in 3-5 days, If symptoms do not improve or  worsen  [facility] [address] [id number] [place] [phone] Go to  if you are unable to hold fluids or medication down due to vomiting or inability to swallow or breath.      Nira ConnPedro Eduardo Samayra Hebel, MD 06/29/16 [id number]

## 2016-06-28 NOTE — ED Triage Notes (Signed)
No answer when called for triage 

## 2016-06-30 LAB — CULTURE, GROUP A STREP (THRC)

## 2016-07-02 ENCOUNTER — Encounter (HOSPITAL_COMMUNITY): Payer: Self-pay

## 2016-07-02 ENCOUNTER — Emergency Department (HOSPITAL_COMMUNITY)
Admission: EM | Admit: 2016-07-02 | Discharge: 2016-07-02 | Disposition: A | Discharge status: Home / Self Care | Payer: Medicaid Other | Attending: Emergency Medicine | Admitting: Emergency Medicine

## 2016-07-02 DIAGNOSIS — I10 Essential (primary) hypertension: Secondary | ICD-10-CM | POA: Diagnosis not present

## 2016-07-02 DIAGNOSIS — R51 Headache: Secondary | ICD-10-CM | POA: Insufficient documentation

## 2016-07-02 DIAGNOSIS — J029 Acute pharyngitis, unspecified: Secondary | ICD-10-CM | POA: Insufficient documentation

## 2016-07-02 DIAGNOSIS — Z87891 Personal history of nicotine dependence: Secondary | ICD-10-CM | POA: Diagnosis not present

## 2016-07-02 DIAGNOSIS — Z79899 Other long term (current) drug therapy: Secondary | ICD-10-CM | POA: Diagnosis not present

## 2016-07-02 MED ORDER — LIDOCAINE VISCOUS 2 % MT SOLN
15.0000 mL | Freq: Once | OROMUCOSAL | Status: AC
  Administered 2016-07-02: 15 mL via OROMUCOSAL
  Filled 2016-07-02: qty 15

## 2016-07-02 MED ORDER — IBUPROFEN 400 MG PO TABS
600.0000 mg | ORAL_TABLET | Freq: Once | ORAL | Status: AC
  Administered 2016-07-02: 600 mg via ORAL
  Filled 2016-07-02: qty 1

## 2016-07-02 MED ORDER — MAGIC MOUTHWASH W/LIDOCAINE
5.0000 mL | Freq: Three times a day (TID) | ORAL | 0 refills | Status: DC | PRN
Start: 2016-07-02 — End: 2016-09-06

## 2016-07-02 NOTE — ED Provider Notes (Signed)
[facility] Provider Note   CSN: [id number] Arrival date & time: 07/02/16  1225     History   Chief Complaint Chief Complaint  Patient presents with  . Headache  . Sore Throat    HPI Shirley [name] is a 26 y.o. female.  HPI   This is a 26 year old female the past medical history of hypertension, obesity who presents today with sore throat. She states she's had a headache associated with this. Was seen on 06/28/2016 with similar symptoms. Discharged home with amoxicillin after getting a dose here and a dose of Decadron. Patient states she's not gotten her prescription filled because it was too cold outside and the pharmacy was closed on New Year's Day. She states she's had a slight cough with this but no runny nose. She is able to swallow just with pain. She's not had any nausea, vomiting, diarrhea.  Reviewed the patient's throat culture that was obtained here and she did not grow out any group A strep.  Past Medical History:  Diagnosis Date  . Arthritis   . Hypertension   . No significant past medical history   . Obesity     Patient Active Problem List   Diagnosis Date Noted  . Osteoarthritis of both knees 09/29/2015  . 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  . Low back pain with right-sided sciatica 09/15/2015  . 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 Submandibular abscess 10/18/2013  . OBESITY, MORBID 12/02/2006  . Recurrent boils 10/21/2006  . Knee pain, chronic 09/04/2006  . HTN (hypertension) 08/28/2006  . 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 TOOTH EXTRACTION N/A 10/22/2013   Procedure: DENTAL EXTRACTIONS TEETH #1, 16, 17, 32;  Surgeon: Georgia LopesScott M Jensen, DDS;  Location: [facility] OR;  Service: Oral Surgery;  Laterality: N/A;    OB History    Gravida Para Term Preterm AB Living   3       1 2    SAB TAB Ectopic Multiple Live Births           2       Home Medications    Prior to Admission medications    Medication Sig Start Date End Date Taking? Authorizing Provider  amoxicillin-clavulanate (AUGMENTIN) 875-125 MG tablet Take 1 tablet by mouth every 12 (twelve) hours. 06/28/16 07/08/16  Nira ConnPedro Eduardo Cardama, MD  DULoxetine (CYMBALTA) 30 MG capsule Take 1 capsule (30 mg total) by mouth daily. Take 1 capsule daily for 1 week, then increase to 2 capsules daily. 05/27/16   Wendee Beaversavid J McMullen, DO  hydrochlorothiazide (MICROZIDE) 12.5 MG capsule Take 1 capsule (12.5 mg total) by mouth daily. 02/20/16   Denton N Rumley, DO  magic mouthwash w/lidocaine SOLN Take 5 mLs by mouth 3 (three) times daily as needed for mouth pain. 07/02/16   Madolyn FriezeVijay Keydi Giel, MD  naproxen sodium (ANAPROX) 220 MG tablet Take 220 mg by mouth 2 (two) times daily with a meal.    Historical Provider, MD    Family History Family History  Problem Relation Age of Onset  . Hypertension Mother   . Diabetes Maternal Grandmother   . Diabetes Paternal Grandmother   . Stroke Paternal Grandmother     Social History Social History  Substance Use Topics  . Smoking status: Former Smoker    Packs/day: 0.00    Quit date: 09/15/2015  . Smokeless tobacco: Never Used  . Alcohol use 0.0 oz/week     Comment: rare  Allergies   Patient has no known allergies.   Review of Systems Review of Systems  Constitutional: Negative for chills and fever.  HENT: Positive for congestion and sore throat. Negative for ear pain.   Respiratory: Positive for cough. Negative for shortness of breath.   Gastrointestinal: Negative for abdominal pain, nausea and vomiting.  Neurological: Positive for headaches.  All other systems reviewed and are negative.    Physical Exam Updated Vital Signs BP 114/75 (BP Location: Left Arm)   Pulse 62   Temp 98.7 F (37.1 C) (Oral)   Resp 18   LMP 06/19/2016 (Exact Date)   SpO2 100%   Physical Exam  Constitutional: She appears well-developed and well-nourished. No distress.  HENT:  Head: Normocephalic and  atraumatic.  Mouth/Throat: Posterior oropharyngeal erythema present. No oropharyngeal exudate, posterior oropharyngeal edema or tonsillar abscesses.  Eyes: Conjunctivae are normal.  Neck: Neck supple.  Cardiovascular: Normal rate and regular rhythm.   No murmur heard. Pulmonary/Chest: Effort normal and breath sounds normal. No respiratory distress.  Abdominal: Soft. There is no tenderness.  Musculoskeletal: She exhibits no edema.  Neurological: She is alert.  Skin: Skin is warm and dry.  Psychiatric: She has a normal mood and affect.  Nursing note and vitals reviewed.    ED Treatments / Results  Labs (all labs ordered are listed, but only abnormal results are displayed) Labs Reviewed - No data to display  EKG  EKG Interpretation None       Radiology No results found.  Procedures Procedures (including critical care time)  Medications Ordered in ED Medications  lidocaine (XYLOCAINE) 2 % viscous mouth solution 15 mL (15 mLs Mouth/Throat Given 07/02/16 1849)  ibuprofen (ADVIL,MOTRIN) tablet 600 mg (600 mg Oral Given 07/02/16 1848)     Initial Impression / Assessment and Plan / ED Course  I have reviewed the triage vital signs and the nursing notes.  Pertinent labs & imaging results that were available during my care of the patient were reviewed by me and considered in my medical decision making (see chart for details).  Clinical Course     Patient presents today with sore throat that has been ongoing for 5 days. Patient did not comply with antibiotics that were prescribed on her last visit. States that she is continuing to have sore throat and this is causing issues of being able to take ever kids. On my exam, patient is tolerating her secretions. She has no palatal for lesser evidence of peritonsillar abscess. She has erythema in her oropharynx without any exudate. Her cultures are not consistent with group A strep. Do not feel the Anaprox are warranted at this time. Gave  the patient viscous lidocaine here in addition to ibuprofen.  We'll discharge home with short course of medical mouth wash. Advised taking ibuprofen every 6 hours as needed for pain. Patient agreeable and discharged home in good condition.  Final Clinical Impressions(s) / ED Diagnoses   Final diagnoses:  Sore throat    New Prescriptions New Prescriptions   MAGIC MOUTHWASH W/LIDOCAINE SOLN    Take 5 mLs by mouth 3 (three) times daily as needed for mouth pain.     Madolyn Frieze, MD 07/02/16 1900    Lyndal Pulley, MD 07/03/16 [phone]

## 2016-07-02 NOTE — ED Notes (Signed)
Pt called for vital reassessment with no answer 

## 2016-07-02 NOTE — ED Triage Notes (Signed)
To triage via EMS.  Onset 5 days headache, sore throat.  Seen 06-28-16 dx:  Pharyngitis, has not gotten antibiotic filled.

## 2016-07-02 NOTE — Discharge Instructions (Signed)
Use the mouthwash as needed for your pain. Take ibuprofen 400 mg (two tablets) every 6 hours as needed for pain.

## 2016-07-08 ENCOUNTER — Ambulatory Visit: Payer: Medicaid Other | Admitting: Internal Medicine

## 2016-07-11 ENCOUNTER — Ambulatory Visit: Payer: Medicaid Other | Admitting: Internal Medicine

## 2016-07-30 ENCOUNTER — Ambulatory Visit: Payer: Medicaid Other | Admitting: Family Medicine

## 2016-08-09 ENCOUNTER — Ambulatory Visit: Payer: Medicaid Other | Admitting: Family Medicine

## 2016-08-09 NOTE — Progress Notes (Deleted)
   Subjective:   Patient ID: Arlyss RepressSacoya M Scheper    DOB: 1990-09-16, 26 y.o. female   MRN: [id number]  CC: "***"  HPI: Arlyss RepressSacoya M Soderman is a 26 y.o. female who presents to clinic today ***. Problems discussed today are as follows:  ***: ***  ***: ***  ROS: complete ROS performed, see HPI for pertinent ROS.  PMFSH: ***. Smoking status reviewed. Medications reviewed.  Objective:   There were no vitals taken for this visit. Vitals and nursing note reviewed.  General: well nourished, well developed, in no acute distress with non-toxic appearance HEENT: normocephalic, atraumatic, moist mucous membranes Neck: supple, non-tender without lymphadenopathy CV: regular rate and rhythm without murmurs, rubs, or gallops, no lower extremity edema Lungs: clear to auscultation bilaterally with normal work of breathing Abdomen: soft, non-tender, non-distended, no masses or organomegaly palpable, normoactive bowel sounds Skin: warm, dry, no rashes or lesions, cap refill < 2 seconds Extremities: warm and well perfused, normal tone  Assessment & Plan:   No problem-specific Assessment & Plan notes found for this encounter.  No orders of the defined types were placed in this encounter.  No orders of the defined types were placed in this encounter.   Durward Parcelavid McMullen, DO [facility], PGY-1 08/09/2016 12:09 PM

## 2016-09-06 ENCOUNTER — Encounter: Payer: Self-pay | Admitting: Family Medicine

## 2016-09-06 ENCOUNTER — Telehealth: Payer: Self-pay | Admitting: *Deleted

## 2016-09-06 ENCOUNTER — Ambulatory Visit (INDEPENDENT_AMBULATORY_CARE_PROVIDER_SITE_OTHER): Payer: Medicaid Other | Admitting: Family Medicine

## 2016-09-06 VITALS — BP 158/98 | HR 54 | Temp 98.2°F | Ht 67.0 in | Wt 316.8 lb

## 2016-09-06 DIAGNOSIS — M25561 Pain in right knee: Secondary | ICD-10-CM

## 2016-09-06 DIAGNOSIS — M25562 Pain in left knee: Secondary | ICD-10-CM | POA: Diagnosis not present

## 2016-09-06 DIAGNOSIS — G8929 Other chronic pain: Secondary | ICD-10-CM | POA: Diagnosis not present

## 2016-09-06 DIAGNOSIS — I10 Essential (primary) hypertension: Secondary | ICD-10-CM

## 2016-09-06 MED ORDER — MELOXICAM 15 MG PO TABS: 15.0000 mg | ORAL_TABLET | Freq: Every day | ORAL | 0 refills | Status: DC

## 2016-09-06 NOTE — Assessment & Plan Note (Addendum)
Chronic. Uncontrolled. Tramadol and Cymbalta w/o relief. Stress on knees 2/2 obesity. --See obesity plan --Mobic 15 mg QD --Instructed to supplement with tylenol and ibuprofen q6h PRN if pain is severe --Encouraged to walk daily to help keep knees mobile and body active

## 2016-09-06 NOTE — Assessment & Plan Note (Signed)
>>  ASSESSMENT AND PLAN FOR PRIMARY HYPERTENSION WRITTEN ON 09/06/2016 10:16 PM BY MCMULLEN, DAVID J, DO  Chronic. Uncontrolled. On HCTZ but non-adherent due to polyuria. BP 158/98.  --Continue HCTZ 12.5 mg QD --Educated on importance and consequences for uncontrolled HTN --Encouraged to avoid excessive salts and avoid heavy amount of water and fruits to decrease diruesis --RTC in 1 week for recheck of BP

## 2016-09-06 NOTE — Patient Instructions (Signed)
Thank you for coming in to see [place] today. Please see below to review our plan for today's visit.  1. Take your HCTZ for high blood pressure. This is important to prevent irreversible damage to your health.  2. I have given you a prescription for a medication called Mobic (see information below). You will take this medication daily as prescribed. I have also referred you to the bariatric clinic for further management of your obesity. 3. Return to clinic in 1 week for reassessment of your blood pressure.  Please call the clinic at [phone] if your symptoms worsen or you have any concerns. It was my pleasure to see you. -- Durward Parcel, DO [facility], PGY-1  Meloxicam oral suspension What is this medicine? MELOXICAM (mel OX i cam) is a non-steroidal anti-inflammatory drug (NSAID). It is used to reduce swelling and to treat pain. It may be used for osteoarthritis, rheumatoid arthritis, or juvenile rheumatoid arthritis. This medicine may be used for other purposes; ask your health care provider or pharmacist if you have questions. COMMON BRAND NAME(S): Mobic What should I tell my health care provider before I take this medicine? They need to know if you have any of these conditions: -bleeding disorders -cigarette smoker -coronary artery bypass graft (CABG) surgery within the past 2 weeks -drink more than 3 alcohol-containing drinks per day -heart disease -high blood pressure -history of stomach bleeding -kidney disease -liver disease -lung or breathing disease, like asthma -stomach or intestine problems -an unusual or allergic reaction to meloxicam, aspirin, other NSAIDs, other medicines, foods, dyes, or preservatives -pregnant or trying to get pregnant -breast-feeding How should I use this medicine? Take this medicine by mouth. Follow the directions on the prescription label. Shake well before using. Use a specially marked spoon or dropper to measure each dose.  Ask your pharmacist if you do not have one. Household spoons are not accurate. You can take it with or without food. If it upsets your stomach, take it with food. Take your medicine at regular intervals. Do not take it more often than directed. Do not stop taking except on your doctor's advice. A special MedGuide will be given to you by the pharmacist with each prescription and refill. Be sure to read this information carefully each time. Talk to your pediatrician regarding the use of this medicine in children. While this drug may be prescribed for children as young as 2 years for selected conditions, precautions do apply. Patients over 76 years old may have a stronger reaction and need a smaller dose. Overdosage: If you think you have taken too much of this medicine contact a poison control center or emergency room at once. NOTE: This medicine is only for you. Do not share this medicine with others. What if I miss a dose? If you miss a dose, take it as soon as you can. If it is almost time for your next dose, take only that dose. Do not take double or extra doses. What may interact with this medicine? Do not take this medicine with any of the following medications: -cidofovir -ketorolac This medicine may also interact with the following medications: -aspirin and aspirin-like medicines -certain medicines for blood pressure, heart disease, irregular heart beat -certain medicines for depression, anxiety, or psychotic disturbances -certain medicines that treat or prevent blood clots like warfarin, enoxaparin, dalteparin, apixaban, dabigatran, rivaroxaban -cyclosporine -digoxin -diuretics -methotrexate -other NSAIDs, medicines for pain and inflammation, like ibuprofen and naproxen -pemetrexed This list may not describe all  possible interactions. Give your health care provider a list of all the medicines, herbs, non-prescription drugs, or dietary supplements you use. Also tell them if you smoke,  drink alcohol, or use illegal drugs. Some items may interact with your medicine. What should I watch for while using this medicine? Tell your doctor or healthcare professional if your symptoms do not start to get better or if they get worse. Do not take other medicines that contain aspirin, ibuprofen, or naproxen with this medicine. Side effects such as stomach upset, nausea, or ulcers may be more likely to occur. Many medicines available without a prescription should not be taken with this medicine. This medicine can cause ulcers and bleeding in the stomach and intestines at any time during treatment. This can happen with no warning and may cause death. There is increased risk with taking this medicine for a long time. Smoking, drinking alcohol, older age, and poor health can also increase risks. Call your doctor right away if you have stomach pain or blood in your vomit or stool. This medicine does not prevent heart attack or stroke. In fact, this medicine may increase the chance of a heart attack or stroke. The chance may increase with longer use of this medicine and in people who have heart disease. If you take aspirin to prevent heart attack or stroke, talk with your doctor or health care professional. What side effects may I notice from receiving this medicine? Side effects that you should report to your doctor or health care professional as soon as possible: -allergic reactions like skin rash, itching or hives, swelling of the face, lips, or tongue -nausea, vomiting -signs and symptoms of a blood clot such as breathing problems; changes in vision; chest pain; severe, sudden headache; pain, swelling, warmth in the leg; trouble speaking; sudden numbness or weakness of the face, arm, or leg -signs and symptoms of bleeding such as bloody or black, tarry stools; red or dark-brown urine; spitting up blood or brown material that looks like coffee grounds; red spots on the skin; unusual bruising or  bleeding from the eye, gums, or nose -signs and symptoms of liver injury like dark yellow or brown urine; general ill feeling or flu-like symptoms; light-colored stools; loss of appetite; nausea; right upper belly pain; unusually weak or tired; yellowing of the eyes or skin -signs and symptoms of stroke like changes in vision; confusion; trouble speaking or understanding; severe headaches; sudden numbness or weakness of the face, arm, or leg; trouble walking; dizziness; loss of balance or coordination Side effects that usually do not require medical attention (report to your doctor or health care professional if they continue or are bothersome): -constipation -diarrhea -gas This list may not describe all possible side effects. Call your doctor for medical advice about side effects. You may report side effects to FDA at 1-800-FDA-1088. Where should I keep my medicine? Keep out of the reach of children. Store at room temperature between 15 and 30 degrees C (59 and 86 degrees F). Throw away any unused medicine after the expiration date. NOTE: This sheet is a summary. It may not cover all possible information. If you have questions about this medicine, talk to your doctor, pharmacist, or health care provider.  2018 Elsevier/Gold Standard (2015-07-20 13:12:05)

## 2016-09-06 NOTE — Assessment & Plan Note (Addendum)
Chronic. Most likely source for chronic back and knee pain. Pt understands obesity is problem and seeks to lose weight. --Referral to general surgery for bariatric services --Educated importance of having daily exercise and ways to avoid excessive stress on knees --Diet to restrict carb and sugar intake including fruits

## 2016-09-06 NOTE — Assessment & Plan Note (Addendum)
Chronic. Uncontrolled. On HCTZ but non-adherent due to polyuria. BP 158/98.  --Continue HCTZ 12.5 mg QD --Educated on importance and consequences for uncontrolled HTN --Encouraged to avoid excessive salts and avoid heavy amount of water and fruits to decrease diruesis --RTC in 1 week for recheck of BP

## 2016-09-06 NOTE — Progress Notes (Signed)
Subjective:   Patient ID: Shirley [name]    DOB: Aug 03, 1990, 26 y.o. female   MRN: [id number]  CC: "leg pain"  HPI: Shirley [name] is a 26 y.o. female who presents to clinic today with chronic leg pain. Problems discussed today are as follows:  Leg pain: ongoing issue located at knees b/l. Says she was taking Cymbalta w/o relief. Had some old tramadol at home taking 4 pills daily for 1 month w/o improvement. Pt has put a lot of time researching reason for pain online and brought a list of ddx she thinks it could be. Denies fevers, SOB, CP, leg edema, saddle anesthesia.   Obesity: pt says she does believe her weight has to do with her chronic back and knee problems. Says she feels tight around waits which puts stress on her legs. Making efforts to eat lots of vegetables but has not made attempts to exercise. Says she should find a gym. Was attempting to file for disability but rejected. Says she cannot work at [employer] because "she is too slow."  High blood pressure: pt states she "sometimes takes it." Has not taken HCTZ in 4-5 days. Says she has to urinate "every hour" while on the pill.   ROS: complete ROS performed, see HPI for pertinent ROS.  PMFSH: HTN, morbid obesity, OA knees b/l, sciatica neuralgia, spinal stenosis of lumbar w/ radiculopathy. Smoking status reviewed. Medications reviewed.  Objective:   BP (!) 158/98   Pulse (!) 54   Temp 98.2 F (36.8 C) (Oral)   Ht 5\' 7"  (1.702 m)   Wt (!) 316 lb 12.8 oz (143.7 kg)   SpO2 98%   BMI 49.62 kg/m  Vitals and nursing note reviewed.  General: morbidly obese, well nourished, well developed, mild acute distress while ambulating with non-toxic appearance HEENT: normocephalic, atraumatic, moist mucous membranes Neck: supple, non-tender without lymphadenopathy CV: regular rate and rhythm without murmurs, rubs, or gallops, no lower extremity edema Lungs: clear to auscultation bilaterally with normal work of breathing Abdomen:  soft, non-tender, non-distended, normoactive bowel sounds Skin: warm, dry, no rashes or lesions, cap refill < 2 seconds Extremities: warm and well perfused, normal tone Gait: slow but stable, does not favor one leg over another  Assessment & Plan:   Obesity, Class III, BMI 40-49.9 (morbid obesity) (HCC) Chronic. Most likely source for chronic back and knee pain. Pt understands obesity is problem and seeks to lose weight. --Referral to general surgery for bariatric services --Educated importance of having daily exercise and ways to avoid excessive stress on knees --Diet to restrict carb and sugar intake including fruits  Primary hypertension Chronic. Uncontrolled. On HCTZ but non-adherent due to polyuria. BP 158/98.  --Continue HCTZ 12.5 mg QD --Educated on importance and consequences for uncontrolled HTN --Encouraged to avoid excessive salts and avoid heavy amount of water and fruits to decrease diruesis --RTC in 1 week for recheck of BP  Knee pain, chronic Chronic. Uncontrolled. Tramadol and Cymbalta w/o relief. Stress on knees 2/2 obesity. --See obesity plan --Mobic 15 mg QD --Instructed to supplement with tylenol and ibuprofen q6h PRN if pain is severe --Encouraged to walk daily to help keep knees mobile and body active  Orders Placed This Encounter  Procedures  . Ambulatory referral to General Surgery    Referral Priority:   Elective    Referral Type:   Surgical    Referral Reason:   Specialty Services Required    Requested Specialty:   General Surgery  Number of Visits Requested:   1   Meds ordered this encounter  Medications  . meloxicam (MOBIC) 15 MG tablet    Sig: Take 1 tablet (15 mg total) by mouth daily.    Dispense:  30 tablet    Refill:  0    Durward Parcelavid McMullen, DO [facility], PGY-1 09/06/2016 10:17 PM

## 2016-09-06 NOTE — Telephone Encounter (Signed)
Transfer request forms dropped off for at front desk for completion.  Verified that patient section of form has been completed.  Last DOS/WCC with PCP was 09/26/16 Placed form in Red team folder to be completed by clinical staff.  William Schake Pernell DupreAdams

## 2016-09-10 NOTE — Telephone Encounter (Signed)
Reviewed form, placed in PCP box.

## 2016-09-13 ENCOUNTER — Telehealth: Payer: Self-pay | Admitting: Family Medicine

## 2016-09-13 NOTE — Telephone Encounter (Signed)
Patient informed that forms are complete and ready for pickup.  Martin, Tamika L, RN  

## 2016-09-13 NOTE — Telephone Encounter (Signed)
Reviewed, completed, and signed form.  Note routed to RN team inbasket and placed completed form in Clinic RN's office (wall pocket above desk).  Armando Lauman J Cynde Menard, DO  

## 2016-09-17 ENCOUNTER — Ambulatory Visit: Payer: Medicaid Other | Admitting: Family Medicine

## 2016-10-09 ENCOUNTER — Other Ambulatory Visit: Payer: Self-pay | Admitting: Family Medicine

## 2016-10-09 MED ORDER — MELOXICAM 15 MG PO TABS: 15.0000 mg | ORAL_TABLET | Freq: Every day | ORAL | 0 refills | Status: DC

## 2016-10-09 NOTE — Telephone Encounter (Signed)
Pt calling to request refill of:  Name of Medication(s):  mobic (pt lost Rx) Last date of OV:  09-06-16 Pharmacy:  [facility] [place]   Will route refill request to Clinic RN.  Discussed with patient policy to call pharmacy for future refills.  Also, discussed refills may take up to 48 hours to approve or deny.  Shirley [name]

## 2016-10-09 NOTE — Telephone Encounter (Signed)
Will fill prescription at this time. I see patient has appointment 4/18. Thanks. -- Durward Parcel, DO [facility], PGY-1

## 2016-10-16 ENCOUNTER — Ambulatory Visit: Payer: Medicaid Other | Admitting: Internal Medicine

## 2016-10-28 ENCOUNTER — Ambulatory Visit: Payer: Medicaid Other | Admitting: Family Medicine

## 2016-10-28 NOTE — Progress Notes (Deleted)
   Subjective:   Patient ID: Shirley [name]    DOB: [birth year], 26 y.o. female   MRN: [id number]  CC: "***"  HPI: Shirley [name] is a 26 y.o. female who presents to clinic today ***. Problems discussed today are as follows:  ***: ***  ***Last seen 08/2016 for ongoing bilateral knee pain. Patient taking Cymbalta without relief. Was taking tramadol one month without improvement. Prescribed Mobic 15 mg daily. Instructed to supplement Tylenol with ibuprofen. Encouraged to walk daily. Discussed obesity plan 4. Strict service. Make sure patient had adherent HCTZ. Discuss need for PAP update. Tdap?  ROS: complete ROS performed, see HPI for pertinent ROS.  PMFSH: HTN, morbid obesity, OA knees b/l, sciatica neuralgia, spinal stenosis of lumbar w/ radiculopathy. Smoking status reviewed. Medications reviewed.  Objective:   There were no vitals taken for this visit. Vitals and nursing note reviewed.  General: well nourished, well developed, in no acute distress with non-toxic appearance HEENT: normocephalic, atraumatic, moist mucous membranes Neck: supple, non-tender without lymphadenopathy CV: regular rate and rhythm without murmurs, rubs, or gallops, no lower extremity edema Lungs: clear to auscultation bilaterally with normal work of breathing Abdomen: soft, non-tender, non-distended, no masses or organomegaly palpable, normoactive bowel sounds Skin: warm, dry, no rashes or lesions, cap refill < 2 seconds Extremities: warm and well perfused, normal tone  Assessment & Plan:   No problem-specific Assessment & Plan notes found for this encounter.  No orders of the defined types were placed in this encounter.  No orders of the defined types were placed in this encounter.   Durward Parcel, DO [facility], PGY-1 10/28/2016 12:06 PM

## 2016-11-14 ENCOUNTER — Ambulatory Visit: Payer: Medicaid Other | Admitting: Family Medicine

## 2016-11-14 ENCOUNTER — Telehealth: Payer: Self-pay | Admitting: Family Medicine

## 2016-11-14 NOTE — Telephone Encounter (Signed)
Called patient due to missed appointment for chronic knee pain. Patient has missed last 3 appointments (4/18, 4/30, 5/17). Patient answered stating she forgot she had a court date today. Says she would like to be seen for another referral to bariatric services. Emphasized importance of making appointments. Patient states she understands. Patient to call clinic and schedule follow-up appointment.

## 2016-12-06 ENCOUNTER — Encounter: Payer: Self-pay | Admitting: Family Medicine

## 2016-12-06 DIAGNOSIS — Z91199 Patient's noncompliance with other medical treatment and regimen due to unspecified reason: Secondary | ICD-10-CM | POA: Insufficient documentation

## 2016-12-06 DIAGNOSIS — Z5329 Procedure and treatment not carried out because of patient's decision for other reasons: Secondary | ICD-10-CM | POA: Insufficient documentation

## 2016-12-06 NOTE — Progress Notes (Signed)
Subjective  Patient is presenting with the following illnesses     Chief Complaint noted Review of Symptoms - see HPI PMH - Smoking status noted.     Objective Vital Signs reviewed     Assessments/Plans  No problem-specific Assessment & Plan notes found for this encounter.   See Encounter view if individual problem A/Ps not visible See after visit summary for details of patient instuctions 

## 2016-12-17 ENCOUNTER — Encounter: Payer: Self-pay | Admitting: Internal Medicine

## 2016-12-17 ENCOUNTER — Ambulatory Visit (INDEPENDENT_AMBULATORY_CARE_PROVIDER_SITE_OTHER): Payer: Self-pay | Admitting: Internal Medicine

## 2016-12-17 VITALS — BP 130/80 | HR 78 | Temp 98.5°F | Wt 302.0 lb

## 2016-12-17 DIAGNOSIS — M5441 Lumbago with sciatica, right side: Secondary | ICD-10-CM

## 2016-12-17 DIAGNOSIS — G8929 Other chronic pain: Secondary | ICD-10-CM

## 2016-12-17 DIAGNOSIS — M48061 Spinal stenosis, lumbar region without neurogenic claudication: Secondary | ICD-10-CM

## 2016-12-17 DIAGNOSIS — M5416 Radiculopathy, lumbar region: Secondary | ICD-10-CM

## 2016-12-17 DIAGNOSIS — M25561 Pain in right knee: Secondary | ICD-10-CM

## 2016-12-17 DIAGNOSIS — S6990XA Unspecified injury of unspecified wrist, hand and finger(s), initial encounter: Secondary | ICD-10-CM

## 2016-12-17 MED ORDER — PREGABALIN 75 MG PO CAPS: 75.0000 mg | ORAL_CAPSULE | Freq: Two times a day (BID) | ORAL | 0 refills | Status: DC

## 2016-12-17 NOTE — Patient Instructions (Signed)
Try Lyrica 1 tablet at bedtime first then 1 tablet twice a day if you tolerate the medication I am referring you to orthopedics
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
## 2016-12-18 NOTE — Assessment & Plan Note (Signed)
Symptoms seem neuropathic in etiology. MRI Lumbar spine in 05/2016 with foraminal narrowing but nerve impingement. Also notes disc bulging. No improvement with Gabapentin in the past. Will try Lyrica. Recommend continued back exercises. PT helped but insurance will not pay for further sessions. Will also refer to orthopedics for further recommendations.

## 2016-12-18 NOTE — Assessment & Plan Note (Signed)
Symptoms are more consistent with chronic meniscal tear. Will refer to orthopedics for further management.

## 2017-01-02 ENCOUNTER — Telehealth: Payer: Self-pay | Admitting: Internal Medicine

## 2017-01-02 NOTE — Telephone Encounter (Signed)
Called patient to discuss that referral cannot be made to ortho due to insurance. Went to [employer], left message to call back

## 2017-02-18 ENCOUNTER — Encounter (HOSPITAL_COMMUNITY): Payer: Self-pay

## 2017-02-18 DIAGNOSIS — M5431 Sciatica, right side: Secondary | ICD-10-CM | POA: Insufficient documentation

## 2017-02-18 NOTE — ED Triage Notes (Signed)
Pt endorses right lower back pain and has hx of sciatica and states this feels the same. Denies urinary sx or urinary incontinence. VSS.

## 2017-02-18 NOTE — ED Notes (Signed)
Pt now at triage.

## 2017-02-19 ENCOUNTER — Emergency Department (HOSPITAL_COMMUNITY)
Admission: EM | Admit: 2017-02-19 | Discharge: 2017-02-19 | Disposition: A | Discharge status: Home / Self Care | Payer: Medicaid Other | Attending: Emergency Medicine | Admitting: Emergency Medicine

## 2017-02-19 DIAGNOSIS — M5431 Sciatica, right side: Secondary | ICD-10-CM

## 2017-02-19 HISTORY — DX: Sciatica, unspecified side: M54.30

## 2017-02-19 MED ORDER — PREDNISONE 20 MG PO TABS
60.0000 mg | ORAL_TABLET | Freq: Once | ORAL | Status: AC
  Administered 2017-02-19: 60 mg via ORAL

## 2017-02-19 MED ORDER — PREDNISONE 20 MG PO TABS: 40.0000 mg | ORAL_TABLET | Freq: Every day | ORAL | 0 refills | Status: DC

## 2017-02-19 MED ORDER — CYCLOBENZAPRINE HCL 10 MG PO TABS: 10.0000 mg | ORAL_TABLET | Freq: Two times a day (BID) | ORAL | 0 refills | Status: DC | PRN

## 2017-02-19 NOTE — ED Notes (Signed)
Provider notified about high BP, pt denies headaches and instructed to monitor BP with primary care. Pt stable and ambulatory for discharge

## 2017-02-19 NOTE — ED Notes (Signed)
See provider assessment 

## 2017-02-19 NOTE — ED Provider Notes (Signed)
[facility]-EMERGENCY DEPT Provider Note   CSN: [id number] Arrival date & time: 02/18/17  2145     History   Chief Complaint Chief Complaint  Patient presents with  . Back Pain  . Sciatica    HPI Shirley [name] is a 26 y.o. female.  Patient presents emergency department with chief complaint of low back pain that radiates to her right leg. She reports that she has had these symptoms intermittently for the past several years. She reports that they've recently been worsening. She reports increased pain with walking, bending, and sitting. She denies any fevers or chills. Denies any bowel or bladder incontinence, but states that she does sometimes have trouble making it to the bathroom because of pain while ambulating. She denies any numbness, weakness of her lower extremities, but does report some tingling and burning sensation down the back of her right leg. She denies any other associated symptoms. There are no additional modifying factors.   The history is provided by the patient. No language interpreter was used.    Past Medical History:  Diagnosis Date  . Arthritis   . Hypertension   . No significant past medical history   . Obesity   . Sciatica     Patient Active Problem List   Diagnosis Date Noted  . Frequent No-show for appointment 12/06/2016  . Osteoarthritis of both knees 09/29/2015  . 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  . Low back pain with right-sided sciatica 09/15/2015  . Submandibular abscess 10/18/2013  . Obesity, Class III, BMI 40-49.9 (morbid obesity) (HCC) 12/02/2006  . Recurrent boils 10/21/2006  . Knee pain, chronic 09/04/2006  . Primary hypertension 08/28/2006    Past Surgical History:  Procedure Laterality Date  . TOOTH EXTRACTION N/A 10/22/2013   Procedure: DENTAL EXTRACTIONS TEETH #1, 16, 17, 32;  Surgeon: Georgia Lopes, DDS;  Location: [facility] OR;  Service: Oral Surgery;  Laterality: N/A;    OB History    Gravida Para Term  Preterm AB Living   3       1 2    SAB TAB Ectopic Multiple Live Births           2       Home Medications    Prior to Admission medications   Medication Sig Start Date End Date Taking? Authorizing Provider  cyclobenzaprine (FLEXERIL) 10 MG tablet Take 1 tablet (10 mg total) by mouth 2 (two) times daily as needed for muscle spasms. 02/19/17   Roxy Horseman, PA-C  hydrochlorothiazide (MICROZIDE) 12.5 MG capsule Take 1 capsule (12.5 mg total) by mouth daily. Patient not taking: Reported on 09/06/2016 02/20/16   Araceli Bouche, DO  meloxicam (MOBIC) 15 MG tablet Take 1 tablet (15 mg total) by mouth daily. 10/09/16   Wendee Beavers, DO  predniSONE (DELTASONE) 20 MG tablet Take 2 tablets (40 mg total) by mouth daily. Take 40 mg by mouth daily for 3 days, then 20mg  by mouth daily for 3 days, then 10mg  daily for 3 days 02/19/17   Roxy Horseman, PA-C  pregabalin (LYRICA) 75 MG capsule Take 1 capsule (75 mg total) by mouth 2 (two) times daily. Start with 1 tab at bedtime and slowly increase to 1 tablet two  times daily 12/17/16   Palma Holter, MD    Family History Family History  Problem Relation Age of Onset  . Hypertension Mother   . Diabetes Maternal Grandmother   . Diabetes Paternal Grandmother   . Stroke Paternal Grandmother  Social History Social History  Substance Use Topics  . Smoking status: Current Every Day Smoker    Packs/day: 0.50    Types: Cigarettes  . Smokeless tobacco: Never Used  . Alcohol use 0.0 oz/week     Comment: rare     Allergies   Patient has no known allergies.   Review of Systems Review of Systems  Constitutional: Negative for chills and fever.  Gastrointestinal:       No bowel incontinence  Genitourinary:       No urinary incontinence  Musculoskeletal: Positive for arthralgias, back pain and myalgias.  Neurological:       No saddle anesthesia     Physical Exam Updated Vital Signs BP (!) 157/88 (BP Location: Right Arm)    Pulse 67   Temp 98.5 F (36.9 C) (Oral)   Resp 18   Ht 5\' 6"  (1.676 m)   Wt (!) 137 kg (302 lb)   LMP 02/17/2017 (Exact Date)   SpO2 99%   BMI 48.74 kg/m   Physical Exam  Physical Exam  Constitutional: Pt appears well-developed and well-nourished. No distress.  HENT:  Head: Normocephalic and atraumatic.  Mouth/Throat: Oropharynx is clear and moist. No oropharyngeal exudate.  Eyes: Conjunctivae are normal.  Neck: Normal range of motion. Neck supple.  No meningismus Cardiovascular: Normal rate, regular rhythm and intact distal pulses.   Pulmonary/Chest: Effort normal and breath sounds normal. No respiratory distress. Pt has no wheezes.  Abdominal: Pt exhibits no distension Musculoskeletal:  Lumbar tender to palpation, no bony CTLS spine tenderness, deformity, step-off, or crepitus Lymphadenopathy: Pt has no cervical adenopathy.  Neurological: Pt is alert and oriented Speech is clear and goal oriented, follows commands Normal 5/5 strength in upper and lower extremities bilaterally including dorsiflexion and plantar flexion, strong and equal grip strength Sensation intact Great toe extension intact Moves extremities without ataxia, coordination intact antalgic gait Normal balance No Clonus Skin: Skin is warm and dry. No rash noted. Pt is not diaphoretic. No erythema.  Psychiatric: Pt has a normal mood and affect. Behavior is normal.  Nursing note and vitals reviewed.  ED Treatments / Results  Labs (all labs ordered are listed, but only abnormal results are displayed) Labs Reviewed - No data to display  EKG  EKG Interpretation None       Radiology No results found.  Procedures Procedures (including critical care time)  Medications Ordered in ED Medications  predniSONE (DELTASONE) tablet 60 mg (not administered)     Initial Impression / Assessment and Plan / ED Course  I have reviewed the triage vital signs and the nursing notes.  Pertinent labs & imaging  results that were available during my care of the patient were reviewed by me and considered in my medical decision making (see chart for details).     Patient with back pain.  No neurological deficits and normal neuro exam.  Patient is ambulatory.  No loss of bowel or bladder control.  Doubt cauda equina.  Denies fever,  doubt epidural abscess or other lesion. Recommend back exercises, stretching, RICE, and will treat with a short course of prednisone.  Encouraged the patient that there could be a need for additional workup and/or imaging such as MRI, if the symptoms do not resolve. Patient advised that if the back pain does not resolve, or radiates, this could progress to more serious conditions and is encouraged to follow-up with PCP or orthopedics within 2 weeks.     Final Clinical Impressions(s) / ED  Diagnoses   Final diagnoses:  Sciatica of right side    New Prescriptions New Prescriptions   CYCLOBENZAPRINE (FLEXERIL) 10 MG TABLET    Take 1 tablet (10 mg total) by mouth 2 (two) times daily as needed for muscle spasms.   PREDNISONE (DELTASONE) 20 MG TABLET    Take 2 tablets (40 mg total) by mouth daily. Take 40 mg by mouth daily for 3 days, then 20mg  by mouth daily for 3 days, then 10mg  daily for 3 days     Roxy Horseman, Cordelia Poche 02/19/17 0102    Loren Racer, MD 02/21/17 [phone]

## 2017-04-04 ENCOUNTER — Ambulatory Visit: Payer: Self-pay | Admitting: Family Medicine

## 2017-04-04 ENCOUNTER — Encounter: Payer: Self-pay | Admitting: Student in an Organized Health Care Education/Training Program

## 2017-04-04 ENCOUNTER — Ambulatory Visit (INDEPENDENT_AMBULATORY_CARE_PROVIDER_SITE_OTHER): Payer: Medicaid Other | Admitting: Student in an Organized Health Care Education/Training Program

## 2017-04-04 VITALS — BP 140/80 | HR 85 | Temp 96.7°F | Ht 67.0 in | Wt 305.4 lb

## 2017-04-04 DIAGNOSIS — N926 Irregular menstruation, unspecified: Secondary | ICD-10-CM | POA: Diagnosis not present

## 2017-04-04 DIAGNOSIS — Z3201 Encounter for pregnancy test, result positive: Secondary | ICD-10-CM

## 2017-04-04 DIAGNOSIS — A599 Trichomoniasis, unspecified: Secondary | ICD-10-CM | POA: Diagnosis not present

## 2017-04-04 DIAGNOSIS — Z3491 Encounter for supervision of normal pregnancy, unspecified, first trimester: Secondary | ICD-10-CM

## 2017-04-04 DIAGNOSIS — O0990 Supervision of high risk pregnancy, unspecified, unspecified trimester: Secondary | ICD-10-CM | POA: Insufficient documentation

## 2017-04-04 LAB — OB RESULTS CONSOLE GBS: STREP GROUP B AG: POSITIVE

## 2017-04-04 LAB — POCT UA - MICROSCOPIC ONLY: Trichomonas, UA: POSITIVE

## 2017-04-04 LAB — POCT URINALYSIS DIP (MANUAL ENTRY)
Bilirubin, UA: NEGATIVE
Blood, UA: NEGATIVE
Glucose, UA: NEGATIVE mg/dL
Ketones, POC UA: NEGATIVE mg/dL
Nitrite, UA: NEGATIVE
PH UA: 6 (ref 5.0–8.0)
UROBILINOGEN UA: 0.2 U/dL

## 2017-04-04 LAB — POCT URINE PREGNANCY: Preg Test, Ur: POSITIVE — AB

## 2017-04-04 MED ORDER — PRENATAL + COMPLETE MULTI 0.267 & 373 MG PO THPK: 1.0000 | PACK | Freq: Every day | ORAL | 2 refills | Status: DC

## 2017-04-04 MED ORDER — METRONIDAZOLE 500 MG PO TABS: 2000.0000 mg | ORAL_TABLET | Freq: Once | ORAL | 0 refills | Status: AC

## 2017-04-04 NOTE — Progress Notes (Signed)
   CC: amennorhea  HPI: Shirley [name] is a 26 y.o. female with who presents to [facility] today with amenorrhea since August 18 and 3 positive pregnancy test at home.  Pregnancy test was obtained in this visit and found to be positive. Patient states she has not been taking a prenatal vitamin. She does not drink but she does smoke cigarettes.  No abdominal pain, no bleeding, no nausea/vomiting/diarrhea/constipation. No urinary symptoms. No vaginal symptoms.  Review of Symptoms:  See HPI for ROS.   CC, SH/smoking status, and VS noted.  Objective: BP 140/80 (BP Location: Left Wrist, Patient Position: Sitting, Cuff Size: Normal)   Pulse 85   Temp (!) 96.7 F (35.9 C) (Axillary)   Ht  (1.702 m)   Wt (!) 305 lb 6.4 oz (138.5 kg)   LMP 02/15/2017   SpO2 98%   BMI 47.83 kg/m  GEN: NAD, alert, cooperative, and pleasant. CARD: Rate is regular PULM: comfortable work of breathing SKIN: warm and dry, no rashes or lesions NEURO: II-XII grossly intact PSYCH: AAOx3, appropriate affect  Assessment and plan:  Pregnant and not yet delivered in first trimester Newly diagnosed this visit. Briefly discussed do's and don'ts of pregnancy including no alcohol, no tobacco. Recommended starting prenatal vitamin which was ordered. Initial OB labs were ordered at today's visit. Patient to schedule OB visit with her PCP as she leaves the office today.  Trichomoniasis Patient found to have Trichomonas when urine was spun down on initial prenatal labs. She was taken back to a exam room to discuss these results. - She states she has no history of trach in the past - We'll treat with 2 g metronidazole one-time dose - dose reviewed with Dr. Lum Babe and is appropriate for pregnancy - Recommend that her partner is also tested and treated - Recommend that she abstains from sexual activity until both have been treated - Patient did become tearful when we were discussing this - Will need test of cure at  follow-up    Orders Placed This Encounter  Procedures  . Culture, OB Urine  . Sickle cell screen  . Obstetric Panel, Including HIV  . POCT urine pregnancy  . POCT urinalysis dipstick  . POCT UA - Microscopic Only    Meds ordered this encounter  Medications  . Prenat-Methylfol-Chol-Fish Oil (PRENATAL + COMPLETE MULTI) 0.267 & 373 MG THPK    Sig: Take 1 tablet by mouth daily.    Dispense:  120 each    Refill:  2  . metroNIDAZOLE (FLAGYL) 500 MG tablet    Sig: Take 4 tablets (2,000 mg total) by mouth once.    Dispense:  4 tablet    Refill:  0     Howard Pouch, MD,MS,  PGY2 04/04/2017 4:46 PM

## 2017-04-04 NOTE — Assessment & Plan Note (Signed)
Newly diagnosed this visit. Briefly discussed do's and don'ts of pregnancy including no alcohol, no tobacco. Recommended starting prenatal vitamin which was ordered. Initial OB labs were ordered at today's visit. Patient to schedule OB visit with her PCP as she leaves the office today.

## 2017-04-04 NOTE — Assessment & Plan Note (Signed)
Patient found to have Trichomonas when urine was spun down on initial prenatal labs. She was taken back to a exam room to discuss these results. - She states she has no history of trach in the past - We'll treat with 2 g metronidazole one-time dose - dose reviewed with Dr. Lum Babe and is appropriate for pregnancy - Recommend that her partner is also tested and treated - Recommend that she abstains from sexual activity until both have been treated - Patient did become tearful when we were discussing this - Will need test of cure at follow-up

## 2017-04-04 NOTE — Patient Instructions (Signed)
It was a pleasure seeing you today in our clinic. Today we discussed your new pregnancy! Congratulations. Blood work was collected in today's visit.   - please schedule your first prenatal visit as you leave the office today  Our clinic's number is [phone]. Please call with questions or concerns about what we discussed today.  Be well, Dr. Mosetta Putt   First Trimester of Pregnancy The first trimester of pregnancy is from week 1 until the end of week 13 (months 1 through 3). A week after a sperm fertilizes an egg, the egg will implant on the wall of the uterus. This embryo will begin to develop into a baby. Genes from you and your partner will form the baby. The female genes will determine whether the baby will be a boy or a girl. At 6-8 weeks, the eyes and face will be formed, and the heartbeat can be seen on ultrasound. At the end of 12 weeks, all the baby's organs will be formed. Now that you are pregnant, you will want to do everything you can to have a healthy baby. Two of the most important things are to get good prenatal care and to follow your health care provider's instructions. Prenatal care is all the medical care you receive before the baby's birth. This care will help prevent, find, and treat any problems during the pregnancy and childbirth. Body changes during your first trimester Your body goes through many changes during pregnancy. The changes vary from woman to woman.  You may gain or lose a couple of pounds at first.  You may feel sick to your stomach (nauseous) and you may throw up (vomit). If the vomiting is uncontrollable, call your health care provider.  You may tire easily.  You may develop headaches that can be relieved by medicines. All medicines should be approved by your health care provider.  You may urinate more often. Painful urination may mean you have a bladder infection.  You may develop heartburn as a result of your pregnancy.  You may develop constipation  because certain hormones are causing the muscles that push stool through your intestines to slow down.  You may develop hemorrhoids or swollen veins (varicose veins).  Your breasts may begin to grow larger and become tender. Your nipples may stick out more, and the tissue that surrounds them (areola) may become darker.  Your gums may bleed and may be sensitive to brushing and flossing.  Dark spots or blotches (chloasma, mask of pregnancy) may develop on your face. This will likely fade after the baby is born.  Your menstrual periods will stop.  You may have a loss of appetite.  You may develop cravings for certain kinds of food.  You may have changes in your emotions from day to day, such as being excited to be pregnant or being concerned that something may go wrong with the pregnancy and baby.  You may have more vivid and strange dreams.  You may have changes in your hair. These can include thickening of your hair, rapid growth, and changes in texture. Some women also have hair loss during or after pregnancy, or hair that feels dry or thin. Your hair will most likely return to normal after your baby is born.  What to expect at prenatal visits During a routine prenatal visit:  You will be weighed to make sure you and the baby are growing normally.  Your blood pressure will be taken.  Your abdomen will be measured to track your baby's  growth.  The fetal heartbeat will be listened to between weeks 10 and 14 of your pregnancy.  Test results from any previous visits will be discussed.  Your health care provider may ask you:  How you are feeling.  If you are feeling the baby move.  If you have had any abnormal symptoms, such as leaking fluid, bleeding, severe headaches, or abdominal cramping.  If you are using any tobacco products, including cigarettes, chewing tobacco, and electronic cigarettes.  If you have any questions.  Other tests that may be performed during your  first trimester include:  Blood tests to find your blood type and to check for the presence of any previous infections. The tests will also be used to check for low iron levels (anemia) and protein on red blood cells (Rh antibodies). Depending on your risk factors, or if you previously had diabetes during pregnancy, you may have tests to check for high blood sugar that affects pregnant women (gestational diabetes).  Urine tests to check for infections, diabetes, or protein in the urine.  An ultrasound to confirm the proper growth and development of the baby.  Fetal screens for spinal cord problems (spina bifida) and Down syndrome.  HIV (human immunodeficiency virus) testing. Routine prenatal testing includes screening for HIV, unless you choose not to have this test.  You may need other tests to make sure you and the baby are doing well.  Follow these instructions at home: Medicines  Follow your health care provider's instructions regarding medicine use. Specific medicines may be either safe or unsafe to take during pregnancy.  Take a prenatal vitamin that contains at least 600 micrograms (mcg) of folic acid.  If you develop constipation, try taking a stool softener if your health care provider approves. Eating and drinking  Eat a balanced diet that includes fresh fruits and vegetables, whole grains, good sources of protein such as meat, eggs, or tofu, and low-fat dairy. Your health care provider will help you determine the amount of weight gain that is right for you.  Avoid raw meat and uncooked cheese. These carry germs that can cause birth defects in the baby.  Eating four or five small meals rather than three large meals a day may help relieve nausea and vomiting. If you start to feel nauseous, eating a few soda crackers can be helpful. Drinking liquids between meals, instead of during meals, also seems to help ease nausea and vomiting.  Limit foods that are high in fat and  processed sugars, such as fried and sweet foods.  To prevent constipation: ? Eat foods that are high in fiber, such as fresh fruits and vegetables, whole grains, and beans. ? Drink enough fluid to keep your urine clear or pale yellow. Activity  Exercise only as directed by your health care provider. Most women can continue their usual exercise routine during pregnancy. Try to exercise for 30 minutes at least 5 days a week. Exercising will help you: ? Control your weight. ? Stay in shape. ? Be prepared for labor and delivery.  Experiencing pain or cramping in the lower abdomen or lower back is a good sign that you should stop exercising. Check with your health care provider before continuing with normal exercises.  Try to avoid standing for long periods of time. Move your legs often if you must stand in one place for a long time.  Avoid heavy lifting.  Wear low-heeled shoes and practice good posture.  You may continue to have sex unless your  health care provider tells you not to. Relieving pain and discomfort  Wear a good support bra to relieve breast tenderness.  Take warm sitz baths to soothe any pain or discomfort caused by hemorrhoids. Use hemorrhoid cream if your health care provider approves.  Rest with your legs elevated if you have leg cramps or low back pain.  If you develop varicose veins in your legs, wear support hose. Elevate your feet for 15 minutes, 3-4 times a day. Limit salt in your diet. Prenatal care  Schedule your prenatal visits by the twelfth week of pregnancy. They are usually scheduled monthly at first, then more often in the last 2 months before delivery.  Write down your questions. Take them to your prenatal visits.  Keep all your prenatal visits as told by your health care provider. This is important. Safety  Wear your seat belt at all times when driving.  Make a list of emergency phone numbers, including numbers for family, friends, the hospital,  and police and fire departments. General instructions  Ask your health care provider for a referral to a local prenatal education class. Begin classes no later than the beginning of month 6 of your pregnancy.  Ask for help if you have counseling or nutritional needs during pregnancy. Your health care provider can offer advice or refer you to specialists for help with various needs.  Do not use hot tubs, steam rooms, or saunas.  Do not douche or use tampons or scented sanitary pads.  Do not cross your legs for long periods of time.  Avoid cat litter boxes and soil used by cats. These carry germs that can cause birth defects in the baby and possibly loss of the fetus by miscarriage or stillbirth.  Avoid all smoking, herbs, alcohol, and medicines not prescribed by your health care provider. Chemicals in these products affect the formation and growth of the baby.  Do not use any products that contain nicotine or tobacco, such as cigarettes and e-cigarettes. If you need help quitting, ask your health care provider. You may receive counseling support and other resources to help you quit.  Schedule a dentist appointment. At home, brush your teeth with a soft toothbrush and be gentle when you floss. Contact a health care provider if:  You have dizziness.  You have mild pelvic cramps, pelvic pressure, or nagging pain in the abdominal area.  You have persistent nausea, vomiting, or diarrhea.  You have a bad smelling vaginal discharge.  You have pain when you urinate.  You notice increased swelling in your face, hands, legs, or ankles.  You are exposed to fifth disease or chickenpox.  You are exposed to [place] measles (rubella) and have never had it. Get help right away if:  You have a fever.  You are leaking fluid from your vagina.  You have spotting or bleeding from your vagina.  You have severe abdominal cramping or pain.  You have rapid weight gain or loss.  You vomit blood  or material that looks like coffee grounds.  You develop a severe headache.  You have shortness of breath.  You have any kind of trauma, such as from a fall or a car accident. Summary  The first trimester of pregnancy is from week 1 until the end of week 13 (months 1 through 3).  Your body goes through many changes during pregnancy. The changes vary from woman to woman.  You will have routine prenatal visits. During those visits, your health care provider will  examine you, discuss any test results you may have, and talk with you about how you are feeling. This information is not intended to replace advice given to you by your health care provider. Make sure you discuss any questions you have with your health care provider. Document Released: 06/11/2001 Document Revised: 05/29/2016 Document Reviewed: 05/29/2016 Elsevier Interactive Patient Education  2017 [employer].

## 2017-04-05 LAB — OBSTETRIC PANEL, INCLUDING HIV
Antibody Screen: NEGATIVE
BASOS ABS: 0 10*3/uL (ref 0.0–0.2)
Basos: 0 %
EOS (ABSOLUTE): 0.1 10*3/uL (ref 0.0–0.4)
Eos: 1 %
HEP B S AG: NEGATIVE
HIV Screen 4th Generation wRfx: NONREACTIVE
Hematocrit: 34.3 % (ref 34.0–46.6)
Hemoglobin: 11.1 g/dL (ref 11.1–15.9)
IMMATURE GRANS (ABS): 0 10*3/uL (ref 0.0–0.1)
IMMATURE GRANULOCYTES: 0 %
LYMPHS: 24 %
Lymphocytes Absolute: 2.4 10*3/uL (ref 0.7–3.1)
MCH: 25.9 pg — ABNORMAL LOW (ref 26.6–33.0)
MCHC: 32.4 g/dL (ref 31.5–35.7)
MCV: 80 fL (ref 79–97)
MONOCYTES: 5 %
Monocytes Absolute: 0.5 10*3/uL (ref 0.1–0.9)
NEUTROS ABS: 7.2 10*3/uL — AB (ref 1.4–7.0)
NEUTROS PCT: 70 %
PLATELETS: 271 10*3/uL (ref 150–379)
RBC: 4.29 x10E6/uL (ref 3.77–5.28)
RDW: 14.9 % (ref 12.3–15.4)
RPR: NONREACTIVE
RUBELLA: 1.45 {index} (ref >0.99)
Rh Factor: POSITIVE
WBC: 10.3 10*3/uL (ref 3.4–10.8)

## 2017-04-05 LAB — SICKLE CELL SCREEN: SICKLE CELL SCREEN: NEGATIVE

## 2017-04-07 NOTE — Progress Notes (Deleted)
   Subjective:   Patient ID: [name]    DOB: 1991/01/29, 26 y.o. female   MRN: [id number]  CC: "***"  HPI: SHIZA THELEN is a 26 y.o. female who presents to clinic today ***. Problems discussed today are as follows:  ***: *** ROS: ***  Complete ROS performed, see HPI for pertinent.  PMFSH: HTN, morbid obesity, OA knees b/l, sciatica neuralgia, spinal stenosis of lumbar w/ radiculopathy. Surgical history wisdom tooth extraction. Family history HTN. Smoking status reviewed. Medications reviewed.  Objective:   There were no vitals taken for this visit. Vitals and nursing note reviewed.  General: well nourished, well developed, in no acute distress with non-toxic appearance HEENT: normocephalic, atraumatic, moist mucous membranes Neck: supple, non-tender without lymphadenopathy CV: regular rate and rhythm without murmurs, rubs, or gallops, no lower extremity edema Lungs: clear to auscultation bilaterally with normal work of breathing Abdomen: soft, non-tender, non-distended, no masses or organomegaly palpable, normoactive bowel sounds Skin: warm, dry, no rashes or lesions, cap refill < 2 seconds Extremities: warm and well perfused, normal tone  Assessment & Plan:   No problem-specific Assessment & Plan notes found for this encounter.  No orders of the defined types were placed in this encounter.  No orders of the defined types were placed in this encounter.   Durward Parcel, DO [facility], PGY-2 04/07/2017 9:06 PM

## 2017-04-08 ENCOUNTER — Ambulatory Visit: Payer: Medicaid Other | Admitting: Family Medicine

## 2017-04-09 ENCOUNTER — Encounter: Payer: Self-pay | Admitting: Family Medicine

## 2017-04-09 ENCOUNTER — Ambulatory Visit (INDEPENDENT_AMBULATORY_CARE_PROVIDER_SITE_OTHER): Payer: Medicaid Other | Admitting: Family Medicine

## 2017-04-09 ENCOUNTER — Encounter: Payer: Self-pay | Admitting: *Deleted

## 2017-04-09 VITALS — BP 130/70 | HR 69 | Temp 98.0°F | Ht 67.0 in | Wt 300.6 lb

## 2017-04-09 DIAGNOSIS — O0991 Supervision of high risk pregnancy, unspecified, first trimester: Secondary | ICD-10-CM | POA: Diagnosis not present

## 2017-04-09 DIAGNOSIS — A599 Trichomoniasis, unspecified: Secondary | ICD-10-CM

## 2017-04-09 DIAGNOSIS — O219 Vomiting of pregnancy, unspecified: Secondary | ICD-10-CM

## 2017-04-09 DIAGNOSIS — Z3491 Encounter for supervision of normal pregnancy, unspecified, first trimester: Secondary | ICD-10-CM | POA: Diagnosis not present

## 2017-04-09 MED ORDER — ONDANSETRON HCL 4 MG PO TABS: 4.0000 mg | ORAL_TABLET | Freq: Three times a day (TID) | ORAL | 0 refills | Status: DC | PRN

## 2017-04-09 MED ORDER — METRONIDAZOLE 500 MG PO TABS: 2000.0000 mg | ORAL_TABLET | Freq: Once | ORAL | 0 refills | Status: AC

## 2017-04-09 NOTE — Patient Instructions (Addendum)
Thank you for coming in to see [place] today. Please see below to review our plan for today's visit.  1. Be sure to take all 4 of the antibiotics together at once. Take this with food. 2. The OBGYN will call you regarding your first OB appointment. 3. Take the Zofran for nausea and vomiting only when your symptoms are severe.  Please call the clinic at [phone] if your symptoms worsen or you have any concerns. It was my pleasure to see you. -- Durward Parcel, DO [facility], PGY-2

## 2017-04-09 NOTE — Assessment & Plan Note (Addendum)
Acute. Untreated. Patient is pregnant. --Refill 2 g metronidazole with instructions to take and avoid sexual activity until partner is treated too

## 2017-04-09 NOTE — Progress Notes (Signed)
Subjective:   Patient ID: Shirley [name]    DOB: 1990-07-19, 26 y.o. female   MRN: [id number]  CC: "Nasuea and vomiting"  HPI: Shirley [name] is a 26 y.o. female who presents to clinic today nausea and vomiting. Problems discussed today are as follows:  Nausea with vomiting: has been intermittent throughout the day for the past 5 days. Patient states "I have not been able to keep anything down for 5 days." She has however been able to keep down a few crackers and liquids in the clinic. She states she had similar issues with her previous pregnancy but "this is worse." ROS: Denies fevers or chills, abdominal pain, shortness of breath, diarrhea.  Trichomonas: patient states her mother "threw medications prescribed during her last visit out the window." She feels she would be able to keep down the tablets despite her nausea and vomiting.  Pregnancy: patient was told to follow-up after being seen following positive pregnancy test. She received her initial prenatal labs. This pregnancy was unplanned, however she plans to move forward with the pregnancy and keep the child. ROS: Denies vaginal bleeding, vaginal leaking, or contractions.  Complete ROS performed, see HPI for pertinent.  PMFSH: HTN, morbid obesity, OA knees b/l, sciatica neuralgia, spinal stenosis of lumbar w/ radiculopathy. Surgical history wisdom tooth extraction. Family history HTN. Smoking status reviewed. Medications reviewed.  Objective:   BP 130/70 (BP Location: Left Arm, Patient Position: Sitting, Cuff Size: Large)   Pulse 69   Temp 98 F (36.7 C) (Oral)   Ht  (1.702 m)   Wt (!) 300 lb 9.6 oz (136.4 kg)   LMP 02/15/2017 (Exact Date)   SpO2 99%   BMI 47.08 kg/m  Vitals and nursing note reviewed.  General: obese, in mod acute distress due to nausea with non-toxic appearance HEENT: normocephalic, atraumatic, moist mucous membranes Neck: supple, non-tender without lymphadenopathy CV: regular rate and rhythm  without murmurs, rubs, or gallops, no lower extremity edema Lungs: clear to auscultation bilaterally with normal work of breathing Abdomen: soft, non-tender, non-distended, no masses or organomegaly palpable, normoactive bowel sounds Skin: warm, dry, no rashes or lesions, cap refill < 2 seconds Extremities: warm and well perfused, normal tone  Assessment & Plan:   Nausea and vomiting during pregnancy Acute. Problematic for patient with weight down 5 pounds, though she is morbidly obese and has weights at 300 lbs since last year. --Given prescription for Zofran #20 tabs, no refills  High-risk pregnancy Acute. GA [redacted]w[redacted]d by LMP which patient is certain. Has poorly controlled HTN. Unplanned pregnancy though patient wants to keep baby. Taking prenatal vitamin. --Amb referral to OBGYN for high risk OB  Trichomoniasis Acute. Untreated. Patient is pregnant. --Refill 2 g metronidazole with instructions to take and avoid sexual activity until partner is treated too  Orders Placed This Encounter  Procedures  . Ambulatory referral to Obstetrics / Gynecology    Referral Priority:   Routine    Referral Type:   Consultation    Referral Reason:   Specialty Services Required    Requested Specialty:   Obstetrics and Gynecology    Number of Visits Requested:   1   Meds ordered this encounter  Medications  . metroNIDAZOLE (FLAGYL) 500 MG tablet    Sig: Take 4 tablets (2,000 mg total) by mouth once.    Dispense:  4 tablet    Refill:  0  . ondansetron (ZOFRAN) 4 MG tablet    Sig: Take 1 tablet (4 mg total)  by mouth every 8 (eight) hours as needed for nausea or vomiting.    Dispense:  20 tablet    Refill:  0    Durward Parcel, DO [facility], PGY-2 04/09/2017 5:23 PM

## 2017-04-09 NOTE — Assessment & Plan Note (Addendum)
Acute. GA [redacted]w[redacted]d by LMP which patient is certain. Has poorly controlled HTN. Unplanned pregnancy though patient wants to keep baby. Taking prenatal vitamin. --Amb referral to OBGYN for high risk OB

## 2017-04-09 NOTE — Assessment & Plan Note (Addendum)
Acute. Problematic for patient with weight down 5 pounds, though she is morbidly obese and has weights at 300 lbs since last year. --Given prescription for Zofran #20 tabs, no refills

## 2017-04-13 LAB — URINE CULTURE, OB REFLEX

## 2017-04-13 LAB — CULTURE, OB URINE

## 2017-04-28 ENCOUNTER — Telehealth: Payer: Self-pay | Admitting: Family Medicine

## 2017-05-07 ENCOUNTER — Other Ambulatory Visit (HOSPITAL_COMMUNITY)
Admission: RE | Admit: 2017-05-07 | Discharge: 2017-05-07 | Disposition: A | Discharge status: Home / Self Care | Payer: Medicaid Other | Source: Ambulatory Visit | Attending: Obstetrics and Gynecology | Admitting: Obstetrics and Gynecology

## 2017-05-07 ENCOUNTER — Encounter: Payer: Self-pay | Admitting: Obstetrics and Gynecology

## 2017-05-07 ENCOUNTER — Ambulatory Visit (INDEPENDENT_AMBULATORY_CARE_PROVIDER_SITE_OTHER): Payer: Medicaid Other | Admitting: Obstetrics and Gynecology

## 2017-05-07 VITALS — BP 160/118 | HR 91 | Wt 295.6 lb

## 2017-05-07 DIAGNOSIS — O10919 Unspecified pre-existing hypertension complicating pregnancy, unspecified trimester: Secondary | ICD-10-CM | POA: Insufficient documentation

## 2017-05-07 DIAGNOSIS — O10911 Unspecified pre-existing hypertension complicating pregnancy, first trimester: Secondary | ICD-10-CM

## 2017-05-07 DIAGNOSIS — O0991 Supervision of high risk pregnancy, unspecified, first trimester: Secondary | ICD-10-CM | POA: Diagnosis not present

## 2017-05-07 DIAGNOSIS — E669 Obesity, unspecified: Secondary | ICD-10-CM | POA: Diagnosis not present

## 2017-05-07 DIAGNOSIS — Z3A00 Weeks of gestation of pregnancy not specified: Secondary | ICD-10-CM | POA: Insufficient documentation

## 2017-05-07 DIAGNOSIS — Z1151 Encounter for screening for human papillomavirus (HPV): Secondary | ICD-10-CM

## 2017-05-07 DIAGNOSIS — O0990 Supervision of high risk pregnancy, unspecified, unspecified trimester: Secondary | ICD-10-CM

## 2017-05-07 DIAGNOSIS — O99211 Obesity complicating pregnancy, first trimester: Secondary | ICD-10-CM

## 2017-05-07 DIAGNOSIS — O99210 Obesity complicating pregnancy, unspecified trimester: Secondary | ICD-10-CM

## 2017-05-07 DIAGNOSIS — Z124 Encounter for screening for malignant neoplasm of cervix: Secondary | ICD-10-CM | POA: Diagnosis not present

## 2017-05-07 LAB — POCT URINALYSIS DIP (DEVICE)
Glucose, UA: NEGATIVE mg/dL
HGB URINE DIPSTICK: NEGATIVE
Nitrite: NEGATIVE
PH: 6.5 (ref 5.0–8.0)
Protein, ur: NEGATIVE mg/dL
SPECIFIC GRAVITY, URINE: 1.025 (ref 1.005–1.030)
Urobilinogen, UA: 2 mg/dL — ABNORMAL HIGH (ref 0.0–1.0)

## 2017-05-07 MED ORDER — LABETALOL HCL 200 MG PO TABS: 200.0000 mg | ORAL_TABLET | Freq: Two times a day (BID) | ORAL | 3 refills | Status: DC

## 2017-05-07 MED ORDER — ASPIRIN EC 81 MG PO TBEC: 81.0000 mg | DELAYED_RELEASE_TABLET | Freq: Every day | ORAL | 2 refills | Status: DC

## 2017-05-07 NOTE — Progress Notes (Signed)
New ob packet given  First Trimester Screen scheduled for November 16th @1330 .  Anatomy US scheduled for December 26th @ 0830.

## 2017-05-07 NOTE — Progress Notes (Signed)
  Subjective:    Shirley [name] is a [id number] being seen today for her first obstetrical visit.  [facility]. Patient does intend to breast feed. Pregnancy history fully reviewed.  Patient reports nausea.  Vitals:   05/07/17 1338 05/07/17 1339  BP: (!) 153/109 (!) 160/118  Pulse: 91   Weight: 295 lb 9.6 oz (134.1 kg)     HISTORY: OB History  Gravida Para Term Preterm AB Living  4 2 2   1 2   SAB TAB Ectopic Multiple Live Births    1     2    # Outcome Date GA Lbr Len/2nd Weight Sex Delivery Anes PTL Lv  4 Current           3 TAB 08/01/09          2 Term 05/19/09 1445w0d  6 lb 2 oz (2.778 kg) F Vag-Spont EPI N LIV  1 Term 07/31/05 4354w0d  6 lb 4 oz (2.835 kg) F Vag-Spont EPI N LIV     Past Medical History:  Diagnosis Date  . Arthritis   . Hypertension   . No significant past medical history   . Obesity   . Sciatica    History reviewed. No pertinent surgical history. Family History  Problem Relation Age of Onset  . Hypertension Mother   . Diabetes Maternal Grandmother   . Diabetes Paternal Grandmother   . Stroke Paternal Grandmother      Exam    Uterus:   12-weeks  Pelvic Exam:    Perineum: Normal Perineum   Vulva: normal   Vagina:  normal mucosa, normal discharge   pH:    Cervix: multiparous appearance   Adnexa: normal adnexa and no mass, fullness, tenderness   Bony Pelvis: gynecoid  System: Breast:  normal appearance, no masses or tenderness   Skin: normal coloration and turgor, no rashes    Neurologic: oriented, no focal deficits   Extremities: normal strength, tone, and muscle mass   HEENT extra ocular movement intact   Mouth/Teeth mucous membranes moist, pharynx normal without lesions and dental hygiene good   Neck supple and no masses   Cardiovascular: regular rate and rhythm   Respiratory:  chest clear, no wheezing, crepitations, rhonchi, normal symmetric air entry   Abdomen: soft, non-tender; bowel sounds  normal; no masses,  no organomegaly obese   Urinary:       Assessment:    Pregnancy: [id number] Patient Active Problem List   Diagnosis Date Noted  . Chronic hypertension during pregnancy, antepartum 05/07/2017  . Obesity complicating pregnancy 05/07/2017  . Supervision of high risk pregnancy, antepartum 04/04/2017  . Frequent No-show for appointment 12/06/2016  . Osteoarthritis of both knees 09/29/2015  . 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  . Low back pain with right-sided sciatica 09/15/2015  . Submandibular abscess 10/18/2013  . Obesity, Class III, BMI 40-49.9 (morbid obesity) (HCC) 12/02/2006  . Primary hypertension 08/28/2006        Plan:     Initial labs drawn. Prenatal vitamins. Problem list reviewed and updated. Genetic Screening discussed First Screen: ordered.  Ultrasound discussed; fetal survey: ordered. Rx labetalol 200 mg BID provided Rx ASA to start this weekend  Follow up in 4 weeks. 50% of 30 min visit spent on counseling and coordination of care.     Aquarius Latouche 05/07/2017

## 2017-05-07 NOTE — Patient Instructions (Signed)
 First Trimester of Pregnancy The first trimester of pregnancy is from week 1 until the end of week 13 (months 1 through 3). A week after a sperm fertilizes an egg, the egg will implant on the wall of the uterus. This embryo will begin to develop into a baby. Genes from you and your partner will form the baby. The female genes will determine whether the baby will be a boy or a girl. At 6-8 weeks, the eyes and face will be formed, and the heartbeat can be seen on ultrasound. At the end of 12 weeks, all the baby's organs will be formed. Now that you are pregnant, you will want to do everything you can to have a healthy baby. Two of the most important things are to get good prenatal care and to follow your health care provider's instructions. Prenatal care is all the medical care you receive before the baby's birth. This care will help prevent, find, and treat any problems during the pregnancy and childbirth. Body changes during your first trimester Your body goes through many changes during pregnancy. The changes vary from woman to woman.  You may gain or lose a couple of pounds at first.  You may feel sick to your stomach (nauseous) and you may throw up (vomit). If the vomiting is uncontrollable, call your health care provider.  You may tire easily.  You may develop headaches that can be relieved by medicines. All medicines should be approved by your health care provider.  You may urinate more often. Painful urination may mean you have a bladder infection.  You may develop heartburn as a result of your pregnancy.  You may develop constipation because certain hormones are causing the muscles that push stool through your intestines to slow down.  You may develop hemorrhoids or swollen veins (varicose veins).  Your breasts may begin to grow larger and become tender. Your nipples may stick out more, and the tissue that surrounds them (areola) may become darker.  Your gums may bleed and may be  sensitive to brushing and flossing.  Dark spots or blotches (chloasma, mask of pregnancy) may develop on your face. This will likely fade after the baby is born.  Your menstrual periods will stop.  You may have a loss of appetite.  You may develop cravings for certain kinds of food.  You may have changes in your emotions from day to day, such as being excited to be pregnant or being concerned that something may go wrong with the pregnancy and baby.  You may have more vivid and strange dreams.  You may have changes in your hair. These can include thickening of your hair, rapid growth, and changes in texture. Some women also have hair loss during or after pregnancy, or hair that feels dry or thin. Your hair will most likely return to normal after your baby is born.  What to expect at prenatal visits During a routine prenatal visit:  You will be weighed to make sure you and the baby are growing normally.  Your blood pressure will be taken.  Your abdomen will be measured to track your baby's growth.  The fetal heartbeat will be listened to between weeks 10 and 14 of your pregnancy.  Test results from any previous visits will be discussed.  Your health care provider may ask you:  How you are feeling.  If you are feeling the baby move.  If you have had any abnormal symptoms, such as leaking fluid, bleeding, severe   headaches, or abdominal cramping.  If you are using any tobacco products, including cigarettes, chewing tobacco, and electronic cigarettes.  If you have any questions.  Other tests that may be performed during your first trimester include:  Blood tests to find your blood type and to check for the presence of any previous infections. The tests will also be used to check for low iron levels (anemia) and protein on red blood cells (Rh antibodies). Depending on your risk factors, or if you previously had diabetes during pregnancy, you may have tests to check for high blood  sugar that affects pregnant women (gestational diabetes).  Urine tests to check for infections, diabetes, or protein in the urine.  An ultrasound to confirm the proper growth and development of the baby.  Fetal screens for spinal cord problems (spina bifida) and Down syndrome.  HIV (human immunodeficiency virus) testing. Routine prenatal testing includes screening for HIV, unless you choose not to have this test.  You may need other tests to make sure you and the baby are doing well.  Follow these instructions at home: Medicines  Follow your health care provider's instructions regarding medicine use. Specific medicines may be either safe or unsafe to take during pregnancy.  Take a prenatal vitamin that contains at least 600 micrograms (mcg) of folic acid.  If you develop constipation, try taking a stool softener if your health care provider approves. Eating and drinking  Eat a balanced diet that includes fresh fruits and vegetables, whole grains, good sources of protein such as meat, eggs, or tofu, and low-fat dairy. Your health care provider will help you determine the amount of weight gain that is right for you.  Avoid raw meat and uncooked cheese. These carry germs that can cause birth defects in the baby.  Eating four or five small meals rather than three large meals a day may help relieve nausea and vomiting. If you start to feel nauseous, eating a few soda crackers can be helpful. Drinking liquids between meals, instead of during meals, also seems to help ease nausea and vomiting.  Limit foods that are high in fat and processed sugars, such as fried and sweet foods.  To prevent constipation: ? Eat foods that are high in fiber, such as fresh fruits and vegetables, whole grains, and beans. ? Drink enough fluid to keep your urine clear or pale yellow. Activity  Exercise only as directed by your health care provider. Most women can continue their usual exercise routine during  pregnancy. Try to exercise for 30 minutes at least 5 days a week. Exercising will help you: ? Control your weight. ? Stay in shape. ? Be prepared for labor and delivery.  Experiencing pain or cramping in the lower abdomen or lower back is a good sign that you should stop exercising. Check with your health care provider before continuing with normal exercises.  Try to avoid standing for long periods of time. Move your legs often if you must stand in one place for a long time.  Avoid heavy lifting.  Wear low-heeled shoes and practice good posture.  You may continue to have sex unless your health care provider tells you not to. Relieving pain and discomfort  Wear a good support bra to relieve breast tenderness.  Take warm sitz baths to soothe any pain or discomfort caused by hemorrhoids. Use hemorrhoid cream if your health care provider approves.  Rest with your legs elevated if you have leg cramps or low back pain.  If you   develop varicose veins in your legs, wear support hose. Elevate your feet for 15 minutes, 3-4 times a day. Limit salt in your diet. Prenatal care  Schedule your prenatal visits by the twelfth week of pregnancy. They are usually scheduled monthly at first, then more often in the last 2 months before delivery.  Write down your questions. Take them to your prenatal visits.  Keep all your prenatal visits as told by your health care provider. This is important. Safety  Wear your seat belt at all times when driving.  Make a list of emergency phone numbers, including numbers for family, friends, the hospital, and police and fire departments. General instructions  Ask your health care provider for a referral to a local prenatal education class. Begin classes no later than the beginning of month 6 of your pregnancy.  Ask for help if you have counseling or nutritional needs during pregnancy. Your health care provider can offer advice or refer you to specialists for help  with various needs.  Do not use hot tubs, steam rooms, or saunas.  Do not douche or use tampons or scented sanitary pads.  Do not cross your legs for long periods of time.  Avoid cat litter boxes and soil used by cats. These carry germs that can cause birth defects in the baby and possibly loss of the fetus by miscarriage or stillbirth.  Avoid all smoking, herbs, alcohol, and medicines not prescribed by your health care provider. Chemicals in these products affect the formation and growth of the baby.  Do not use any products that contain nicotine or tobacco, such as cigarettes and e-cigarettes. If you need help quitting, ask your health care provider. You may receive counseling support and other resources to help you quit.  Schedule a dentist appointment. At home, brush your teeth with a soft toothbrush and be gentle when you floss. Contact a health care provider if:  You have dizziness.  You have mild pelvic cramps, pelvic pressure, or nagging pain in the abdominal area.  You have persistent nausea, vomiting, or diarrhea.  You have a bad smelling vaginal discharge.  You have pain when you urinate.  You notice increased swelling in your face, hands, legs, or ankles.  You are exposed to fifth disease or chickenpox.  You are exposed to German measles (rubella) and have never had it. Get help right away if:  You have a fever.  You are leaking fluid from your vagina.  You have spotting or bleeding from your vagina.  You have severe abdominal cramping or pain.  You have rapid weight gain or loss.  You vomit blood or material that looks like coffee grounds.  You develop a severe headache.  You have shortness of breath.  You have any kind of trauma, such as from a fall or a car accident. Summary  The first trimester of pregnancy is from week 1 until the end of week 13 (months 1 through 3).  Your body goes through many changes during pregnancy. The changes vary from  woman to woman.  You will have routine prenatal visits. During those visits, your health care provider will examine you, discuss any test results you may have, and talk with you about how you are feeling. This information is not intended to replace advice given to you by your health care provider. Make sure you discuss any questions you have with your health care provider. Document Released: 06/11/2001 Document Revised: 05/29/2016 Document Reviewed: 05/29/2016 [employer] Interactive Patient Education  2017   [employer]   Second Trimester of Pregnancy The second trimester is from week 14 through week 27 (months 4 through 6). The second trimester is often a time when you feel your best. Your body has adjusted to being pregnant, and you begin to feel better physically. Usually, morning sickness has lessened or quit completely, you may have more energy, and you may have an increase in appetite. The second trimester is also a time when the fetus is growing rapidly. At the end of the sixth month, the fetus is about 9 inches long and weighs about 1 pounds. You will likely begin to feel the baby move (quickening) between 16 and 20 weeks of pregnancy. Body changes during your second trimester Your body continues to go through many changes during your second trimester. The changes vary from woman to woman.  Your weight will continue to increase. You will notice your lower abdomen bulging out.  You may begin to get stretch marks on your hips, abdomen, and breasts.  You may develop headaches that can be relieved by medicines. The medicines should be approved by your health care provider.  You may urinate more often because the fetus is pressing on your bladder.  You may develop or continue to have heartburn as a result of your pregnancy.  You may develop constipation because certain hormones are causing the muscles that push waste through your intestines to slow down.  You may develop hemorrhoids or  swollen, bulging veins (varicose veins).  You may have back pain. This is caused by: ? Weight gain. ? Pregnancy hormones that are relaxing the joints in your pelvis. ? A shift in weight and the muscles that support your balance.  Your breasts will continue to grow and they will continue to become tender.  Your gums may bleed and may be sensitive to brushing and flossing.  Dark spots or blotches (chloasma, mask of pregnancy) may develop on your face. This will likely fade after the baby is born.  A dark line from your belly button to the pubic area (linea nigra) may appear. This will likely fade after the baby is born.  You may have changes in your hair. These can include thickening of your hair, rapid growth, and changes in texture. Some women also have hair loss during or after pregnancy, or hair that feels dry or thin. Your hair will most likely return to normal after your baby is born.  What to expect at prenatal visits During a routine prenatal visit:  You will be weighed to make sure you and the fetus are growing normally.  Your blood pressure will be taken.  Your abdomen will be measured to track your baby's growth.  The fetal heartbeat will be listened to.  Any test results from the previous visit will be discussed.  Your health care provider may ask you:  How you are feeling.  If you are feeling the baby move.  If you have had any abnormal symptoms, such as leaking fluid, bleeding, severe headaches, or abdominal cramping.  If you are using any tobacco products, including cigarettes, chewing tobacco, and electronic cigarettes.  If you have any questions.  Other tests that may be performed during your second trimester include:  Blood tests that check for: ? Low iron levels (anemia). ? High blood sugar that affects pregnant women (gestational diabetes) between 24 and 28 weeks. ? Rh antibodies. This is to check for a protein on red blood cells (Rh factor).  Urine  tests to   check for infections, diabetes, or protein in the urine.  An ultrasound to confirm the proper growth and development of the baby.  An amniocentesis to check for possible genetic problems.  Fetal screens for spina bifida and Down syndrome.  HIV (human immunodeficiency virus) testing. Routine prenatal testing includes screening for HIV, unless you choose not to have this test.  Follow these instructions at home: Medicines  Follow your health care provider's instructions regarding medicine use. Specific medicines may be either safe or unsafe to take during pregnancy.  Take a prenatal vitamin that contains at least 600 micrograms (mcg) of folic acid.  If you develop constipation, try taking a stool softener if your health care provider approves. Eating and drinking  Eat a balanced diet that includes fresh fruits and vegetables, whole grains, good sources of protein such as meat, eggs, or tofu, and low-fat dairy. Your health care provider will help you determine the amount of weight gain that is right for you.  Avoid raw meat and uncooked cheese. These carry germs that can cause birth defects in the baby.  If you have low calcium intake from food, talk to your health care provider about whether you should take a daily calcium supplement.  Limit foods that are high in fat and processed sugars, such as fried and sweet foods.  To prevent constipation: ? Drink enough fluid to keep your urine clear or pale yellow. ? Eat foods that are high in fiber, such as fresh fruits and vegetables, whole grains, and beans. Activity  Exercise only as directed by your health care provider. Most women can continue their usual exercise routine during pregnancy. Try to exercise for 30 minutes at least 5 days a week. Stop exercising if you experience uterine contractions.  Avoid heavy lifting, wear low heel shoes, and practice good posture.  A sexual relationship may be continued unless your health  care provider directs you otherwise. Relieving pain and discomfort  Wear a good support bra to prevent discomfort from breast tenderness.  Take warm sitz baths to soothe any pain or discomfort caused by hemorrhoids. Use hemorrhoid cream if your health care provider approves.  Rest with your legs elevated if you have leg cramps or low back pain.  If you develop varicose veins, wear support hose. Elevate your feet for 15 minutes, 3-4 times a day. Limit salt in your diet. Prenatal Care  Write down your questions. Take them to your prenatal visits.  Keep all your prenatal visits as told by your health care provider. This is important. Safety  Wear your seat belt at all times when driving.  Make a list of emergency phone numbers, including numbers for family, friends, the hospital, and police and fire departments. General instructions  Ask your health care provider for a referral to a local prenatal education class. Begin classes no later than the beginning of month 6 of your pregnancy.  Ask for help if you have counseling or nutritional needs during pregnancy. Your health care provider can offer advice or refer you to specialists for help with various needs.  Do not use hot tubs, steam rooms, or saunas.  Do not douche or use tampons or scented sanitary pads.  Do not cross your legs for long periods of time.  Avoid cat litter boxes and soil used by cats. These carry germs that can cause birth defects in the baby and possibly loss of the fetus by miscarriage or stillbirth.  Avoid all smoking, herbs, alcohol, and unprescribed drugs. Chemicals   in these products can affect the formation and growth of the baby.  Do not use any products that contain nicotine or tobacco, such as cigarettes and e-cigarettes. If you need help quitting, ask your health care provider.  Visit your dentist if you have not gone yet during your pregnancy. Use a soft toothbrush to brush your teeth and be gentle when  you floss. Contact a health care provider if:  You have dizziness.  You have mild pelvic cramps, pelvic pressure, or nagging pain in the abdominal area.  You have persistent nausea, vomiting, or diarrhea.  You have a bad smelling vaginal discharge.  You have pain when you urinate. Get help right away if:  You have a fever.  You are leaking fluid from your vagina.  You have spotting or bleeding from your vagina.  You have severe abdominal cramping or pain.  You have rapid weight gain or weight loss.  You have shortness of breath with chest pain.  You notice sudden or extreme swelling of your face, hands, ankles, feet, or legs.  You have not felt your baby move in over an hour.  You have severe headaches that do not go away when you take medicine.  You have vision changes. Summary  The second trimester is from week 14 through week 27 (months 4 through 6). It is also a time when the fetus is growing rapidly.  Your body goes through many changes during pregnancy. The changes vary from woman to woman.  Avoid all smoking, herbs, alcohol, and unprescribed drugs. These chemicals affect the formation and growth your baby.  Do not use any tobacco products, such as cigarettes, chewing tobacco, and e-cigarettes. If you need help quitting, ask your health care provider.  Contact your health care provider if you have any questions. Keep all prenatal visits as told by your health care provider. This is important. This information is not intended to replace advice given to you by your health care provider. Make sure you discuss any questions you have with your health care provider. Document Released: 06/11/2001 Document Revised: 11/23/2015 Document Reviewed: 08/18/2012 [employer] Interactive Patient Education  2017 [employer]  Contraception Choices Contraception (birth control) is the use of any methods or devices to prevent pregnancy. Below are some methods to help avoid  pregnancy. Hormonal methods  Contraceptive implant. This is a thin, plastic tube containing progesterone hormone. It does not contain estrogen hormone. Your health care provider inserts the tube in the inner part of the upper arm. The tube can remain in place for up to 3 years. After 3 years, the implant must be removed. The implant prevents the ovaries from releasing an egg (ovulation), thickens the cervical mucus to prevent sperm from entering the uterus, and thins the lining of the inside of the uterus.  Progesterone-only injections. These injections are given every 3 months by your health care provider to prevent pregnancy. This synthetic progesterone hormone stops the ovaries from releasing eggs. It also thickens cervical mucus and changes the uterine lining. This makes it harder for sperm to survive in the uterus.  Birth control pills. These pills contain estrogen and progesterone hormone. They work by preventing the ovaries from releasing eggs (ovulation). They also cause the cervical mucus to thicken, preventing the sperm from entering the uterus. Birth control pills are prescribed by a health care provider.Birth control pills can also be used to treat heavy periods.  Minipill. This type of birth control pill contains only the progesterone hormone. They are   taken every day of each month and must be prescribed by your health care provider.  Birth control patch. The patch contains hormones similar to those in birth control pills. It must be changed once a week and is prescribed by a health care provider.  Vaginal ring. The ring contains hormones similar to those in birth control pills. It is left in the vagina for 3 weeks, removed for 1 week, and then a new one is put back in place. The patient must be comfortable inserting and removing the ring from the vagina.A health care provider's prescription is necessary.  Emergency contraception. Emergency contraceptives prevent pregnancy after  unprotected sexual intercourse. This pill can be taken right after sex or up to 5 days after unprotected sex. It is most effective the sooner you take the pills after having sexual intercourse. Most emergency contraceptive pills are available without a prescription. Check with your pharmacist. Do not use emergency contraception as your only form of birth control. Barrier methods  Female condom. This is a thin sheath (latex or rubber) that is worn over the penis during sexual intercourse. It can be used with spermicide to increase effectiveness.  Female condom. This is a soft, loose-fitting sheath that is put into the vagina before sexual intercourse.  Diaphragm. This is a soft, latex, dome-shaped barrier that must be fitted by a health care provider. It is inserted into the vagina, along with a spermicidal jelly. It is inserted before intercourse. The diaphragm should be left in the vagina for 6 to 8 hours after intercourse.  Cervical cap. This is a round, soft, latex or plastic cup that fits over the cervix and must be fitted by a health care provider. The cap can be left in place for up to 48 hours after intercourse.  Sponge. This is a soft, circular piece of polyurethane foam. The sponge has spermicide in it. It is inserted into the vagina after wetting it and before sexual intercourse.  Spermicides. These are chemicals that kill or block sperm from entering the cervix and uterus. They come in the form of creams, jellies, suppositories, foam, or tablets. They do not require a prescription. They are inserted into the vagina with an applicator before having sexual intercourse. The process must be repeated every time you have sexual intercourse. Intrauterine contraception  Intrauterine device (IUD). This is a T-shaped device that is put in a woman's uterus during a menstrual period to prevent pregnancy. There are 2 types: ? Copper IUD. This type of IUD is wrapped in copper wire and is placed inside  the uterus. Copper makes the uterus and fallopian tubes produce a fluid that kills sperm. It can stay in place for 10 years. ? Hormone IUD. This type of IUD contains the hormone progestin (synthetic progesterone). The hormone thickens the cervical mucus and prevents sperm from entering the uterus, and it also thins the uterine lining to prevent implantation of a fertilized egg. The hormone can weaken or kill the sperm that get into the uterus. It can stay in place for 3-5 years, depending on which type of IUD is used. Permanent methods of contraception  Female tubal ligation. This is when the woman's fallopian tubes are surgically sealed, tied, or blocked to prevent the egg from traveling to the uterus.  Hysteroscopic sterilization. This involves placing a small coil or insert into each fallopian tube. Your doctor uses a technique called hysteroscopy to do the procedure. The device causes scar tissue to form. This results in permanent blockage   of the fallopian tubes, so the sperm cannot fertilize the egg. It takes about 3 months after the procedure for the tubes to become blocked. You must use another form of birth control for these 3 months.  Female sterilization. This is when the female has the tubes that carry sperm tied off (vasectomy).This blocks sperm from entering the vagina during sexual intercourse. After the procedure, the man can still ejaculate fluid (semen). Natural planning methods  Natural family planning. This is not having sexual intercourse or using a barrier method (condom, diaphragm, cervical cap) on days the woman could become pregnant.  Calendar method. This is keeping track of the length of each menstrual cycle and identifying when you are fertile.  Ovulation method. This is avoiding sexual intercourse during ovulation.  Symptothermal method. This is avoiding sexual intercourse during ovulation, using a thermometer and ovulation symptoms.  Post-ovulation method. This is timing  sexual intercourse after you have ovulated. Regardless of which type or method of contraception you choose, it is important that you use condoms to protect against the transmission of sexually transmitted infections (STIs). Talk with your health care provider about which form of contraception is most appropriate for you. This information is not intended to replace advice given to you by your health care provider. Make sure you discuss any questions you have with your health care provider. Document Released: 06/17/2005 Document Revised: 11/23/2015 Document Reviewed: 12/10/2012 Elsevier Interactive Patient Education  2017 [employer] Inc[employer]   Breastfeeding Deciding to breastfeed is one of the best choices you can make for you and your baby. A change in hormones during pregnancy causes your breast tissue to grow and increases the number and size of your milk ducts. These hormones also allow proteins, sugars, and fats from your blood supply to make breast milk in your milk-producing glands. Hormones prevent breast milk from being released before your baby is born as well as prompt milk flow after birth. Once breastfeeding has begun, thoughts of your baby, as well as his or her sucking or crying, can stimulate the release of milk from your milk-producing glands. Benefits of breastfeeding For Your Baby  Your first milk (colostrum) helps your baby's digestive system function better.  There are antibodies in your milk that help your baby fight off infections.  Your baby has a lower incidence of asthma, allergies, and sudden infant death syndrome.  The nutrients in breast milk are better for your baby than infant formulas and are designed uniquely for your baby's needs.  Breast milk improves your baby's brain development.  Your baby is less likely to develop other conditions, such as childhood obesity, asthma, or type 2 diabetes mellitus.  For You  Breastfeeding helps to create a very special bond  between you and your baby.  Breastfeeding is convenient. Breast milk is always available at the correct temperature and costs nothing.  Breastfeeding helps to burn calories and helps you lose the weight gained during pregnancy.  Breastfeeding makes your uterus contract to its prepregnancy size faster and slows bleeding (lochia) after you give birth.  Breastfeeding helps to lower your risk of developing type 2 diabetes mellitus, osteoporosis, and breast or ovarian cancer later in life.  Signs that your baby is hungry Early Signs of Hunger  Increased alertness or activity.  Stretching.  Movement of the head from side to side.  Movement of the head and opening of the mouth when the corner of the mouth or cheek is stroked (rooting).  Increased sucking sounds, smacking lips,   cooing, sighing, or squeaking.  Hand-to-mouth movements.  Increased sucking of fingers or hands.  Late Signs of Hunger  Fussing.  Intermittent crying.  Extreme Signs of Hunger Signs of extreme hunger will require calming and consoling before your baby will be able to breastfeed successfully. Do not wait for the following signs of extreme hunger to occur before you initiate breastfeeding:  Restlessness.  A loud, strong cry.  Screaming.  Breastfeeding basics Breastfeeding Initiation  Find a comfortable place to sit or lie down, with your neck and back well supported.  Place a pillow or rolled up blanket under your baby to bring him or her to the level of your breast (if you are seated). Nursing pillows are specially designed to help support your arms and your baby while you breastfeed.  Make sure that your baby's abdomen is facing your abdomen.  Gently massage your breast. With your fingertips, massage from your chest wall toward your nipple in a circular motion. This encourages milk flow. You may need to continue this action during the feeding if your milk flows slowly.  Support your breast with 4  fingers underneath and your thumb above your nipple. Make sure your fingers are well away from your nipple and your baby's mouth.  Stroke your baby's lips gently with your finger or nipple.  When your baby's mouth is open wide enough, quickly bring your baby to your breast, placing your entire nipple and as much of the colored area around your nipple (areola) as possible into your baby's mouth. ? More areola should be visible above your baby's upper lip than below the lower lip. ? Your baby's tongue should be between his or her lower gum and your breast.  Ensure that your baby's mouth is correctly positioned around your nipple (latched). Your baby's lips should create a seal on your breast and be turned out (everted).  It is common for your baby to suck about 2-3 minutes in order to start the flow of breast milk.  Latching Teaching your baby how to latch on to your breast properly is very important. An improper latch can cause nipple pain and decreased milk supply for you and poor weight gain in your baby. Also, if your baby is not latched onto your nipple properly, he or she may swallow some air during feeding. This can make your baby fussy. Burping your baby when you switch breasts during the feeding can help to get rid of the air. However, teaching your baby to latch on properly is still the best way to prevent fussiness from swallowing air while breastfeeding. Signs that your baby has successfully latched on to your nipple:  Silent tugging or silent sucking, without causing you pain.  Swallowing heard between every 3-4 sucks.  Muscle movement above and in front of his or her ears while sucking.  Signs that your baby has not successfully latched on to nipple:  Sucking sounds or smacking sounds from your baby while breastfeeding.  Nipple pain.  If you think your baby has not latched on correctly, slip your finger into the corner of your baby's mouth to break the suction and place it  between your baby's gums. Attempt breastfeeding initiation again. Signs of Successful Breastfeeding Signs from your baby:  A gradual decrease in the number of sucks or complete cessation of sucking.  Falling asleep.  Relaxation of his or her body.  Retention of a small amount of milk in his or her mouth.  Letting go of your breast   by himself or herself.  Signs from you:  Breasts that have increased in firmness, weight, and size 1-3 hours after feeding.  Breasts that are softer immediately after breastfeeding.  Increased milk volume, as well as a change in milk consistency and color by the fifth day of breastfeeding.  Nipples that are not sore, cracked, or bleeding.  Signs That Your Baby is Getting Enough Milk  Wetting at least 1-2 diapers during the first 24 hours after birth.  Wetting at least 5-6 diapers every 24 hours for the first week after birth. The urine should be clear or pale yellow by 5 days after birth.  Wetting 6-8 diapers every 24 hours as your baby continues to grow and develop.  At least 3 stools in a 24-hour period by age 5 days. The stool should be soft and yellow.  At least 3 stools in a 24-hour period by age 7 days. The stool should be seedy and yellow.  No loss of weight greater than 10% of birth weight during the first 3 days of age.  Average weight gain of 4-7 ounces (113-198 g) per week after age 4 days.  Consistent daily weight gain by age 5 days, without weight loss after the age of 2 weeks.  After a feeding, your baby may spit up a small amount. This is common. Breastfeeding frequency and duration Frequent feeding will help you make more milk and can prevent sore nipples and breast engorgement. Breastfeed when you feel the need to reduce the fullness of your breasts or when your baby shows signs of hunger. This is called "breastfeeding on demand." Avoid introducing a pacifier to your baby while you are working to establish breastfeeding (the  first 4-6 weeks after your baby is born). After this time you may choose to use a pacifier. Research has shown that pacifier use during the first year of a baby's life decreases the risk of sudden infant death syndrome (SIDS). Allow your baby to feed on each breast as long as he or she wants. Breastfeed until your baby is finished feeding. When your baby unlatches or falls asleep while feeding from the first breast, offer the second breast. Because newborns are often sleepy in the first few weeks of life, you may need to awaken your baby to get him or her to feed. Breastfeeding times will vary from baby to baby. However, the following rules can serve as a guide to help you ensure that your baby is properly fed:  Newborns (babies 4 weeks of age or younger) may breastfeed every 1-3 hours.  Newborns should not go longer than 3 hours during the day or 5 hours during the night without breastfeeding.  You should breastfeed your baby a minimum of 8 times in a 24-hour period until you begin to introduce solid foods to your baby at around 6 months of age.  Breast milk pumping Pumping and storing breast milk allows you to ensure that your baby is exclusively fed your breast milk, even at times when you are unable to breastfeed. This is especially important if you are going back to work while you are still breastfeeding or when you are not able to be present during feedings. Your lactation consultant can give you guidelines on how long it is safe to store breast milk. A breast pump is a machine that allows you to pump milk from your breast into a sterile bottle. The pumped breast milk can then be stored in a refrigerator or freezer. Some breast   pumps are operated by hand, while others use electricity. Ask your lactation consultant which type will work best for you. Breast pumps can be purchased, but some hospitals and breastfeeding support groups lease breast pumps on a monthly basis. A lactation consultant can  teach you how to hand express breast milk, if you prefer not to use a pump. Caring for your breasts while you breastfeed Nipples can become dry, cracked, and sore while breastfeeding. The following recommendations can help keep your breasts moisturized and healthy:  Avoid using soap on your nipples.  Wear a supportive bra. Although not required, special nursing bras and tank tops are designed to allow access to your breasts for breastfeeding without taking off your entire bra or top. Avoid wearing underwire-style bras or extremely tight bras.  Air dry your nipples for 3-4minutes after each feeding.  Use only cotton bra pads to absorb leaked breast milk. Leaking of breast milk between feedings is normal.  Use lanolin on your nipples after breastfeeding. Lanolin helps to maintain your skin's normal moisture barrier. If you use pure lanolin, you do not need to wash it off before feeding your baby again. Pure lanolin is not toxic to your baby. You may also hand express a few drops of breast milk and gently massage that milk into your nipples and allow the milk to air dry.  In the first few weeks after giving birth, some women experience extremely full breasts (engorgement). Engorgement can make your breasts feel heavy, warm, and tender to the touch. Engorgement peaks within 3-5 days after you give birth. The following recommendations can help ease engorgement:  Completely empty your breasts while breastfeeding or pumping. You may want to start by applying warm, moist heat (in the shower or with warm water-soaked hand towels) just before feeding or pumping. This increases circulation and helps the milk flow. If your baby does not completely empty your breasts while breastfeeding, pump any extra milk after he or she is finished.  Wear a snug bra (nursing or regular) or tank top for 1-2 days to signal your body to slightly decrease milk production.  Apply ice packs to your breasts, unless this is too  uncomfortable for you.  Make sure that your baby is latched on and positioned properly while breastfeeding.  If engorgement persists after 48 hours of following these recommendations, contact your health care provider or a lactation consultant. Overall health care recommendations while breastfeeding  Eat healthy foods. Alternate between meals and snacks, eating 3 of each per day. Because what you eat affects your breast milk, some of the foods may make your baby more irritable than usual. Avoid eating these foods if you are sure that they are negatively affecting your baby.  Drink milk, fruit juice, and water to satisfy your thirst (about 10 glasses a day).  Rest often, relax, and continue to take your prenatal vitamins to prevent fatigue, stress, and anemia.  Continue breast self-awareness checks.  Avoid chewing and smoking tobacco. Chemicals from cigarettes that pass into breast milk and exposure to secondhand smoke may harm your baby.  Avoid alcohol and drug use, including marijuana. Some medicines that may be harmful to your baby can pass through breast milk. It is important to ask your health care provider before taking any medicine, including all over-the-counter and prescription medicine as well as vitamin and herbal supplements. It is possible to become pregnant while breastfeeding. If birth control is desired, ask your health care provider about options that will be safe   for your baby. Contact a health care provider if:  You feel like you want to stop breastfeeding or have become frustrated with breastfeeding.  You have painful breasts or nipples.  Your nipples are cracked or bleeding.  Your breasts are red, tender, or warm.  You have a swollen area on either breast.  You have a fever or chills.  You have nausea or vomiting.  You have drainage other than breast milk from your nipples.  Your breasts do not become full before feedings by the fifth day after you give  birth.  You feel sad and depressed.  Your baby is too sleepy to eat well.  Your baby is having trouble sleeping.  Your baby is wetting less than 3 diapers in a 24-hour period.  Your baby has less than 3 stools in a 24-hour period.  Your baby's skin or the white part of his or her eyes becomes yellow.  Your baby is not gaining weight by 5 days of age. Get help right away if:  Your baby is overly tired (lethargic) and does not want to wake up and feed.  Your baby develops an unexplained fever. This information is not intended to replace advice given to you by your health care provider. Make sure you discuss any questions you have with your health care provider. Document Released: 06/17/2005 Document Revised: 11/29/2015 Document Reviewed: 12/09/2012 Elsevier Interactive Patient Education  2017 [employer]vier Inc.  

## 2017-05-08 LAB — PROTEIN / CREATININE RATIO, URINE
Creatinine, Urine: 355.4 mg/dL
Protein, Ur: 35.3 mg/dL
Protein/Creat Ratio: 99 mg/g creat (ref 0–200)

## 2017-05-08 LAB — COMPREHENSIVE METABOLIC PANEL
ALBUMIN: 3.5 g/dL (ref 3.5–5.5)
ALT: 25 IU/L (ref 0–32)
AST: 18 IU/L (ref 0–40)
Albumin/Globulin Ratio: 1.1 — ABNORMAL LOW (ref 1.2–2.2)
Alkaline Phosphatase: 108 IU/L (ref 39–117)
BILIRUBIN TOTAL: 0.2 mg/dL (ref 0.0–1.2)
BUN / CREAT RATIO: 9 (ref 9–23)
BUN: 6 mg/dL (ref 6–20)
CHLORIDE: 102 mmol/L (ref 96–106)
CO2: 23 mmol/L (ref 20–29)
CREATININE: 0.65 mg/dL (ref 0.57–1.00)
Calcium: 8.8 mg/dL (ref 8.7–10.2)
GFR calc non Af Amer: 121 mL/min/{1.73_m2} (ref >59)
GFR, EST AFRICAN AMERICAN: 140 mL/min/{1.73_m2} (ref >59)
GLOBULIN, TOTAL: 3.3 g/dL (ref 1.5–4.5)
GLUCOSE: 84 mg/dL (ref 65–99)
Potassium: 3.5 mmol/L (ref 3.5–5.2)
SODIUM: 137 mmol/L (ref 134–144)
TOTAL PROTEIN: 6.8 g/dL (ref 6.0–8.5)

## 2017-05-09 ENCOUNTER — Inpatient Hospital Stay (HOSPITAL_COMMUNITY)
Admission: AD | Admit: 2017-05-09 | Discharge: 2017-05-09 | Disposition: A | Discharge status: Home / Self Care | Payer: Medicaid Other | Source: Ambulatory Visit | Attending: Obstetrics & Gynecology | Admitting: Obstetrics & Gynecology

## 2017-05-09 ENCOUNTER — Encounter (HOSPITAL_COMMUNITY): Payer: Self-pay

## 2017-05-09 ENCOUNTER — Encounter: Payer: Self-pay | Admitting: Obstetrics and Gynecology

## 2017-05-09 ENCOUNTER — Telehealth: Payer: Self-pay | Admitting: Family Medicine

## 2017-05-09 DIAGNOSIS — Z7982 Long term (current) use of aspirin: Secondary | ICD-10-CM | POA: Diagnosis not present

## 2017-05-09 DIAGNOSIS — O210 Mild hyperemesis gravidarum: Secondary | ICD-10-CM | POA: Diagnosis not present

## 2017-05-09 DIAGNOSIS — Z3A11 11 weeks gestation of pregnancy: Secondary | ICD-10-CM | POA: Diagnosis not present

## 2017-05-09 DIAGNOSIS — K920 Hematemesis: Secondary | ICD-10-CM | POA: Diagnosis present

## 2017-05-09 DIAGNOSIS — Z87891 Personal history of nicotine dependence: Secondary | ICD-10-CM | POA: Insufficient documentation

## 2017-05-09 DIAGNOSIS — O10919 Unspecified pre-existing hypertension complicating pregnancy, unspecified trimester: Secondary | ICD-10-CM

## 2017-05-09 DIAGNOSIS — O99211 Obesity complicating pregnancy, first trimester: Secondary | ICD-10-CM

## 2017-05-09 DIAGNOSIS — O10011 Pre-existing essential hypertension complicating pregnancy, first trimester: Secondary | ICD-10-CM | POA: Insufficient documentation

## 2017-05-09 DIAGNOSIS — E669 Obesity, unspecified: Secondary | ICD-10-CM | POA: Insufficient documentation

## 2017-05-09 DIAGNOSIS — O26891 Other specified pregnancy related conditions, first trimester: Secondary | ICD-10-CM | POA: Insufficient documentation

## 2017-05-09 DIAGNOSIS — O10911 Unspecified pre-existing hypertension complicating pregnancy, first trimester: Secondary | ICD-10-CM

## 2017-05-09 DIAGNOSIS — O219 Vomiting of pregnancy, unspecified: Secondary | ICD-10-CM

## 2017-05-09 DIAGNOSIS — R8271 Bacteriuria: Secondary | ICD-10-CM | POA: Insufficient documentation

## 2017-05-09 DIAGNOSIS — R1115 Cyclical vomiting syndrome unrelated to migraine: Secondary | ICD-10-CM

## 2017-05-09 LAB — CYTOLOGY - PAP
ADEQUACY: ABSENT
CHLAMYDIA, DNA PROBE: NEGATIVE
Diagnosis: NEGATIVE
NEISSERIA GONORRHEA: NEGATIVE

## 2017-05-09 MED ORDER — FAMOTIDINE 10 MG PO CHEW: 10.0000 mg | CHEWABLE_TABLET | Freq: Two times a day (BID) | ORAL | 0 refills | Status: DC

## 2017-05-09 MED ORDER — METOCLOPRAMIDE HCL 5 MG/ML IJ SOLN
5.0000 mg | Freq: Once | INTRAMUSCULAR | Status: AC
  Administered 2017-05-09: 5 mg via INTRAVENOUS
  Filled 2017-05-09: qty 2

## 2017-05-09 NOTE — Telephone Encounter (Signed)
**  After Hours/ Emergency Line Call*  Received a call to report that Shirley [name] is a 26yo F who is a [id number] currenty pregnant at 4036w6d who was calling for advice after vomiting blood at home. States that had been feeling lightheaded for the last couple of days. She had been sitting in a tub of hot water, stood up, felt nauseous and vomiting "a lot of blood, more than a streak."  Denies abdominal pain or vaginal bleeding. Advised that patient head to hospital for evaluation. Patient voiced concern about being able to transport herself safely so recommended that she should call EMS given this. Will forward to PCP.  [name], DO PGY-2, [facility] 05/09/2017 3:50 AM

## 2017-05-09 NOTE — MAU Provider Note (Signed)
Chief Complaint: Emesis and Hypertension   SUBJECTIVE HPI: Shirley [name] is a 26 y.o. [id number] at 7654w6d who presents to MAU with concerns of hematemesis. Patient states she was taking a hot bath tonight when she felt sick all of a sudden. Stood up and then vomited up her food with blood. Blood was bright red and mixed with food. Hematemesis x1. She denies continued bleeding after episode. Patient has been suffering with morning sickness over the last several weeks. States she has been vomiting three times a day. She as given Rx for antiemetic at prenatal visit but states she has not picked up medications yet. Endorsing some lightheadedness. Has not been able to eat or drink well. Denies abdominal pain, fevers, vaginal discharge or bleeding.   Prenatal history complicated by obesity and cHTN.  Past Medical History:  Diagnosis Date  . Arthritis   . Hypertension   . No significant past medical history   . Obesity   . Sciatica    OB History  Gravida Para Term Preterm AB Living  4 2 2   1 2   SAB TAB Ectopic Multiple Live Births    1     2    # Outcome Date GA Lbr Len/2nd Weight Sex Delivery Anes PTL Lv  4 Current           3 TAB 08/01/09          2 Term 05/19/09 3918w0d  2.778 kg (6 lb 2 oz) F Vag-Spont EPI N LIV  1 Term 07/31/05 4540w0d  2.835 kg (6 lb 4 oz) F Vag-Spont EPI N LIV     History reviewed. No pertinent surgical history. Social History   Socioeconomic History  . Marital status: Single    Spouse name: Not on file  . Number of children: Not on file  . Years of education: Not on file  . Highest education level: Not on file  Social Needs  . Financial resource strain: Not on file  . Food insecurity - worry: Not on file  . Food insecurity - inability: Not on file  . Transportation needs - medical: Not on file  . Transportation needs - non-medical: Not on file  Occupational History  . Not on file  Tobacco Use  . Smoking status: Former Smoker    Packs/day: 0.50    Types:  Cigarettes    Last attempt to quit: 01/29/2017    Years since quitting: 0.2  . Smokeless tobacco: Never Used  Substance and Sexual Activity  . Alcohol use: Yes    Alcohol/week: 0.0 oz    Comment: rare  . Drug use: Yes    Types: Marijuana    Comment: daily usage  . Sexual activity: Yes  Other Topics Concern  . Not on file  Social History Narrative  . Not on file   No current facility-administered medications on file prior to encounter.    Current Outpatient Medications on File Prior to Encounter  Medication Sig Dispense Refill  . aspirin EC 81 MG tablet Take 1 tablet (81 mg total) daily by mouth. Take after 12 weeks for prevention of preeclampsia later in pregnancy 300 tablet 2  . cyclobenzaprine (FLEXERIL) 10 MG tablet Take 1 tablet (10 mg total) by mouth 2 (two) times daily as needed for muscle spasms. 20 tablet 0  . hydrochlorothiazide (MICROZIDE) 12.5 MG capsule Take 1 capsule (12.5 mg total) by mouth daily. (Patient not taking: Reported on 09/06/2016) 90 capsule 3  . labetalol (NORMODYNE) 200  MG tablet Take 1 tablet (200 mg total) 2 (two) times daily by mouth. 60 tablet 3  . meloxicam (MOBIC) 15 MG tablet Take 1 tablet (15 mg total) by mouth daily. 30 tablet 0  . ondansetron (ZOFRAN) 4 MG tablet Take 1 tablet (4 mg total) by mouth every 8 (eight) hours as needed for nausea or vomiting. 20 tablet 0  . predniSONE (DELTASONE) 20 MG tablet Take 2 tablets (40 mg total) by mouth daily. Take 40 mg by mouth daily for 3 days, then 20mg  by mouth daily for 3 days, then 10mg  daily for 3 days 12 tablet 0  . pregabalin (LYRICA) 75 MG capsule Take 1 capsule (75 mg total) by mouth 2 (two) times daily. Start with 1 tab at bedtime and slowly increase to 1 tablet two  times daily 30 capsule 0  . Prenat-Methylfol-Chol-Fish Oil (PRENATAL + COMPLETE MULTI) 0.267 & 373 MG THPK Take 1 tablet by mouth daily. 120 each 2   No Known Allergies  I have reviewed the past Medical Hx, Surgical Hx, Social Hx,  Allergies and Medications.   REVIEW OF SYSTEMS All systems reviewed and are negative for acute change except as noted in the HPI.   OBJECTIVE BP (!) 153/96 (BP Location: Right Arm)   Pulse 72   Temp 97.8 F (36.6 C) (Oral)   Resp 18   LMP 02/15/2017 (Exact Date)   SpO2 96%    PHYSICAL EXAM Constitutional: Well-developed, well-nourished female in no acute distress.  O/P clear without lesion or exudate, no erythema, no blood visualized, MMM Cardiovascular: normal rate and rhythm, pulses intact, brisk capillary refill Respiratory: normal rate and effort.  GI: Abd soft, non-tender, non-distended.  MS: Extremities nontender, no edema, normal ROM Neurologic: Alert and oriented x 4. No focal deficits Psych: normal mood and affect  LAB RESULTS No results found for this or any previous visit (from the past 24 hour(s)).  IMAGING No results found.  MAU COURSE Vitals and nursing notes reviewed - elevated BP in cHTN I have ordered labs/imaging and reviewed them Briefly able to pick up FHRTs on Doppler. Confirmed on US with active FM. Orthostatics wnl Treatments given in MAU: IV fluid bolus, Reglan IV, followed by PO challenge  MDM Plan of care reviewed with patient, including labs and tests ordered and medical treatment.   ASSESSMENT 1. Hematemesis with nausea   2. Chronic hypertension during pregnancy, antepartum   3. Obesity affecting pregnancy in first trimester   4. Morning sickness   5. Emesis, persistent    Mallory-Weiss tear vs PUD vs recurrent vomiting as cause of hematemsis.    PLAN Discharge home in stable condition Rx given for antacid Encouraged patient to pick up her BP and nausea medications If hematemesis again would recommend GI consult for upper endocsopy Counseled on return precautions Handout given   Caryl AdaJazma Yonah Tangeman, DO OB Fellow Faculty Practice, [facility] 05/09/2017, 5:30 AM

## 2017-05-09 NOTE — MAU Note (Signed)
Pt had morning sickness since beginning of pregnancy. Got RX for zofran on Wednesday. Hasn't taken. Vomited some blood 0300 today. Called MD and was told to come in.

## 2017-05-09 NOTE — Progress Notes (Signed)
Dr Doroteo GlassmanPhelps confirmed FHR via bedside US.

## 2017-05-16 ENCOUNTER — Ambulatory Visit (HOSPITAL_COMMUNITY)
Admission: RE | Admit: 2017-05-16 | Discharge: 2017-05-16 | Disposition: A | Discharge status: Home / Self Care | Payer: Medicaid Other | Source: Ambulatory Visit | Attending: Obstetrics and Gynecology | Admitting: Obstetrics and Gynecology

## 2017-05-16 ENCOUNTER — Encounter (HOSPITAL_COMMUNITY): Payer: Self-pay

## 2017-05-16 ENCOUNTER — Other Ambulatory Visit: Payer: Self-pay | Admitting: Obstetrics and Gynecology

## 2017-05-16 DIAGNOSIS — Z3A12 12 weeks gestation of pregnancy: Secondary | ICD-10-CM

## 2017-05-16 DIAGNOSIS — O161 Unspecified maternal hypertension, first trimester: Secondary | ICD-10-CM

## 2017-05-16 DIAGNOSIS — O0990 Supervision of high risk pregnancy, unspecified, unspecified trimester: Secondary | ICD-10-CM

## 2017-05-16 DIAGNOSIS — Z3682 Encounter for antenatal screening for nuchal translucency: Secondary | ICD-10-CM | POA: Insufficient documentation

## 2017-05-16 DIAGNOSIS — O10011 Pre-existing essential hypertension complicating pregnancy, first trimester: Secondary | ICD-10-CM | POA: Insufficient documentation

## 2017-05-16 DIAGNOSIS — O0991 Supervision of high risk pregnancy, unspecified, first trimester: Secondary | ICD-10-CM | POA: Insufficient documentation

## 2017-05-16 DIAGNOSIS — O99211 Obesity complicating pregnancy, first trimester: Secondary | ICD-10-CM | POA: Diagnosis not present

## 2017-05-16 DIAGNOSIS — O10919 Unspecified pre-existing hypertension complicating pregnancy, unspecified trimester: Secondary | ICD-10-CM

## 2017-06-04 ENCOUNTER — Ambulatory Visit (INDEPENDENT_AMBULATORY_CARE_PROVIDER_SITE_OTHER): Payer: Medicaid Other | Admitting: Obstetrics and Gynecology

## 2017-06-04 ENCOUNTER — Encounter: Payer: Self-pay | Admitting: Obstetrics and Gynecology

## 2017-06-04 ENCOUNTER — Ambulatory Visit (INDEPENDENT_AMBULATORY_CARE_PROVIDER_SITE_OTHER): Payer: Medicaid Other | Admitting: Clinical

## 2017-06-04 VITALS — BP 140/89 | HR 91 | Wt 297.0 lb

## 2017-06-04 DIAGNOSIS — F419 Anxiety disorder, unspecified: Secondary | ICD-10-CM

## 2017-06-04 DIAGNOSIS — O10919 Unspecified pre-existing hypertension complicating pregnancy, unspecified trimester: Secondary | ICD-10-CM

## 2017-06-04 DIAGNOSIS — R8271 Bacteriuria: Secondary | ICD-10-CM

## 2017-06-04 DIAGNOSIS — F4323 Adjustment disorder with mixed anxiety and depressed mood: Secondary | ICD-10-CM | POA: Diagnosis not present

## 2017-06-04 DIAGNOSIS — O0990 Supervision of high risk pregnancy, unspecified, unspecified trimester: Secondary | ICD-10-CM

## 2017-06-04 NOTE — BH Specialist Note (Signed)
Integrated Behavioral Health Initial Visit  MRN: [id number] Name: Shirley [name]  Number of Integrated Behavioral Health Clinician visits:: 1/6 Session Start time: 12:00  Session End time: 12:30 Total time: 30 minutes  Type of Service: Integrated Behavioral Health- Individual/Family Interpretor:No. Interpretor Name and Language: n/a   Warm Hand Off Completed.       SUBJECTIVE: Shirley [name] is a 26 y.o. female accompanied by n/a Patient was referred by Dr Earlene Plateravis for anxiety Patient reports the following symptoms/concerns: Pt states that her primary concern today is life stress, with primary symptoms being anhedonia, fatigue, depression, anxiety, and worry about life stress.  Duration of problem: Current pregnancy; Severity of problem: moderate    OBJECTIVE: Mood: Depressed and Affect: Depressed Risk of harm to self or others: No plan to harm self or others  LIFE CONTEXT: Family and Social: Lives with mother and 2 daughters(8,11) School/Work: - Self-Care: - Life Changes: Current pregnancy  GOALS ADDRESSED: Patient will: 1. Reduce symptoms of: anxiety, depression and stress 2. Increase knowledge and/or ability of: self-management skills  3. Demonstrate ability to: Increase healthy adjustment to current life circumstances  INTERVENTIONS: Interventions utilized: Mining engineerBehavioral Activation, Psychoeducation and/or Health Education and Link to [employer]  Standardized Assessments completed: GAD-7 and PHQ 9  ASSESSMENT: Patient currently experiencing Adjustment disorder with mixed anxious and depressed mood.   Patient may benefit from psychoeducation and brief therapeutic intervention regarding coping with symptoms of anxiety and depression.  PLAN: 1. Follow up with behavioral health clinician on : One month, or earlier as needed 2. Behavioral recommendations:  -Take at least one minute walk outside daily -Listen to music daily; find at least one song to listen to  while taking daily walks -Read educational material regarding coping with symptoms of anxiety and depression -Consider community resource, discussed in office visit today 3. Referral(s): [employer]:  [facility] 4. "From scale of 1-10, how likely are you to follow plan?": 7  Shirley LipsJamie C Ayeshia Coppin, LCSW   Depression screen [facility] 2/9 06/04/2017 05/07/2017 04/04/2017 12/17/2016 09/06/2016  Decreased Interest 3 2 0 0 0  Down, Depressed, Hopeless 2 1 0 0 0  PHQ - 2 Score 5 3 0 0 0  Altered sleeping 2 1 - - 0  Tired, decreased energy 3 2 - - 0  Change in appetite 1 2 - - 0  Feeling bad or failure about yourself  1 1 - - 0  Trouble concentrating 0 0 - - 0  Moving slowly or fidgety/restless 0 0 - - 0  Suicidal thoughts 0 0 - - 0  PHQ-9 Score 12 9 - - 0   GAD 7 : Generalized Anxiety Score 06/04/2017 05/07/2017  Nervous, Anxious, on Edge 1 0  Control/stop worrying 2 0  Worry too much - different things 2 0  Trouble relaxing 2 1  Restless 2 0  Easily annoyed or irritable 1 1  Afraid - awful might happen 0 0  Total GAD 7 Score 10 2   hemo

## 2017-06-04 NOTE — Progress Notes (Signed)
   PRENATAL VISIT NOTE  Subjective:  Shirley [name] is a 26 y.o. [id number] at 3367w4d being seen today for ongoing prenatal care.  She is currently monitored for the following issues for this high-risk pregnancy and has Obesity, Class III, BMI 40-49.9 (morbid obesity) (HCC); Primary hypertension; Submandibular abscess; Low back pain with right-sided sciatica; Spinal stenosis of lumbar region with radiculopathy; Osteoarthritis of both knees; Frequent No-show for appointment; Supervision of high risk pregnancy, antepartum; Chronic hypertension during pregnancy, antepartum; Obesity complicating pregnancy; and GBS bacteriuria on their problem list.  Patient reports fatigue.  Contractions: Not present. Vag. Bleeding: None.  Movement: Absent. Denies leaking of fluid.   The following portions of the patient's history were reviewed and updated as appropriate: allergies, current medications, past family history, past medical history, past social history, past surgical history and problem list. Problem list updated.  Objective:   Vitals:   06/04/17 1118 06/04/17 1122  BP: (!) 150/108 140/89  Pulse: 91   Weight: 297 lb (134.7 kg)     Fetal Status: Fetal Heart Rate (bpm): 150   Movement: Absent     General:  Alert, oriented and cooperative. Patient is in no acute distress.  Skin: Skin is warm and dry. No rash noted.   Cardiovascular: Normal heart rate noted  Respiratory: Normal respiratory effort, no problems with respiration noted  Abdomen: Soft, gravid, appropriate for gestational age.  Pain/Pressure: Present     Pelvic: Cervical exam deferred        Extremities: Normal range of motion.  Edema: None  Mental Status:  Normal mood and affect. Normal behavior. Normal judgment and thought content.   Assessment and Plan:  Pregnancy: [id number] at 4767w4d  1. Chronic hypertension during pregnancy, antepartum Not taking labetalol 200 mg BID because it is not being covered by insurance Nurse to call  pharmacy to figure out what is going on Not taking baby ASA, reviewed importance  2. Supervision of high risk pregnancy, antepartum Anatomy 12/26  3. GBS bacteriuria ppx labor  4. Obesity, Class III, BMI 40-49.9 (morbid obesity) (HCC)  5. Anxiety - Ambulatory referral to Integrated Behavioral Health - feeling very fatigued, hard time getting out of bed, receptive to speaking with LCSW  Preterm labor symptoms and general obstetric precautions including but not limited to vaginal bleeding, contractions, leaking of fluid and fetal movement were reviewed in detail with the patient. Please refer to After Visit Summary for other counseling recommendations.  Return in about 3 weeks (around 06/25/2017) for OB visit (MD).   Conan BowensKelly M Davis, MD

## 2017-06-04 NOTE — Progress Notes (Signed)
Patient verbally consented to meet with Behavioral Health Clinician about presenting concerns. Dr Davis aware  

## 2017-06-04 NOTE — Progress Notes (Signed)
Patient denies headaches, dizziness, or blurry vision, Just difficulty breathing for past couple of weeks. Patient states pharmacy told her, her BP med wasn't covered by insurance and it costs $55 which she cannot afford

## 2017-06-25 ENCOUNTER — Other Ambulatory Visit (HOSPITAL_COMMUNITY): Payer: Self-pay | Admitting: *Deleted

## 2017-06-25 ENCOUNTER — Encounter (HOSPITAL_COMMUNITY): Payer: Self-pay

## 2017-06-25 ENCOUNTER — Ambulatory Visit (HOSPITAL_COMMUNITY)
Admission: RE | Admit: 2017-06-25 | Discharge: 2017-06-25 | Disposition: A | Discharge status: Home / Self Care | Payer: Medicaid Other | Source: Ambulatory Visit | Attending: Obstetrics and Gynecology | Admitting: Obstetrics and Gynecology

## 2017-06-25 DIAGNOSIS — O0992 Supervision of high risk pregnancy, unspecified, second trimester: Secondary | ICD-10-CM | POA: Diagnosis not present

## 2017-06-25 DIAGNOSIS — O99212 Obesity complicating pregnancy, second trimester: Secondary | ICD-10-CM | POA: Insufficient documentation

## 2017-06-25 DIAGNOSIS — O0990 Supervision of high risk pregnancy, unspecified, unspecified trimester: Secondary | ICD-10-CM | POA: Diagnosis present

## 2017-06-25 DIAGNOSIS — O10912 Unspecified pre-existing hypertension complicating pregnancy, second trimester: Secondary | ICD-10-CM | POA: Diagnosis not present

## 2017-06-25 DIAGNOSIS — Z3689 Encounter for other specified antenatal screening: Secondary | ICD-10-CM | POA: Insufficient documentation

## 2017-06-25 DIAGNOSIS — O10919 Unspecified pre-existing hypertension complicating pregnancy, unspecified trimester: Secondary | ICD-10-CM

## 2017-06-25 DIAGNOSIS — Z3A18 18 weeks gestation of pregnancy: Secondary | ICD-10-CM | POA: Insufficient documentation

## 2017-07-01 NOTE — L&D Delivery Note (Addendum)
Delivery Note Progressed quickly to complete dilation and pushed well only a few times to delivery  At 7:33 AM a viable and healthy female was delivered via Vaginal, Spontaneous (Presentation:LOA  ).  APGAR: 9, 9; weight  .   Placenta status: spontaneous and grossly intact with 3 vessel cord:  with the following complications: none  Anesthesia:  epidural Episiotomy: None Lacerations: None Suture Repair: none Est. Blood Loss (mL):  200  Mom to postpartum.  Baby to Couplet care / Skin to Skin.  Wynelle Bourgeois 11/02/2017, 7:49 AM  Please schedule this patient for Postpartum visit in: 1 week with the following provider: Any provider For C/S patients schedule nurse incision check in weeks 2 weeks: no High risk pregnancy complicated by: HTN Delivery mode:  SVD Anticipated Birth Control:  other/unsure PP Procedures needed: BP check  Schedule Integrated BH visit: no

## 2017-07-08 ENCOUNTER — Encounter (HOSPITAL_COMMUNITY): Payer: Self-pay | Admitting: Student

## 2017-07-08 ENCOUNTER — Inpatient Hospital Stay (HOSPITAL_COMMUNITY)
Admission: AD | Admit: 2017-07-08 | Discharge: 2017-07-08 | Disposition: A | Discharge status: Home / Self Care | Payer: Medicaid Other | Source: Ambulatory Visit | Attending: Obstetrics and Gynecology | Admitting: Obstetrics and Gynecology

## 2017-07-08 ENCOUNTER — Other Ambulatory Visit: Payer: Self-pay

## 2017-07-08 DIAGNOSIS — Z7982 Long term (current) use of aspirin: Secondary | ICD-10-CM | POA: Insufficient documentation

## 2017-07-08 DIAGNOSIS — Z79899 Other long term (current) drug therapy: Secondary | ICD-10-CM | POA: Diagnosis not present

## 2017-07-08 DIAGNOSIS — O162 Unspecified maternal hypertension, second trimester: Secondary | ICD-10-CM | POA: Insufficient documentation

## 2017-07-08 DIAGNOSIS — O99212 Obesity complicating pregnancy, second trimester: Secondary | ICD-10-CM | POA: Diagnosis not present

## 2017-07-08 DIAGNOSIS — R55 Syncope and collapse: Secondary | ICD-10-CM | POA: Diagnosis present

## 2017-07-08 DIAGNOSIS — O10912 Unspecified pre-existing hypertension complicating pregnancy, second trimester: Secondary | ICD-10-CM | POA: Diagnosis not present

## 2017-07-08 DIAGNOSIS — O10919 Unspecified pre-existing hypertension complicating pregnancy, unspecified trimester: Secondary | ICD-10-CM

## 2017-07-08 DIAGNOSIS — O26892 Other specified pregnancy related conditions, second trimester: Secondary | ICD-10-CM | POA: Diagnosis not present

## 2017-07-08 DIAGNOSIS — Z87891 Personal history of nicotine dependence: Secondary | ICD-10-CM | POA: Insufficient documentation

## 2017-07-08 DIAGNOSIS — R42 Dizziness and giddiness: Secondary | ICD-10-CM | POA: Insufficient documentation

## 2017-07-08 DIAGNOSIS — Z3A20 20 weeks gestation of pregnancy: Secondary | ICD-10-CM | POA: Insufficient documentation

## 2017-07-08 LAB — URINALYSIS, ROUTINE W REFLEX MICROSCOPIC
Bilirubin Urine: NEGATIVE
Glucose, UA: NEGATIVE mg/dL
HGB URINE DIPSTICK: NEGATIVE
Ketones, ur: NEGATIVE mg/dL
Leukocytes, UA: NEGATIVE
NITRITE: NEGATIVE
PROTEIN: NEGATIVE mg/dL
Specific Gravity, Urine: 1.015 (ref 1.005–1.030)
pH: 5 (ref 5.0–8.0)

## 2017-07-08 LAB — CBC
HCT: 33.3 % — ABNORMAL LOW (ref 36.0–46.0)
Hemoglobin: 11 g/dL — ABNORMAL LOW (ref 12.0–15.0)
MCH: 27 pg (ref 26.0–34.0)
MCHC: 33 g/dL (ref 30.0–36.0)
MCV: 81.8 fL (ref 78.0–100.0)
PLATELETS: 268 10*3/uL (ref 150–400)
RBC: 4.07 MIL/uL (ref 3.87–5.11)
RDW: 14.2 % (ref 11.5–15.5)
WBC: 13.4 10*3/uL — AB (ref 4.0–10.5)

## 2017-07-08 LAB — COMPREHENSIVE METABOLIC PANEL
ALBUMIN: 2.6 g/dL — AB (ref 3.5–5.0)
ALT: 13 U/L — AB (ref 14–54)
AST: 14 U/L — AB (ref 15–41)
Alkaline Phosphatase: 114 U/L (ref 38–126)
Anion gap: 10 (ref 5–15)
BUN: 6 mg/dL (ref 6–20)
CHLORIDE: 101 mmol/L (ref 101–111)
CO2: 24 mmol/L (ref 22–32)
Calcium: 8.9 mg/dL (ref 8.9–10.3)
Creatinine, Ser: 0.61 mg/dL (ref 0.44–1.00)
GFR calc Af Amer: >60 mL/min (ref >60)
GFR calc non Af Amer: >60 mL/min (ref >60)
GLUCOSE: 95 mg/dL (ref 65–99)
POTASSIUM: 3.1 mmol/L — AB (ref 3.5–5.1)
SODIUM: 135 mmol/L (ref 135–145)
Total Bilirubin: 0.4 mg/dL (ref 0.3–1.2)
Total Protein: 7.5 g/dL (ref 6.5–8.1)

## 2017-07-08 LAB — PROTEIN / CREATININE RATIO, URINE
CREATININE, URINE: 234 mg/dL
Protein Creatinine Ratio: 0.05 mg/mg{Cre} (ref 0.00–0.15)
Total Protein, Urine: 11 mg/dL

## 2017-07-08 LAB — GLUCOSE, CAPILLARY: GLUCOSE-CAPILLARY: 88 mg/dL (ref 65–99)

## 2017-07-08 MED ORDER — ASPIRIN EC 81 MG PO TBEC: 81.0000 mg | DELAYED_RELEASE_TABLET | Freq: Every day | ORAL | 3 refills | Status: DC

## 2017-07-08 NOTE — Discharge Instructions (Signed)
Dizziness °Dizziness is a common problem. It makes you feel unsteady or light-headed. You may feel like you are about to pass out (faint). Dizziness can lead to getting hurt if you stumble or fall. Dizziness can be caused by many things, including: °· Medicines. °· Not having enough water in your body (dehydration). °· Illness. ° °Follow these instructions at home: °Eating and drinking °· Drink enough fluid to keep your pee (urine) clear or pale yellow. This helps to keep you from getting dehydrated. Try to drink more clear fluids, such as water. °· Do not drink alcohol. °· Limit how much caffeine you drink or eat, if your doctor tells you to do that. °· Limit how much salt (sodium) you drink or eat, if your doctor tells you to do that. °Activity °· Avoid making quick movements. °? When you stand up from sitting in a chair, steady yourself until you feel okay. °? In the morning, first sit up on the side of the bed. When you feel okay, stand slowly while you hold onto something. Do this until you know that your balance is fine. °· If you need to stand in one place for a long time, move your legs often. Tighten and relax the muscles in your legs while you are standing. °· Do not drive or use heavy machinery if you feel dizzy. °· Avoid bending down if you feel dizzy. Place items in your home so you can reach them easily without leaning over. °Lifestyle °· Do not use any products that contain nicotine or tobacco, such as cigarettes and e-cigarettes. If you need help quitting, ask your doctor. °· Try to lower your stress level. You can do this by using methods such as yoga or meditation. Talk with your doctor if you need help. °General instructions °· Watch your dizziness for any changes. °· Take over-the-counter and prescription medicines only as told by your doctor. Talk with your doctor if you think that you are dizzy because of a medicine that you are taking. °· Tell a friend or a family member that you are feeling  dizzy. If he or she notices any changes in your behavior, have this person call your doctor. °· Keep all follow-up visits as told by your doctor. This is important. °Contact a doctor if: °· Your dizziness does not go away. °· Your dizziness or light-headedness gets worse. °· You feel sick to your stomach (nauseous). °· You have trouble hearing. °· You have new symptoms. °· You are unsteady on your feet. °· You feel like the room is spinning. °Get help right away if: °· You throw up (vomit) or have watery poop (diarrhea), and you cannot eat or drink anything. °· You have trouble: °? Talking. °? Walking. °? Swallowing. °? Using your arms, hands, or legs. °· You feel generally weak. °· You are not thinking clearly, or you have trouble forming sentences. A friend or family member may notice this. °· You have: °? Chest pain. °? Pain in your belly (abdomen). °? Shortness of breath. °? Sweating. °· Your vision changes. °· You are bleeding. °· You have a very bad headache. °· You have neck pain or a stiff neck. °· You have a fever. °These symptoms may be an emergency. Do not wait to see if the symptoms will go away. Get medical help right away. Call your local emergency services (911 in the [place] Do not drive yourself to the hospital. °Summary °· Dizziness makes you feel unsteady or light-headed. You may   feel like you are about to pass out (faint).  Drink enough fluid to keep your pee (urine) clear or pale yellow. Do not drink alcohol.  Avoid making quick movements if you feel dizzy.  Watch your dizziness for any changes. This information is not intended to replace advice given to you by your health care provider. Make sure you discuss any questions you have with your health care provider. Document Released: 06/06/2011 Document Revised: 07/04/2016 Document Reviewed: 07/04/2016 Elsevier Interactive Patient Education  2017 [employer]  Hypertension During Pregnancy Hypertension, commonly called high blood  pressure, is when the force of blood pumping through your arteries is too strong. Arteries are blood vessels that carry blood from the heart throughout the body. Hypertension during pregnancy can cause problems for you and your baby. Your baby may be born early (prematurely) or may not weigh as much as he or she should at birth. Very bad cases of hypertension during pregnancy can be life-threatening. Different types of hypertension can occur during pregnancy. These include:  Chronic hypertension. This happens when: ? You have hypertension before pregnancy and it continues during pregnancy. ? You develop hypertension before you are [redacted] weeks pregnant, and it continues during pregnancy.  Gestational hypertension. This is hypertension that develops after the 20th week of pregnancy.  Preeclampsia, also called toxemia of pregnancy. This is a very serious type of hypertension that develops only during pregnancy. It affects the whole body, and it can be very dangerous for you and your baby.  Gestational hypertension and preeclampsia usually go away within 6 weeks after your baby is born. Women who have hypertension during pregnancy have a greater chance of developing hypertension later in life or during future pregnancies. What are the causes? The exact cause of hypertension is not known. What increases the risk? There are certain factors that make it more likely for you to develop hypertension during pregnancy. These include:  Having hypertension during a previous pregnancy or prior to pregnancy.  Being overweight.  Being older than age 24.  Being pregnant for the first time or being pregnant with more than one baby.  Becoming pregnant using fertilization methods such as IVF (in vitro fertilization).  Having diabetes, kidney problems, or systemic lupus erythematosus.  Having a family history of hypertension.  What are the signs or symptoms? Chronic hypertension and gestational hypertension  rarely cause symptoms. Preeclampsia causes symptoms, which may include:  Increased protein in your urine. Your health care provider will check for this at every visit before you give birth (prenatal visit).  Severe headaches.  Sudden weight gain.  Swelling of the hands, face, legs, and feet.  Nausea and vomiting.  Vision problems, such as blurred or double vision.  Numbness in the face, arms, legs, and feet.  Dizziness.  Slurred speech.  Sensitivity to bright lights.  Abdominal pain.  Convulsions.  How is this diagnosed? You may be diagnosed with hypertension during a routine prenatal exam. At each prenatal visit, you may:  Have a urine test to check for high amounts of protein in your urine.  Have your blood pressure checked. A blood pressure reading is recorded as two numbers, such as "120 over 80" (or 120/80). The first ("top") number is called the systolic pressure. It is a measure of the pressure in your arteries when your heart beats. The second ("bottom") number is called the diastolic pressure. It is a measure of the pressure in your arteries as your heart relaxes between beats. Blood pressure is measured  in a unit called mm Hg. A normal blood pressure reading is: ? Systolic: below 120. ? Diastolic: below 80.  The type of hypertension that you are diagnosed with depends on your test results and when your symptoms developed.  Chronic hypertension is usually diagnosed before 20 weeks of pregnancy.  Gestational hypertension is usually diagnosed after 20 weeks of pregnancy.  Hypertension with high amounts of protein in the urine is diagnosed as preeclampsia.  Blood pressure measurements that stay above 160 systolic, or above 110 diastolic, are signs of severe preeclampsia.  How is this treated? Treatment for hypertension during pregnancy varies depending on the type of hypertension you have and how serious it is.  If you take medicines called ACE inhibitors to  treat chronic hypertension, you may need to switch medicines. ACE inhibitors should not be taken during pregnancy.  If you have gestational hypertension, you may need to take blood pressure medicine.  If you are at risk for preeclampsia, your health care provider may recommend that you take a low-dose aspirin every day to prevent high blood pressure during your pregnancy.  If you have severe preeclampsia, you may need to be hospitalized so you and your baby can be monitored closely. You may also need to take medicine (magnesium sulfate) to prevent seizures and to lower blood pressure. This medicine may be given as an injection or through an IV tube.  In some cases, if your condition gets worse, you may need to deliver your baby early.  Follow these instructions at home: Eating and drinking  Drink enough fluid to keep your urine clear or pale yellow.  Eat a healthy diet that is low in salt (sodium). Do not add salt to your food. Check food labels to see how much sodium a food or beverage contains. Lifestyle  Do not use any products that contain nicotine or tobacco, such as cigarettes and e-cigarettes. If you need help quitting, ask your health care provider.  Do not use alcohol.  Avoid caffeine.  Avoid stress as much as possible. Rest and get plenty of sleep. General instructions  Take over-the-counter and prescription medicines only as told by your health care provider.  While lying down, lie on your left side. This keeps pressure off your baby.  While sitting or lying down, raise (elevate) your feet. Try putting some pillows under your lower legs.  Exercise regularly. Ask your health care provider what kinds of exercise are best for you.  Keep all prenatal and follow-up visits as told by your health care provider. This is important. Contact a health care provider if:  You have symptoms that your health care provider told you may require more treatment or monitoring, such  as: ? Fever. ? Vomiting. ? Headache. Get help right away if:  You have severe abdominal pain or vomiting that does not get better with treatment.  You suddenly develop swelling in your hands, ankles, or face.  You gain 4 lbs (1.8 kg) or more in 1 week.  You develop vaginal bleeding, or you have blood in your urine.  You do not feel your baby moving as much as usual.  You have blurred or double vision.  You have muscle twitching or sudden tightening (spasms).  You have shortness of breath.  Your lips or fingernails turn blue. This information is not intended to replace advice given to you by your health care provider. Make sure you discuss any questions you have with your health care provider. Document Released: 03/05/2011 Document  Revised: 01/05/2016 Document Reviewed: 12/01/2015 Elsevier Interactive Patient Education  [employer].

## 2017-07-08 NOTE — MAU Note (Signed)
[facility]  Reports BP taken there was elevated.  Came in for eval.  Denies abdominal cramping, LOF, or VB.  Repots +FM.

## 2017-07-08 NOTE — MAU Provider Note (Signed)
History     CSN: [id number]  Arrival date and time: 07/08/17 1142   First Provider Initiated Contact with Patient 07/08/17 1300      Chief Complaint  Patient presents with  . Near Syncope  . Hypertension   HPI Shirley [name] is a 27 y.o. [id number] at 64w4dwho presents with dizziness. This is a recurrent issue that started at [redacted] wks GA. States she has been seen several times for this symptoms. Reports when she is standing for more than 20 minutes or if she goes out in public, that she becomes dizzy & feels like she is going to pass out. Has reportedly passed out once. [facility], had BP taken & was elevated so was sent here. Patient with history of chronic hypertension; is supposed to be taking labetalol & baby ASA but hasn't started her meds d/t insurance issues. Denies chest pain or SOB. Denies cardiac history. [facility] Has eaten since then & feels better.   OB History    Gravida Para Term Preterm AB Living   _0 SAB TAB Ectopic Multiple Live Births     1     2      Past Medical History:  Diagnosis Date  . Arthritis   . Hypertension   . Obesity   . Sciatica     Past Surgical History:  Procedure Laterality Date  . TOOTH EXTRACTION N/A 10/22/2013   Procedure: DENTAL EXTRACTIONS TEETH #1, 16, 17, 32;  Surgeon: SGae Bon DDS;  [place]  Service: Oral Surgery;  Laterality: N/A;    Family History  Problem Relation Age of Onset  . Hypertension Mother   . Diabetes Maternal Grandmother   . Diabetes Paternal Grandmother   . Stroke Paternal Grandmother     Social History   Tobacco Use  . Smoking status: Former Smoker    Packs/day: 0.50    Types: Cigarettes    Last attempt to quit: 01/29/2017    Years since quitting: 0.4  . Smokeless tobacco: Never Used  Substance Use Topics  . Alcohol use: No    Alcohol/week: 0.0 oz    Frequency: Never    Comment: rare  . Drug  use: No    Comment: daily usage    Allergies: No Known Allergies  Medications Prior to Admission  Medication Sig Dispense Refill Last Dose  . aspirin EC 81 MG tablet Take 1 tablet (81 mg total) daily by mouth. Take after 12 weeks for prevention of preeclampsia later in pregnancy (Patient not taking: Reported on 05/16/2017) 300 tablet 2 Not Taking  . cyclobenzaprine (FLEXERIL) 10 MG tablet Take 1 tablet (10 mg total) by mouth 2 (two) times daily as needed for muscle spasms. (Patient not taking: Reported on 05/16/2017) 20 tablet 0 Not Taking  . famotidine (PEPCID AC) 10 MG chewable tablet Chew 1 tablet (10 mg total) 2 (two) times daily by mouth. (Patient not taking: Reported on 05/16/2017) 60 tablet 0 Not Taking  . labetalol (NORMODYNE) 200 MG tablet Take 1 tablet (200 mg total) 2 (two) times daily by mouth. 60 tablet 3 Taking  . ondansetron (ZOFRAN) 4 MG tablet Take 1 tablet (4 mg total) by mouth every 8 (eight) hours as needed for nausea or vomiting. (Patient not taking: Reported on 05/16/2017) 20 tablet 0 Not Taking  . pregabalin (LYRICA) 75 MG capsule Take  1 capsule (75 mg total) by mouth 2 (two) times daily. Start with 1 tab at bedtime and slowly increase to 1 tablet two  times daily (Patient not taking: Reported on 05/16/2017) 30 capsule 0 Not Taking  . Prenat-Methylfol-Chol-Fish Oil (PRENATAL + COMPLETE MULTI) 0.267 & 373 MG THPK Take 1 tablet by mouth daily. (Patient not taking: Reported on 06/04/2017) 120 each 2 Not Taking    Review of Systems  Constitutional: Negative.   Eyes: Negative for visual disturbance.  Respiratory: Negative for shortness of breath.   Cardiovascular: Negative for chest pain.  Gastrointestinal: Negative.   Genitourinary: Negative.   Neurological: Positive for dizziness and light-headedness. Negative for syncope and headaches.   Physical Exam   Blood pressure 130/67, pulse 68, temperature 98.7 F (37.1 C), temperature source Oral, resp. rate 18, height 5' 7"  (1.702 m), weight (!) 302 lb (137 kg), last menstrual period 02/15/2017, SpO2 100 %. Patient Vitals for the past 24 hrs:  BP Temp Temp src Pulse Resp  07/08/17 1531 130/67 98.7 F (37.1 C) Oral 68 18  07/08/17 1331 (!) 141/92 - - 88 -  07/08/17 1329 (!) 154/86 - - 79 -  07/08/17 1328 (!) 153/86 - - 88 -  07/08/17 1316 140/90 - - 79 -  07/08/17 1300 125/71 - - 82 -  07/08/17 1246 118/66 - - 84 -  07/08/17 1244 115/62 - - 76 -  07/08/17 1243 119/73 - - 75 -    Physical Exam  Nursing note and vitals reviewed. Constitutional: She is oriented to person, place, and time. She appears well-developed and well-nourished. No distress.  HENT:  Head: Normocephalic and atraumatic.  Eyes: Conjunctivae are normal. Right eye exhibits no discharge. Left eye exhibits no discharge. No scleral icterus.  Neck: Normal range of motion.  Cardiovascular: Normal rate, regular rhythm and normal heart sounds.  No murmur heard. Respiratory: Effort normal and breath sounds normal. No respiratory distress. She has no wheezes.  GI: Soft. There is no tenderness.  Neurological: She is alert and oriented to person, place, and time.  Skin: Skin is warm and dry. She is not diaphoretic.  Psychiatric: She has a normal mood and affect. Her behavior is normal. Judgment and thought content normal.    MAU Course  Procedures Results for orders placed or performed during the hospital encounter of 07/08/17 (from the past 48 hour(s))  Urinalysis, Routine w reflex microscopic     Status: Abnormal   Collection Time: 07/08/17 12:30 PM  Result Value Ref Range   Color, Urine YELLOW YELLOW   APPearance HAZY (A) CLEAR   Specific Gravity, Urine 1.015 1.005 - 1.030   pH 5.0 5.0 - 8.0   Glucose, UA NEGATIVE NEGATIVE mg/dL   Hgb urine dipstick NEGATIVE NEGATIVE   Bilirubin Urine NEGATIVE NEGATIVE   Ketones, ur NEGATIVE NEGATIVE mg/dL   Protein, ur NEGATIVE NEGATIVE mg/dL   Nitrite NEGATIVE NEGATIVE   Leukocytes, UA NEGATIVE  NEGATIVE  Protein / creatinine ratio, urine     Status: None   Collection Time: 07/08/17 12:30 PM  Result Value Ref Range   Creatinine, Urine 234.00 mg/dL   Total Protein, Urine 11 mg/dL    Comment: NO NORMAL RANGE ESTABLISHED FOR THIS TEST   Protein Creatinine Ratio 0.05 0.00 - 0.15 mg/mg[Cre]  Glucose, capillary     Status: None   Collection Time: 07/08/17  1:18 PM  Result Value Ref Range   Glucose-Capillary 88 65 - 99 mg/dL  CBC  Status: Abnormal   Collection Time: 07/08/17  1:29 PM  Result Value Ref Range   WBC 13.4 (H) 4.0 - 10.5 K/uL   RBC 4.07 3.87 - 5.11 MIL/uL   Hemoglobin 11.0 (L) 12.0 - 15.0 g/dL   HCT 33.3 (L) 36.0 - 46.0 %   MCV 81.8 78.0 - 100.0 fL   MCH 27.0 26.0 - 34.0 pg   MCHC 33.0 30.0 - 36.0 g/dL   RDW 14.2 11.5 - 15.5 %   Platelets 268 150 - 400 K/uL  Comprehensive metabolic panel     Status: Abnormal   Collection Time: 07/08/17  1:29 PM  Result Value Ref Range   Sodium 135 135 - 145 mmol/L   Potassium 3.1 (L) 3.5 - 5.1 mmol/L   Chloride 101 101 - 111 mmol/L   CO2 24 22 - 32 mmol/L   Glucose, Bld 95 65 - 99 mg/dL   BUN 6 6 - 20 mg/dL   Creatinine, Ser 0.61 0.44 - 1.00 mg/dL   Calcium 8.9 8.9 - 10.3 mg/dL   Total Protein 7.5 6.5 - 8.1 g/dL   Albumin 2.6 (L) 3.5 - 5.0 g/dL   AST 14 (L) 15 - 41 U/L   ALT 13 (L) 14 - 54 U/L   Alkaline Phosphatase 114 38 - 126 U/L   Total Bilirubin 0.4 0.3 - 1.2 mg/dL   GFR calc non Af Amer >60 >60 mL/min   GFR calc Af Amer >60 >60 mL/min    Comment: (NOTE) The eGFR has been calculated using the CKD EPI equation. This calculation has not been validated in all clinical situations. eGFR's persistently <60 mL/min signify possible Chronic Kidney Disease.    Anion gap 10 5 - 15    MDM FHT 150 Elevated BPs, none severe range. Improved from initial OB visit.  Not orthostatic Hgb stable at 11 ED EKG -- reviewed by Dr. Oval Linsey with cardiology -- similar to previous EKGs C/w Dr. Rosana Hoes. Given improvement in BPs, will  not have patient start taking labetalol. Pt does need to start taking baby ASA.   Assessment and Plan  A; 1. Dizzy spells   2. Chronic hypertension during pregnancy, antepartum   3. [redacted] weeks gestation of pregnancy    P: Discharge home D/c labetalol Start taking baby ASA as previously prescribed Increase water intake, eat Q2-3 hours, slow position changes Pt has f/u with Dr. Rosana Hoes in office for ob appt Discussed reasons to return to [place] 07/08/2017, 1:00 PM

## 2017-07-08 NOTE — MAU Note (Signed)
Pt is a G4P2 at 20.3 weeks reporting [facility].  [facility]  Pt reports that she has felt faint often during this pregnancy and "it happens every time I go out." Pt reports she had elevated BP in all pregnancies and after, and is currently prescribed medication since October that she does not take because she can not afford it.

## 2017-07-09 ENCOUNTER — Encounter: Payer: Self-pay | Admitting: Family Medicine

## 2017-07-09 ENCOUNTER — Telehealth: Payer: Self-pay | Admitting: *Deleted

## 2017-07-09 NOTE — Telephone Encounter (Signed)
Pt is scheduled for an xray and dental cleaning.  The dentist needs a letter stating that this is ok since she is pregnant.  This will need to be faxed to [phone]. Shirley [name], Shirley [name], CMA

## 2017-07-09 NOTE — Telephone Encounter (Signed)
Patient is seeing high risk OB. Please let them handle this. Thanks.  Durward Parcelavid McMullen, DO [facility], PGY-2

## 2017-07-10 NOTE — Telephone Encounter (Signed)
Pt informed. Shirley [name], CMA  

## 2017-07-14 ENCOUNTER — Encounter: Payer: Self-pay | Admitting: Obstetrics and Gynecology

## 2017-07-23 ENCOUNTER — Other Ambulatory Visit (HOSPITAL_COMMUNITY): Payer: Self-pay | Admitting: *Deleted

## 2017-07-23 ENCOUNTER — Encounter (HOSPITAL_COMMUNITY): Payer: Self-pay

## 2017-07-23 ENCOUNTER — Ambulatory Visit (HOSPITAL_COMMUNITY)
Admission: RE | Admit: 2017-07-23 | Discharge: 2017-07-23 | Disposition: A | Discharge status: Home / Self Care | Payer: Medicaid Other | Source: Ambulatory Visit | Attending: Obstetrics and Gynecology | Admitting: Obstetrics and Gynecology

## 2017-07-23 DIAGNOSIS — O163 Unspecified maternal hypertension, third trimester: Secondary | ICD-10-CM | POA: Diagnosis not present

## 2017-07-23 DIAGNOSIS — O10919 Unspecified pre-existing hypertension complicating pregnancy, unspecified trimester: Secondary | ICD-10-CM

## 2017-07-23 DIAGNOSIS — O99213 Obesity complicating pregnancy, third trimester: Secondary | ICD-10-CM | POA: Insufficient documentation

## 2017-07-23 DIAGNOSIS — I10 Essential (primary) hypertension: Secondary | ICD-10-CM

## 2017-07-23 DIAGNOSIS — Z3A32 32 weeks gestation of pregnancy: Secondary | ICD-10-CM | POA: Diagnosis not present

## 2017-07-23 DIAGNOSIS — E669 Obesity, unspecified: Secondary | ICD-10-CM | POA: Insufficient documentation

## 2017-07-23 DIAGNOSIS — Z6841 Body Mass Index (BMI) 40.0 and over, adult: Secondary | ICD-10-CM | POA: Diagnosis not present

## 2017-07-28 ENCOUNTER — Encounter: Payer: Self-pay | Admitting: Obstetrics & Gynecology

## 2017-07-30 ENCOUNTER — Encounter: Payer: Self-pay | Admitting: Advanced Practice Midwife

## 2017-08-20 ENCOUNTER — Ambulatory Visit (HOSPITAL_COMMUNITY)
Admission: RE | Admit: 2017-08-20 | Discharge: 2017-08-20 | Disposition: A | Discharge status: Home / Self Care | Payer: Medicaid Other | Source: Ambulatory Visit | Attending: Obstetrics and Gynecology | Admitting: Obstetrics and Gynecology

## 2017-08-20 ENCOUNTER — Encounter (HOSPITAL_COMMUNITY): Payer: Self-pay

## 2017-08-20 DIAGNOSIS — Z3A26 26 weeks gestation of pregnancy: Secondary | ICD-10-CM | POA: Diagnosis not present

## 2017-08-20 DIAGNOSIS — O10012 Pre-existing essential hypertension complicating pregnancy, second trimester: Secondary | ICD-10-CM | POA: Diagnosis not present

## 2017-08-20 DIAGNOSIS — I10 Essential (primary) hypertension: Secondary | ICD-10-CM

## 2017-08-26 ENCOUNTER — Ambulatory Visit: Payer: Self-pay | Admitting: Clinical

## 2017-08-26 ENCOUNTER — Ambulatory Visit (INDEPENDENT_AMBULATORY_CARE_PROVIDER_SITE_OTHER): Payer: Medicaid Other | Admitting: Obstetrics & Gynecology

## 2017-08-26 ENCOUNTER — Inpatient Hospital Stay (HOSPITAL_COMMUNITY)
Admission: AD | Admit: 2017-08-26 | Discharge: 2017-08-26 | Disposition: A | Discharge status: Home / Self Care | Payer: Medicaid Other | Source: Ambulatory Visit | Attending: Family Medicine | Admitting: Family Medicine

## 2017-08-26 ENCOUNTER — Encounter (HOSPITAL_COMMUNITY): Payer: Self-pay | Admitting: *Deleted

## 2017-08-26 VITALS — BP 172/116 | HR 82 | Wt 310.8 lb

## 2017-08-26 DIAGNOSIS — O0990 Supervision of high risk pregnancy, unspecified, unspecified trimester: Secondary | ICD-10-CM

## 2017-08-26 DIAGNOSIS — O26893 Other specified pregnancy related conditions, third trimester: Secondary | ICD-10-CM | POA: Diagnosis not present

## 2017-08-26 DIAGNOSIS — O10919 Unspecified pre-existing hypertension complicating pregnancy, unspecified trimester: Secondary | ICD-10-CM

## 2017-08-26 DIAGNOSIS — O10913 Unspecified pre-existing hypertension complicating pregnancy, third trimester: Secondary | ICD-10-CM | POA: Diagnosis not present

## 2017-08-26 DIAGNOSIS — F32A Depression, unspecified: Secondary | ICD-10-CM

## 2017-08-26 DIAGNOSIS — Z87891 Personal history of nicotine dependence: Secondary | ICD-10-CM | POA: Diagnosis not present

## 2017-08-26 DIAGNOSIS — R42 Dizziness and giddiness: Secondary | ICD-10-CM | POA: Diagnosis not present

## 2017-08-26 DIAGNOSIS — O163 Unspecified maternal hypertension, third trimester: Secondary | ICD-10-CM | POA: Diagnosis present

## 2017-08-26 DIAGNOSIS — F329 Major depressive disorder, single episode, unspecified: Secondary | ICD-10-CM

## 2017-08-26 DIAGNOSIS — F419 Anxiety disorder, unspecified: Secondary | ICD-10-CM

## 2017-08-26 LAB — CBC
HCT: 30.7 % — ABNORMAL LOW (ref 36.0–46.0)
Hemoglobin: 10.1 g/dL — ABNORMAL LOW (ref 12.0–15.0)
MCH: 27.1 pg (ref 26.0–34.0)
MCHC: 32.9 g/dL (ref 30.0–36.0)
MCV: 82.3 fL (ref 78.0–100.0)
PLATELETS: 268 10*3/uL (ref 150–400)
RBC: 3.73 MIL/uL — ABNORMAL LOW (ref 3.87–5.11)
RDW: 13.8 % (ref 11.5–15.5)
WBC: 8.7 10*3/uL (ref 4.0–10.5)

## 2017-08-26 LAB — COMPREHENSIVE METABOLIC PANEL
ALBUMIN: 2.4 g/dL — AB (ref 3.5–5.0)
ALT: 10 U/L — ABNORMAL LOW (ref 14–54)
ANION GAP: 8 (ref 5–15)
AST: 14 U/L — AB (ref 15–41)
Alkaline Phosphatase: 117 U/L (ref 38–126)
BUN: 7 mg/dL (ref 6–20)
CHLORIDE: 103 mmol/L (ref 101–111)
CO2: 23 mmol/L (ref 22–32)
Calcium: 8.3 mg/dL — ABNORMAL LOW (ref 8.9–10.3)
Creatinine, Ser: 0.56 mg/dL (ref 0.44–1.00)
GFR calc Af Amer: >60 mL/min (ref >60)
GLUCOSE: 88 mg/dL (ref 65–99)
Potassium: 3.3 mmol/L — ABNORMAL LOW (ref 3.5–5.1)
Sodium: 134 mmol/L — ABNORMAL LOW (ref 135–145)
Total Bilirubin: 0.3 mg/dL (ref 0.3–1.2)
Total Protein: 7.1 g/dL (ref 6.5–8.1)

## 2017-08-26 LAB — PROTEIN / CREATININE RATIO, URINE: CREATININE, URINE: 97 mg/dL

## 2017-08-26 MED ORDER — LABETALOL HCL 5 MG/ML IV SOLN
20.0000 mg | INTRAVENOUS | Status: DC | PRN
  Filled 2017-08-26: qty 4

## 2017-08-26 MED ORDER — HYDRALAZINE HCL 20 MG/ML IJ SOLN: 10.0000 mg | Freq: Once | INTRAMUSCULAR | Status: DC | PRN

## 2017-08-26 MED ORDER — LABETALOL HCL 200 MG PO TABS: 200.0000 mg | ORAL_TABLET | Freq: Two times a day (BID) | ORAL | 1 refills | Status: DC

## 2017-08-26 NOTE — Discharge Instructions (Signed)

## 2017-08-26 NOTE — MAU Note (Signed)
Pt called, not in lobby 

## 2017-08-26 NOTE — BH Specialist Note (Deleted)
Integrated Behavioral Health Follow Up Visit  MRN: [id number] Name: Shirley [name]  Number of Integrated Behavioral Health Clinician visits: 2/6 Session Start time: ***  Session End time: *** Total time: {IBH Total Time:21014050}  Type of Service: Integrated Behavioral Health- Individual/Family Interpretor:No. Interpretor Name and Language: n/a  SUBJECTIVE: Shirley [name] is a 27 y.o. female accompanied by n/a Patient was referred by Dr Debroah LoopArnold for depression and anxiety. Patient reports the following symptoms/concerns: *** Duration of problem: ***; Severity of problem: {Mild/Moderate/Severe:20260}  OBJECTIVE: Mood: {BHH MOOD:22306} and Affect: {BHH AFFECT:22307} Risk of harm to self or others: {CHL AMB BH Suicide Current Mental Status:21022748}  LIFE CONTEXT: Family and Social: *** School/Work: *** Self-Care: *** Life Changes: Current pregnancy ***  GOALS ADDRESSED: Patient will: 1.  Reduce symptoms of: {IBH Symptoms:21014056}  2.  Increase knowledge and/or ability of: {IBH Patient Tools:21014057}  3.  Demonstrate ability to: {IBH Goals:21014053}  INTERVENTIONS: Interventions utilized:  {IBH Interventions:21014054} Standardized Assessments completed: {IBH Screening Tools:21014051}  ASSESSMENT: Patient currently experiencing ***.   Patient may benefit from continued brief therapeutic interventions regarding coping with ***.  PLAN: 1. Follow up with behavioral health clinician on : *** 2. Behavioral recommendations:  - (walk outside daily, music daily, fam justice) *** 3. Referral(s): {IBH Referrals:21014055} 4. "From scale of 1-10, how likely are you to follow plan?": ***  Rae LipsJamie C Laronda Lisby, LCSW  Depression screen [facility] 2/9 08/26/2017 06/04/2017 05/07/2017 04/04/2017 12/17/2016  Decreased Interest 1 3 2  0 0  Down, Depressed, Hopeless 2 2 1  0 0  PHQ - 2 Score 3 5 3  0 0  Altered sleeping 2 2 1  - -  Tired, decreased energy 2 3 2  - -  Change in appetite 1 1 2  - -   Feeling bad or failure about yourself  1 1 1  - -  Trouble concentrating 0 0 0 - -  Moving slowly or fidgety/restless 0 0 0 - -  Suicidal thoughts 1 0 0 - -  PHQ-9 Score 10 12 9  - -   GAD 7 : Generalized Anxiety Score 08/26/2017 06/04/2017 05/07/2017  Nervous, Anxious, on Edge 2 1 0  Control/stop worrying 2 2 0  Worry too much - different things 2 2 0  Trouble relaxing 2 2 1   Restless 1 2 0  Easily annoyed or irritable 2 1 1   Afraid - awful might happen 2 0 0  Total GAD 7 Score 13 10 2

## 2017-08-26 NOTE — Progress Notes (Signed)
Pt has been having headaches, "hot and faint" Needs to see Asher MuirJamie, Patient and/or legal guardian verbally consented to meet with Behavioral Health Clinician about presenting concerns.

## 2017-08-26 NOTE — MAU Note (Signed)
Pt sent up from clinic, pt states she feels hot & faint anytime she gets up & moves around, unable to cook or go up stairs.  States it has been going on entire pregnancy.  Denies HA or visual changes, "I feel fine except for being dizzy.

## 2017-08-26 NOTE — Progress Notes (Signed)
   PRENATAL VISIT NOTE  Subjective:  Shirley [name] is a 27 y.o. [phone] at [redacted]w[redacted]d being seen today for ongoing prenatal care.  She is currently monitored for the following issues for this high-risk pregnancy and has Obesity, Class III, BMI 40-49.9 (morbid obesity) (HCC); Primary hypertension; Submandibular abscess; Low back pain with right-sided sciatica; Spinal stenosis of lumbar region with radiculopathy; Osteoarthritis of both knees; Frequent No-show for appointment; Supervision of high risk pregnancy, antepartum; Chronic hypertension during pregnancy, antepartum; Obesity complicating pregnancy; and GBS bacteriuria on their problem list.  Patient reports fatigue and dizzy.  Contractions: Not present. Vag. Bleeding: None.  Movement: Present. Denies leaking of fluid.   The following portions of the patient's history were reviewed and updated as appropriate: allergies, current medications, past family history, past medical history, past social history, past surgical history and problem list. Problem list updated.  Objective:   Vitals:   08/26/17 1116  BP: (!) 172/116  Pulse: 82  Weight: (!) 310 lb 12.8 oz (141 kg)    Fetal Status: Fetal Heart Rate (bpm): 138   Movement: Present     General:  Alert, oriented and cooperative. Patient is in no acute distress.  Skin: Skin is warm and dry. No rash noted.   Cardiovascular: Normal heart rate noted  Respiratory: Normal respiratory effort, no problems with respiration noted  Abdomen: Soft, gravid, appropriate for gestational age.  Pain/Pressure: Present     Pelvic: Cervical exam deferred        Extremities: Normal range of motion.  Edema: None  Mental Status:  Normal mood and affect. Normal behavior. Normal judgment and thought content.   Assessment and Plan:  Pregnancy: [id number] at [redacted]w[redacted]d  1. Anxiety and depression  - Ambulatory referral to Integrated Behavioral Health  2. Supervision of high risk pregnancy, antepartum Poor  compliance  3. Chronic hypertension during pregnancy, antepartum Uncontrolled BP  Preterm labor symptoms and general obstetric precautions including but not limited to vaginal bleeding, contractions, leaking of fluid and fetal movement were reviewed in detail with the patient. Please refer to After Visit Summary for other counseling recommendations.  Return in about 1 week (around 09/02/2017). To MAU for evaluation and BP management  Scheryl DarterJames Arnold, MD

## 2017-08-26 NOTE — Progress Notes (Deleted)
Integrated Behavioral Health Follow Up Visit  MRN: [id number] Name: Arlyss RepressSacoya M Taves  Number of Integrated Behavioral Health Clinician visits: {IBH Number of Visits:21014052} Session Start time: ***  Session End time: *** Total time: {IBH Total Time:21014050}  Type of Service: Integrated Behavioral Health- Individual/Family Interpretor:{yes EX:[id number]}no:[id number]} Interpretor Name and Language: ***  SUBJECTIVE: Arlyss RepressSacoya M Mes[place]ita is a 27 y.o. female accompanied by {Patient accompanied by:[phone]} Patient was referred by *** for ***. Patient reports the following symptoms/concerns: *** Duration of problem: ***; Severity of problem: {Mild/Moderate/Severe:20260}  OBJECTIVE: Mood: {BHH MOOD:22306} and Affect: {BHH AFFECT:22307} Risk of harm to self or others: {CHL AMB BH Suicide Current Mental Status:21022748}  LIFE CONTEXT: Family and Social: *** School/Work: *** Self-Care: *** Life Changes: ***  GOALS ADDRESSED: Patient will: 1.  Reduce symptoms of: {IBH Symptoms:21014056}  2.  Increase knowledge and/or ability of: {IBH Patient Tools:21014057}  3.  Demonstrate ability to: {IBH Goals:21014053}  INTERVENTIONS: Interventions utilized:  {IBH Interventions:21014054} Standardized Assessments completed: {IBH Screening Tools:21014051}  ASSESSMENT: Patient currently experiencing ***.   Patient may benefit from ***.  PLAN: 1. Follow up with behavioral health clinician on : *** 2. Behavioral recommendations: *** 3. Referral(s): {IBH Referrals:21014055} 4. "From scale of 1-10, how likely are you to follow plan?": ***  Valetta CloseJamie C McMannes, LCSW

## 2017-08-26 NOTE — Patient Instructions (Signed)

## 2017-08-26 NOTE — MAU Provider Note (Signed)
Patient Shirley [name] is a 27 y.o. [phone] At [redacted]w[redacted]d here after being seen in the clinic for a HROB. Patient is a known chronic hypertensive on labetalol, however, she is non-compliant. She has also missed several appointments in January, although she was seen in the MAU for syncope on 07-08-2017.    History     CSN: [id number]  Arrival date and time: 08/26/17 1157   None     Chief Complaint  Patient presents with  . Hypertension  . Dizziness   Hypertension  This is a new problem. The current episode started more than 1 month ago. The problem is unchanged. Pertinent negatives include no chest pain or headaches. Past treatments include beta blockers.  Dizziness  This is a chronic problem. The problem has been waxing and waning. Associated symptoms include a fever. Pertinent negatives include no chest pain or headaches.    OB History    Gravida Para Term Preterm AB Living   4 2 2   1 2    SAB TAB Ectopic Multiple Live Births     1     2      Past Medical History:  Diagnosis Date  . Arthritis   . Hypertension   . Obesity   . Sciatica     Past Surgical History:  Procedure Laterality Date  . TOOTH EXTRACTION N/A 10/22/2013   Procedure: DENTAL EXTRACTIONS TEETH #1, 16, 17, 32;  Surgeon: Georgia Lopes, DDS;  Location: [facility] OR;  Service: Oral Surgery;  Laterality: N/A;    Family History  Problem Relation Age of Onset  . Hypertension Mother   . Diabetes Maternal Grandmother   . Diabetes Paternal Grandmother   . Stroke Paternal Grandmother     Social History   Tobacco Use  . Smoking status: Former Smoker    Packs/day: 0.50    Types: Cigarettes    Last attempt to quit: 01/29/2017    Years since quitting: 0.5  . Smokeless tobacco: Never Used  Substance Use Topics  . Alcohol use: No    Alcohol/week: 0.0 oz    Frequency: Never    Comment: rare  . Drug use: No    Comment: daily usage    Allergies: No Known Allergies  Medications Prior to Admission  Medication Sig  Dispense Refill Last Dose  . aspirin EC 81 MG tablet Take 1 tablet (81 mg total) by mouth daily. (Patient not taking: Reported on 08/26/2017) 30 tablet 3 Not Taking  . cyclobenzaprine (FLEXERIL) 10 MG tablet Take 1 tablet (10 mg total) by mouth 2 (two) times daily as needed for muscle spasms. (Patient not taking: Reported on 05/16/2017) 20 tablet 0 Not Taking  . famotidine (PEPCID AC) 10 MG chewable tablet Chew 1 tablet (10 mg total) 2 (two) times daily by mouth. (Patient not taking: Reported on 05/16/2017) 60 tablet 0 Not Taking  . ondansetron (ZOFRAN) 4 MG tablet Take 1 tablet (4 mg total) by mouth every 8 (eight) hours as needed for nausea or vomiting. (Patient not taking: Reported on 05/16/2017) 20 tablet 0 Not Taking  . pregabalin (LYRICA) 75 MG capsule Take 1 capsule (75 mg total) by mouth 2 (two) times daily. Start with 1 tab at bedtime and slowly increase to 1 tablet two  times daily (Patient not taking: Reported on 05/16/2017) 30 capsule 0 Not Taking  . Prenat-Methylfol-Chol-Fish Oil (PRENATAL + COMPLETE MULTI) 0.267 & 373 MG THPK Take 1 tablet by mouth daily. 120 each 2 Taking  Review of Systems  Constitutional: Positive for fever.  HENT: Negative.   Respiratory: Negative.   Cardiovascular: Negative.  Negative for chest pain.  Gastrointestinal: Negative.   Genitourinary: Negative.   Musculoskeletal: Negative.   Neurological: Positive for dizziness. Negative for headaches.  Hematological: Negative.   Psychiatric/Behavioral: Negative.    Physical Exam   Blood pressure 137/86, pulse 74, temperature 98.1 F (36.7 C), temperature source Oral, resp. rate 18, last menstrual period 02/15/2017.  Physical Exam  Constitutional: She appears well-developed.  HENT:  Head: Normocephalic.  Eyes: Pupils are equal, round, and reactive to light.  Neck: Normal range of motion.  GI: Soft.  Musculoskeletal: Normal range of motion.  Neurological: She is alert.  Skin: Skin is warm and dry.   Psychiatric: She has a normal mood and affect.    MAU Course  Procedures  MDM -NST: 140 with mod var, 10 x 10, present acel, neg decels and no contractions.   -Pre- E labs: CBC, CMP  And PRC are normal  -Serial BP; one elevated pressure (162/107) but others are between 120-130/80-90s.   Denies HA, epigastric pain, sudden swelling.   Vitals:   08/26/17 1232 08/26/17 1247 08/26/17 1300 08/26/17 1326  BP: (!) 133/96 137/86 121/88 139/90  Pulse: 76 74 85 77  Resp:      Temp:      TempSrc:       Patient says that she will start taking her labatalol and aspirin, and that she will make a prenatal visit. She plans to keep her appts now that she has transportation.   Growth US from 08-20-2017 shows appropriate interval growth; next US for 09-17-2017.  Assessment and Plan   1. Chronic hypertension complicating or reason for care during pregnancy, third trimester    2. Patient stable for discharge with plans to keep appt on 3-8.   3. Reviewed in detail with patient the importance of keeping her ob appointments, and that she will need a 2 hour glucose test, labs, etc ASAP.   4. Warning signs of pre-e, decreased fetal movements, bleeding or leaking of fluid and when to return to MAU reviewed.   5. Patient verbalized understanding; agrees with plan of care.   Charlesetta GaribaldiKathryn Lorraine Kaneshia Cater 08/26/2017, 12:55 PM

## 2017-08-27 ENCOUNTER — Ambulatory Visit (INDEPENDENT_AMBULATORY_CARE_PROVIDER_SITE_OTHER): Payer: Medicaid Other | Admitting: Clinical

## 2017-08-27 DIAGNOSIS — F4323 Adjustment disorder with mixed anxiety and depressed mood: Secondary | ICD-10-CM

## 2017-08-27 NOTE — BH Specialist Note (Signed)
Integrated Behavioral Health Follow Up Visit  MRN: [id number] Name: Shirley [name]  Number of Integrated Behavioral Health Clinician visits: 2/6 Session Start time: 11:41  Session End time: 12:40 Total time: 1 hour  Type of Service: Integrated Behavioral Health- Individual/Family Interpretor:No. Interpretor Name and Language: n/a  SUBJECTIVE: Shirley RepressSacoya M Johns is a 27 y.o. female accompanied by n/a Patient was referred by Dr Debroah LoopArnold for anxiety and depression. Patient reports the following symptoms/concerns: Pt states her primary concern today is fear concerning her escalating blood pressure, along with depression, fatigue, sleep difficulty, worry, anxiety, irritability, along with life stress with transportation and legal issues. Pt says it helps the most to be able to talk through her feelings. She has [place] [facility], but transportation has made it difficult to make it to her last appointment.  Duration of problem: Increase in pregnancy; Severity of problem: moderately severe  OBJECTIVE: Mood: Anxious and Depressed and Affect: Appropriate and Tearful Risk of harm to self or others: No plan to harm self or others  LIFE CONTEXT: Family and Social: Pt lives with her 2 children(11,8); family is supportive School/Work: Actively looking for work Self-Care: Tree surgeonesiliency and keeping a positive outlook Life Changes: Current pregnancy, loss of potential job; increase in hypertention/blood pressure  GOALS ADDRESSED: Patient will: 1.  Reduce symptoms of: anxiety, depression and stress  2.  Increase knowledge and/or ability of: stress reduction  3.  Demonstrate ability to: Increase healthy adjustment to current life circumstances  INTERVENTIONS: Interventions utilized:  Solution-Focused Strategies Standardized Assessments completed: GAD-7 and PHQ 9  ASSESSMENT: Patient currently experiencing Adjustment disorder with mixed anxious and depressed mood and Psychosocial  stress.  Patient may benefit from continued brief therapeutic interventions regarding coping with symptoms of anxiety and depression.  PLAN: 1. Follow up with behavioral health clinician on : Two weeks 2. Behavioral recommendations:  -Request medical records at front office  -Consider [facility] for ongoing therapy prior to childbirth -Consider applying for Medicaid transportation And [facility], for continued transportation 3. Referral(s): Integrated Art gallery managerBehavioral Health Services (In Clinic), Community [facility] Services (LME/Outside Clinic) and [employer] Resources:  Transportation and [facility] 4. "From scale of 1-10, how likely are you to follow plan?": 8  Rae LipsJamie C Asar Evilsizer, LCSW  Depression screen [facility] 2/9 08/26/2017 06/04/2017 05/07/2017 04/04/2017 12/17/2016  Decreased Interest 1 3 2  0 0  Down, Depressed, Hopeless 2 2 1  0 0  PHQ - 2 Score 3 5 3  0 0  Altered sleeping 2 2 1  - -  Tired, decreased energy 2 3 2  - -  Change in appetite 1 1 2  - -  Feeling bad or failure about yourself  1 1 1  - -  Trouble concentrating 0 0 0 - -  Moving slowly or fidgety/restless 0 0 0 - -  Suicidal thoughts 1 0 0 - -  PHQ-9 Score 10 12 9  - -   GAD 7 : Generalized Anxiety Score 08/26/2017 06/04/2017 05/07/2017  Nervous, Anxious, on Edge 2 1 0  Control/stop worrying 2 2 0  Worry too much - different things 2 2 0  Trouble relaxing 2 2 1   Restless 1 2 0  Easily annoyed or irritable 2 1 1   Afraid - awful might happen 2 0 0  Total GAD 7 Score 13 10 2

## 2017-08-28 ENCOUNTER — Encounter (HOSPITAL_COMMUNITY): Payer: Self-pay | Admitting: *Deleted

## 2017-08-28 ENCOUNTER — Inpatient Hospital Stay (HOSPITAL_COMMUNITY)
Admission: AD | Admit: 2017-08-28 | Discharge: 2017-08-29 | Disposition: A | Discharge status: Home / Self Care | Payer: Medicaid Other | Source: Ambulatory Visit | Attending: Obstetrics and Gynecology | Admitting: Obstetrics and Gynecology

## 2017-08-28 DIAGNOSIS — Z3A27 27 weeks gestation of pregnancy: Secondary | ICD-10-CM | POA: Insufficient documentation

## 2017-08-28 DIAGNOSIS — M545 Low back pain: Secondary | ICD-10-CM | POA: Insufficient documentation

## 2017-08-28 DIAGNOSIS — O10012 Pre-existing essential hypertension complicating pregnancy, second trimester: Secondary | ICD-10-CM | POA: Insufficient documentation

## 2017-08-28 DIAGNOSIS — G8929 Other chronic pain: Secondary | ICD-10-CM | POA: Diagnosis not present

## 2017-08-28 DIAGNOSIS — Z9119 Patient's noncompliance with other medical treatment and regimen: Secondary | ICD-10-CM

## 2017-08-28 DIAGNOSIS — O10919 Unspecified pre-existing hypertension complicating pregnancy, unspecified trimester: Secondary | ICD-10-CM

## 2017-08-28 DIAGNOSIS — R42 Dizziness and giddiness: Secondary | ICD-10-CM

## 2017-08-28 DIAGNOSIS — R55 Syncope and collapse: Secondary | ICD-10-CM | POA: Diagnosis present

## 2017-08-28 DIAGNOSIS — Z91199 Patient's noncompliance with other medical treatment and regimen due to unspecified reason: Secondary | ICD-10-CM

## 2017-08-28 DIAGNOSIS — Z9114 Patient's other noncompliance with medication regimen: Secondary | ICD-10-CM | POA: Diagnosis not present

## 2017-08-28 DIAGNOSIS — M5441 Lumbago with sciatica, right side: Secondary | ICD-10-CM

## 2017-08-28 DIAGNOSIS — R0989 Other specified symptoms and signs involving the circulatory and respiratory systems: Secondary | ICD-10-CM

## 2017-08-28 LAB — URINALYSIS, ROUTINE W REFLEX MICROSCOPIC
BILIRUBIN URINE: NEGATIVE
Glucose, UA: NEGATIVE mg/dL
HGB URINE DIPSTICK: NEGATIVE
Ketones, ur: NEGATIVE mg/dL
Leukocytes, UA: NEGATIVE
NITRITE: NEGATIVE
PROTEIN: NEGATIVE mg/dL
Specific Gravity, Urine: 1.029 (ref 1.005–1.030)
pH: 5 (ref 5.0–8.0)

## 2017-08-28 MED ORDER — SODIUM CHLORIDE 0.9 % IV SOLN
INTRAVENOUS | Status: DC
  Administered 2017-08-29: via INTRAVENOUS

## 2017-08-28 MED ORDER — CYCLOBENZAPRINE HCL 10 MG PO TABS
10.0000 mg | ORAL_TABLET | Freq: Once | ORAL | Status: AC
  Administered 2017-08-29: 10 mg via ORAL
  Filled 2017-08-28: qty 1

## 2017-08-28 MED ORDER — LABETALOL HCL 5 MG/ML IV SOLN: 20.0000 mg | INTRAVENOUS | Status: DC | PRN

## 2017-08-28 MED ORDER — HYDRALAZINE HCL 20 MG/ML IJ SOLN: 10.0000 mg | Freq: Once | INTRAMUSCULAR | Status: DC | PRN

## 2017-08-28 MED ORDER — LABETALOL HCL 200 MG PO TABS: 200.0000 mg | ORAL_TABLET | Freq: Two times a day (BID) | ORAL | 1 refills | Status: DC

## 2017-08-28 NOTE — MAU Provider Note (Signed)
Chief Complaint:  Hypertension   First Provider Initiated Contact with Patient 08/28/17 2324     HPI: Shirley [name] is a 27 y.o. [id number] at 50w5dwho presents to maternity admissions reporting passing out at home this evening.  States has been getting out of breath more easily lately, even with minimal exertion.  States gets "hot flashes" then feels like she will pass out, so sits down.     Doesn't work.  Upon chart review, has had these episodes over the past 10 years or more.  Also c/o right lower back pain with radiation down leg.  Upon chart review, this is a longstanding problem over many years also, evaluated by her primary doctors.  She reports good fetal movement, denies LOF, vaginal bleeding, vaginal itching/burning, urinary symptoms, h/a, dizziness, n/v, diarrhea, constipation or fever/chills.  Has a headache now.   Has long term chronic hypertension.  Blood pressure is somewhat labile of late, normal one minute then severe hypertension the next.  Was evaluated for the same thing two days ago. Noncompliant for many years   Doesn't take her medications and misses many appointments. States it is " a problem with insurance" and a "problem with transportation".     Hypertension  This is a chronic problem. The problem has been waxing and waning since onset. The problem is uncontrolled. Associated symptoms include headaches and malaise/fatigue. Pertinent negatives include no anxiety, blurred vision, chest pain, palpitations, peripheral edema or sweats. There are no associated agents to hypertension. Compliance problems include exercise, medication cost and psychosocial issues.     RN Note: PT SAYS SHE PASSED  OUT   AT 930PM-  IN HER HOUSE -  GOING UIP STAIRS -  HER MOM AND KIDS WERE THERE.  Marland Kitchen HAPPENED-  ALSO IN NOV 2018-   SAYS BC HER BP GETS ELEVATED.Marland Kitchen    SAYS SHE WORKED TOO HARD  TODAY.    [facility] CLINIC.    WAS HERE ON Tuesday   FOR BP-  SUPPOSE TO TAKE MEDS -  SAYS [facility]ACY- [facility]  [address] -  SAYS THEY NEVER RECEIVED  A RX .         Past Medical History: Past Medical History:  Diagnosis Date  . Arthritis   . Hypertension   . Obesity   . Sciatica     Past obstetric history: OB History  Gravida Para Term Preterm AB Living  4 2 2   1 2   SAB TAB Ectopic Multiple Live Births    1     2    # Outcome Date GA Lbr Len/2nd Weight Sex Delivery Anes PTL Lv  4 Current           3 TAB 08/01/09          2 Term 05/19/09 [redacted]w[redacted]d  6 lb 2 oz (2.778 kg) F Vag-Spont EPI N LIV  1 Term 07/31/05 [redacted]w[redacted]d  6 lb 4 oz (2.835 kg) F Vag-Spont EPI N LIV     Complications: Shoulder Dystocia,Preeclampsia      Past Surgical History: Past Surgical History:  Procedure Laterality Date  . TOOTH EXTRACTION N/A 10/22/2013   Procedure: DENTAL EXTRACTIONS TEETH #1, 16, 17, 32;  Surgeon: Georgia Lopes, DDS;  Location: [facility] OR;  Service: Oral Surgery;  Laterality: N/A;    Family History: Family History  Problem Relation Age of Onset  . Hypertension Mother   . Diabetes Maternal Grandmother   . Diabetes Paternal Grandmother   .  Stroke Paternal Grandmother     Social History: Social History   Tobacco Use  . Smoking status: Former Smoker    Packs/day: 0.50    Types: Cigarettes    Last attempt to quit: 01/29/2017    Years since quitting: 0.5  . Smokeless tobacco: Never Used  Substance Use Topics  . Alcohol use: No    Alcohol/week: 0.0 oz    Frequency: Never    Comment: rare  . Drug use: No    Comment: daily usage    Allergies: No Known Allergies  Meds:  Medications Prior to Admission  Medication Sig Dispense Refill Last Dose  . aspirin EC 81 MG tablet Take 1 tablet (81 mg total) by mouth daily. (Patient not taking: Reported on 08/26/2017) 30 tablet 3 Not Taking  . famotidine (PEPCID AC) 10 MG chewable tablet Chew 1 tablet (10 mg total) 2 (two) times daily by mouth. (Patient not taking: Reported on 05/16/2017) 60 tablet 0 Not Taking  . ondansetron (ZOFRAN) 4 MG tablet Take 1  tablet (4 mg total) by mouth every 8 (eight) hours as needed for nausea or vomiting. (Patient not taking: Reported on 05/16/2017) 20 tablet 0 Not Taking  . Prenat-Methylfol-Chol-Fish Oil (PRENATAL + COMPLETE MULTI) 0.267 & 373 MG THPK Take 1 tablet by mouth daily. 120 each 2 Taking    I have reviewed patient's Past Medical Hx, Surgical Hx, Family Hx, Social Hx, medications and allergies.   ROS:  Review of Systems  Constitutional: Positive for malaise/fatigue.  Eyes: Negative for blurred vision.  Cardiovascular: Negative for chest pain and palpitations.  Neurological: Positive for headaches.   Other systems negative  Physical Exam   Initial Orthostatic Vital signs:  Lying 121/75 79 Sitting123/83 92 Stand 95/70 120  Patient Vitals for the past 24 hrs:  BP Temp Temp src Pulse Resp SpO2 Height Weight  08/28/17 2331 (!) 128/112 - - 98 - - - -  08/28/17 2316 117/77 - - 92 - - - -  08/28/17 2309 - - - - - 99 % - -  08/28/17 2304 130/78 - - (!) 103 - 94 % - -  08/28/17 2303 - - - - - 99 % - -  08/28/17 2258 - - - - - 100 % - -  08/28/17 2240 (!) 151/109 98 F (36.7 C) Oral 87 20 - 5\' 6"  (1.676 m) (!) 309 lb 4 oz (140.3 kg)  123/83  145/89 108/79  147/89  121/75  131/79 133/77  147/89  143/123 159/96   Orthostatic Vital signs after IV fluids were normal  Constitutional: Well-developed, well-nourished female in no acute distress.  Cardiovascular: normal rate and rhythm Respiratory: normal effort, clear to auscultation bilaterally GI: Abd soft, non-tender, gravid appropriate for gestational age.   No rebound or guarding. MS: Extremities nontender, no edema, normal ROM Neurologic: Alert and oriented x 4.  GU: Neg CVAT.  PELVIC EXAM: deferred  FHT:  Baseline 140 , moderate variability, small accelerations present, no decelerations Contractions:  Rare   Labs: O/Positive/-- (10/05 1559) Results for orders placed or performed during the hospital encounter of 08/28/17 (from the  past 24 hour(s))  Urinalysis, Routine w reflex microscopic     Status: Abnormal   Collection Time: 08/28/17 10:42 PM  Result Value Ref Range   Color, Urine YELLOW YELLOW   APPearance HAZY (A) CLEAR   Specific Gravity, Urine 1.029 1.005 - 1.030   pH 5.0 5.0 - 8.0   Glucose, UA NEGATIVE NEGATIVE mg/dL  Hgb urine dipstick NEGATIVE NEGATIVE   Bilirubin Urine NEGATIVE NEGATIVE   Ketones, ur NEGATIVE NEGATIVE mg/dL   Protein, ur NEGATIVE NEGATIVE mg/dL   Nitrite NEGATIVE NEGATIVE   Leukocytes, UA NEGATIVE NEGATIVE  Protein / creatinine ratio, urine     Status: None   Collection Time: 08/28/17 10:42 PM  Result Value Ref Range   Creatinine, Urine 364.00 mg/dL   Total Protein, Urine 14 mg/dL   Protein Creatinine Ratio 0.04 0.00 - 0.15 mg/mg[Cre]  Urine rapid drug screen (hosp performed)     Status: None   Collection Time: 08/28/17 10:42 PM  Result Value Ref Range   Opiates NONE DETECTED NONE DETECTED   Cocaine NONE DETECTED NONE DETECTED   Benzodiazepines NONE DETECTED NONE DETECTED   Amphetamines NONE DETECTED NONE DETECTED   Tetrahydrocannabinol NONE DETECTED NONE DETECTED   Barbiturates NONE DETECTED NONE DETECTED  CBC     Status: Abnormal   Collection Time: 08/29/17 12:05 AM  Result Value Ref Range   WBC 11.7 (H) 4.0 - 10.5 K/uL   RBC 3.87 3.87 - 5.11 MIL/uL   Hemoglobin 10.6 (L) 12.0 - 15.0 g/dL   HCT 16.131.7 (L) 09.636.0 - 04.546.0 %   MCV 81.9 78.0 - 100.0 fL   MCH 27.4 26.0 - 34.0 pg   MCHC 33.4 30.0 - 36.0 g/dL   RDW 40.913.7 81.111.5 - 91.415.5 %   Platelets 272 150 - 400 K/uL  Comprehensive metabolic panel     Status: Abnormal   Collection Time: 08/29/17 12:05 AM  Result Value Ref Range   Sodium 135 135 - 145 mmol/L   Potassium 3.5 3.5 - 5.1 mmol/L   Chloride 106 101 - 111 mmol/L   CO2 21 (L) 22 - 32 mmol/L   Glucose, Bld 103 (H) 65 - 99 mg/dL   BUN 8 6 - 20 mg/dL   Creatinine, Ser 7.820.71 0.44 - 1.00 mg/dL   Calcium 8.5 (L) 8.9 - 10.3 mg/dL   Total Protein 6.4 (L) 6.5 - 8.1 g/dL    Albumin 2.3 (L) 3.5 - 5.0 g/dL   AST 16 15 - 41 U/L   ALT 12 (L) 14 - 54 U/L   Alkaline Phosphatase 120 38 - 126 U/L   Total Bilirubin 0.5 0.3 - 1.2 mg/dL   GFR calc non Af Amer >60 >60 mL/min   GFR calc Af Amer >60 >60 mL/min   Anion gap 8 5 - 15    Imaging:    MAU Course/MDM: I have ordered labs and reviewed results. Labs are normal with norma Pr/Cr ratio NST reviewed, reassuring for gestational age  Treatments in MAU included IV hydration Orthostatic vital signs were significant before hydration and normal afterward Pt treated with Flexeril for back pain with relief  Blood pressures throughout evaluation have been remarkably LABILE Some severe followed by a normal range Hypertensive protocol ordered with first severe range pressure but med held due to next BP being in normal range.  After hydration, her BPs settled out in the 140s/80s range.  I have sent a message to clinic to set up Cardiology referral due to labile hypertension and reportedly more severe intolerance of activity. Patient states she gets exhausted and dizzy/near syncopal with only a few steps now, worse than ever before.   Encouraged to take meds.  States pharmacy told her they did not have Rx but they do.  I resent Rx again today to her pharmacy..    Assessment: 1. Chronic hypertension during pregnancy, antepartum  2.      Labile blood pressures 3.     Noncompliance with meds and visits 4.      Chronic low back pain 5.     Near syncope due to orthostatic changes in vital signs  Plan: Discharge home Encouraged proper hydration Fall precautions Take meds as prescribed Preterm Labor precautions and fetal kick counts Follow up in Office for prenatal visits and recheck Referral to Cardiology to rule out cardiomyopathy (though no complaints of chest pain today)  Encouraged to return here or to other Urgent Care/ED if she develops worsening of symptoms, increase in pain, fever, or other concerning  symptoms.  Pt stable at time of discharge.  Wynelle Bourgeois CNM, MSN Certified Nurse-Midwife 08/28/2017 11:53 PM

## 2017-08-28 NOTE — MAU Note (Signed)
PT SAYS SHE PASSED  OUT   AT 930PM-  IN HER HOUSE -  GOING UIP STAIRS -  HER MOM AND KIDS WERE THERE.  Marland Kitchen. HAPPENED-  ALSO IN NOV 2018-   SAYS BC HER BP GETS ELEVATED.Marland Kitchen.    SAYS SHE WORKED TOO HARD  TODAY.    [facility].    WAS HERE ON Tuesday   FOR BP-  SUPPOSE TO TAKE MEDS -  SAYS HER [facility]ACY- [facility] [address] -  SAYS THEY NEVER RECEIVED  A RX .

## 2017-08-29 DIAGNOSIS — R55 Syncope and collapse: Secondary | ICD-10-CM

## 2017-08-29 LAB — CBC
HEMATOCRIT: 31.7 % — AB (ref 36.0–46.0)
Hemoglobin: 10.6 g/dL — ABNORMAL LOW (ref 12.0–15.0)
MCH: 27.4 pg (ref 26.0–34.0)
MCHC: 33.4 g/dL (ref 30.0–36.0)
MCV: 81.9 fL (ref 78.0–100.0)
PLATELETS: 272 10*3/uL (ref 150–400)
RBC: 3.87 MIL/uL (ref 3.87–5.11)
RDW: 13.7 % (ref 11.5–15.5)
WBC: 11.7 10*3/uL — AB (ref 4.0–10.5)

## 2017-08-29 LAB — COMPREHENSIVE METABOLIC PANEL
ALT: 12 U/L — AB (ref 14–54)
AST: 16 U/L (ref 15–41)
Albumin: 2.3 g/dL — ABNORMAL LOW (ref 3.5–5.0)
Alkaline Phosphatase: 120 U/L (ref 38–126)
Anion gap: 8 (ref 5–15)
BILIRUBIN TOTAL: 0.5 mg/dL (ref 0.3–1.2)
BUN: 8 mg/dL (ref 6–20)
CO2: 21 mmol/L — ABNORMAL LOW (ref 22–32)
CREATININE: 0.71 mg/dL (ref 0.44–1.00)
Calcium: 8.5 mg/dL — ABNORMAL LOW (ref 8.9–10.3)
Chloride: 106 mmol/L (ref 101–111)
Glucose, Bld: 103 mg/dL — ABNORMAL HIGH (ref 65–99)
Potassium: 3.5 mmol/L (ref 3.5–5.1)
Sodium: 135 mmol/L (ref 135–145)
TOTAL PROTEIN: 6.4 g/dL — AB (ref 6.5–8.1)

## 2017-08-29 LAB — RAPID URINE DRUG SCREEN, HOSP PERFORMED
Amphetamines: NOT DETECTED
BARBITURATES: NOT DETECTED
Benzodiazepines: NOT DETECTED
Cocaine: NOT DETECTED
Opiates: NOT DETECTED
TETRAHYDROCANNABINOL: NOT DETECTED

## 2017-08-29 LAB — PROTEIN / CREATININE RATIO, URINE
CREATININE, URINE: 364 mg/dL
Protein Creatinine Ratio: 0.04 mg/mg{Cre} (ref 0.00–0.15)
TOTAL PROTEIN, URINE: 14 mg/dL

## 2017-08-29 MED ORDER — CYCLOBENZAPRINE HCL 10 MG PO TABS: 10.0000 mg | ORAL_TABLET | Freq: Three times a day (TID) | ORAL | 2 refills | Status: DC | PRN

## 2017-08-29 NOTE — Progress Notes (Signed)
Pt given all discharge instructions including education regarding hypertension in pregnancy.  Patient indicated understanding of all information.

## 2017-08-29 NOTE — Discharge Instructions (Signed)
Hypertension During Pregnancy Hypertension is also called high blood pressure. High blood pressure means that the force of your blood moving in your body is too strong. When you are pregnant, this condition should be watched carefully. It can cause problems for you and your baby. Follow these instructions at home: Eating and drinking  Drink enough fluid to keep your pee (urine) clear or pale yellow.  Eat healthy foods that are low in salt (sodium). ? Do not add salt to your food. ? Check labels on foods and drinks to see much salt is in them. Look on the label where you see "Sodium." Lifestyle  Do not use any products that contain nicotine or tobacco, such as cigarettes and e-cigarettes. If you need help quitting, ask your doctor.  Do not use alcohol.  Avoid caffeine.  Avoid stress. Rest and get plenty of sleep. General instructions  Take over-the-counter and prescription medicines only as told by your doctor.  While lying down, lie on your left side. This keeps pressure off your baby.  While sitting or lying down, raise (elevate) your feet. Try putting some pillows under your lower legs.  Exercise regularly. Ask your doctor what kinds of exercise are best for you.  Keep all prenatal and follow-up visits as told by your doctor. This is important. Contact a doctor if:  You have symptoms that your doctor told you to watch for, such as: ? Fever. ? Throwing up (vomiting). ? Headache. Get help right away if:  You have very bad pain in your belly (abdomen).  You are throwing up, and this does not get better with treatment.  You suddenly get swelling in your hands, ankles, or face.  You gain 4 lb (1.8 kg) or more in 1 week.  You get bleeding from your vagina.  You have blood in your pee.  You do not feel your baby moving as much as normal.  You have a change in vision.  You have muscle twitching or sudden tightening (spasms).  You have trouble breathing.  Your lips  or fingernails turn blue. This information is not intended to replace advice given to you by your health care provider. Make sure you discuss any questions you have with your health care provider. Document Released: 07/20/2010 Document Revised: 02/27/2016 Document Reviewed: 02/27/2016 Elsevier Interactive Patient Education  2018 [employer].  Orthostatic Hypotension Orthostatic hypotension is a sudden drop in blood pressure that happens when you quickly change positions, such as when you get up from a seated or lying position. Blood pressure is a measurement of how strongly, or weakly, your blood is pressing against the walls of your arteries. Arteries are blood vessels that carry blood from your heart throughout your body. When blood pressure is too low, you may not get enough blood to your brain or to the rest of your organs. This can cause weakness, light-headedness, rapid heartbeat, and fainting. This can last for just a few seconds or for up to a few minutes. Orthostatic hypotension is usually not a serious problem. However, if it happens frequently or gets worse, it may be a sign of something more serious. What are the causes? This condition may be caused by:  Sudden changes in posture, such as standing up quickly after you have been sitting or lying down.  Blood loss.  Loss of body fluids (dehydration).  Heart problems.  Hormone (endocrine) problems.  Pregnancy.  Severe infection.  Lack of certain nutrients.  Severe allergic reactions (anaphylaxis).  Certain medicines,  such as blood pressure medicine or medicines that make the body lose excess fluids (diuretics). Sometimes, this condition can be caused by not taking medicine as directed, such as taking too much of a certain medicine.  What increases the risk? Certain factors can make you more likely to develop orthostatic hypotension, including:  Age. Risk increases as you get older.  Conditions that affect the heart or  the central nervous system.  Taking certain medicines, such as blood pressure medicine or diuretics.  Being pregnant.  What are the signs or symptoms? Symptoms of this condition may include:  Weakness.  Light-headedness.  Dizziness.  Blurred vision.  Fatigue.  Rapid heartbeat.  Fainting, in severe cases.  How is this diagnosed? This condition is diagnosed based on:  Your medical history.  Your symptoms.  Your blood pressure measurement. Your health care provider will check your blood pressure when you are: ? Lying down. ? Sitting. ? Standing.  A blood pressure reading is recorded as two numbers, such as "120 over 80" (or 120/80). The first ("top") number is called the systolic pressure. It is a measure of the pressure in your arteries as your heart beats. The second ("bottom") number is called the diastolic pressure. It is a measure of the pressure in your arteries when your heart relaxes between beats. Blood pressure is measured in a unit called mm Hg. Healthy blood pressure for adults is 120/80. If your blood pressure is below 90/60, you may be diagnosed with hypotension. Other information or tests that may be used to diagnose orthostatic hypotension include:  Your other vital signs, such as your heart rate and temperature.  Blood tests.  Tilt table test. For this test, you will be safely secured to a table that moves you from a lying position to an upright position. Your heart rhythm and blood pressure will be monitored during the test.  How is this treated? Treatment for this condition may include:  Changing your diet. This may involve eating more salt (sodium) or drinking more water.  Taking medicines to raise your blood pressure.  Changing the dosage of certain medicines you are taking that might be lowering your blood pressure.  Wearing compression stockings. These stockings help to prevent blood clots and reduce swelling in your legs.  In some cases, you  may need to go to the hospital for:  Fluid replacement. This means you will receive fluids through an IV tube.  Blood replacement. This means you will receive donated blood through an IV tube (transfusion).  Treating an infection or heart problems, if this applies.  Monitoring. You may need to be monitored while medicines that you are taking wear off.  Follow these instructions at home: Eating and drinking   Drink enough fluid to keep your urine clear or pale yellow.  Eat a healthy diet and follow instructions from your health care provider about eating or drinking restrictions. A healthy diet includes: ? Fresh fruits and vegetables. ? Whole grains. ? Lean meats. ? Low-fat dairy products.  Eat extra salt only as directed. Do not add extra salt to your diet unless your health care provider told you to do that.  Eat frequent, small meals.  Avoid standing up suddenly after eating. Medicines  Take over-the-counter and prescription medicines only as told by your health care provider. ? Follow instructions from your health care provider about changing the dosage of your current medicines, if this applies. ? Do not stop or adjust any of your medicines on your  own. General instructions  Wear compression stockings as told by your health care provider.  Get up slowly from lying down or sitting positions. This gives your blood pressure a chance to adjust.  Avoid hot showers and excessive heat as directed by your health care provider.  Return to your normal activities as told by your health care provider. Ask your health care provider what activities are safe for you.  Do not use any products that contain nicotine or tobacco, such as cigarettes and e-cigarettes. If you need help quitting, ask your health care provider.  Keep all follow-up visits as told by your health care provider. This is important. Contact a health care provider if:  You vomit.  You have diarrhea.  You have  a fever for more than 2-3 days.  You feel more thirsty than usual.  You feel weak and tired. Get help right away if:  You have chest pain.  You have a fast or irregular heartbeat.  You develop numbness in any part of your body.  You cannot move your arms or your legs.  You have trouble speaking.  You become sweaty or feel lightheaded.  You faint.  You feel short of breath.  You have trouble staying awake.  You feel confused. This information is not intended to replace advice given to you by your health care provider. Make sure you discuss any questions you have with your health care provider. Document Released: 06/07/2002 Document Revised: 03/05/2016 Document Reviewed: 12/08/2015 Elsevier Interactive Patient Education  2018 [employer].

## 2017-08-29 NOTE — Progress Notes (Signed)
Per CNM Williams, hold Labetalol due to labile Bps.   Pt diagnosed with chronic hypertension. Evaluating for orthostatic hypotension.

## 2017-09-01 ENCOUNTER — Other Ambulatory Visit: Payer: Self-pay

## 2017-09-02 NOTE — Progress Notes (Signed)
Error

## 2017-09-05 ENCOUNTER — Encounter: Payer: Self-pay | Admitting: Family Medicine

## 2017-09-10 ENCOUNTER — Encounter: Payer: Self-pay | Admitting: Obstetrics & Gynecology

## 2017-09-12 ENCOUNTER — Other Ambulatory Visit: Payer: Self-pay

## 2017-09-12 ENCOUNTER — Encounter (HOSPITAL_COMMUNITY): Payer: Self-pay | Admitting: Emergency Medicine

## 2017-09-12 ENCOUNTER — Emergency Department (HOSPITAL_COMMUNITY)
Admission: EM | Admit: 2017-09-12 | Discharge: 2017-09-12 | Disposition: A | Discharge status: Home / Self Care | Payer: Medicaid Other | Attending: Emergency Medicine | Admitting: Emergency Medicine

## 2017-09-12 DIAGNOSIS — O99613 Diseases of the digestive system complicating pregnancy, third trimester: Secondary | ICD-10-CM | POA: Diagnosis not present

## 2017-09-12 DIAGNOSIS — O219 Vomiting of pregnancy, unspecified: Secondary | ICD-10-CM | POA: Diagnosis present

## 2017-09-12 DIAGNOSIS — Z3A31 31 weeks gestation of pregnancy: Secondary | ICD-10-CM | POA: Diagnosis not present

## 2017-09-12 DIAGNOSIS — R197 Diarrhea, unspecified: Secondary | ICD-10-CM | POA: Insufficient documentation

## 2017-09-12 DIAGNOSIS — Z5321 Procedure and treatment not carried out due to patient leaving prior to being seen by health care provider: Secondary | ICD-10-CM | POA: Insufficient documentation

## 2017-09-12 LAB — COMPREHENSIVE METABOLIC PANEL
ALBUMIN: 2.4 g/dL — AB (ref 3.5–5.0)
ALK PHOS: 138 U/L — AB (ref 38–126)
ALT: 10 U/L — ABNORMAL LOW (ref 14–54)
ANION GAP: 9 (ref 5–15)
AST: 17 U/L (ref 15–41)
BILIRUBIN TOTAL: 0.6 mg/dL (ref 0.3–1.2)
BUN: 6 mg/dL (ref 6–20)
CALCIUM: 8.9 mg/dL (ref 8.9–10.3)
CO2: 20 mmol/L — ABNORMAL LOW (ref 22–32)
Chloride: 105 mmol/L (ref 101–111)
Creatinine, Ser: 0.69 mg/dL (ref 0.44–1.00)
Glucose, Bld: 83 mg/dL (ref 65–99)
POTASSIUM: 3.4 mmol/L — AB (ref 3.5–5.1)
Sodium: 134 mmol/L — ABNORMAL LOW (ref 135–145)
TOTAL PROTEIN: 6.8 g/dL (ref 6.5–8.1)

## 2017-09-12 LAB — CBC WITH DIFFERENTIAL/PLATELET
BASOS ABS: 0 10*3/uL (ref 0.0–0.1)
BASOS PCT: 0 %
EOS ABS: 0.1 10*3/uL (ref 0.0–0.7)
Eosinophils Relative: 1 %
HEMATOCRIT: 32.1 % — AB (ref 36.0–46.0)
HEMOGLOBIN: 10.2 g/dL — AB (ref 12.0–15.0)
Lymphocytes Relative: 24 %
Lymphs Abs: 2.2 10*3/uL (ref 0.7–4.0)
MCH: 26.2 pg (ref 26.0–34.0)
MCHC: 31.8 g/dL (ref 30.0–36.0)
MCV: 82.5 fL (ref 78.0–100.0)
MONOS PCT: 5 %
Monocytes Absolute: 0.5 10*3/uL (ref 0.1–1.0)
NEUTROS ABS: 6.6 10*3/uL (ref 1.7–7.7)
NEUTROS PCT: 70 %
Platelets: 289 10*3/uL (ref 150–400)
RBC: 3.89 MIL/uL (ref 3.87–5.11)
RDW: 13.7 % (ref 11.5–15.5)
WBC: 9.4 10*3/uL (ref 4.0–10.5)

## 2017-09-12 LAB — LIPASE, BLOOD: LIPASE: 22 U/L (ref 11–51)

## 2017-09-12 NOTE — ED Notes (Signed)
Pt did not answer for repeat VS.

## 2017-09-12 NOTE — ED Notes (Signed)
Pt called x3 with no response  

## 2017-09-12 NOTE — ED Notes (Signed)
Patient is [redacted] weeks pregnant.  

## 2017-09-12 NOTE — ED Triage Notes (Signed)
Pt reports daughter is diagnosed with GI virus. Pt states since yesterday they both have been throwing up and having diarrhea. Pt also having cramping. Denies painful urination or vaginal symptoms.

## 2017-09-12 NOTE — ED Provider Notes (Signed)
Patient placed in Quick Look pathway, seen and evaluated   Chief Complaint: Nausea vomiting and diarrhea  HPI:   Patient with nausea vomiting diarrhea for the past 2 days.  She is also currently [redacted] weeks pregnant.  She was asked by her OB/GYN to come and be checked.  Her daughter has the same symptoms and was diagnosed with a food borne illness.  She had a neighbor who also had this.  Apparently came from eating some watermelon that was bad.  ROS: Positive for nausea abdominal cramping no current diarrhea (one)  Physical Exam:   Gen: No distress  Neuro: Awake and Alert  Skin: Warm    Focused Exam: Well-appearing, benign abdominal exam   Initiation of care has begun. The patient has been counseled on the process, plan, and necessity for staying for the completion/evaluation, and the remainder of the medical screening examination    Arthor CaptainHarris, Bobbie Valletta, PA-C 09/12/17 2100    Nira Connardama, Pedro Eduardo, MD 10/16/17 1054

## 2017-09-12 NOTE — ED Notes (Signed)
Unable to locate pt in lobby x 2. °

## 2017-09-17 ENCOUNTER — Ambulatory Visit (INDEPENDENT_AMBULATORY_CARE_PROVIDER_SITE_OTHER): Payer: Medicaid Other | Admitting: Advanced Practice Midwife

## 2017-09-17 ENCOUNTER — Other Ambulatory Visit (HOSPITAL_COMMUNITY): Payer: Self-pay | Admitting: Obstetrics and Gynecology

## 2017-09-17 ENCOUNTER — Encounter (HOSPITAL_COMMUNITY): Payer: Self-pay

## 2017-09-17 ENCOUNTER — Ambulatory Visit (HOSPITAL_COMMUNITY)
Admission: RE | Admit: 2017-09-17 | Discharge: 2017-09-17 | Disposition: A | Discharge status: Home / Self Care | Payer: Medicaid Other | Source: Ambulatory Visit | Attending: Obstetrics and Gynecology | Admitting: Obstetrics and Gynecology

## 2017-09-17 VITALS — BP 143/84 | HR 86 | Wt 311.2 lb

## 2017-09-17 DIAGNOSIS — Z362 Encounter for other antenatal screening follow-up: Secondary | ICD-10-CM

## 2017-09-17 DIAGNOSIS — O99213 Obesity complicating pregnancy, third trimester: Secondary | ICD-10-CM

## 2017-09-17 DIAGNOSIS — O10013 Pre-existing essential hypertension complicating pregnancy, third trimester: Secondary | ICD-10-CM

## 2017-09-17 DIAGNOSIS — Z3A30 30 weeks gestation of pregnancy: Secondary | ICD-10-CM | POA: Diagnosis not present

## 2017-09-17 DIAGNOSIS — R0600 Dyspnea, unspecified: Secondary | ICD-10-CM

## 2017-09-17 DIAGNOSIS — I10 Essential (primary) hypertension: Secondary | ICD-10-CM

## 2017-09-17 DIAGNOSIS — O0990 Supervision of high risk pregnancy, unspecified, unspecified trimester: Secondary | ICD-10-CM

## 2017-09-17 DIAGNOSIS — O10919 Unspecified pre-existing hypertension complicating pregnancy, unspecified trimester: Secondary | ICD-10-CM

## 2017-09-17 LAB — POCT URINALYSIS DIP (DEVICE)
GLUCOSE, UA: NEGATIVE mg/dL
Hgb urine dipstick: NEGATIVE
Ketones, ur: NEGATIVE mg/dL
Leukocytes, UA: NEGATIVE
NITRITE: NEGATIVE
PROTEIN: NEGATIVE mg/dL
SPECIFIC GRAVITY, URINE: 1.025 (ref 1.005–1.030)
UROBILINOGEN UA: 0.2 mg/dL (ref 0.0–1.0)
pH: 6 (ref 5.0–8.0)

## 2017-09-17 NOTE — Patient Instructions (Signed)

## 2017-09-17 NOTE — Addendum Note (Signed)
Encounter addended by: Vivien RotaSmall, Bianna Haran H, RT on: 09/17/2017 12:37 PM  Actions taken: Imaging Exam ended

## 2017-09-17 NOTE — Progress Notes (Signed)
Notified patient of her cardiology appt.

## 2017-09-17 NOTE — Progress Notes (Signed)
   PRENATAL VISIT NOTE  Subjective:  Shirley [name] is a 27 y.o. [phone] at 7018w4d being seen today for ongoing prenatal care.  She is currently monitored for the following issues for this high-risk pregnancy and has Obesity, Class III, BMI 40-49.9 (morbid obesity) (HCC); Primary hypertension; Low back pain with right-sided sciatica; Spinal stenosis of lumbar region with radiculopathy; Osteoarthritis of both knees; Frequent No-show for appointment; Supervision of high risk pregnancy, antepartum; Chronic hypertension during pregnancy, antepartum; Obesity complicating pregnancy; GBS bacteriuria; and Effort syncope on their problem list.  Patient reports fatigue and occassional episodes of dizziness, SOB and lightheadedness when active. None now. Checks her BP and it goes as low as 90's/60's.  Was seen in MAU a few weeks ago for this was. On Labetalol 200 BID and referred to Cardiology, but didn't get a call or try to call anyone about it. Sx slightly better since MAU visit. BP very labile.  Denies HA, vision changes or epigastric pain.  Contractions: Not present. Vag. Bleeding: None.  Movement: Present. Denies leaking of fluid.   The following portions of the patient's history were reviewed and updated as appropriate: allergies, current medications, past family history, past medical history, past social history, past surgical history and problem list. Problem list updated.  Objective:   Vitals:   09/17/17 1026 09/17/17 1033  BP: (!) 155/99 (!) 143/84  Pulse: (!) 112 86  Weight: (!) 311 lb 3.2 oz (141.2 kg)     Fetal Status: Fetal Heart Rate (bpm): 143   Movement: Present     General:  Alert, oriented and cooperative. Patient is in no acute distress.  Skin: Skin is warm and dry. No rash noted.   Cardiovascular: Normal heart rate noted  Respiratory: Normal respiratory effort, no problems with respiration noted  Abdomen: Soft, gravid, appropriate for gestational age.  Pain/Pressure: Absent       Pelvic: Cervical exam deferred        Extremities: Normal range of motion.     Mental Status:  Normal mood and affect. Normal behavior. Normal judgment and thought content.   Assessment and Plan:  Pregnancy: [id number] at 3518w4d  1. Chronic hypertension during pregnancy, antepartum--Possibly orthostatic Sx. Hesitant to drop BP meds lower due to pt still at high end of acceptable BPs.   - Glucose Tolerance, 1 Hour - RPR - HIV antibody - CBC - Comprehensive metabolic panel - Protein / creatinine ratio, urine - Referral to cardiology-May need Echo for SOB - Encouraged to change position slowly.   2. Supervision of high risk pregnancy, antepartum  - Glucose Tolerance, 1 Hour (Pt not fasting) - RPR - HIV antibody  Preterm labor symptoms and general obstetric precautions including but not limited to vaginal bleeding, contractions, leaking of fluid and fetal movement were reviewed in detail with the patient. Please refer to After Visit Summary for other counseling recommendations.  Return in about 9 days (around 09/26/2017) for ROB/BPP.     Dorathy KinsmanVirginia Jiyan Walkowski, CNM

## 2017-09-18 ENCOUNTER — Ambulatory Visit: Payer: Medicaid Other | Admitting: Cardiovascular Disease

## 2017-09-18 LAB — PROTEIN / CREATININE RATIO, URINE
CREATININE, UR: 194 mg/dL
PROTEIN/CREAT RATIO: 97 mg/g{creat} (ref 0–200)
Protein, Ur: 18.9 mg/dL

## 2017-09-18 LAB — COMPREHENSIVE METABOLIC PANEL
A/G RATIO: 0.9 — AB (ref 1.2–2.2)
ALK PHOS: 145 IU/L — AB (ref 39–117)
ALT: 8 IU/L (ref 0–32)
AST: 10 IU/L (ref 0–40)
Albumin: 2.9 g/dL — ABNORMAL LOW (ref 3.5–5.5)
BUN / CREAT RATIO: 6 — AB (ref 9–23)
BUN: 4 mg/dL — ABNORMAL LOW (ref 6–20)
CHLORIDE: 104 mmol/L (ref 96–106)
CO2: 19 mmol/L — ABNORMAL LOW (ref 20–29)
Calcium: 8.7 mg/dL (ref 8.7–10.2)
Creatinine, Ser: 0.66 mg/dL (ref 0.57–1.00)
GFR calc non Af Amer: 121 mL/min/{1.73_m2} (ref >59)
GFR, EST AFRICAN AMERICAN: 139 mL/min/{1.73_m2} (ref >59)
GLUCOSE: 109 mg/dL — AB (ref 65–99)
Globulin, Total: 3.3 g/dL (ref 1.5–4.5)
Potassium: 3.2 mmol/L — ABNORMAL LOW (ref 3.5–5.2)
Sodium: 139 mmol/L (ref 134–144)
TOTAL PROTEIN: 6.2 g/dL (ref 6.0–8.5)

## 2017-09-18 LAB — CBC
Hematocrit: 31.7 % — ABNORMAL LOW (ref 34.0–46.6)
Hemoglobin: 10.6 g/dL — ABNORMAL LOW (ref 11.1–15.9)
MCH: 27 pg (ref 26.6–33.0)
MCHC: 33.4 g/dL (ref 31.5–35.7)
MCV: 81 fL (ref 79–97)
PLATELETS: 272 10*3/uL (ref 150–379)
RBC: 3.93 x10E6/uL (ref 3.77–5.28)
RDW: 14.1 % (ref 12.3–15.4)
WBC: 8.3 10*3/uL (ref 3.4–10.8)

## 2017-09-18 LAB — RPR: RPR: NONREACTIVE

## 2017-09-18 LAB — GLUCOSE TOLERANCE, 1 HOUR: GLUCOSE, 1HR PP: 105 mg/dL (ref 65–199)

## 2017-09-18 LAB — HIV ANTIBODY (ROUTINE TESTING W REFLEX): HIV Screen 4th Generation wRfx: NONREACTIVE

## 2017-09-19 ENCOUNTER — Encounter: Payer: Self-pay | Admitting: *Deleted

## 2017-09-26 ENCOUNTER — Ambulatory Visit (INDEPENDENT_AMBULATORY_CARE_PROVIDER_SITE_OTHER): Payer: Medicaid Other | Admitting: Obstetrics & Gynecology

## 2017-09-26 ENCOUNTER — Other Ambulatory Visit: Payer: Medicaid Other

## 2017-09-26 VITALS — BP 158/102 | HR 88 | Wt 317.7 lb

## 2017-09-26 DIAGNOSIS — O0990 Supervision of high risk pregnancy, unspecified, unspecified trimester: Secondary | ICD-10-CM

## 2017-09-26 DIAGNOSIS — O10919 Unspecified pre-existing hypertension complicating pregnancy, unspecified trimester: Secondary | ICD-10-CM

## 2017-09-26 LAB — POCT URINALYSIS DIP (DEVICE)
BILIRUBIN URINE: NEGATIVE
GLUCOSE, UA: NEGATIVE mg/dL
Hgb urine dipstick: NEGATIVE
KETONES UR: NEGATIVE mg/dL
Leukocytes, UA: NEGATIVE
NITRITE: NEGATIVE
PH: 6.5 (ref 5.0–8.0)
Protein, ur: NEGATIVE mg/dL
Specific Gravity, Urine: 1.02 (ref 1.005–1.030)
Urobilinogen, UA: 0.2 mg/dL (ref 0.0–1.0)

## 2017-09-26 MED ORDER — LABETALOL HCL 200 MG PO TABS: 400.0000 mg | ORAL_TABLET | Freq: Two times a day (BID) | ORAL | 2 refills | Status: DC

## 2017-09-26 NOTE — Patient Instructions (Signed)
Return to clinic for any scheduled appointments or obstetric concerns, or go to MAU for evaluation  

## 2017-09-26 NOTE — Addendum Note (Signed)
Addended by: Jill SideAY, DIANE L on: 09/26/2017 01:40 PM   Modules accepted: Orders

## 2017-09-26 NOTE — Progress Notes (Signed)
PRENATAL VISIT NOTE  Subjective:  Shirley [name] is a 27 y.o. [phone] at [redacted]w[redacted]d being seen today for ongoing prenatal care.  She is currently monitored for the following issues for this high-risk pregnancy and has Obesity, Class III, BMI 40-49.9 (morbid obesity) (HCC); Primary hypertension; Low back pain with right-sided sciatica; Spinal stenosis of lumbar region with radiculopathy; Osteoarthritis of both knees; Frequent No-show for appointment; Supervision of high risk pregnancy, antepartum; Chronic hypertension during pregnancy, antepartum; Maternal morbid obesity, antepartum (HCC); GBS bacteriuria; and Effort syncope on their problem list.  Patient reports no complaints; denies headaches, visual symptoms or RUQ/epigastric pain.   Contractions: Not present. Vag. Bleeding: None.  Movement: Present. Denies leaking of fluid.   The following portions of the patient's history were reviewed and updated as appropriate: allergies, current medications, past family history, past medical history, past social history, past surgical history and problem list. Problem list updated.  Objective:   Vitals:   09/26/17 1138 09/26/17 1150  BP: (!) 166/126 (!) 158/102  Pulse: 88   Weight: (!) 317 lb 11.2 oz (144.1 kg)     Fetal Status: Fetal Heart Rate (bpm): 135   Movement: Present     General:  Alert, oriented and cooperative. Patient is in no acute distress.  Skin: Skin is warm and dry. No rash noted.   Cardiovascular: Normal heart rate noted  Respiratory: Normal respiratory effort, no problems with respiration noted  Abdomen: Soft, gravid, appropriate for gestational age.  Pain/Pressure: Present     Pelvic: Cervical exam deferred        Extremities: Normal range of motion.  Edema: None  Mental Status:  Normal mood and affect. Normal behavior. Normal judgment and thought content.    [place] [facility] Ob Follow Up  Result Date: 09/17/2017 ----------------------------------------------------------------------   OBSTETRICS REPORT                      (Signed Final 09/17/2017 12:11 pm) ---------------------------------------------------------------------- Patient Info  ID #:       [id number]                          D.O.B.:  18-Aug-1990 (26 yrs)  Name:       Shirley [name]                Visit Date: 09/17/2017 11:30 am ---------------------------------------------------------------------- Performed By  Performed By:     Vivien Rota        Secondary Phy.:   [facility] for                                                             [facility]  [facility]care  Attending:        Charlsie MerlesMark Newman MD         Address[facility]          [facility]                                                             [facility]                                                             [address]                                                             [place], [place]                                                             [id number]  Referred By:      Gigi GinPEGGY                  [facility]                    CONSTANT MD  Ref. Address:     Faculty ---------------------------------------------------------------------- Orders   #  Description                                 Code   1  US MFM OB FOLLOW UP                         [id number]  ----------------------------------------------------------------------   #  Ordered By               Order #        Accession #    Episode #   1  JEFFREY Marjo BickerENNEY           [id number]      [id number]     [id number]  ---------------------------------------------------------------------- Indications   [redacted] weeks gestation of pregnancy                Z3A.30   Hypertension - Chronic/Pre-existing            O10.019   Encounter for other antenatal screening  Z36.2   follow-up   Obesity  complicating pregnancy, third          O99.213   trimester (BMI-48.1)  ---------------------------------------------------------------------- OB History  Blood Type:            Height:  5'7"   Weight (lb):  295       BMI:  46.2  Gravidity:    4         Term:   2  TOP:          1        Living:  2 ---------------------------------------------------------------------- Fetal Evaluation  Num Of Fetuses:     1  Fetal Heart         138  Rate(bpm):  Cardiac Activity:   Observed  Presentation:       Cephalic  Placenta:           Fundal, above cervical os  P. Cord Insertion:  Previously Visualized  Amniotic Fluid  AFI FV:      Subjectively within normal limits  AFI Sum(cm)     %Tile       Largest Pocket(cm)  17.44           65          5.86  RUQ(cm)       RLQ(cm)       LUQ(cm)        LLQ(cm)  4.15          5.86          2.74           4.69 ---------------------------------------------------------------------- Biometry  BPD:      78.4  mm     G. Age:  31w 3d         67  %    CI:        75.93   %    70 - 86                                                          FL/HC:      20.9   %    19.3 - 21.3  HC:      285.2  mm     G. Age:  31w 2d         35  %    HC/AC:      1.03        0.96 - 1.17  AC:       276   mm     G. Age:  31w 5d         77  %    FL/BPD:     76.1   %    71 - 87  FL:       59.7  mm     G. Age:  31w 1d         51  %    FL/AC:      21.6   %    20 - 24  HUM:        54  mm     G. Age:  31w 3d         69  %  Est. FW:    1767  gm    3 lb 14 oz  71  % ---------------------------------------------------------------------- Gestational Age  LMP:           30w 4d        Date:  02/15/17                 EDD:   11/22/17  U/S Today:     31w 3d                                        EDD:   11/16/17  Best:          30w 4d     Det. By:  LMP  (02/15/17)          EDD:   11/22/17 ---------------------------------------------------------------------- Anatomy  Cranium:               Appears normal         Aortic Arch:             Previously seen  Cavum:                 Previously seen        Ductal Arch:            Previously seen  Ventricles:            Appears normal         Diaphragm:              Previously seen  Choroid Plexus:        Previously seen        Stomach:                Appears normal, left                                                                        sided  Cerebellum:            Previously seen        Abdomen:                Appears normal  Posterior Fossa:       Previously seen        Abdominal Wall:         Previously seen  Nuchal Fold:           Previously seen        Cord Vessels:           Previously seen  Face:                  Orbits and profile     Kidneys:                Appear normal                         previously seen  Lips:                  Previously seen        Bladder:                Appears normal  Thoracic:  Appears normal         Spine:                  Previously seen  Heart:                 Previously seen        Upper Extremities:      Previously seen  RVOT:                  Previously seen        Lower Extremities:      Previously seen  LVOT:                  Previously seen  Other:  Female gender previously seen. Heels and 5th digit previously          visualized. Technically difficult due to maternal habitus. ---------------------------------------------------------------------- Cervix Uterus Adnexa  Cervix  Not visualized (advanced GA >29wks)  Uterus  No abnormality visualized.  Left Ovary  Not visualized.  Right Ovary  Not visualized.  Adnexa:       No abnormality visualized. No adnexal mass                visualized. ---------------------------------------------------------------------- Impression  Singleton intrauterine pregnancy at 30+4 weeks with [facility]  here for growth evaluation  Interval review of the anatomy shows no sonographic  markers for aneuploidy or structural anomalies  All relevant fetal anatomy has been visualized  Amniotic fluid volume is normal  Estimated  fetal weight shows growth in the 71st percentile ---------------------------------------------------------------------- Recommendations  Will recheck growth in 4 weeks  Patient should begin antepartum fetal testing by 32 weeks  and continue through delivery ----------------------------------------------------------------------                 Charlsie Merles, MD Electronically Signed Final Report   09/17/2017 12:11 pm ----------------------------------------------------------------------   Assessment and Plan:  Pregnancy: [id number] at [redacted]w[redacted]d  1. Chronic hypertension during pregnancy, antepartum Multiple MAU visits with negative evaluation. No symptoms today. Increased Labetalol to 400 mg bid.  Great FM. Will start antenatal testing soon. Labs checked today.  Preeclampsia precautions advised.  - labetalol (NORMODYNE) 200 MG tablet; Take 2 tablets (400 mg total) by mouth 2 (two) times daily.  Dispense: 120 tablet; Refill: 2 - CBC - Comprehensive metabolic panel - Protein / creatinine ratio, urine  2. Supervision of high risk pregnancy, antepartum Preterm labor symptoms and general obstetric precautions including but not limited to vaginal bleeding, contractions, leaking of fluid and fetal movement were reviewed in detail with the patient. Please refer to After Visit Summary for other counseling recommendations.  Return in about 1 week (around 10/03/2017) for NST and [facility] weekly - can be anytime next week.   Jaynie Collins, MD

## 2017-09-27 LAB — COMPREHENSIVE METABOLIC PANEL
ALBUMIN: 2.9 g/dL — AB (ref 3.5–5.5)
ALK PHOS: 150 IU/L — AB (ref 39–117)
ALT: 7 IU/L (ref 0–32)
AST: 8 IU/L (ref 0–40)
Albumin/Globulin Ratio: 0.8 — ABNORMAL LOW (ref 1.2–2.2)
BUN / CREAT RATIO: 8 — AB (ref 9–23)
BUN: 5 mg/dL — ABNORMAL LOW (ref 6–20)
CHLORIDE: 103 mmol/L (ref 96–106)
CO2: 24 mmol/L (ref 20–29)
Calcium: 8.7 mg/dL (ref 8.7–10.2)
Creatinine, Ser: 0.64 mg/dL (ref 0.57–1.00)
GFR calc Af Amer: 140 mL/min/{1.73_m2} (ref >59)
GFR calc non Af Amer: 122 mL/min/{1.73_m2} (ref >59)
GLOBULIN, TOTAL: 3.6 g/dL (ref 1.5–4.5)
GLUCOSE: 107 mg/dL — AB (ref 65–99)
POTASSIUM: 3.6 mmol/L (ref 3.5–5.2)
SODIUM: 137 mmol/L (ref 134–144)
Total Protein: 6.5 g/dL (ref 6.0–8.5)

## 2017-09-27 LAB — CBC
HEMATOCRIT: 32.7 % — AB (ref 34.0–46.6)
HEMOGLOBIN: 10.4 g/dL — AB (ref 11.1–15.9)
MCH: 26.2 pg — AB (ref 26.6–33.0)
MCHC: 31.8 g/dL (ref 31.5–35.7)
MCV: 82 fL (ref 79–97)
Platelets: 268 10*3/uL (ref 150–379)
RBC: 3.97 x10E6/uL (ref 3.77–5.28)
RDW: 14.3 % (ref 12.3–15.4)
WBC: 8.5 10*3/uL (ref 3.4–10.8)

## 2017-09-27 LAB — PROTEIN / CREATININE RATIO, URINE
CREATININE, UR: 125.9 mg/dL
Protein, Ur: 15.4 mg/dL
Protein/Creat Ratio: 122 mg/g creat (ref 0–200)

## 2017-10-03 ENCOUNTER — Ambulatory Visit: Payer: Medicaid Other | Admitting: Cardiovascular Disease

## 2017-10-06 ENCOUNTER — Ambulatory Visit (INDEPENDENT_AMBULATORY_CARE_PROVIDER_SITE_OTHER): Payer: Medicaid Other | Admitting: Family Medicine

## 2017-10-06 ENCOUNTER — Ambulatory Visit (INDEPENDENT_AMBULATORY_CARE_PROVIDER_SITE_OTHER): Payer: Medicaid Other | Admitting: *Deleted

## 2017-10-06 ENCOUNTER — Encounter: Payer: Self-pay | Admitting: Cardiovascular Disease

## 2017-10-06 ENCOUNTER — Ambulatory Visit: Payer: Self-pay

## 2017-10-06 VITALS — BP 158/102 | HR 105 | Wt 318.3 lb

## 2017-10-06 DIAGNOSIS — O10913 Unspecified pre-existing hypertension complicating pregnancy, third trimester: Secondary | ICD-10-CM

## 2017-10-06 DIAGNOSIS — O10919 Unspecified pre-existing hypertension complicating pregnancy, unspecified trimester: Secondary | ICD-10-CM

## 2017-10-06 DIAGNOSIS — O0990 Supervision of high risk pregnancy, unspecified, unspecified trimester: Secondary | ICD-10-CM

## 2017-10-06 LAB — POCT URINALYSIS DIP (DEVICE)
GLUCOSE, UA: NEGATIVE mg/dL
HGB URINE DIPSTICK: NEGATIVE
Leukocytes, UA: NEGATIVE
Nitrite: NEGATIVE
Protein, ur: NEGATIVE mg/dL
SPECIFIC GRAVITY, URINE: 1.025 (ref 1.005–1.030)
Urobilinogen, UA: 2 mg/dL — ABNORMAL HIGH (ref 0.0–1.0)
pH: 5.5 (ref 5.0–8.0)

## 2017-10-06 MED ORDER — NIFEDIPINE ER OSMOTIC RELEASE 30 MG PO TB24: 30.0000 mg | ORAL_TABLET | Freq: Every day | ORAL | 2 refills | Status: DC

## 2017-10-06 NOTE — Progress Notes (Signed)
Pt denies H/A or visual disturbances.  

## 2017-10-06 NOTE — Progress Notes (Signed)

## 2017-10-06 NOTE — Patient Instructions (Addendum)
Places to have your son circumcised:    [facility] [phone] while you are in hospital   [phone] $175 by 2 wks  [facility] [phone] $269 by 7 days [facility] [phone] $[phone] by 4 wks  These prices sometimes change but are roughly what you can expect to pay. Please call and confirm pricing.   Circumcision is considered an elective/non-medically necessary procedure. There are many reasons parents decide to have their sons circumsized. During the first year of life circumcised males have a reduced risk of urinary tract infections but after this year the rates between circumcised males and uncircumcised males are the same.  It is safe to have your son circumcised outside of the hospital and the places above perform them regularly.   Deciding about Circumcision in Baby Boys  (Up-to-date The Basics)  What is circumcision?  Circumcision is a surgery that removes the skin that covers the tip of the penis, called the "foreskin" Circumcision is usually done when a boy is between 72 and 52 days old. In the [place], circumcision is common. In some other countries, fewer boys are circumcised. Circumcision is a common tradition in some religions.  Should I have my baby boy circumcised?  There is no easy answer. Circumcision has some benefits. But it also has risks. After talking with your doctor, you will have to decide for yourself what is right for your family.  What are the benefits of circumcision?  Circumcised boys seem to have slightly lower rates of: ?Urinary tract infections ?Swelling of the opening at the tip of the penis Circumcised men seem to have slightly lower rates of: ?Urinary tract infections ?Swelling of the opening at the tip of the penis ?Penis cancer ?HIV and other infections that you catch during  sex ?Cervical cancer in the women they have sex with Even so, in the [place], the risks of these problems are small - even in boys and men who have not been circumcised. Plus, boys and men who are not circumcised can reduce these extra risks by: ?Cleaning their penis well ?Using condoms during sex  What are the risks of circumcision?  Risks include: ?Bleeding or infection from the surgery ?Damage to or amputation of the penis ?A chance that the doctor will cut off too much or not enough of the foreskin ?A chance that sex won't feel as good later in life Only about 1 out of every 200 circumcisions leads to problems. There is also a chance that your health insurance won't pay for circumcision.  How is circumcision done in baby boys?  First, the baby gets medicine for pain relief. This might be a cream on the skin or a shot into the base of the penis. Next, the doctor cleans the baby's penis well. Then he or she uses special tools to cut off the foreskin. Finally, the doctor wraps a bandage (called gauze) around the baby's penis. If you have your baby circumcised, his doctor or nurse will give you instructions on how to care for him after the surgery. It is important that you follow those instructions carefully.   Breastfeeding Choosing to breastfeed is one of the best decisions you can make for yourself and your baby. A change in hormones during pregnancy causes your breasts to make breast milk in your milk-producing glands. Hormones prevent breast milk from being released before your baby is born. They also prompt milk flow after birth. Once breastfeeding has begun, thoughts of your baby, as well as his or  her sucking or crying, can stimulate the release of milk from your milk-producing glands. Benefits of breastfeeding Research shows that breastfeeding offers many health benefits for infants and mothers. It also offers a cost-free and convenient way to feed your baby. For your  baby  Your first milk (colostrum) helps your baby's digestive system to function better.  Special cells in your milk (antibodies) help your baby to fight off infections.  Breastfed babies are less likely to develop asthma, allergies, obesity, or type 2 diabetes. They are also at lower risk for sudden infant death syndrome (SIDS).  Nutrients in breast milk are better able to meet your baby's needs compared to infant formula.  Breast milk improves your baby's brain development. For you  Breastfeeding helps to create a very special bond between you and your baby.  Breastfeeding is convenient. Breast milk costs nothing and is always available at the correct temperature.  Breastfeeding helps to burn calories. It helps you to lose the weight that you gained during pregnancy.  Breastfeeding makes your uterus return faster to its size before pregnancy. It also slows bleeding (lochia) after you give birth.  Breastfeeding helps to lower your risk of developing type 2 diabetes, osteoporosis, rheumatoid arthritis, cardiovascular disease, and breast, ovarian, uterine, and endometrial cancer later in life. Breastfeeding basics Starting breastfeeding  Find a comfortable place to sit or lie down, with your neck and back well-supported.  Place a pillow or a rolled-up blanket under your baby to bring him or her to the level of your breast (if you are seated). Nursing pillows are specially designed to help support your arms and your baby while you breastfeed.  Make sure that your baby's tummy (abdomen) is facing your abdomen.  Gently massage your breast. With your fingertips, massage from the outer edges of your breast inward toward the nipple. This encourages milk flow. If your milk flows slowly, you may need to continue this action during the feeding.  Support your breast with 4 fingers underneath and your thumb above your nipple (make the letter "C" with your hand). Make sure your fingers are well  away from your nipple and your baby's mouth.  Stroke your baby's lips gently with your finger or nipple.  When your baby's mouth is open wide enough, quickly bring your baby to your breast, placing your entire nipple and as much of the areola as possible into your baby's mouth. The areola is the colored area around your nipple. ? More areola should be visible above your baby's upper lip than below the lower lip. ? Your baby's lips should be opened and extended outward (flanged) to ensure an adequate, comfortable latch. ? Your baby's tongue should be between his or her lower gum and your breast.  Make sure that your baby's mouth is correctly positioned around your nipple (latched). Your baby's lips should create a seal on your breast and be turned out (everted).  It is common for your baby to suck about 2-3 minutes in order to start the flow of breast milk. Latching Teaching your baby how to latch onto your breast properly is very important. An improper latch can cause nipple pain, decreased milk supply, and poor weight gain in your baby. Also, if your baby is not latched onto your nipple properly, he or she may swallow some air during feeding. This can make your baby fussy. Burping your baby when you switch breasts during the feeding can help to get rid of the air. However, teaching your baby  to latch on properly is still the best way to prevent fussiness from swallowing air while breastfeeding. Signs that your baby has successfully latched onto your nipple  Silent tugging or silent sucking, without causing you pain. Infant's lips should be extended outward (flanged).  Swallowing heard between every 3-4 sucks once your milk has started to flow (after your let-down milk reflex occurs).  Muscle movement above and in front of his or her ears while sucking.  Signs that your baby has not successfully latched onto your nipple  Sucking sounds or smacking sounds from your baby while  breastfeeding.  Nipple pain.  If you think your baby has not latched on correctly, slip your finger into the corner of your baby's mouth to break the suction and place it between your baby's gums. Attempt to start breastfeeding again. Signs of successful breastfeeding Signs from your baby  Your baby will gradually decrease the number of sucks or will completely stop sucking.  Your baby will fall asleep.  Your baby's body will relax.  Your baby will retain a small amount of milk in his or her mouth.  Your baby will let go of your breast by himself or herself.  Signs from you  Breasts that have increased in firmness, weight, and size 1-3 hours after feeding.  Breasts that are softer immediately after breastfeeding.  Increased milk volume, as well as a change in milk consistency and color by the fifth day of breastfeeding.  Nipples that are not sore, cracked, or bleeding.  Signs that your baby is getting enough milk  Wetting at least 1-2 diapers during the first 24 hours after birth.  Wetting at least 5-6 diapers every 24 hours for the first week after birth. The urine should be clear or pale yellow by the age of 5 days.  Wetting 6-8 diapers every 24 hours as your baby continues to grow and develop.  At least 3 stools in a 24-hour period by the age of 5 days. The stool should be soft and yellow.  At least 3 stools in a 24-hour period by the age of 7 days. The stool should be seedy and yellow.  No loss of weight greater than 10% of birth weight during the first 3 days of life.  Average weight gain of 4-7 oz (113-198 g) per week after the age of 4 days.  Consistent daily weight gain by the age of 5 days, without weight loss after the age of 2 weeks. After a feeding, your baby may spit up a small amount of milk. This is normal. Breastfeeding frequency and duration Frequent feeding will help you make more milk and can prevent sore nipples and extremely full breasts (breast  engorgement). Breastfeed when you feel the need to reduce the fullness of your breasts or when your baby shows signs of hunger. This is called "breastfeeding on demand." Signs that your baby is hungry include:  Increased alertness, activity, or restlessness.  Movement of the head from side to side.  Opening of the mouth when the corner of the mouth or cheek is stroked (rooting).  Increased sucking sounds, smacking lips, cooing, sighing, or squeaking.  Hand-to-mouth movements and sucking on fingers or hands.  Fussing or crying.  Avoid introducing a pacifier to your baby in the first 4-6 weeks after your baby is born. After this time, you may choose to use a pacifier. Research has shown that pacifier use during the first year of a baby's life decreases the risk of sudden  infant death syndrome (SIDS). Allow your baby to feed on each breast as long as he or she wants. When your baby unlatches or falls asleep while feeding from the first breast, offer the second breast. Because newborns are often sleepy in the first few weeks of life, you may need to awaken your baby to get him or her to feed. Breastfeeding times will vary from baby to baby. However, the following rules can serve as a guide to help you make sure that your baby is properly fed:  Newborns (babies 75 weeks of age or younger) may breastfeed every 1-3 hours.  Newborns should not go without breastfeeding for longer than 3 hours during the day or 5 hours during the night.  You should breastfeed your baby a minimum of 8 times in a 24-hour period.  Breast milk pumping Pumping and storing breast milk allows you to make sure that your baby is exclusively fed your breast milk, even at times when you are unable to breastfeed. This is especially important if you go back to work while you are still breastfeeding, or if you are not able to be present during feedings. Your lactation consultant can help you find a method of pumping that works best  for you and give you guidelines about how long it is safe to store breast milk. Caring for your breasts while you breastfeed Nipples can become dry, cracked, and sore while breastfeeding. The following recommendations can help keep your breasts moisturized and healthy:  Avoid using soap on your nipples.  Wear a supportive bra designed especially for nursing. Avoid wearing underwire-style bras or extremely tight bras (sports bras).  Air-dry your nipples for 3-4 minutes after each feeding.  Use only cotton bra pads to absorb leaked breast milk. Leaking of breast milk between feedings is normal.  Use lanolin on your nipples after breastfeeding. Lanolin helps to maintain your skin's normal moisture barrier. Pure lanolin is not harmful (not toxic) to your baby. You may also hand express a few drops of breast milk and gently massage that milk into your nipples and allow the milk to air-dry.  In the first few weeks after giving birth, some women experience breast engorgement. Engorgement can make your breasts feel heavy, warm, and tender to the touch. Engorgement peaks within 3-5 days after you give birth. The following recommendations can help to ease engorgement:  Completely empty your breasts while breastfeeding or pumping. You may want to start by applying warm, moist heat (in the shower or with warm, water-soaked hand towels) just before feeding or pumping. This increases circulation and helps the milk flow. If your baby does not completely empty your breasts while breastfeeding, pump any extra milk after he or she is finished.  Apply ice packs to your breasts immediately after breastfeeding or pumping, unless this is too uncomfortable for you. To do this: ? Put ice in a plastic bag. ? Place a towel between your skin and the bag. ? Leave the ice on for 20 minutes, 2-3 times a day.  Make sure that your baby is latched on and positioned properly while breastfeeding.  If engorgement persists  after 48 hours of following these recommendations, contact your health care provider or a Advertising copywriter. Overall health care recommendations while breastfeeding  Eat 3 healthy meals and 3 snacks every day. Well-nourished mothers who are breastfeeding need an additional 450-500 calories a day. You can meet this requirement by increasing the amount of a balanced diet that you eat.  Drink enough water to keep your urine pale yellow or clear.  Rest often, relax, and continue to take your prenatal vitamins to prevent fatigue, stress, and low vitamin and mineral levels in your body (nutrient deficiencies).  Do not use any products that contain nicotine or tobacco, such as cigarettes and e-cigarettes. Your baby may be harmed by chemicals from cigarettes that pass into breast milk and exposure to secondhand smoke. If you need help quitting, ask your health care provider.  Avoid alcohol.  Do not use illegal drugs or marijuana.  Talk with your health care provider before taking any medicines. These include over-the-counter and prescription medicines as well as vitamins and herbal supplements. Some medicines that may be harmful to your baby can pass through breast milk.  It is possible to become pregnant while breastfeeding. If birth control is desired, ask your health care provider about options that will be safe while breastfeeding your baby. Where to find more information: [employer]: www.llli.org Contact a health care provider if:  You feel like you want to stop breastfeeding or have become frustrated with breastfeeding.  Your nipples are cracked or bleeding.  Your breasts are red, tender, or warm.  You have: ? Painful breasts or nipples. ? A swollen area on either breast. ? A fever or chills. ? Nausea or vomiting. ? Drainage other than breast milk from your nipples.  Your breasts do not become full before feedings by the fifth day after you give birth.  You  feel sad and depressed.  Your baby is: ? Too sleepy to eat well. ? Having trouble sleeping. ? More than 721 week old and wetting fewer than 6 diapers in a 24-hour period. ? Not gaining weight by 515 days of age.  Your baby has fewer than 3 stools in a 24-hour period.  Your baby's skin or the white parts of his or her eyes become yellow. Get help right away if:  Your baby is overly tired (lethargic) and does not want to wake up and feed.  Your baby develops an unexplained fever. Summary  Breastfeeding offers many health benefits for infant and mothers.  Try to breastfeed your infant when he or she shows early signs of hunger.  Gently tickle or stroke your baby's lips with your finger or nipple to allow the baby to open his or her mouth. Bring the baby to your breast. Make sure that much of the areola is in your baby's mouth. Offer one side and burp the baby before you offer the other side.  Talk with your health care provider or lactation consultant if you have questions or you face problems as you breastfeed. This information is not intended to replace advice given to you by your health care provider. Make sure you discuss any questions you have with your health care provider. Document Released: 06/17/2005 Document Revised: 07/19/2016 Document Reviewed: 07/19/2016 Elsevier Interactive Patient Education  [employer]2018 Elsevier Inc.

## 2017-10-06 NOTE — Progress Notes (Signed)
PRENATAL VISIT NOTE  Subjective:  Shirley [name] is a 27 y.o. [phone] at 10168w2d being seen today for ongoing prenatal care.  She is currently monitored for the following issues for this high-risk pregnancy and has Obesity, Class III, BMI 40-49.9 (morbid obesity) (HCC); Primary hypertension; Low back pain with right-sided sciatica; Spinal stenosis of lumbar region with radiculopathy; Osteoarthritis of both knees; Frequent No-show for appointment; Supervision of high risk pregnancy, antepartum; Chronic hypertension during pregnancy, antepartum; Maternal morbid obesity, antepartum (HCC); GBS bacteriuria; and Effort syncope on their problem list.  Patient reports no complaints.  Contractions: Not present. Vag. Bleeding: None.  Movement: Present. Denies leaking of fluid.   The following portions of the patient's history were reviewed and updated as appropriate: allergies, current medications, past family history, past medical history, past social history, past surgical history and problem list. Problem list updated.  Objective:   Vitals:   10/06/17 1056  BP: (!) 158/102  Pulse: (!) 105  Weight: (!) 318 lb 4.8 oz (144.4 kg)    Fetal Status: Fetal Heart Rate (bpm): NST   Movement: Present     General:  Alert, oriented and cooperative. Patient is in no acute distress.  Skin: Skin is warm and dry. No rash noted.   Cardiovascular: Normal heart rate noted  Respiratory: Normal respiratory effort, no problems with respiration noted  Abdomen: Soft, gravid, appropriate for gestational age.  Pain/Pressure: Present     Pelvic: Cervical exam deferred        Extremities: Normal range of motion.  Edema: None  Mental Status: Normal mood and affect. Normal behavior. Normal judgment and thought content.  NST:  Baseline: 135 bpm, Variability: Good {> 6 bpm), Accelerations: Reactive and Decelerations: Absent BPP 8/8  Assessment and Plan:  Pregnancy: [id number] at 298w2d  1. Supervision of high risk  pregnancy, antepartum   2. Chronic hypertension during pregnancy, antepartum BP is still too high, increased labetalol to 400 mg bid last visit, will add CCB--denies s/sx's of pre-eclampsia Continue ASA Check weekly labs Continue testing. - NIFEdipine (PROCARDIA XL) 30 MG 24 hr tablet; Take 1 tablet (30 mg total) by mouth daily.  Dispense: 30 tablet; Refill: 2 - CBC - Comprehensive metabolic panel - Protein / creatinine ratio, urine  Preterm labor symptoms and general obstetric precautions including but not limited to vaginal bleeding, contractions, leaking of fluid and fetal movement were reviewed in detail with the patient. Please refer to After Visit Summary for other counseling recommendations.  Return in 1 week (on 10/13/2017) for as scheduled.  Future Appointments  Date Time Provider Department Center  10/13/2017  1:15 PM [facility] NST [facility]  10/13/2017  2:15 PM Adam PhenixArnold, James G, MD [facility]  10/15/2017 11:15 AM [facility]-MFC US 4 WH-MFCUS MFC-US  10/20/2017  1:15 PM [facility] NST [facility]  10/20/2017  2:15 PM Levie HeritageStinson, Jacob J, DO [facility]  10/27/2017  9:15 AM [facility] NST [facility]  10/27/2017 10:15 AM Adam PhenixArnold, James G, MD [facility]  11/03/2017  1:15 PM [facility] NST [facility] WOC  11/03/2017  2:15 PM Reva BoresPratt, Kaevon Cotta S, MD [facility]  11/10/2017  9:15 AM [facility] NST [facility] WOC  11/10/2017 10:15 AM Reva BoresPratt, Su Duma S, MD [facility]  11/12/2017 11:00 AM WH-MFC US 3 WH-MFCUS MFC-US  11/17/2017  9:15 AM [facility] NST [facility] WOC  11/17/2017 10:15 AM Reva BoresPratt, Caitland Porchia S, MD [facility] WOC  11/25/2017  9:15 AM [facility] NST [facility] WOC  11/25/2017 10:15 AM Reva BoresPratt, Jett Fukuda S, [facility]  Donnamae Jude, MD

## 2017-10-07 LAB — CBC
HEMATOCRIT: 30.3 % — AB (ref 34.0–46.6)
Hemoglobin: 10.2 g/dL — ABNORMAL LOW (ref 11.1–15.9)
MCH: 26.6 pg (ref 26.6–33.0)
MCHC: 33.7 g/dL (ref 31.5–35.7)
MCV: 79 fL (ref 79–97)
Platelets: 253 10*3/uL (ref 150–379)
RBC: 3.84 x10E6/uL (ref 3.77–5.28)
RDW: 14.9 % (ref 12.3–15.4)
WBC: 8.3 10*3/uL (ref 3.4–10.8)

## 2017-10-07 LAB — COMPREHENSIVE METABOLIC PANEL
A/G RATIO: 0.8 — AB (ref 1.2–2.2)
ALK PHOS: 158 IU/L — AB (ref 39–117)
ALT: 7 IU/L (ref 0–32)
AST: 9 IU/L (ref 0–40)
Albumin: 2.9 g/dL — ABNORMAL LOW (ref 3.5–5.5)
BUN / CREAT RATIO: 7 — AB (ref 9–23)
BUN: 5 mg/dL — ABNORMAL LOW (ref 6–20)
Bilirubin Total: 0.3 mg/dL (ref 0.0–1.2)
CO2: 22 mmol/L (ref 20–29)
Calcium: 8.8 mg/dL (ref 8.7–10.2)
Chloride: 104 mmol/L (ref 96–106)
Creatinine, Ser: 0.68 mg/dL (ref 0.57–1.00)
GFR calc Af Amer: 138 mL/min/{1.73_m2} (ref >59)
GFR calc non Af Amer: 119 mL/min/{1.73_m2} (ref >59)
GLOBULIN, TOTAL: 3.5 g/dL (ref 1.5–4.5)
Glucose: 83 mg/dL (ref 65–99)
POTASSIUM: 3.6 mmol/L (ref 3.5–5.2)
SODIUM: 138 mmol/L (ref 134–144)
Total Protein: 6.4 g/dL (ref 6.0–8.5)

## 2017-10-07 LAB — PROTEIN / CREATININE RATIO, URINE
CREATININE, UR: 186 mg/dL
PROTEIN UR: 17.5 mg/dL
Protein/Creat Ratio: 94 mg/g creat (ref 0–200)

## 2017-10-13 ENCOUNTER — Encounter (HOSPITAL_COMMUNITY): Payer: Self-pay | Admitting: *Deleted

## 2017-10-13 ENCOUNTER — Inpatient Hospital Stay (HOSPITAL_COMMUNITY)
Admission: AD | Admit: 2017-10-13 | Discharge: 2017-10-13 | Disposition: A | Discharge status: Home / Self Care | Payer: Medicaid Other | Source: Ambulatory Visit | Attending: Obstetrics & Gynecology | Admitting: Obstetrics & Gynecology

## 2017-10-13 ENCOUNTER — Ambulatory Visit: Payer: Medicaid Other | Admitting: *Deleted

## 2017-10-13 ENCOUNTER — Ambulatory Visit (INDEPENDENT_AMBULATORY_CARE_PROVIDER_SITE_OTHER): Payer: Medicaid Other | Admitting: Obstetrics & Gynecology

## 2017-10-13 VITALS — BP 174/110 | HR 71 | Wt 321.7 lb

## 2017-10-13 DIAGNOSIS — O163 Unspecified maternal hypertension, third trimester: Secondary | ICD-10-CM | POA: Insufficient documentation

## 2017-10-13 DIAGNOSIS — R0989 Other specified symptoms and signs involving the circulatory and respiratory systems: Secondary | ICD-10-CM

## 2017-10-13 DIAGNOSIS — Z3A34 34 weeks gestation of pregnancy: Secondary | ICD-10-CM | POA: Diagnosis not present

## 2017-10-13 DIAGNOSIS — O10913 Unspecified pre-existing hypertension complicating pregnancy, third trimester: Secondary | ICD-10-CM

## 2017-10-13 DIAGNOSIS — O0993 Supervision of high risk pregnancy, unspecified, third trimester: Secondary | ICD-10-CM

## 2017-10-13 DIAGNOSIS — O10919 Unspecified pre-existing hypertension complicating pregnancy, unspecified trimester: Secondary | ICD-10-CM

## 2017-10-13 DIAGNOSIS — I10 Essential (primary) hypertension: Secondary | ICD-10-CM

## 2017-10-13 DIAGNOSIS — O0990 Supervision of high risk pregnancy, unspecified, unspecified trimester: Secondary | ICD-10-CM

## 2017-10-13 LAB — COMPREHENSIVE METABOLIC PANEL
ALT: 8 U/L — AB (ref 14–54)
AST: 15 U/L (ref 15–41)
Albumin: 2.6 g/dL — ABNORMAL LOW (ref 3.5–5.0)
Alkaline Phosphatase: 172 U/L — ABNORMAL HIGH (ref 38–126)
Anion gap: 10 (ref 5–15)
BILIRUBIN TOTAL: 0.3 mg/dL (ref 0.3–1.2)
BUN: 6 mg/dL (ref 6–20)
CALCIUM: 9 mg/dL (ref 8.9–10.3)
CHLORIDE: 105 mmol/L (ref 101–111)
CO2: 21 mmol/L — ABNORMAL LOW (ref 22–32)
CREATININE: 0.77 mg/dL (ref 0.44–1.00)
GFR calc Af Amer: >60 mL/min (ref >60)
Glucose, Bld: 108 mg/dL — ABNORMAL HIGH (ref 65–99)
Potassium: 3.4 mmol/L — ABNORMAL LOW (ref 3.5–5.1)
Sodium: 136 mmol/L (ref 135–145)
Total Protein: 7.7 g/dL (ref 6.5–8.1)

## 2017-10-13 LAB — POCT URINALYSIS DIP (DEVICE)
BILIRUBIN URINE: NEGATIVE
GLUCOSE, UA: NEGATIVE mg/dL
Hgb urine dipstick: NEGATIVE
NITRITE: NEGATIVE
PH: 6 (ref 5.0–8.0)
PROTEIN: NEGATIVE mg/dL
Specific Gravity, Urine: 1.025 (ref 1.005–1.030)
Urobilinogen, UA: 1 mg/dL (ref 0.0–1.0)

## 2017-10-13 LAB — PROTEIN / CREATININE RATIO, URINE
CREATININE, URINE: 108 mg/dL
Total Protein, Urine: <6 mg/dL

## 2017-10-13 LAB — CBC
HEMATOCRIT: 32 % — AB (ref 36.0–46.0)
HEMOGLOBIN: 10.7 g/dL — AB (ref 12.0–15.0)
MCH: 27.1 pg (ref 26.0–34.0)
MCHC: 33.4 g/dL (ref 30.0–36.0)
MCV: 81 fL (ref 78.0–100.0)
Platelets: 276 10*3/uL (ref 150–400)
RBC: 3.95 MIL/uL (ref 3.87–5.11)
RDW: 13.8 % (ref 11.5–15.5)
WBC: 7.8 10*3/uL (ref 4.0–10.5)

## 2017-10-13 LAB — TYPE AND SCREEN
ABO/RH(D): O POS
ANTIBODY SCREEN: NEGATIVE

## 2017-10-13 MED ORDER — NIFEDIPINE 10 MG PO CAPS
10.0000 mg | ORAL_CAPSULE | ORAL | Status: AC | PRN
  Administered 2017-10-13 (×3): 10 mg via ORAL
  Filled 2017-10-13 (×3): qty 1

## 2017-10-13 MED ORDER — NIFEDIPINE ER OSMOTIC RELEASE 30 MG PO TB24: 30.0000 mg | ORAL_TABLET | Freq: Two times a day (BID) | ORAL | 2 refills | Status: DC

## 2017-10-13 MED ORDER — LABETALOL HCL 5 MG/ML IV SOLN: 20.0000 mg | INTRAVENOUS | Status: DC | PRN

## 2017-10-13 MED ORDER — HYDRALAZINE HCL 20 MG/ML IJ SOLN: 10.0000 mg | Freq: Once | INTRAMUSCULAR | Status: DC | PRN

## 2017-10-13 MED ORDER — LACTATED RINGERS IV SOLN: INTRAVENOUS | Status: DC

## 2017-10-13 MED ORDER — LABETALOL HCL 200 MG PO TABS: 400.0000 mg | ORAL_TABLET | Freq: Two times a day (BID) | ORAL | 2 refills | Status: DC

## 2017-10-13 NOTE — Discharge Instructions (Signed)

## 2017-10-13 NOTE — Progress Notes (Signed)
Pt just began taking the Nifedipine last night. Pt denies H/A or visual disturbances. US for growth and BPP on 4/17

## 2017-10-13 NOTE — Progress Notes (Signed)
   PRENATAL VISIT NOTE  Subjective:  Shirley [name] is a 27 y.o. [phone] at 4672w2d being seen today for ongoing prenatal care.  She is currently monitored for the following issues for this high-risk pregnancy and has Obesity, Class III, BMI 40-49.9 (morbid obesity) (HCC); Primary hypertension; Low back pain with right-sided sciatica; Spinal stenosis of lumbar region with radiculopathy; Osteoarthritis of both knees; Frequent No-show for appointment; Supervision of high risk pregnancy, antepartum; Chronic hypertension during pregnancy, antepartum; Maternal morbid obesity, antepartum (HCC); GBS bacteriuria; and Effort syncope on their problem list.  Patient reports no complaints.  Contractions: Not present. Vag. Bleeding: None.  Movement: Present. Denies leaking of fluid.   The following portions of the patient's history were reviewed and updated as appropriate: allergies, current medications, past family history, past medical history, past social history, past surgical history and problem list. Problem list updated.  Objective:   Vitals:   10/13/17 1345 10/13/17 1431  BP: (!) 177/119 (!) 174/110  Pulse: 71   Weight: (!) 321 lb 11.2 oz (145.9 kg)     Fetal Status: Fetal Heart Rate (bpm): NST   Movement: Present     General:  Alert, oriented and cooperative. Patient is in no acute distress.  Skin: Skin is warm and dry. No rash noted.   Cardiovascular: Normal heart rate noted  Respiratory: Normal respiratory effort, no problems with respiration noted  Abdomen: Soft, gravid, appropriate for gestational age.  Pain/Pressure: Present     Pelvic: Cervical exam deferred        Extremities: Normal range of motion.  Edema: None  Mental Status: Normal mood and affect. Normal behavior. Normal judgment and thought content.   Assessment and Plan:  Pregnancy: [id number] at 5872w2d  1. Supervision of high risk pregnancy, antepartum Good fetal testing today - US MFM FETAL BPP WO NON STRESS; Future  2.  Chronic hypertension during pregnancy, antepartum Sever range BP on medication - US MFM FETAL BPP WO NON STRESS; Future  Preterm labor symptoms and general obstetric precautions including but not limited to vaginal bleeding, contractions, leaking of fluid and fetal movement were reviewed in detail with the patient. Please refer to After Visit Summary for other counseling recommendations.  Return in about 1 week (around 10/20/2017) for weekly as scheduled. To [facility] for treat elevated BP Future Appointments  Date Time Provider Department Center  10/15/2017 11:15 AM [facility]-MFC US 4 WH-MFCUS MFC-US  10/20/2017  1:15 PM [facility]OC[facility] NST [facility] WOC  10/20/2017  2:15 PM Levie HeritageStinson, Jacob J, DO [facility] WOC  10/27/2017  9:15 AM WOC-WOCA NST [facility]A WOC  10/27/2017 10:15 AM Adam PhenixArnold, Zavion Sleight G, MD [facility] WOC  11/03/2017  1:15 PM WOC-[facility]OCA NST [facility]A WOC  11/03/2017  2:15 PM Reva BoresPratt, Tanya S, MD [facility] WOC  11/10/2017  8:30 AM WH-MFC US 1 WH-MFCUS MFC-US  11/10/2017  9:15 AM WOC-WOCA NST [facility]A WOC  11/10/2017 10:15 AM Reva BoresPratt, Tanya S, MD [facility]    Scheryl DarterJames Bobie Kistler, MD

## 2017-10-13 NOTE — MAU Note (Signed)
Pt refused IV protocol. Provider ordered oral medications for BP's.

## 2017-10-13 NOTE — MAU Provider Note (Addendum)
Chief Complaint  Patient presents with  . Hypertension     First Provider Initiated Contact with Patient 10/13/17 1706      S: Shirley [name]  is a 27 y.o. y.o. year old G23P2012 female at [redacted]w[redacted]d weeks gestation who presents to MAU with elevated blood pressures. Hx primary HTN, but has a history or noncompliance.  States took her BP meds "a little late today, right before clinic".  About 1pm.. Current blood pressure medication: Nifedipine (added this week) and Labetalol.  Associated symptoms: Not Headache, Not vision changes, Not epigastric pain Contractions: none Vaginal bleeding: none Fetal movement: positive  RN Note: Pt sent from clinic with elevated BP, denies HA, visual changes, epigastric pain.  Reports good fetal movement.  Denies contractions, bleeding or LOF.  O:  Patient Vitals for the past 24 hrs:  BP Temp Temp src Pulse Height  10/13/17 2002 (!) 143/91 - - 86 -  10/13/17 1947 (!) 138/108 - - 94 -  10/13/17 1932 (!) 158/99 - - 84 -  10/13/17 1917 137/76 - - 71 -  10/13/17 1900 (!) 152/89 - - 81 -  10/13/17 1846 (!) 160/84 - - 78 -  10/13/17 1841 (!) 149/86 - - 79 -  10/13/17 1832 139/76 - - 79 -  10/13/17 1822 (!) 165/95 - - 76 -  10/13/17 1802 (!) 155/94 - - 77 -  10/13/17 1746 (!) 161/96 - - 76 -  10/13/17 1732 (!) 159/88 - - 79 -  10/13/17 1723 (!) 155/90 - - 80 -  10/13/17 1717 (!) 143/85 - - 80 -  10/13/17 1656 (!) 174/113 98.4 F (36.9 C) Oral 76 5\' 6"  (1.676 m)   General: NAD Heart: Regular rate Lungs: Normal rate and effort Abd: Soft, NT, Gravid, S=D, no RUQ pain to palpation Extremities: 1+ Pedal edema Neuro: 2+ deep tendon reflexes, No clonus Pelvic: NEFG, no bleeding or LOF.     EFM: Reassuring, Moderate variability, 15 x 15 accelerations, no decelerations Toco: rare mild contraction  Results for orders placed or performed during the hospital encounter of 10/13/17 (from the past 24 hour(s))  Protein / creatinine ratio, urine     Status: None    Collection Time: 10/13/17  5:04 PM  Result Value Ref Range   Creatinine, Urine 108.00 mg/dL   Total Protein, Urine <6 mg/dL   Protein Creatinine Ratio        0.00 - 0.15 mg/mg[Cre]  CBC     Status: Abnormal   Collection Time: 10/13/17  5:10 PM  Result Value Ref Range   WBC 7.8 4.0 - 10.5 K/uL   RBC 3.95 3.87 - 5.11 MIL/uL   Hemoglobin 10.7 (L) 12.0 - 15.0 g/dL   HCT 09.8 (L) 11.9 - 14.7 %   MCV 81.0 78.0 - 100.0 fL   MCH 27.1 26.0 - 34.0 pg   MCHC 33.4 30.0 - 36.0 g/dL   RDW 82.9 56.2 - 13.0 %   Platelets 276 150 - 400 K/uL  Comprehensive metabolic panel     Status: Abnormal   Collection Time: 10/13/17  5:10 PM  Result Value Ref Range   Sodium 136 135 - 145 mmol/L   Potassium 3.4 (L) 3.5 - 5.1 mmol/L   Chloride 105 101 - 111 mmol/L   CO2 21 (L) 22 - 32 mmol/L   Glucose, Bld 108 (H) 65 - 99 mg/dL   BUN 6 6 - 20 mg/dL   Creatinine, Ser 8.65 0.44 - 1.00 mg/dL  Calcium 9.0 8.9 - 10.3 mg/dL   Total Protein 7.7 6.5 - 8.1 g/dL   Albumin 2.6 (L) 3.5 - 5.0 g/dL   AST 15 15 - 41 U/L   ALT 8 (L) 14 - 54 U/L   Alkaline Phosphatase 172 (H) 38 - 126 U/L   Total Bilirubin 0.3 0.3 - 1.2 mg/dL   GFR calc non Af Amer >60 >60 mL/min   GFR calc Af Amer >60 >60 mL/min   Anion gap 10 5 - 15  Type and screen     Status: None   Collection Time: 10/13/17  5:10 PM  Result Value Ref Range   ABO/RH(D) O POS    Antibody Screen NEG    Sample Expiration      10/16/2017 Performed at [facility], [address] [place], [place] [address]    MDM: Preeclampsia labs drawn Results reviewed, within normal limits No measurable proteinuria BPs labile.  She declined IV medications, so we did the PO Procardia regimen She has received 3 doses of Nifedipine 10mg .  This finally achieved more reasonable BPs.  Most recent one had a low systolic but higher diastolic, though not technically severe. I had Dr Macon LargeAnyanwu review her BPs.  She recommends increasing nifedipine to bid with Labetalol bid.  Has appt  with MFM Wed and clinic next week.     A: 8074w2d week IUP Chronic hypertension, Labile with intermittent severe ranges Normal labs FHR reactive  P: Discharge home in stable condition per consult with Anyanwu, Jethro BastosUgonna A, MD.. Preeclampsia precautions. Follow-up for blood pressure check in 2 days at your doctor's office sooner as needed if symptoms worsen. Return to maternity admissions as needed in emergencies  Valora PiccoloWilliams, Orian Figueira L, CNM 10/13/2017 8:03 PM

## 2017-10-13 NOTE — MAU Note (Signed)
Pt sent from clinic with elevated BP, denies HA, visual changes, epigastric pain.  Reports good fetal movement.  Denies contractions, bleeding or LOF.

## 2017-10-13 NOTE — Patient Instructions (Signed)

## 2017-10-14 ENCOUNTER — Other Ambulatory Visit: Payer: Self-pay

## 2017-10-14 ENCOUNTER — Encounter (HOSPITAL_COMMUNITY): Payer: Self-pay | Admitting: *Deleted

## 2017-10-14 ENCOUNTER — Inpatient Hospital Stay (HOSPITAL_COMMUNITY)
Admission: AD | Admit: 2017-10-14 | Discharge: 2017-10-14 | Disposition: A | Discharge status: Home / Self Care | Payer: Medicaid Other | Source: Ambulatory Visit | Attending: Family Medicine | Admitting: Family Medicine

## 2017-10-14 DIAGNOSIS — O99213 Obesity complicating pregnancy, third trimester: Secondary | ICD-10-CM | POA: Diagnosis not present

## 2017-10-14 DIAGNOSIS — R51 Headache: Secondary | ICD-10-CM | POA: Diagnosis not present

## 2017-10-14 DIAGNOSIS — O163 Unspecified maternal hypertension, third trimester: Secondary | ICD-10-CM | POA: Diagnosis not present

## 2017-10-14 DIAGNOSIS — Z87891 Personal history of nicotine dependence: Secondary | ICD-10-CM | POA: Diagnosis not present

## 2017-10-14 DIAGNOSIS — O26893 Other specified pregnancy related conditions, third trimester: Secondary | ICD-10-CM | POA: Insufficient documentation

## 2017-10-14 DIAGNOSIS — Z3A34 34 weeks gestation of pregnancy: Secondary | ICD-10-CM | POA: Insufficient documentation

## 2017-10-14 DIAGNOSIS — O99210 Obesity complicating pregnancy, unspecified trimester: Secondary | ICD-10-CM

## 2017-10-14 DIAGNOSIS — O10919 Unspecified pre-existing hypertension complicating pregnancy, unspecified trimester: Secondary | ICD-10-CM

## 2017-10-14 DIAGNOSIS — R519 Headache, unspecified: Secondary | ICD-10-CM

## 2017-10-14 LAB — COMPREHENSIVE METABOLIC PANEL
ALK PHOS: 159 U/L — AB (ref 38–126)
ALT: 9 U/L — ABNORMAL LOW (ref 14–54)
ANION GAP: 10 (ref 5–15)
AST: 15 U/L (ref 15–41)
Albumin: 2.4 g/dL — ABNORMAL LOW (ref 3.5–5.0)
BUN: <5 mg/dL — ABNORMAL LOW (ref 6–20)
CALCIUM: 8.1 mg/dL — AB (ref 8.9–10.3)
CO2: 20 mmol/L — AB (ref 22–32)
Chloride: 105 mmol/L (ref 101–111)
Creatinine, Ser: 0.58 mg/dL (ref 0.44–1.00)
Glucose, Bld: 141 mg/dL — ABNORMAL HIGH (ref 65–99)
Potassium: 3.1 mmol/L — ABNORMAL LOW (ref 3.5–5.1)
SODIUM: 135 mmol/L (ref 135–145)
Total Bilirubin: 0.3 mg/dL (ref 0.3–1.2)
Total Protein: 6.9 g/dL (ref 6.5–8.1)

## 2017-10-14 LAB — URINALYSIS, ROUTINE W REFLEX MICROSCOPIC
Bilirubin Urine: NEGATIVE
Glucose, UA: NEGATIVE mg/dL
HGB URINE DIPSTICK: NEGATIVE
KETONES UR: NEGATIVE mg/dL
Leukocytes, UA: NEGATIVE
Nitrite: NEGATIVE
PROTEIN: NEGATIVE mg/dL
Specific Gravity, Urine: 1.009 (ref 1.005–1.030)
pH: 6 (ref 5.0–8.0)

## 2017-10-14 LAB — CBC
HCT: 30.9 % — ABNORMAL LOW (ref 36.0–46.0)
HEMOGLOBIN: 10.2 g/dL — AB (ref 12.0–15.0)
MCH: 26.6 pg (ref 26.0–34.0)
MCHC: 33 g/dL (ref 30.0–36.0)
MCV: 80.5 fL (ref 78.0–100.0)
PLATELETS: 271 10*3/uL (ref 150–400)
RBC: 3.84 MIL/uL — AB (ref 3.87–5.11)
RDW: 13.8 % (ref 11.5–15.5)
WBC: 9.6 10*3/uL (ref 4.0–10.5)

## 2017-10-14 LAB — PROTEIN / CREATININE RATIO, URINE
CREATININE, URINE: 89 mg/dL
Total Protein, Urine: <6 mg/dL

## 2017-10-14 MED ORDER — LABETALOL HCL 5 MG/ML IV SOLN: 40.0000 mg | Freq: Once | INTRAVENOUS | Status: DC | PRN

## 2017-10-14 MED ORDER — NIFEDIPINE 10 MG PO CAPS
10.0000 mg | ORAL_CAPSULE | ORAL | Status: DC | PRN
  Administered 2017-10-14: 10 mg via ORAL
  Filled 2017-10-14: qty 1

## 2017-10-14 MED ORDER — CYCLOBENZAPRINE HCL 10 MG PO TABS: 10.0000 mg | ORAL_TABLET | Freq: Three times a day (TID) | ORAL | 0 refills | Status: DC | PRN

## 2017-10-14 MED ORDER — BUTALBITAL-APAP-CAFFEINE 50-325-40 MG PO TABS
2.0000 | ORAL_TABLET | Freq: Once | ORAL | Status: AC
  Administered 2017-10-14: 2 via ORAL
  Filled 2017-10-14: qty 2

## 2017-10-14 MED ORDER — CYCLOBENZAPRINE HCL 10 MG PO TABS
10.0000 mg | ORAL_TABLET | Freq: Once | ORAL | Status: AC
  Administered 2017-10-14: 10 mg via ORAL
  Filled 2017-10-14: qty 1

## 2017-10-14 NOTE — MAU Note (Signed)
Ongoing headaches, took some Tylenol, no relief.  Denies visual changes, epigastric pain or increase in swelling.

## 2017-10-14 NOTE — MAU Provider Note (Addendum)
Patient Shirley [name] is a 27 y.o. [id number] with chronic hypertension who here with HA. She was seen in the [facility] yesterday and started on nifedpine; on 09-26-2017 she was started on labetalol 200 mg BID. She has been inconsistent in taking her medication but states that she took both her nifedipine and labetalol today.     History     CSN: [id number]  Arrival date and time: 10/14/17 1018   First Provider Initiated Contact with Patient 10/14/17 1108      Chief Complaint  Patient presents with  . Headache   Headache   This is a new problem. The current episode started today. The problem occurs constantly. The problem has been gradually worsening. The pain is located in the frontal region. The pain does not radiate. The quality of the pain is described as aching. The pain is at a severity of 8/10. Pertinent negatives include no abdominal pain, abnormal behavior, back pain, blurred vision, fever or vomiting. The symptoms are aggravated by bright light.    OB History    Gravida  4   Para  2   Term  2   Preterm      AB  1   Living  2     SAB      TAB  1   Ectopic      Multiple      Live Births  2           Past Medical History:  Diagnosis Date  . Arthritis   . Hypertension   . Obesity   . Sciatica     Past Surgical History:  Procedure Laterality Date  . TOOTH EXTRACTION N/A 10/22/2013   Procedure: DENTAL EXTRACTIONS TEETH #1, 16, 17, 32;  Surgeon: Georgia LopesScott M Jensen, DDS;  Location: [facility] OR;  Service: Oral Surgery;  Laterality: N/A;    Family History  Problem Relation Age of Onset  . Hypertension Mother   . Diabetes Maternal Grandmother   . Diabetes Paternal Grandmother   . Stroke Paternal Grandmother     Social History   Tobacco Use  . Smoking status: Former Smoker    Packs/day: 0.50    Types: Cigarettes    Last attempt to quit: 01/29/2017    Years since quitting: 0.7  . Smokeless tobacco: Never Used  Substance Use Topics  . Alcohol use: No     Alcohol/week: 0.0 oz    Frequency: Never    Comment: rare  . Drug use: No    Types: Marijuana    Comment: daily usage    Allergies: No Known Allergies  Medications Prior to Admission  Medication Sig Dispense Refill Last Dose  . aspirin EC 81 MG tablet Take 1 tablet (81 mg total) by mouth daily. 30 tablet 3 10/13/2017 at Unknown time  . labetalol (NORMODYNE) 200 MG tablet Take 2 tablets (400 mg total) by mouth every 12 (twelve) hours. 120 tablet 2 10/14/2017 at 0500  . NIFEdipine (PROCARDIA XL) 30 MG 24 hr tablet Take 1 tablet (30 mg total) by mouth every 12 (twelve) hours. 30 tablet 2 10/14/2017 at 0500  . Prenat-Methylfol-Chol-Fish Oil (PRENATAL + COMPLETE MULTI) 0.267 & 373 MG THPK Take 1 tablet by mouth daily. 120 each 2 10/13/2017 at Unknown time    Review of Systems  Constitutional: Negative.  Negative for fever.  HENT: Negative.   Eyes: Negative for blurred vision.  Respiratory: Negative.   Cardiovascular: Negative.   Gastrointestinal: Negative.  Negative for abdominal pain  and vomiting.  Genitourinary: Negative.   Musculoskeletal: Negative.  Negative for back pain.  Neurological: Positive for headaches.  Hematological: Negative.   Psychiatric/Behavioral: Negative.    Physical Exam   Blood pressure 130/71, pulse 82, temperature 98.3 F (36.8 C), temperature source Oral, resp. rate 18, weight (!) 322 lb (146.1 kg), last menstrual period 02/15/2017, SpO2 96 %.  Physical Exam  Constitutional: She is oriented to person, place, and time. She appears well-developed.  HENT:  Head: Normocephalic.  Neck: Normal range of motion.  Respiratory: Effort normal.  GI: Soft.  Musculoskeletal: Normal range of motion.  Neurological: She is alert and oriented to person, place, and time.  Skin: Skin is warm and dry.  Psychiatric: She has a normal mood and affect.    MAU Course  Procedures  MDM -Patient's BP initially high upon entry but it is being taken with a burgundy cuff and BPs  are normal (before nifedipine). Pressures have been labile while in MAU but improved with use of burgundy cuff.  -Nifedipine 10 mg oral and fioricet given.  patient states that her HA is now a 5/10 (12:30).  -Will try flexeril 10 mg.  Headache is now a 3/10 after Flexeril; now a 2/10 at 1600. -Still without signs of pre-e with severe features.   NST: 135 bpm with mod var, uterine irratability, no decels, present acels.  -CBC, CMP and PCR are all normal today.    Assessment and Plan   1. Headache in pregnancy, antepartum, third trimester   2. Chronic hypertension during pregnancy, antepartum   3. Maternal morbid obesity, antepartum (HCC)    2. Patient stable for discharge with plans to keep appt tomorrow at [facility], including plans for BPP.  Emphasized importance of taking her BP meds and following plan of care.   3. RX for Flexeril given  4. All questions answered; patient stable for discharge  Charlesetta Garibaldi [facility] 10/14/2017, 11:46 AM

## 2017-10-14 NOTE — Progress Notes (Signed)
Taken with blue cuff

## 2017-10-14 NOTE — Discharge Instructions (Signed)
Preeclampsia and Eclampsia °Preeclampsia is a serious condition that develops only during pregnancy. It is also called toxemia of pregnancy. This condition causes high blood pressure along with other symptoms, such as swelling and headaches. These symptoms may develop as the condition gets worse. Preeclampsia may occur at 20 weeks of pregnancy or later. °Diagnosing and treating preeclampsia early is very important. If not treated early, it can cause serious problems for you and your baby. One problem it can lead to is eclampsia, which is a condition that causes muscle jerking or shaking (convulsions or seizures) in the mother. Delivering your baby is the best treatment for preeclampsia or eclampsia. Preeclampsia and eclampsia symptoms usually go away after your baby is born. °What are the causes? °The cause of preeclampsia is not known. °What increases the risk? °The following risk factors make you more likely to develop preeclampsia: °· Being pregnant for the first time. °· Having had preeclampsia during a past pregnancy. °· Having a family history of preeclampsia. °· Having high blood pressure. °· Being pregnant with twins or triplets. °· Being 35 or older. °· Being African-American. °· Having kidney disease or diabetes. °· Having medical conditions such as lupus or blood diseases. °· Being very overweight (obese). ° °What are the signs or symptoms? °The earliest signs of preeclampsia are: °· High blood pressure. °· Increased protein in your urine. Your health care provider will check for this at every visit before you give birth (prenatal visit). ° °Other symptoms that may develop as the condition gets worse include: °· Severe headaches. °· Sudden weight gain. °· Swelling of the hands, face, legs, and feet. °· Nausea and vomiting. °· Vision problems, such as blurred or double vision. °· Numbness in the face, arms, legs, and feet. °· Urinating less than usual. °· Dizziness. °· Slurred speech. °· Abdominal pain,  especially upper abdominal pain. °· Convulsions or seizures. ° °Symptoms generally go away after giving birth. °How is this diagnosed? °There are no screening tests for preeclampsia. Your health care provider will ask you about symptoms and check for signs of preeclampsia during your prenatal visits. You may also have tests that include: °· Urine tests. °· Blood tests. °· Checking your blood pressure. °· Monitoring your baby’s heart rate. °· Ultrasound. ° °How is this treated? °You and your health care provider will determine the treatment approach that is best for you. Treatment may include: °· Having more frequent prenatal exams to check for signs of preeclampsia, if you have an increased risk for preeclampsia. °· Bed rest. °· Reducing how much salt (sodium) you eat. °· Medicine to lower your blood pressure. °· Staying in the hospital, if your condition is severe. There, treatment will focus on controlling your blood pressure and the amount of fluids in your body (fluid retention). °· You may need to take medicine (magnesium sulfate) to prevent seizures. This medicine may be given as an injection or through an IV tube. °· Delivering your baby early, if your condition gets worse. You may have your labor started with medicine (induced), or you may have a cesarean delivery. ° °Follow these instructions at home: °Eating and drinking ° °· Drink enough fluid to keep your urine clear or pale yellow. °· Eat a healthy diet that is low in sodium. Do not add salt to your food. Check nutrition labels to see how much sodium a food or beverage contains. °· Avoid caffeine. °Lifestyle °· Do not use any products that contain nicotine or tobacco, such as cigarettes   and e-cigarettes. If you need help quitting, ask your health care provider. °· Do not use alcohol or drugs. °· Avoid stress as much as possible. Rest and get plenty of sleep. °General instructions °· Take over-the-counter and prescription medicines only as told by your  health care provider. °· When lying down, lie on your side. This keeps pressure off of your baby. °· When sitting or lying down, raise (elevate) your feet. Try putting some pillows underneath your lower legs. °· Exercise regularly. Ask your health care provider what kinds of exercise are best for you. °· Keep all follow-up and prenatal visits as told by your health care provider. This is important. °How is this prevented? °To prevent preeclampsia or eclampsia from developing during another pregnancy: °· Get proper medical care during pregnancy. Your health care provider may be able to prevent preeclampsia or diagnose and treat it early. °· Your health care provider may have you take a low-dose aspirin or a calcium supplement during your next pregnancy. °· You may have tests of your blood pressure and kidney function after giving birth. °· Maintain a healthy weight. Ask your health care provider for help managing weight gain during pregnancy. °· Work with your health care provider to manage any long-term (chronic) health conditions you have, such as diabetes or kidney problems. ° °Contact a health care provider if: °· You gain more weight than expected. °· You have headaches. °· You have nausea or vomiting. °· You have abdominal pain. °· You feel dizzy or light-headed. °Get help right away if: °· You develop sudden or severe swelling anywhere in your body. This usually happens in the legs. °· You gain 5 lbs (2.3 kg) or more during one week. °· You have severe: °? Abdominal pain. °? Headaches. °? Dizziness. °? Vision problems. °? Confusion. °? Nausea or vomiting. °· You have a seizure. °· You have trouble moving any part of your body. °· You develop numbness in any part of your body. °· You have trouble speaking. °· You have any abnormal bleeding. °· You pass out. °This information is not intended to replace advice given to you by your health care provider. Make sure you discuss any questions you have with your health  care provider. °Document Released: 06/14/2000 Document Revised: 02/13/2016 Document Reviewed: 01/22/2016 °Elsevier Interactive Patient Education © 2018 [employer] ° °

## 2017-10-15 ENCOUNTER — Other Ambulatory Visit (HOSPITAL_COMMUNITY): Payer: Self-pay | Admitting: Obstetrics and Gynecology

## 2017-10-15 ENCOUNTER — Ambulatory Visit (HOSPITAL_COMMUNITY)
Admission: RE | Admit: 2017-10-15 | Discharge: 2017-10-15 | Disposition: A | Discharge status: Home / Self Care | Payer: Medicaid Other | Source: Ambulatory Visit | Attending: Obstetrics and Gynecology | Admitting: Obstetrics and Gynecology

## 2017-10-15 ENCOUNTER — Encounter (HOSPITAL_COMMUNITY): Payer: Self-pay

## 2017-10-15 DIAGNOSIS — O0990 Supervision of high risk pregnancy, unspecified, unspecified trimester: Secondary | ICD-10-CM

## 2017-10-15 DIAGNOSIS — O99213 Obesity complicating pregnancy, third trimester: Secondary | ICD-10-CM | POA: Insufficient documentation

## 2017-10-15 DIAGNOSIS — I10 Essential (primary) hypertension: Secondary | ICD-10-CM

## 2017-10-15 DIAGNOSIS — Z3A34 34 weeks gestation of pregnancy: Secondary | ICD-10-CM

## 2017-10-15 DIAGNOSIS — O10013 Pre-existing essential hypertension complicating pregnancy, third trimester: Secondary | ICD-10-CM | POA: Insufficient documentation

## 2017-10-15 DIAGNOSIS — E669 Obesity, unspecified: Secondary | ICD-10-CM | POA: Diagnosis not present

## 2017-10-15 DIAGNOSIS — O99210 Obesity complicating pregnancy, unspecified trimester: Secondary | ICD-10-CM

## 2017-10-15 DIAGNOSIS — O10919 Unspecified pre-existing hypertension complicating pregnancy, unspecified trimester: Secondary | ICD-10-CM

## 2017-10-20 ENCOUNTER — Ambulatory Visit (INDEPENDENT_AMBULATORY_CARE_PROVIDER_SITE_OTHER): Payer: Medicaid Other | Admitting: Family Medicine

## 2017-10-20 ENCOUNTER — Ambulatory Visit (INDEPENDENT_AMBULATORY_CARE_PROVIDER_SITE_OTHER): Payer: Medicaid Other | Admitting: *Deleted

## 2017-10-20 ENCOUNTER — Ambulatory Visit: Payer: Self-pay

## 2017-10-20 VITALS — BP 181/115 | HR 68 | Wt 318.6 lb

## 2017-10-20 DIAGNOSIS — O0993 Supervision of high risk pregnancy, unspecified, third trimester: Secondary | ICD-10-CM | POA: Diagnosis not present

## 2017-10-20 DIAGNOSIS — M5441 Lumbago with sciatica, right side: Secondary | ICD-10-CM | POA: Diagnosis not present

## 2017-10-20 DIAGNOSIS — O99211 Obesity complicating pregnancy, first trimester: Secondary | ICD-10-CM

## 2017-10-20 DIAGNOSIS — O99213 Obesity complicating pregnancy, third trimester: Secondary | ICD-10-CM

## 2017-10-20 DIAGNOSIS — O10913 Unspecified pre-existing hypertension complicating pregnancy, third trimester: Secondary | ICD-10-CM

## 2017-10-20 DIAGNOSIS — M9903 Segmental and somatic dysfunction of lumbar region: Secondary | ICD-10-CM | POA: Diagnosis not present

## 2017-10-20 DIAGNOSIS — Z331 Pregnant state, incidental: Secondary | ICD-10-CM

## 2017-10-20 DIAGNOSIS — O10919 Unspecified pre-existing hypertension complicating pregnancy, unspecified trimester: Secondary | ICD-10-CM

## 2017-10-20 DIAGNOSIS — O0990 Supervision of high risk pregnancy, unspecified, unspecified trimester: Secondary | ICD-10-CM

## 2017-10-20 DIAGNOSIS — R8271 Bacteriuria: Secondary | ICD-10-CM

## 2017-10-20 LAB — POCT URINALYSIS DIP (DEVICE)
BILIRUBIN URINE: NEGATIVE
GLUCOSE, UA: NEGATIVE mg/dL
Hgb urine dipstick: NEGATIVE
NITRITE: NEGATIVE
Protein, ur: NEGATIVE mg/dL
Specific Gravity, Urine: 1.025 (ref 1.005–1.030)
UROBILINOGEN UA: 0.2 mg/dL (ref 0.0–1.0)
pH: 5.5 (ref 5.0–8.0)

## 2017-10-20 MED ORDER — LABETALOL HCL 200 MG PO TABS: 400.0000 mg | ORAL_TABLET | Freq: Three times a day (TID) | ORAL | 2 refills | Status: DC

## 2017-10-20 NOTE — Progress Notes (Signed)
   PRENATAL VISIT NOTE  Subjective:  Shirley [name] is a 27 y.o. [phone] at 5411w2d being seen today for ongoing prenatal care.  She is currently monitored for the following issues for this high-risk pregnancy and has Obesity, Class III, BMI 40-49.9 (morbid obesity) (HCC); Primary hypertension; Low back pain with right-sided sciatica; Spinal stenosis of lumbar region with radiculopathy; Osteoarthritis of both knees; Frequent No-show for appointment; Supervision of high risk pregnancy, antepartum; Chronic hypertension during pregnancy, antepartum; Maternal morbid obesity, antepartum (HCC); GBS bacteriuria; and Effort syncope on their problem list.  Patient reports back pain in left lower back, has long history of this. Pain radiating down to right ankle. Worse now..  Contractions: Not present. Vag. Bleeding: None.  Movement: Present. Denies leaking of fluid.   The following portions of the patient's history were reviewed and updated as appropriate: allergies, current medications, past family history, past medical history, past social history, past surgical history and problem list. Problem list updated.  Objective:   Vitals:   10/20/17 1339  BP: (!) 182/113  Pulse: 68  Weight: (!) 318 lb 9.6 oz (144.5 kg)    Fetal Status: Fetal Heart Rate (bpm): NST   Movement: Present     General:  Alert, oriented and cooperative. Patient is in no acute distress.  Skin: Skin is warm and dry. No rash noted.   Cardiovascular: Normal heart rate noted  Respiratory: Normal respiratory effort, no problems with respiration noted  Abdomen: Soft, gravid, appropriate for gestational age. Pain/Pressure: Present     Pelvic:  Cervical exam deferred        MSK: Restriction, tenderness, tissue texture changes, and paraspinal spasm in the right lumbar spine. Piriformis strain  Neuro: Moves all four extremities with no focal neurological deficit  Extremities: Normal range of motion.     Mental Status: Normal mood and  affect. Normal behavior. Normal judgment and thought content.   OSE: Head   Cervical   Thoracic   Rib   Lumbar L5 ESRR  Sacrum L/L  Pelvis Right ant innom    Assessment and Plan:  Pregnancy: [id number] at 2011w2d  1. Supervision of high risk pregnancy, antepartum FHT normal  2. Chronic hypertension during pregnancy, antepartum Patient's blood pressure quite labile. No severe symptoms. Will increase labetalol to 400mg  TID - labetalol (NORMODYNE) 200 MG tablet; Take 2 tablets (400 mg total) by mouth 3 (three) times daily.  Dispense: 180 tablet; Refill: 2  3. Chronic hypertension during pregnancy, antepartum - labetalol (NORMODYNE) 200 MG tablet; Take 2 tablets (400 mg total) by mouth 3 (three) times daily.  Dispense: 180 tablet; Refill: 2  4. GBS bacteriuria Intrapartum prophylaxis  5. Obesity affecting pregnancy in first trimester  6. Acute right-sided low back pain with right-sided sciatica 7. Somatic dysfxn lumbar OMT done after patient permission. HVLA technique utilized. 3 areas treated with improvement of tissue texture and joint mobility. Patient tolerated procedure well.  Stretches demonstrated and taught.   Preterm labor symptoms and general obstetric precautions including but not limited to vaginal bleeding, contractions, leaking of fluid and fetal movement were reviewed in detail with the patient. Please refer to After Visit Summary for other counseling recommendations.  Return in about 1 week (around 10/27/2017) for as scheduled.  Levie HeritageStinson, Kaylamarie Swickard J, DO

## 2017-10-20 NOTE — Progress Notes (Signed)

## 2017-10-20 NOTE — Progress Notes (Signed)
Pt reports back, leg and hip pain - taking flexeril w/o relief. Pt has occasional headaches - none today. Pt also denies visual disturbances. All BP's taken with Lg, long cuff.

## 2017-10-27 ENCOUNTER — Other Ambulatory Visit: Payer: Self-pay

## 2017-10-27 ENCOUNTER — Encounter (HOSPITAL_COMMUNITY): Payer: Self-pay | Admitting: General Practice

## 2017-10-27 ENCOUNTER — Ambulatory Visit: Payer: Self-pay

## 2017-10-27 ENCOUNTER — Ambulatory Visit (INDEPENDENT_AMBULATORY_CARE_PROVIDER_SITE_OTHER): Payer: Medicaid Other | Admitting: Obstetrics & Gynecology

## 2017-10-27 ENCOUNTER — Ambulatory Visit (INDEPENDENT_AMBULATORY_CARE_PROVIDER_SITE_OTHER): Payer: Medicaid Other | Admitting: *Deleted

## 2017-10-27 ENCOUNTER — Inpatient Hospital Stay (HOSPITAL_COMMUNITY)
Admission: AD | Admit: 2017-10-27 | Discharge: 2017-11-04 | DRG: 807 | Disposition: A | Discharge status: Home / Self Care | Payer: Medicaid Other | Source: Ambulatory Visit | Attending: Obstetrics & Gynecology | Admitting: Obstetrics & Gynecology

## 2017-10-27 ENCOUNTER — Other Ambulatory Visit (HOSPITAL_COMMUNITY)
Admission: RE | Admit: 2017-10-27 | Discharge: 2017-10-27 | Disposition: A | Discharge status: Home / Self Care | Payer: Medicaid Other | Source: Ambulatory Visit | Attending: Obstetrics & Gynecology | Admitting: Obstetrics & Gynecology

## 2017-10-27 VITALS — BP 208/125 | HR 72 | Wt 322.7 lb

## 2017-10-27 DIAGNOSIS — O1092 Unspecified pre-existing hypertension complicating childbirth: Secondary | ICD-10-CM | POA: Diagnosis not present

## 2017-10-27 DIAGNOSIS — Z87891 Personal history of nicotine dependence: Secondary | ICD-10-CM

## 2017-10-27 DIAGNOSIS — R030 Elevated blood-pressure reading, without diagnosis of hypertension: Secondary | ICD-10-CM | POA: Diagnosis present

## 2017-10-27 DIAGNOSIS — Z5329 Procedure and treatment not carried out because of patient's decision for other reasons: Secondary | ICD-10-CM

## 2017-10-27 DIAGNOSIS — O9902 Anemia complicating childbirth: Secondary | ICD-10-CM | POA: Diagnosis present

## 2017-10-27 DIAGNOSIS — M48061 Spinal stenosis, lumbar region without neurogenic claudication: Secondary | ICD-10-CM | POA: Diagnosis present

## 2017-10-27 DIAGNOSIS — Z91199 Patient's noncompliance with other medical treatment and regimen due to unspecified reason: Secondary | ICD-10-CM

## 2017-10-27 DIAGNOSIS — O10919 Unspecified pre-existing hypertension complicating pregnancy, unspecified trimester: Secondary | ICD-10-CM | POA: Diagnosis present

## 2017-10-27 DIAGNOSIS — O169 Unspecified maternal hypertension, unspecified trimester: Secondary | ICD-10-CM

## 2017-10-27 DIAGNOSIS — O1002 Pre-existing essential hypertension complicating childbirth: Secondary | ICD-10-CM | POA: Diagnosis present

## 2017-10-27 DIAGNOSIS — Z3A37 37 weeks gestation of pregnancy: Secondary | ICD-10-CM | POA: Diagnosis not present

## 2017-10-27 DIAGNOSIS — D649 Anemia, unspecified: Secondary | ICD-10-CM | POA: Diagnosis present

## 2017-10-27 DIAGNOSIS — O99824 Streptococcus B carrier state complicating childbirth: Secondary | ICD-10-CM | POA: Diagnosis present

## 2017-10-27 DIAGNOSIS — Z6841 Body Mass Index (BMI) 40.0 and over, adult: Secondary | ICD-10-CM | POA: Diagnosis present

## 2017-10-27 DIAGNOSIS — O0990 Supervision of high risk pregnancy, unspecified, unspecified trimester: Secondary | ICD-10-CM

## 2017-10-27 DIAGNOSIS — Z3A36 36 weeks gestation of pregnancy: Secondary | ICD-10-CM | POA: Diagnosis not present

## 2017-10-27 DIAGNOSIS — O99214 Obesity complicating childbirth: Secondary | ICD-10-CM | POA: Diagnosis present

## 2017-10-27 DIAGNOSIS — O10913 Unspecified pre-existing hypertension complicating pregnancy, third trimester: Secondary | ICD-10-CM

## 2017-10-27 DIAGNOSIS — Z9119 Patient's noncompliance with other medical treatment and regimen: Secondary | ICD-10-CM

## 2017-10-27 DIAGNOSIS — O09899 Supervision of other high risk pregnancies, unspecified trimester: Secondary | ICD-10-CM

## 2017-10-27 DIAGNOSIS — O99210 Obesity complicating pregnancy, unspecified trimester: Secondary | ICD-10-CM

## 2017-10-27 DIAGNOSIS — O163 Unspecified maternal hypertension, third trimester: Secondary | ICD-10-CM | POA: Diagnosis not present

## 2017-10-27 DIAGNOSIS — R8271 Bacteriuria: Secondary | ICD-10-CM

## 2017-10-27 DIAGNOSIS — O0993 Supervision of high risk pregnancy, unspecified, third trimester: Secondary | ICD-10-CM

## 2017-10-27 LAB — COMPREHENSIVE METABOLIC PANEL
ALK PHOS: 182 U/L — AB (ref 38–126)
ALT: 8 U/L — AB (ref 14–54)
ANION GAP: 9 (ref 5–15)
AST: 15 U/L (ref 15–41)
Albumin: 2.4 g/dL — ABNORMAL LOW (ref 3.5–5.0)
BILIRUBIN TOTAL: 0.4 mg/dL (ref 0.3–1.2)
BUN: 7 mg/dL (ref 6–20)
CALCIUM: 8.9 mg/dL (ref 8.9–10.3)
CO2: 19 mmol/L — AB (ref 22–32)
CREATININE: 0.7 mg/dL (ref 0.44–1.00)
Chloride: 108 mmol/L (ref 101–111)
GFR calc non Af Amer: >60 mL/min (ref >60)
GLUCOSE: 82 mg/dL (ref 65–99)
Potassium: 4 mmol/L (ref 3.5–5.1)
SODIUM: 136 mmol/L (ref 135–145)
TOTAL PROTEIN: 6.7 g/dL (ref 6.5–8.1)

## 2017-10-27 LAB — CBC
HCT: 33.6 % — ABNORMAL LOW (ref 36.0–46.0)
Hemoglobin: 11.1 g/dL — ABNORMAL LOW (ref 12.0–15.0)
MCH: 26.6 pg (ref 26.0–34.0)
MCHC: 33 g/dL (ref 30.0–36.0)
MCV: 80.4 fL (ref 78.0–100.0)
PLATELETS: 278 10*3/uL (ref 150–400)
RBC: 4.18 MIL/uL (ref 3.87–5.11)
RDW: 14 % (ref 11.5–15.5)
WBC: 8.9 10*3/uL (ref 4.0–10.5)

## 2017-10-27 LAB — POCT URINALYSIS DIP (DEVICE)
BILIRUBIN URINE: NEGATIVE
Glucose, UA: NEGATIVE mg/dL
HGB URINE DIPSTICK: NEGATIVE
KETONES UR: NEGATIVE mg/dL
Nitrite: NEGATIVE
PH: 6 (ref 5.0–8.0)
Protein, ur: NEGATIVE mg/dL
SPECIFIC GRAVITY, URINE: 1.02 (ref 1.005–1.030)
Urobilinogen, UA: 0.2 mg/dL (ref 0.0–1.0)

## 2017-10-27 LAB — TYPE AND SCREEN
ABO/RH(D): O POS
Antibody Screen: NEGATIVE

## 2017-10-27 LAB — PROTEIN / CREATININE RATIO, URINE
Creatinine, Urine: 102 mg/dL
PROTEIN CREATININE RATIO: 0.1 mg/mg{creat} (ref 0.00–0.15)
TOTAL PROTEIN, URINE: 10 mg/dL

## 2017-10-27 MED ORDER — HYDRALAZINE HCL 20 MG/ML IJ SOLN
INTRAMUSCULAR | Status: AC
  Filled 2017-10-27: qty 1

## 2017-10-27 MED ORDER — OXYTOCIN 40 UNITS IN LACTATED RINGERS INFUSION - SIMPLE MED: 2.5000 [IU]/h | INTRAVENOUS | Status: DC

## 2017-10-27 MED ORDER — LACTATED RINGERS IV SOLN
INTRAVENOUS | Status: DC
  Administered 2017-10-27 (×2): via INTRAVENOUS

## 2017-10-27 MED ORDER — BETAMETHASONE SOD PHOS & ACET 6 (3-3) MG/ML IJ SUSP
12.0000 mg | INTRAMUSCULAR | Status: AC
  Administered 2017-10-27 – 2017-10-28 (×2): 12 mg via INTRAMUSCULAR
  Filled 2017-10-27 (×2): qty 2

## 2017-10-27 MED ORDER — FLEET ENEMA 7-19 GM/118ML RE ENEM: 1.0000 | ENEMA | RECTAL | Status: DC | PRN

## 2017-10-27 MED ORDER — SOD CITRATE-CITRIC ACID 500-334 MG/5ML PO SOLN
30.0000 mL | ORAL | Status: DC | PRN
  Administered 2017-10-28: 30 mL via ORAL
  Filled 2017-10-27: qty 15

## 2017-10-27 MED ORDER — OXYCODONE-ACETAMINOPHEN 5-325 MG PO TABS: 1.0000 | ORAL_TABLET | ORAL | Status: DC | PRN

## 2017-10-27 MED ORDER — LABETALOL HCL 200 MG PO TABS
400.0000 mg | ORAL_TABLET | Freq: Three times a day (TID) | ORAL | Status: DC
  Administered 2017-10-27 (×2): 400 mg via ORAL
  Filled 2017-10-27 (×2): qty 2

## 2017-10-27 MED ORDER — LIDOCAINE HCL (PF) 1 % IJ SOLN: 30.0000 mL | INTRAMUSCULAR | Status: DC | PRN

## 2017-10-27 MED ORDER — LACTATED RINGERS IV SOLN: 500.0000 mL | INTRAVENOUS | Status: DC | PRN

## 2017-10-27 MED ORDER — LABETALOL HCL 200 MG PO TABS
600.0000 mg | ORAL_TABLET | Freq: Three times a day (TID) | ORAL | Status: DC
  Filled 2017-10-27: qty 3

## 2017-10-27 MED ORDER — ACETAMINOPHEN 325 MG PO TABS: 650.0000 mg | ORAL_TABLET | ORAL | Status: DC | PRN

## 2017-10-27 MED ORDER — PRENATAL MULTIVITAMIN CH
1.0000 | ORAL_TABLET | Freq: Every day | ORAL | Status: DC
  Administered 2017-10-28 – 2017-10-31 (×4): 1 via ORAL
  Filled 2017-10-27 (×4): qty 1

## 2017-10-27 MED ORDER — OXYCODONE-ACETAMINOPHEN 5-325 MG PO TABS: 2.0000 | ORAL_TABLET | ORAL | Status: DC | PRN

## 2017-10-27 MED ORDER — CALCIUM CARBONATE ANTACID 500 MG PO CHEW
2.0000 | CHEWABLE_TABLET | ORAL | Status: DC | PRN
  Administered 2017-10-28: 400 mg via ORAL
  Filled 2017-10-27: qty 2

## 2017-10-27 MED ORDER — NIFEDIPINE ER OSMOTIC RELEASE 30 MG PO TB24
60.0000 mg | ORAL_TABLET | Freq: Every day | ORAL | Status: DC
  Administered 2017-10-27 – 2017-10-30 (×4): 60 mg via ORAL
  Filled 2017-10-27: qty 2
  Filled 2017-10-27: qty 1
  Filled 2017-10-27 (×2): qty 2
  Filled 2017-10-27: qty 1

## 2017-10-27 MED ORDER — HYDRALAZINE HCL 20 MG/ML IJ SOLN
10.0000 mg | INTRAMUSCULAR | Status: AC | PRN
  Administered 2017-10-27 (×2): 10 mg via INTRAVENOUS
  Filled 2017-10-27: qty 1

## 2017-10-27 MED ORDER — POLYETHYLENE GLYCOL 3350 17 G PO PACK
17.0000 g | PACK | Freq: Every day | ORAL | Status: DC
  Administered 2017-10-30 – 2017-10-31 (×2): 17 g via ORAL
  Filled 2017-10-27 (×6): qty 1

## 2017-10-27 MED ORDER — ZOLPIDEM TARTRATE 5 MG PO TABS: 5.0000 mg | ORAL_TABLET | Freq: Every evening | ORAL | Status: DC | PRN

## 2017-10-27 MED ORDER — ONDANSETRON HCL 4 MG/2ML IJ SOLN: 4.0000 mg | Freq: Four times a day (QID) | INTRAMUSCULAR | Status: DC | PRN

## 2017-10-27 MED ORDER — OXYTOCIN BOLUS FROM INFUSION: 500.0000 mL | Freq: Once | INTRAVENOUS | Status: DC

## 2017-10-27 MED ORDER — HYDRALAZINE HCL 20 MG/ML IJ SOLN
10.0000 mg | INTRAMUSCULAR | Status: AC | PRN
  Administered 2017-10-27 – 2017-10-30 (×2): 10 mg via INTRAVENOUS
  Filled 2017-10-27: qty 1

## 2017-10-27 MED ORDER — LABETALOL HCL 200 MG PO TABS
400.0000 mg | ORAL_TABLET | Freq: Three times a day (TID) | ORAL | Status: DC
  Administered 2017-10-27 – 2017-10-30 (×8): 400 mg via ORAL
  Filled 2017-10-27 (×7): qty 2

## 2017-10-27 MED ORDER — POLYETHYLENE GLYCOL 3350 17 G PO PACK: 17.0000 g | PACK | Freq: Every day | ORAL | Status: DC

## 2017-10-27 NOTE — Progress Notes (Signed)
OB Note Will leave pt on home dose of labetalol 400 tid and not increase given that her bp is normal. Will also do an nst later tonight to make fetus doesn't have any issues with her normal bp.   Cornelia Copa MD Attending [facility] [employer] (Faculty Practice) 10/27/2017 Time: 2242

## 2017-10-27 NOTE — Progress Notes (Signed)
Pt informed that the ultrasound is considered a limited OB ultrasound and is not intended to be a complete ultrasound exam.  Patient also informed that the ultrasound is not being completed with the intent of assessing for fetal or placental anomalies or any pelvic abnormalities.  Explained that the purpose of today's ultrasound is to assess for presentation, BPP and amniotic fluid voluem.  Patient acknowledges the purpose of the exam and the limitations of the study.    

## 2017-10-27 NOTE — Progress Notes (Signed)
Shirley [name] is a 27 y.o. [id number] at [redacted]w[redacted]d by LMP admitted for uncontrolled chronic hypertension.   Subjective: Patient without contractions. Blood pressure improved to the 140s/80s after hydralazine, labetalol, and nifedipine. Starting to rise back into the 170s/100s. Remains asymptomatic without headache, blurry vision, shortness of breath, lower extremity edema.   Objective: BP (!) 172/101 Comment: MD aware, see mar  Pulse 90   Temp 97.8 F (36.6 C) (Oral)   Resp 18   Ht  (1.676 m)   Wt (!) 147.4 kg (325 lb 0.6 oz)   LMP 02/15/2017 (Exact Date)   BMI 52.46 kg/m  No intake/output data recorded. No intake/output data recorded.  FHT:  FHR: 135 bpm, variability: moderate,  accelerations:  Present,  decelerations:  Absent UC:   irregular SVE:      Labs: Lab Results  Component Value Date   WBC 8.9 10/27/2017   HGB 11.1 (L) 10/27/2017   HCT 33.6 (L) 10/27/2017   MCV 80.4 10/27/2017   PLT 278 10/27/2017    Assessment / Plan: Admitted for uncontrolled chronic hypertension, with goal of managing blood pressure and then subsequently possible induction.   Labor: Patient is preterm. Blood pressure is improving. Betamethasone x 1. Considering possible induction, although waiting until 37 weeks would be ideal.   Hypertension: Blood pressure improved, but is starting to rise again. Remains asymptomatic. Received home labetalol and hydralazine. Just received home dose of labetalol #2. If remains elevated after the next check, will re-dose hydralazine.   Preeclampsia:  no signs or symptoms of toxicity Fetal Wellbeing:  Category I Pain Control:  none I/D:  GBS Bacteriurea- Penicillin Anticipated MOD:  NSVD  Gorden Harms 10/27/2017, 4:20 PM

## 2017-10-27 NOTE — Patient Instructions (Signed)

## 2017-10-27 NOTE — Progress Notes (Signed)
L&D Note  10/27/2017 - 7:37 PM  26 y.o. [id number] [redacted]w[redacted]d . Pregnancy complicated by cHTN, poor patient compliance, BMI 51, GBS bacteruria  Patient Active Problem List   Diagnosis Date Noted  . Chronic hypertension affecting pregnancy 10/27/2017  . Effort syncope 08/28/2017  . GBS bacteriuria 05/09/2017  . Chronic hypertension during pregnancy, antepartum 05/07/2017  . Maternal morbid obesity, antepartum (HCC) 05/07/2017  . Supervision of high risk pregnancy, antepartum 04/04/2017  . Frequent No-show for appointment 12/06/2016  . Osteoarthritis of both knees 09/29/2015  . 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  . Low back pain with right-sided sciatica 09/15/2015  . Obesity, Class III, BMI 40-49.9 (morbid obesity) (HCC) 12/02/2006  . Primary hypertension 08/28/2006    [name] is admitted for BP control   Subjective:  No s/s of pre-eclampsia or preterm labor  Objective:   Vitals:   10/27/17 1806 10/27/17 1833 10/27/17 1857 10/27/17 1937  BP: (!) 151/90 (!) 148/86 (!) 145/84 138/86  Pulse: 95 93 97 97  Resp: Temp:      TempSrc:      Weight:      Height:        Current Vital Signs 24h Vital Sign Ranges  T (!) 97.4 F (36.3 C) Temp  Avg: 97.6 F (36.4 C)  Min: 97.4 F (36.3 C)  Max: 97.8 F (36.6 C)  BP 138/86 BP  Min: 138/86  Max: 208/125  HR 97 Pulse  Avg: 86.6  Min: 72  Max: 97  RR 18 Resp  Avg: 18  Min: 18  Max: 18  SaO2     No data recorded       24 Hour I/O Current Shift I/O  Time Ins Outs No intake/output data recorded. No intake/output data recorded.   Category I with accels Toco: irregular Gen: NAD  Labs:  Recent Labs  Lab 10/27/17 1224  WBC 8.9  HGB 11.1*  HCT 33.6*  PLT 278   Recent Labs  Lab 10/27/17 1224  NA 136  K 4.0  CL 108  CO2 19*  BUN 7  CREATININE 0.70  CALCIUM 8.9  PROT 6.7  BILITOT 0.4  ALKPHOS 182*  ALT 8*  AST 15  GLUCOSE 82   PC ratio: 100  Medications Current  Facility-Administered Medications  Medication Dose Route Frequency Provider Last Rate Last Dose  . acetaminophen (TYLENOL) tablet 650 mg  650 mg Oral Q4H PRN Degele, Kandra Nicolas, MD      . betamethasone acetate-betamethasone sodium phosphate (CELESTONE) injection 12 mg  12 mg Intramuscular Q24 Hr x 2 Degele, Kandra Nicolas, MD   12 mg at 10/27/17 1332  . calcium carbonate (TUMS - dosed in mg elemental calcium) chewable tablet 400 mg of elemental calcium  2 tablet Oral Q4H PRN [place] Bing, MD      . hydrALAZINE (APRESOLINE) 20 MG/ML injection           . hydrALAZINE (APRESOLINE) injection 10 mg  10 mg Intravenous Q20 Min PRN Lise Auer, MD   10 mg at 10/27/17 1730  . labetalol (NORMODYNE) tablet 400 mg  400 mg Oral TID Degele, Kandra Nicolas, MD   400 mg at 10/27/17 1607  . lactated ringers infusion 500-1,000 mL  500-1,000 mL Intravenous PRN Degele, Kandra Nicolas, MD      . lidocaine (PF) (XYLOCAINE) 1 % injection 30 mL  30 mL Subcutaneous PRN Degele, Kandra Nicolas, MD      .  NIFEdipine (PROCARDIA-XL/ADALAT CC) 24 hr tablet 60 mg  60 mg Oral Daily Degele, Kandra Nicolas, MD   60 mg at 10/27/17 1230  . ondansetron (ZOFRAN) injection 4 mg  4 mg Intravenous Q6H PRN Degele, Kandra Nicolas, MD      . oxyCODONE-acetaminophen (PERCOCET/ROXICET) 5-325 MG per tablet 1 tablet  1 tablet Oral Q4H PRN Degele, Kandra Nicolas, MD      . oxyCODONE-acetaminophen (PERCOCET/ROXICET) 5-325 MG per tablet 2 tablet  2 tablet Oral Q4H PRN Degele, Kandra Nicolas, MD      . oxytocin (PITOCIN) IV BOLUS FROM BAG  500 mL Intravenous Once Degele, Kandra Nicolas, MD      . oxytocin (PITOCIN) IV infusion 40 units in LR 1000 mL - Premix  2.5 Units/hr Intravenous Continuous Degele, Kandra Nicolas, MD      . Melene Muller ON 10/28/2017] polyethylene glycol (MIRALAX / GLYCOLAX) packet 17 g  17 g Oral Daily [place] Bing, MD      . Melene Muller ON 10/28/2017] prenatal multivitamin tablet 1 tablet  1 tablet Oral Q1200 [place] Bing, MD      . sodium citrate-citric acid (ORACIT) solution 30 mL  30 mL Oral  Q2H PRN Degele, Kandra Nicolas, MD      . sodium phosphate (FLEET) 7-19 GM/118ML enema 1 enema  1 enema Rectal PRN Degele, Kandra Nicolas, MD      . zolpidem (AMBIEN) tablet 5 mg  5 mg Oral QHS PRN [place] Bing, MD       Imaging: 4/29: cephalic, normal AFI 4/17: 57%, 2400gm, ac 62%  Assessment & Plan:  Pt improved *IUP: fetal status reassuring. Bid NSTs *cHTN: pt states she last took any home bp meds yesterday morning. I told her that we are trying to figure out if she is poorly controlled with medications or not; also what makes that difficult is that she was poorly controlled at her NOB in the first trimester. I told her that we put her on all of her home bp meds but had to give her IV meds until they could fully take affect. Will continue her bp medication and increase her labetalol 600 tid starting tonight and see how she does with that. Pre-x precautions given and will do q4h BPs. I told her that if her BPs become severe how they were this morning then would call her superimposed pre-eclampsia with severe features and start mg and move towards delivery. If her bps stay stable o/n, then I told her i'd recommend an mfm consult tomorrow to see as to when to move towards delivery but if they say keep her pregnant, I'd recommend inpatient until delivery *Preterm: BMZ #2 at 1330 on 4/30. NICU aware. May have to transfer out if for delivery with census there still high *BMI 50s: hold off on lovenox given could be for delivery.  *GBS bacteruria: treat in labor/for delivery *Analgesia: no current needs *FEN/GI: SLIV, regular diet *PPx: OOB ad lib, SCDs  Cornelia Copa. MD Attending Center for [facility])

## 2017-10-27 NOTE — Progress Notes (Signed)
Pt states she has not taken BP meds yet today. She has been taking the incorrect dose of Nifedipine (has been taking 2 tabs BID)  [place] for growth done 4/17, next scheduled 5/13

## 2017-10-27 NOTE — H&P (Addendum)
LABOR AND DELIVERY ADMISSION HISTORY AND PHYSICAL NOTE  Shirley [name] is a 27 y.o. female [phone] with IUP at [redacted]w[redacted]d by LMP presenting for severely elevated blood pressures into the 200s/100s in clinic.  History is notable for chronic hypertension with medical non-compliance. She was evaluated in the office today and found to have severe range blood pressures. She has been directly admitted for further monitoring and stabilization of her blood pressure.  She does not have any headache, blurry vision, chest pain, shortness of breath, abdominal pain or worsening lower extremity edema.   She is supposed to be taking labetalol  TID and Nifedipine 30 mg PO BID. She reports she is taking labetalol as prescribed and nifedipine 60 mg BID instead, but has note taken any meds today.  She reports positive fetal movement. She denies leakage of fluid or vaginal bleeding.  Prenatal History/Complications: Chronic hypertension- poorly controlled with medication non-adherence Obesity (BMI 52) Spinal stenosis with chronic lower back pain GBS Bacteriuria  [facility]: [facility]  Sonogram:  Last completed 10/27/17- BPP 8/8 with cephalic presentation  10/15/17:  Cephalic presentation AFI 18 (53%) Left lateral placenta, above cervical os EFW 2422 g (57%)  Past Medical History: Past Medical History:  Diagnosis Date  . Arthritis   . Hypertension   . Obesity   . Sciatica     Past Surgical History: Past Surgical History:  Procedure Laterality Date  . TOOTH EXTRACTION N/A 10/22/2013   Procedure: DENTAL EXTRACTIONS TEETH #1, 16, 17, 32;  Surgeon: Georgia Lopes, DDS;  Location: [facility] OR;  Service: Oral Surgery;  Laterality: N/A;    Obstetrical History: OB History    Gravida  4   Para  2   Term  2   Preterm      AB  1   Living  2     SAB      TAB  1   Ectopic      Multiple      Live Births  2           Social History: Social History   Socioeconomic History  . Marital status:  Single    Spouse name: Not on file  . Number of children: Not on file  . Years of education: Not on file  . Highest education level: Not on file  Occupational History  . Not on file  Social Needs  . Financial resource strain: Not on file  . Food insecurity:    Worry: Not on file    Inability: Not on file  . Transportation needs:    Medical: Not on file    Non-medical: Not on file  Tobacco Use  . Smoking status: Former Smoker    Packs/day: 0.50    Types: Cigarettes    Last attempt to quit: 01/29/2017    Years since quitting: 0.7  . Smokeless tobacco: Never Used  Substance and Sexual Activity  . Alcohol use: No    Alcohol/week: 0.0 oz    Frequency: Never    Comment: rare  . Drug use: No    Types: Marijuana    Comment: daily usage  . Sexual activity: Yes    Birth control/protection: None  Lifestyle  . Physical activity:    Days per week: Not on file    Minutes per session: Not on file  . Stress: Not on file  Relationships  . Social connections:    Talks on phone: Not on file    Gets together: Not on  file    Attends religious service: Not on file    Active member of club or organization: Not on file    Attends meetings of clubs or organizations: Not on file    Relationship status: Not on file  Other Topics Concern  . Not on file  Social History Narrative  . Not on file    Family History: Family History  Problem Relation Age of Onset  . Hypertension Mother   . Diabetes Maternal Grandmother   . Diabetes Paternal Grandmother   . Stroke Paternal Grandmother     Allergies: No Known Allergies  Facility-Administered Medications Prior to Admission  Medication Dose Route Frequency Provider Last Rate Last Dose  . polyethylene glycol (MIRALAX / GLYCOLAX) packet 17 g  17 g Oral Daily Adam Phenix, MD       Medications Prior to Admission  Medication Sig Dispense Refill Last Dose  . aspirin EC 81 MG tablet Take 1 tablet (81 mg total) by mouth daily. 30 tablet 3  Past Week at Unknown time  . cyclobenzaprine (FLEXERIL) 10 MG tablet Take 1 tablet (10 mg total) by mouth 3 (three) times daily as needed for muscle spasms. 30 tablet 0 Past Week at Unknown time  . labetalol (NORMODYNE) 200 MG tablet Take 2 tablets (400 mg total) by mouth 3 (three) times daily. 180 tablet 2 10/26/2017 at 0800  . NIFEdipine (PROCARDIA XL) 30 MG 24 hr tablet Take 1 tablet (30 mg total) by mouth every 12 (twelve) hours. 30 tablet 2 10/26/2017 at Unknown time  . Prenat-Methylfol-Chol-Fish Oil (PRENATAL + COMPLETE MULTI) 0.267 & 373 MG THPK Take 1 tablet by mouth daily. 120 each 2 10/26/2017 at Unknown time     Review of Systems   All systems reviewed and negative except as stated in HPI  Blood pressure (!) 194/126, pulse 77, resp. rate 18, height  (1.676 m), weight (!) 147.4 kg (325 lb 0.6 oz), last menstrual period 02/15/2017. General appearance: alert, cooperative and no distress Lungs: clear to auscultation bilaterally Heart: regular rate and rhythm Abdomen: soft, non-tender; bowel sounds normal Extremities: No calf swelling or tenderness Presentation: cephalic Fetal monitoring: 135, +accels, - decels, mod variability Uterine activity: irregular; some uterine irritability and occasional contractions. Not appreciated by patient.      Prenatal labs: ABO, Rh: --/--/O POS (04/15 1710) Antibody: NEG (04/15 1710) Rubella: 1.45 (10/05 1559) RPR: Non Reactive (03/20 1107)  HBsAg: Negative (10/05 1559)  HIV: Non Reactive (03/20 1107)  GBS:   Unknown 1 hr Glucola: 105 Genetic screening: Normal Anatomy US: normal; female  Prenatal Transfer Tool  Maternal Diabetes: No Genetic Screening: Normal Maternal Ultrasounds/Referrals: Normal Fetal Ultrasounds or other Referrals:  None Maternal Substance Abuse:  No Significant Maternal Medications:  None Significant Maternal Lab Results: Lab values include: Other: GBS unknown  Results for orders placed or performed in visit on  10/27/17 (from the past 24 hour(s))  POCT urinalysis dip (device)   Collection Time: 10/27/17 10:53 AM  Result Value Ref Range   Glucose, UA NEGATIVE NEGATIVE mg/dL   Bilirubin Urine NEGATIVE NEGATIVE   Ketones, ur NEGATIVE NEGATIVE mg/dL   Specific Gravity, Urine 1.020 1.005 - 1.030   Hgb urine dipstick NEGATIVE NEGATIVE   pH 6.0 5.0 - 8.0   Protein, ur NEGATIVE NEGATIVE mg/dL   Urobilinogen, UA 0.2 0.0 - 1.0 mg/dL   Nitrite NEGATIVE NEGATIVE   Leukocytes, UA SMALL (A) NEGATIVE    Patient Active Problem List   Diagnosis Date  Noted  . Effort syncope 08/28/2017  . GBS bacteriuria 05/09/2017  . Chronic hypertension during pregnancy, antepartum 05/07/2017  . Maternal morbid obesity, antepartum (HCC) 05/07/2017  . Supervision of high risk pregnancy, antepartum 04/04/2017  . Frequent No-show for appointment 12/06/2016  . Osteoarthritis of both knees 09/29/2015  . 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  . Low back pain with right-sided sciatica 09/15/2015  . Obesity, Class III, BMI 40-49.9 (morbid obesity) (HCC) 12/02/2006  . Primary hypertension 08/28/2006    Assessment: NOVELLE ADDAIR is a 27 y.o. [phone] at [redacted]w[redacted]d here for further evaluation and stabilization of elevated blood pressures. Patient with history of chronic hypertension, and needs rule out for superimposed pre-eclampsia.   #Labor: Patient is currently pre-term without regular uterine contractions. Will provide BMZ x 1 at this time, in case patient requires induction of labor. No plan for delivery at this time. GBS culture ordered as well.   #. Elevated blood pressures: Patient with severe range blood pressures upon admission; asymptomatic. CBC, CMP, P:C ratio. Administer home labetalol  PO x 1 and Nifedipine  PO x 1. Will continue to monitor BP q64min and gradually lower blood pressure to prevent fetal hypo-perfusion. Will hold off on magnesium initiation until labs return.    #Pain: None at this time #FWB: Cat I #ID:  GBS unknown. GBS culture ordered.  #MOF: Bottle feeding #MOC: Undecided #Circ:  Outpatient  Lise Auer, MD PGY-3 10/27/2017, 12:29 PM  OB FELLOW HISTORY AND PHYSICAL ATTESTATION I have seen and examined this patient; I agree with above documentation in the resident's note.   Patient with uncontrolled HTN likely 2/2 noncompliance. She is asymptomatic - Admit to L&D for BP stabilization and work up for superimposed pre-eclampsia - Give home po antihypertensives now - Monitor BP closely, may needs IV prn - UPC, CBC, CMP - BMZ x2 q24 - Discussed with patient plan of care. If BP stabilizes, will transfer patient to L&D later today. If signs/sx of SIPE or unable to control BP, may need to proceed with IOL  Frederik Pear, MD OB Fellow 10/27/2017, 12:52 PM

## 2017-10-27 NOTE — Progress Notes (Signed)
Report called to L&D and patient taken to registration

## 2017-10-27 NOTE — Progress Notes (Signed)
   PRENATAL VISIT NOTE  Subjective:  Shirley [name] is a 27 y.o. [phone] at [redacted]w[redacted]d being seen today for ongoing prenatal care.  She is currently monitored for the following issues for this high-risk pregnancy and has Obesity, Class III, BMI 40-49.9 (morbid obesity) (HCC); Primary hypertension; Low back pain with right-sided sciatica; Spinal stenosis of lumbar region with radiculopathy; Osteoarthritis of both knees; Frequent No-show for appointment; Supervision of high risk pregnancy, antepartum; Chronic hypertension during pregnancy, antepartum; Maternal morbid obesity, antepartum (HCC); GBS bacteriuria; and Effort syncope on their problem list.  Patient reports no complaints.  Contractions: Not present. Vag. Bleeding: None.  Movement: Present. Denies leaking of fluid.   The following portions of the patient's history were reviewed and updated as appropriate: allergies, current medications, past family history, past medical history, past social history, past surgical history and problem list. Problem list updated.  Objective:   Vitals:   10/27/17 1037 10/27/17 1047  BP: (!) 191/114 (!) 208/125  Pulse: 72   Weight: (!) 322 lb 11.2 oz (146.4 kg)     Fetal Status: Fetal Heart Rate (bpm): NST   Movement: Present     General:  Alert, oriented and cooperative. Patient is in no acute distress.  Skin: Skin is warm and dry. No rash noted.   Cardiovascular: Normal heart rate noted  Respiratory: Normal respiratory effort, no problems with respiration noted  Abdomen: Soft, gravid, appropriate for gestational age.  Pain/Pressure: Present     Pelvic: Cervical exam deferred        Extremities: Normal range of motion.  Edema: None  Mental Status: Normal mood and affect. Normal behavior. Normal judgment and thought content.   Assessment and Plan:  Pregnancy: [id number] at [redacted]w[redacted]d  1. Supervision of high risk pregnancy, antepartum  - GC/Chlamydia probe amp ([place])not at [facility] - polyethylene glycol  (MIRALAX / GLYCOLAX) packet 17 g  2. Chronic hypertension during pregnancy, antepartum Uncontrolled HTN, Dr Erin Fulling informed and she is to go to L&D  3. GBS bacteriuria   Preterm labor symptoms and general obstetric precautions including but not limited to vaginal bleeding, contractions, leaking of fluid and fetal movement were reviewed in detail with the patient. Please refer to After Visit Summary for other counseling recommendations.  Return in about 1 week (around 11/03/2017) for as scheduled.  Future Appointments  Date Time Provider Department Center  11/03/2017  1:15 PM WOC-WOCA NST WOC-WOCA WOC  11/03/2017  2:15 PM Reva Bores, MD WOC-WOCA WOC  11/10/2017  8:30 AM WH-MFC [place] 1 WH-MFCUS MFC-US  11/10/2017  9:15 AM WOC-WOCA NST WOC-WOCA WOC  11/10/2017 10:15 AM Reva Bores, MD [facility]    Scheryl Darter, MD

## 2017-10-27 NOTE — Progress Notes (Addendum)
L&D Note Patient non compliant and didn't take her prescribed labetalol 400 bid or procardia xl 60 qday for at least today. BPs have always been labile and h/o non compliance and pt no showed to her march cardiology appt. Pt asymptomatic. Will give her her home regimen, get labs and see if BPs come down. Will also do bmz but hold off on Mg for now as unclear if pt is poorly controlled on medications or poorly controlled but not taking her medications, as her BPs have been in the severe range since her NOB visit in the first trimester. May have to do IV anti-hypertensives to get her down acutely and then can try and figure out if she needs to be delivered now or can do inpatient management until 37wks.   Cornelia Copa MD Attending Center for [employer] (Faculty Practice) 10/27/2017 Time: 1300

## 2017-10-28 ENCOUNTER — Encounter (HOSPITAL_COMMUNITY): Payer: Self-pay

## 2017-10-28 DIAGNOSIS — O10913 Unspecified pre-existing hypertension complicating pregnancy, third trimester: Secondary | ICD-10-CM

## 2017-10-28 LAB — RPR: RPR Ser Ql: NONREACTIVE

## 2017-10-28 LAB — PROTEIN, URINE, 24 HOUR
Collection Interval-UPROT: 24 hours
Protein, Urine: <6 mg/dL
Urine Total Volume-UPROT: 2750 mL

## 2017-10-28 LAB — CREATININE CLEARANCE, URINE, 24 HOUR
CREATININE 24H UR: 1932 mg/d — AB (ref 600–1800)
CREATININE, URINE: 70.27 mg/dL
Collection Interval-CRCL: 24 hours
Creatinine Clearance: 192 mL/min — ABNORMAL HIGH (ref 75–115)
URINE TOTAL VOLUME-CRCL: 2750 mL

## 2017-10-28 LAB — GC/CHLAMYDIA PROBE AMP (~~LOC~~) NOT AT ARMC
CHLAMYDIA, DNA PROBE: NEGATIVE
Neisseria Gonorrhea: NEGATIVE

## 2017-10-28 MED ORDER — CYCLOBENZAPRINE HCL 10 MG PO TABS
10.0000 mg | ORAL_TABLET | Freq: Three times a day (TID) | ORAL | Status: DC | PRN
  Administered 2017-10-28 (×2): 10 mg via ORAL
  Filled 2017-10-28 (×3): qty 1

## 2017-10-28 NOTE — Progress Notes (Signed)
Patient ID: Shirley [name], female   DOB: [birth year], 27 y.o.   MRN: [id number] ACULTY PRACTICE ANTEPARTUM COMPREHENSIVE PROGRESS NOTE  SPRUHA WEIGHT is a 27 y.o. [id number] at [redacted]w[redacted]d  who is admitted for uncontrolled HTN in pregnancy.   Fetal presentation is cephalic. Length of Stay:  1  Days  Subjective: Pt without complaints. Patient reports good fetal movement.  She reports no uterine contractions, no bleeding and no loss of fluid per vagina.  Vitals:  Blood pressure (!) 148/87, pulse 85, temperature 97.7 F (36.5 C), temperature source Oral, resp. rate 18, height  (1.676 m), weight (!) 325 lb 0.6 oz (147.4 kg), last menstrual period 02/15/2017, SpO2 100 %. Physical Examination: General appearance - alert, well appearing, and in no distress Chest - clear to auscultation, no wheezes, rales or rhonchi, symmetric air entry Heart - normal rate, regular rhythm, normal S1, S2, no murmurs, rubs, clicks or gallops Abdomen - soft, nontender, nondistended, no masses or organomegaly gravid Extremities - no pedal edema noted Cervical Exam: Not evaluated.  Membranes:intact  Fetal Monitoring:  Baseline: 140's bpm, Variability: Good {> 6 bpm), Accelerations: Reactive and Decelerations: Absent  Labs:  Results for orders placed or performed during the hospital encounter of 10/27/17 (from the past 24 hour(s))  CBC   Collection Time: 10/27/17 12:24 PM  Result Value Ref Range   WBC 8.9 4.0 - 10.5 K/uL   RBC 4.18 3.87 - 5.11 MIL/uL   Hemoglobin 11.1 (L) 12.0 - 15.0 g/dL   HCT 14.7 (L) 82.9 - 56.2 %   MCV 80.4 78.0 - 100.0 fL   MCH 26.6 26.0 - 34.0 pg   MCHC 33.0 30.0 - 36.0 g/dL   RDW 13.0 86.5 - 78.4 %   Platelets 278 150 - 400 K/uL  RPR   Collection Time: 10/27/17 12:24 PM  Result Value Ref Range   RPR Ser Ql Non Reactive Non Reactive  Comprehensive metabolic panel   Collection Time: 10/27/17 12:24 PM  Result Value Ref Range   Sodium 136 135 - 145 mmol/L   Potassium 4.0 3.5 - 5.1  mmol/L   Chloride 108 101 - 111 mmol/L   CO2 19 (L) 22 - 32 mmol/L   Glucose, Bld 82 65 - 99 mg/dL   BUN 7 6 - 20 mg/dL   Creatinine, Ser 6.96 0.44 - 1.00 mg/dL   Calcium 8.9 8.9 - 29.5 mg/dL   Total Protein 6.7 6.5 - 8.1 g/dL   Albumin 2.4 (L) 3.5 - 5.0 g/dL   AST 15 15 - 41 U/L   ALT 8 (L) 14 - 54 U/L   Alkaline Phosphatase 182 (H) 38 - 126 U/L   Total Bilirubin 0.4 0.3 - 1.2 mg/dL   GFR calc non Af Amer >60 >60 mL/min   GFR calc Af Amer >60 >60 mL/min   Anion gap 9 5 - 15  Protein / creatinine ratio, urine   Collection Time: 10/27/17 12:24 PM  Result Value Ref Range   Creatinine, Urine 102.00 mg/dL   Total Protein, Urine 10 mg/dL   Protein Creatinine Ratio 0.10 0.00 - 0.15 mg/mg[Cre]  Type and screen [facility] [place]   Collection Time: 10/27/17 12:24 PM  Result Value Ref Range   ABO/RH(D) O POS    Antibody Screen NEG    Sample Expiration      10/30/2017 Performed at [facility], [address], [place], [place] [address]   Results for orders placed or performed in visit on  10/27/17 (from the past 24 hour(s))  POCT urinalysis dip (device)   Collection Time: 10/27/17 10:53 AM  Result Value Ref Range   Glucose, UA NEGATIVE NEGATIVE mg/dL   Bilirubin Urine NEGATIVE NEGATIVE   Ketones, ur NEGATIVE NEGATIVE mg/dL   Specific Gravity, Urine 1.020 1.005 - 1.030   Hgb urine dipstick NEGATIVE NEGATIVE   pH 6.0 5.0 - 8.0   Protein, ur NEGATIVE NEGATIVE mg/dL   Urobilinogen, UA 0.2 0.0 - 1.0 mg/dL   Nitrite NEGATIVE NEGATIVE   Leukocytes, UA SMALL (A) NEGATIVE    Imaging Studies:    10/15/2017 Impression  Single living intrauterine pregnancy at 34w 4d.  Cephalic presentation.  Normal appearing fetal growth with EFW at the 57th centile  Normal amniotic fluid volume with an AFI >18 cm  BPP 8/8.  Medications:  Scheduled . betamethasone acetate-betamethasone sodium phosphate  12 mg Intramuscular Q24 Hr x 2  . labetalol  400 mg Oral TID  . NIFEdipine  60  mg Oral Daily  . polyethylene glycol  17 g Oral Daily  . prenatal multivitamin  1 tablet Oral Q1200   I have reviewed the patient's current medications.  ASSESSMENT: Patient Active Problem List   Diagnosis Date Noted  . Chronic hypertension affecting pregnancy 10/27/2017  . Effort syncope 08/28/2017  . GBS bacteriuria 05/09/2017  . Chronic hypertension during pregnancy, antepartum 05/07/2017  . Maternal morbid obesity, antepartum (HCC) 05/07/2017  . Supervision of high risk pregnancy, antepartum 04/04/2017  . Frequent No-show for appointment 12/06/2016  . Osteoarthritis of both knees 09/29/2015  . 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  . Low back pain with right-sided sciatica 09/15/2015  . Obesity, Class III, BMI 40-49.9 (morbid obesity) (HCC) 12/02/2006  . Primary hypertension 08/28/2006    PLAN: Keep current BP meds deliver at 37 weeks Complete BMZ 2nd dose today Deliver sooner for fetal or maternal indications Continue routine antenatal care.   Raza Bayless [name] 10/28/2017,8:38 AM

## 2017-10-29 ENCOUNTER — Other Ambulatory Visit: Payer: Self-pay | Admitting: Advanced Practice Midwife

## 2017-10-29 DIAGNOSIS — Z9119 Patient's noncompliance with other medical treatment and regimen: Secondary | ICD-10-CM

## 2017-10-29 DIAGNOSIS — O09899 Supervision of other high risk pregnancies, unspecified trimester: Secondary | ICD-10-CM

## 2017-10-29 DIAGNOSIS — O169 Unspecified maternal hypertension, unspecified trimester: Secondary | ICD-10-CM

## 2017-10-29 NOTE — Progress Notes (Signed)
Patient ID: Shirley [name], female   DOB: [birth year], 27 y.o.   MRN: [id number] [facility]ULT[facility] PRACTICE ANTEPARTUM COMPREHENSIVE PROGRESS NOTE  Shirley [name] is a 27 y.o. [id number] at [redacted]w[redacted]d  who is admitted for noncompliance and poorly controlle dchronic HTN in pregnancy.   Fetal presentation is cephalic. Length of Stay:  2  Days  Subjective: Pt denies HA or vision changes   Patient reports good fetal movement.  She reports occ uterine contractions, no bleeding and no loss of fluid per vagina.  Vitals:  Blood pressure (!) 148/94, pulse 76, temperature 98.3 F (36.8 C), resp. rate 18, height  (1.676 m), weight (!) 325 lb 0.6 oz (147.4 kg), last menstrual period 02/15/2017, SpO2 100 %. Physical Examination: General appearance - alert, well appearing, and in no distress Chest - clear to auscultation, no wheezes, rales or rhonchi, symmetric air entry Heart - normal rate, regular rhythm, normal S1, S2, no murmurs, rubs, clicks or gallops Abdomen - soft, nontender, nondistended, no masses or organomegaly and gravid  Extremities - peripheral pulses normal, no pedal edema, no clubbing or cyanosis, grvid Cervical Exam: Not evaluated. Membranes:intact  Fetal Monitoring:  Baseline: 150's bpm, Variability: Good {> 6 bpm), Accelerations: Reactive and Decelerations: Absent  Labs:  No results found for this or any previous visit (from the past 24 hour(s)).  Imaging Studies:    None pending   Medications:  Scheduled . labetalol  400 mg Oral TID  . NIFEdipine  60 mg Oral Daily  . polyethylene glycol  17 g Oral Daily  . prenatal multivitamin  1 tablet Oral Q1200   I have reviewed the patient's current medications.  ASSESSMENT: Patient Active Problem List   Diagnosis Date Noted  . Chronic hypertension affecting pregnancy 10/27/2017  . Effort syncope 08/28/2017  . GBS bacteriuria 05/09/2017  . Chronic hypertension during pregnancy, antepartum 05/07/2017  . Maternal morbid obesity, antepartum  (HCC) 05/07/2017  . Supervision of high risk pregnancy, antepartum 04/04/2017  . Frequent No-show for appointment 12/06/2016  . Osteoarthritis of both knees 09/29/2015  . 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  . Low back pain with right-sided sciatica 09/15/2015  . Obesity, Class III, BMI 40-49.9 (morbid obesity) (HCC) 12/02/2006  . Primary hypertension 08/28/2006  This pt has had multiple episode of severe range BPs. She had BPs in the ofc on admission day of diastolic BP >200. She ws not aware of what meds she should be taking. She reports that her lifestyle stressors is limiting her from managing her CHTN at home. This puts her at risk for a placental abruption with loss of the fetus and possible a CVA of the pt. Due to the risk of significant morbidity I rec onpt management until delivery. The pts BPs are controlled on the current meds.  No  S/sx of superimposed preeclampsia but, will closely monitor.      PLAN: IOL 11/01/2017 @ 0730 Keep in hosp until delivery +GBS in urine  Continue routine antenatal care. NST q shift.  Labetolol 400itd Procardia  XL daily deliver sooner for maternal or fetal indications.   Sabastion Hrdlicka [name] 10/29/2017,9:36 AM

## 2017-10-30 ENCOUNTER — Inpatient Hospital Stay (HOSPITAL_COMMUNITY): Payer: Medicaid Other

## 2017-10-30 ENCOUNTER — Encounter (HOSPITAL_COMMUNITY): Payer: Self-pay

## 2017-10-30 DIAGNOSIS — O163 Unspecified maternal hypertension, third trimester: Secondary | ICD-10-CM

## 2017-10-30 LAB — COMPREHENSIVE METABOLIC PANEL
ALK PHOS: 143 U/L — AB (ref 38–126)
ALT: 11 U/L — AB (ref 14–54)
AST: 17 U/L (ref 15–41)
Albumin: 2.2 g/dL — ABNORMAL LOW (ref 3.5–5.0)
Anion gap: 8 (ref 5–15)
BILIRUBIN TOTAL: 0.3 mg/dL (ref 0.3–1.2)
BUN: 7 mg/dL (ref 6–20)
CALCIUM: 8.2 mg/dL — AB (ref 8.9–10.3)
CHLORIDE: 106 mmol/L (ref 101–111)
CO2: 21 mmol/L — ABNORMAL LOW (ref 22–32)
CREATININE: 0.56 mg/dL (ref 0.44–1.00)
Glucose, Bld: 101 mg/dL — ABNORMAL HIGH (ref 65–99)
Potassium: 3.3 mmol/L — ABNORMAL LOW (ref 3.5–5.1)
Sodium: 135 mmol/L (ref 135–145)
TOTAL PROTEIN: 6.2 g/dL — AB (ref 6.5–8.1)

## 2017-10-30 LAB — CBC
HEMATOCRIT: 31.9 % — AB (ref 36.0–46.0)
Hemoglobin: 10.2 g/dL — ABNORMAL LOW (ref 12.0–15.0)
MCH: 26.2 pg (ref 26.0–34.0)
MCHC: 32 g/dL (ref 30.0–36.0)
MCV: 82 fL (ref 78.0–100.0)
Platelets: 270 10*3/uL (ref 150–400)
RBC: 3.89 MIL/uL (ref 3.87–5.11)
RDW: 14.7 % (ref 11.5–15.5)
WBC: 13.8 10*3/uL — ABNORMAL HIGH (ref 4.0–10.5)

## 2017-10-30 MED ORDER — HYDRALAZINE HCL 20 MG/ML IJ SOLN
10.0000 mg | Freq: Once | INTRAMUSCULAR | Status: AC
  Administered 2017-10-30: 10 mg via INTRAVENOUS
  Filled 2017-10-30: qty 1

## 2017-10-30 MED ORDER — HYDRALAZINE HCL 20 MG/ML IJ SOLN
10.0000 mg | Freq: Once | INTRAMUSCULAR | Status: AC
Start: 2017-10-30 — End: 2017-10-30
  Administered 2017-10-30: 10 mg via INTRAVENOUS
  Filled 2017-10-30: qty 1

## 2017-10-30 MED ORDER — NIFEDIPINE ER OSMOTIC RELEASE 30 MG PO TB24
60.0000 mg | ORAL_TABLET | Freq: Two times a day (BID) | ORAL | Status: DC
  Administered 2017-10-30 – 2017-11-01 (×5): 60 mg via ORAL
  Filled 2017-10-30 (×2): qty 2
  Filled 2017-10-30 (×3): qty 1
  Filled 2017-10-30: qty 2

## 2017-10-30 MED ORDER — LABETALOL HCL 200 MG PO TABS
600.0000 mg | ORAL_TABLET | Freq: Three times a day (TID) | ORAL | Status: DC
  Administered 2017-10-30 – 2017-11-01 (×7): 600 mg via ORAL
  Filled 2017-10-30 (×7): qty 3

## 2017-10-30 NOTE — Progress Notes (Signed)
Patient ID: Shirley [name], female   DOB: 11-23-1990, 27 y.o.   MRN: [id number] [facility] PRACTICE ANTEPARTUM COMPREHENSIVE PROGRESS NOTE  EMILEIGH KELLETT is a 27 y.o. [id number] at [redacted]w[redacted]d  who is admitted for uncontrolled chronic HTN   Fetal presentation is cephalic. Length of Stay:  3  Days  Subjective: Pt denies HA or visual changes or SOB  Patient reports good fetal movement.  She reports no uterine contractions, no bleeding and no loss of fluid per vagina.  Vitals:  Blood pressure (!) 144/72, pulse 91, temperature (!) 97.4 F (36.3 C), temperature source Oral, resp. rate 18, height  (1.676 m), weight (!) 325 lb 0.6 oz (147.4 kg), last menstrual period 02/15/2017, SpO2 100 %. Physical Examination: CONSTITUTIONAL: Well-developed, well-nourished female in no acute distress. Pt was asleep when I entered HENT:  Normocephalic, atraumatic EYES: Conjunctivae and EOM are normal. No scleral icterus.  NECK: Normal range of motion SKIN: Skin is warm and dry. No rash noted. Not diaphoretic.No pallor. NEUROLGIC: Alert and oriented to person, place, and time. Normal coordination.  Extremities: extremities normal, atraumatic, no cyanosis or edema  Membranes:intact  Fetal Monitoring:  Baseline: 140's bpm, Variability: Good {> 6 bpm), Accelerations: Reactive and Decelerations: Absent  Labs:  Results for orders placed or performed during the hospital encounter of 10/27/17 (from the past 24 hour(s))  CBC   Collection Time: 10/30/17  8:57 AM  Result Value Ref Range   WBC 13.8 (H) 4.0 - 10.5 K/uL   RBC 3.89 3.87 - 5.11 MIL/uL   Hemoglobin 10.2 (L) 12.0 - 15.0 g/dL   HCT 13.0 (L) 86.5 - 78.4 %   MCV 82.0 78.0 - 100.0 fL   MCH 26.2 26.0 - 34.0 pg   MCHC 32.0 30.0 - 36.0 g/dL   RDW 69.6 29.5 - 28.4 %   Platelets 270 150 - 400 K/uL    Imaging Studies:    BPP pending today   Medications:  Scheduled . labetalol  600 mg Oral TID  . NIFEdipine  60 mg Oral BID  . polyethylene glycol  17 g Oral  Daily  . prenatal multivitamin  1 tablet Oral Q1200   I have reviewed the patient's current medications.  ASSESSMENT: Patient Active Problem List   Diagnosis Date Noted  . Pregnancy complicated by noncompliance, antepartum 10/29/2017  . Severe hypertension affecting pregnancy, antepartum 10/29/2017  . Chronic hypertension affecting pregnancy 10/27/2017  . Effort syncope 08/28/2017  . GBS bacteriuria 05/09/2017  . Chronic hypertension during pregnancy, antepartum 05/07/2017  . Maternal morbid obesity, antepartum (HCC) 05/07/2017  . Supervision of high risk pregnancy, antepartum 04/04/2017  . Frequent No-show for appointment 12/06/2016  . Osteoarthritis of both knees 09/29/2015  . 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  . Low back pain with right-sided sciatica 09/15/2015  . Obesity, Class III, BMI 40-49.9 (morbid obesity) (HCC) 12/02/2006  . Primary hypertension 08/28/2006    PLAN: IOL 11/01/2017 @ 0730 Keep in hosp until delivery +GBS in urine  Continue routine antenatal care. NST q shift.  BP increased to severe range overnight change meds to    Labetolol  tid Procardia  XL bid If unable to manage BPs deliver sooner than 5/4 or for other  maternal or fetal indications. Continue routine antenatal care.   Tabor Denham [name] 10/30/2017,10:03 AM

## 2017-10-31 ENCOUNTER — Encounter (HOSPITAL_COMMUNITY): Payer: Self-pay

## 2017-10-31 LAB — TYPE AND SCREEN
ABO/RH(D): O POS
ANTIBODY SCREEN: NEGATIVE

## 2017-10-31 NOTE — Progress Notes (Signed)
Patient ID: Shirley [name], female   DOB: December 05, 1990, 27 y.o.   MRN: [id number] [facility] PRACTICE ANTEPARTUM COMPREHENSIVE PROGRESS NOTE  Shirley [name] is a 27 y.o. [id number] at [redacted]w[redacted]d  who is admitted for uncontrolled chronic HTN.   Fetal presentation is cephalic. Length of Stay:  4  Days  Subjective: Pt with no complaints.  Patient reports good fetal movement.  She reports occ uterine contractions, no bleeding and no loss of fluid per vagina.  Vitals:  Blood pressure 127/71, pulse 89, temperature 98.7 F (37.1 C), temperature source Oral, resp. rate 20, height  (1.676 m), weight (!) 325 lb 0.6 oz (147.4 kg), last menstrual period 02/15/2017, SpO2 97 %. Physical Examination: General appearance - alert, well appearing, and in no distress Abdomen - soft, nontender, nondistended, no masses or organomegaly gravid Extremities - peripheral pulses normal, no pedal edema, no clubbing or cyanosis Cervical Exam: Evaluated by digital exam., Position: posterior, Dilation: 1cm, Thickness: 1 cm and Consistency: firm Floating and fetal presentation is cephalicMembranes:intact  Fetal Monitoring:  Baseline: 140's bpm, Variability: Good {> 6 bpm), Accelerations: Reactive and Decelerations: Absent  Labs:  Results for orders placed or performed during the hospital encounter of 10/27/17 (from the past 24 hour(s))  Comprehensive metabolic panel   Collection Time: 10/30/17 11:14 AM  Result Value Ref Range   Sodium 135 135 - 145 mmol/L   Potassium 3.3 (L) 3.5 - 5.1 mmol/L   Chloride 106 101 - 111 mmol/L   CO2 21 (L) 22 - 32 mmol/L   Glucose, Bld 101 (H) 65 - 99 mg/dL   BUN 7 6 - 20 mg/dL   Creatinine, Ser 1.09 0.44 - 1.00 mg/dL   Calcium 8.2 (L) 8.9 - 10.3 mg/dL   Total Protein 6.2 (L) 6.5 - 8.1 g/dL   Albumin 2.2 (L) 3.5 - 5.0 g/dL   AST 17 15 - 41 U/L   ALT 11 (L) 14 - 54 U/L   Alkaline Phosphatase 143 (H) 38 - 126 U/L   Total Bilirubin 0.3 0.3 - 1.2 mg/dL   GFR calc non Af Amer >60 >60 mL/min    GFR calc Af Amer >60 >60 mL/min   Anion gap 8 5 - 15  Type and screen   Collection Time: 10/31/17  5:53 AM  Result Value Ref Range   ABO/RH(D) O POS    Antibody Screen NEG    Sample Expiration      11/03/2017 Performed at [facility], [address] [place], [place] [address]     Imaging Studies:    10/30/2017 Impression  Intrauterine pregnancy at 36+4 weeks with CHTN  Normal amniotic fluid  BPP 8/8  Medications:  Scheduled . labetalol  600 mg Oral TID  . NIFEdipine  60 mg Oral BID  . polyethylene glycol  17 g Oral Daily  . prenatal multivitamin  1 tablet Oral Q1200   I have reviewed the patient's current medications.  ASSESSMENT: Patient Active Problem List   Diagnosis Date Noted  . Pregnancy complicated by noncompliance, antepartum 10/29/2017  . Severe hypertension affecting pregnancy, antepartum 10/29/2017  . Chronic hypertension affecting pregnancy 10/27/2017  . Effort syncope 08/28/2017  . GBS bacteriuria 05/09/2017  . Chronic hypertension during pregnancy, antepartum 05/07/2017  . Maternal morbid obesity, antepartum (HCC) 05/07/2017  . Supervision of high risk pregnancy, antepartum 04/04/2017  . Frequent No-show for appointment 12/06/2016  . Osteoarthritis of both knees 09/29/2015  . 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  . Low  back pain with right-sided sciatica 09/15/2015  . Obesity, Class III, BMI 40-49.9 (morbid obesity) (HCC) 12/02/2006  . Primary hypertension 08/28/2006    PLAN: IOL 11/01/2017 @ 0730 Pt is not favorable. Needs cyctotec. Orders for IOL under sign and held Keep in hosp until delivery +GBS in urine Continue routine antenatal care. NST q shift.  BP increased to severe range overnight change meds to               Labetolol  tid Procardia  XL bid If unable to manage BPs deliver sooner than 5/4 or for other  maternal or fetal indications. Continue routine antenatal care.   [name]  [name] 10/31/2017,10:31 AM

## 2017-10-31 NOTE — Progress Notes (Signed)
Shirley [name] was tearful as we talked about her upcoming induction.  She is ready to "get it over with."  She has very little support and her mother, who is a support to her, is unable to be with her tomorrow while she labors.  She and FOB are having some issues and she is not sure he will be there either.  She has 2 other children, ages 44 and 35, both girls.  Her 29 year old had some difficulties after birth, but is fine now.  I normalized feeling anxious and other emotions around not having the support she wanted and she was able to feel somewhat better by focusing on her baby, Shirley [name].   Chaplain Dyanne Carrel, Bcc Pager, [phone] 2:55 PM

## 2017-11-01 ENCOUNTER — Encounter (HOSPITAL_COMMUNITY): Payer: Self-pay | Admitting: *Deleted

## 2017-11-01 ENCOUNTER — Inpatient Hospital Stay (HOSPITAL_COMMUNITY)
Admit: 2017-11-01 | Discharge: 2017-11-01 | Disposition: A | Discharge status: Home / Self Care | Payer: Self-pay | Attending: Obstetrics and Gynecology | Admitting: Obstetrics and Gynecology

## 2017-11-01 ENCOUNTER — Inpatient Hospital Stay (HOSPITAL_COMMUNITY): Payer: Medicaid Other | Admitting: Anesthesiology

## 2017-11-01 LAB — CBC
HCT: 32.8 % — ABNORMAL LOW (ref 36.0–46.0)
HEMATOCRIT: 32.2 % — AB (ref 36.0–46.0)
Hemoglobin: 10.5 g/dL — ABNORMAL LOW (ref 12.0–15.0)
Hemoglobin: 10.6 g/dL — ABNORMAL LOW (ref 12.0–15.0)
MCH: 26.5 pg (ref 26.0–34.0)
MCH: 26.4 pg (ref 26.0–34.0)
MCHC: 32.6 g/dL (ref 30.0–36.0)
MCHC: 32.3 g/dL (ref 30.0–36.0)
MCV: 81.3 fL (ref 78.0–100.0)
MCV: 81.6 fL (ref 78.0–100.0)
PLATELETS: 295 10*3/uL (ref 150–400)
Platelets: 269 10*3/uL (ref 150–400)
RBC: 3.96 MIL/uL (ref 3.87–5.11)
RBC: 4.02 MIL/uL (ref 3.87–5.11)
RDW: 14.8 % (ref 11.5–15.5)
RDW: 14.7 % (ref 11.5–15.5)
WBC: 12.8 10*3/uL — AB (ref 4.0–10.5)
WBC: 13 10*3/uL — ABNORMAL HIGH (ref 4.0–10.5)

## 2017-11-01 MED ORDER — LIDOCAINE HCL (PF) 1 % IJ SOLN
30.0000 mL | INTRAMUSCULAR | Status: DC | PRN
  Filled 2017-11-01: qty 30

## 2017-11-01 MED ORDER — LACTATED RINGERS IV SOLN: 500.0000 mL | Freq: Once | INTRAVENOUS | Status: DC

## 2017-11-01 MED ORDER — HYDRALAZINE HCL 20 MG/ML IJ SOLN: 10.0000 mg | Freq: Once | INTRAMUSCULAR | Status: DC | PRN

## 2017-11-01 MED ORDER — LIDOCAINE HCL (PF) 1 % IJ SOLN
INTRAMUSCULAR | Status: DC | PRN
  Administered 2017-11-01 (×2): 4 mL via EPIDURAL

## 2017-11-01 MED ORDER — OXYTOCIN 40 UNITS IN LACTATED RINGERS INFUSION - SIMPLE MED
1.0000 m[IU]/min | INTRAVENOUS | Status: DC
  Administered 2017-11-01: 2 m[IU]/min via INTRAVENOUS
  Administered 2017-11-01: 18 m[IU]/min via INTRAVENOUS
  Filled 2017-11-01: qty 1000

## 2017-11-01 MED ORDER — TERBUTALINE SULFATE 1 MG/ML IJ SOLN
0.2500 mg | Freq: Once | INTRAMUSCULAR | Status: DC | PRN
  Filled 2017-11-01: qty 1

## 2017-11-01 MED ORDER — ACETAMINOPHEN 325 MG PO TABS: 650.0000 mg | ORAL_TABLET | ORAL | Status: DC | PRN

## 2017-11-01 MED ORDER — FENTANYL CITRATE (PF) 100 MCG/2ML IJ SOLN: 100.0000 ug | INTRAMUSCULAR | Status: DC | PRN

## 2017-11-01 MED ORDER — OXYTOCIN BOLUS FROM INFUSION
500.0000 mL | Freq: Once | INTRAVENOUS | Status: AC
  Administered 2017-11-02: 500 mL via INTRAVENOUS

## 2017-11-01 MED ORDER — TERBUTALINE SULFATE 1 MG/ML IJ SOLN: 0.2500 mg | Freq: Once | INTRAMUSCULAR | Status: DC | PRN

## 2017-11-01 MED ORDER — PHENYLEPHRINE 40 MCG/ML (10ML) SYRINGE FOR IV PUSH (FOR BLOOD PRESSURE SUPPORT)
80.0000 ug | PREFILLED_SYRINGE | INTRAVENOUS | Status: DC | PRN
  Filled 2017-11-01: qty 10
  Filled 2017-11-01: qty 5

## 2017-11-01 MED ORDER — PHENYLEPHRINE 40 MCG/ML (10ML) SYRINGE FOR IV PUSH (FOR BLOOD PRESSURE SUPPORT)
80.0000 ug | PREFILLED_SYRINGE | INTRAVENOUS | Status: DC | PRN
  Filled 2017-11-01: qty 5

## 2017-11-01 MED ORDER — LACTATED RINGERS IV SOLN: 500.0000 mL | INTRAVENOUS | Status: DC | PRN

## 2017-11-01 MED ORDER — OXYCODONE-ACETAMINOPHEN 5-325 MG PO TABS: 2.0000 | ORAL_TABLET | ORAL | Status: DC | PRN

## 2017-11-01 MED ORDER — OXYTOCIN BOLUS FROM INFUSION: 500.0000 mL | Freq: Once | INTRAVENOUS | Status: DC

## 2017-11-01 MED ORDER — PENICILLIN G POT IN DEXTROSE 60000 UNIT/ML IV SOLN
3.0000 10*6.[IU] | INTRAVENOUS | Status: DC
  Administered 2017-11-01 – 2017-11-02 (×5): 3 10*6.[IU] via INTRAVENOUS
  Filled 2017-11-01 (×6): qty 50

## 2017-11-01 MED ORDER — MISOPROSTOL 25 MCG QUARTER TABLET
25.0000 ug | ORAL_TABLET | ORAL | Status: DC | PRN
  Administered 2017-11-01: 25 ug via VAGINAL
  Filled 2017-11-01 (×3): qty 1

## 2017-11-01 MED ORDER — LABETALOL HCL 5 MG/ML IV SOLN: 20.0000 mg | INTRAVENOUS | Status: DC | PRN

## 2017-11-01 MED ORDER — ONDANSETRON HCL 4 MG/2ML IJ SOLN: 4.0000 mg | Freq: Four times a day (QID) | INTRAMUSCULAR | Status: DC | PRN

## 2017-11-01 MED ORDER — DIPHENHYDRAMINE HCL 50 MG/ML IJ SOLN: 12.5000 mg | INTRAMUSCULAR | Status: DC | PRN

## 2017-11-01 MED ORDER — EPHEDRINE 5 MG/ML INJ
10.0000 mg | INTRAVENOUS | Status: DC | PRN
  Filled 2017-11-01: qty 2

## 2017-11-01 MED ORDER — SOD CITRATE-CITRIC ACID 500-334 MG/5ML PO SOLN: 30.0000 mL | ORAL | Status: DC | PRN

## 2017-11-01 MED ORDER — SODIUM CHLORIDE 0.9 % IV SOLN
5.0000 10*6.[IU] | Freq: Once | INTRAVENOUS | Status: AC
  Administered 2017-11-01: 5 10*6.[IU] via INTRAVENOUS
  Filled 2017-11-01: qty 5

## 2017-11-01 MED ORDER — AMPICILLIN SODIUM 2 G IJ SOLR
2.0000 g | Freq: Once | INTRAMUSCULAR | Status: AC
  Administered 2017-11-01: 2 g via INTRAVENOUS
  Filled 2017-11-01: qty 2

## 2017-11-01 MED ORDER — LACTATED RINGERS IV SOLN: INTRAVENOUS | Status: DC

## 2017-11-01 MED ORDER — OXYTOCIN 40 UNITS IN LACTATED RINGERS INFUSION - SIMPLE MED: 2.5000 [IU]/h | INTRAVENOUS | Status: DC

## 2017-11-01 MED ORDER — BUTORPHANOL TARTRATE 1 MG/ML IJ SOLN
1.0000 mg | INTRAMUSCULAR | Status: DC | PRN
  Administered 2017-11-01: 1 mg via INTRAVENOUS
  Filled 2017-11-01: qty 1

## 2017-11-01 MED ORDER — OXYCODONE-ACETAMINOPHEN 5-325 MG PO TABS
1.0000 | ORAL_TABLET | ORAL | Status: DC | PRN
Start: 2017-11-01 — End: 2017-11-02

## 2017-11-01 MED ORDER — LIDOCAINE HCL (PF) 1 % IJ SOLN: 30.0000 mL | INTRAMUSCULAR | Status: DC | PRN

## 2017-11-01 MED ORDER — OXYCODONE-ACETAMINOPHEN 5-325 MG PO TABS: 1.0000 | ORAL_TABLET | ORAL | Status: DC | PRN

## 2017-11-01 MED ORDER — LACTATED RINGERS IV SOLN
INTRAVENOUS | Status: DC
  Administered 2017-11-01 (×3): via INTRAVENOUS

## 2017-11-01 MED ORDER — FENTANYL 2.5 MCG/ML BUPIVACAINE 1/10 % EPIDURAL INFUSION (WH - ANES)
14.0000 mL/h | INTRAMUSCULAR | Status: DC | PRN
  Administered 2017-11-01 – 2017-11-02 (×2): 14 mL/h via EPIDURAL
  Filled 2017-11-01 (×2): qty 100

## 2017-11-01 NOTE — Progress Notes (Signed)
Transferred to room 173 via wheelchair for IOL. Report given.

## 2017-11-01 NOTE — Progress Notes (Signed)
Patient ID: Shirley [name], female   DOB: 1990/08/25, 27 y.o.   MRN: [id number] Doing well, starting to breathe through contactions  Vitals:   11/01/17 1830 11/01/17 1900 11/01/17 1933 11/01/17 2003  BP: 125/77 136/73 130/73 132/85  Pulse: 73 74 73 77  Resp: Temp:    97.7 F (36.5 C)  TempSrc:    Oral  SpO2:      Weight:      Height:       FHR reactive UCs every 2-3 min  Dilation: 3 Effacement (%): 70 Cervical Position: Middle Station: Ballotable Presentation: Vertex Exam by:: dr [email or web address]  Will continue to observe

## 2017-11-01 NOTE — Anesthesia Procedure Notes (Signed)
Epidural Patient location during procedure: OB Start time: 11/01/2017 11:15 PM  Staffing Anesthesiologist: Mal Amabile, MD Performed: anesthesiologist   Preanesthetic Checklist Completed: patient identified, site marked, surgical consent, pre-op evaluation, timeout performed, IV checked, risks and benefits discussed and monitors and equipment checked  Epidural Patient position: sitting Prep: site prepped and draped and DuraPrep Patient monitoring: continuous pulse ox and blood pressure Approach: midline Location: L3-L4 Injection technique: LOR air  Needle:  Needle type: Tuohy  Needle gauge: 17 G Needle length: 9 cm and 9 Needle insertion depth: 9 cm Catheter type: closed end flexible Catheter size: 19 Gauge Catheter at skin depth: 15 cm Test dose: negative and Other  Assessment Events: blood not aspirated, injection not painful, no injection resistance, negative IV test and no paresthesia  Additional Notes Patient identified. Risks and benefits discussed including failed block, incomplete  Pain control, post dural puncture headache, nerve damage, paralysis, blood pressure Changes, nausea, vomiting, reactions to medications-both toxic and allergic and post Partum back pain. All questions were answered. Patient expressed understanding and wished to proceed. Sterile technique was used throughout procedure. Epidural site was Dressed with sterile barrier dressing. No paresthesias, signs of intravascular injection Or signs of intrathecal spread were encountered.  Patient was more comfortable after the epidural was dosed. Please see RN's note for documentation of vital signs and FHR which are stable.

## 2017-11-01 NOTE — Anesthesia Pain Management Evaluation Note (Signed)
  CRNA Pain Management Visit Note  Patient: Shirley [name], 27 y.o., female  "Hello I am a member of the anesthesia team at [facility]. We have an anesthesia team available at all times to provide care throughout the hospital, including epidural management and anesthesia for C-section. I don't know your plan for the delivery whether it a natural birth, water birth, IV sedation, nitrous supplementation, doula or epidural, but we want to meet your pain goals."   1.Was your pain managed to your expectations on prior hospitalizations?   No   2.What is your expectation for pain management during this hospitalization?     Epidural  3.How can we help you reach that goal? Support prn  Record the patient's initial score and the patient's pain goal.   Pain: 2  Pain Goal: 5 The [facility] wants you to be able to say your pain was always managed very well.  [facility] 11/01/2017

## 2017-11-01 NOTE — Progress Notes (Signed)
Discussed vbls/occasional late decels (fhr tracing reviewed) with Dr Nira Retort, BP's reviewed. To hold current Labetalol for now.

## 2017-11-01 NOTE — Anesthesia Preprocedure Evaluation (Signed)
Anesthesia Evaluation  Patient identified by MRN, date of birth, ID band Patient awake    Reviewed: Allergy & Precautions, NPO status , Patient's Chart, lab work & pertinent test results, reviewed documented beta blocker date and time   Airway Mallampati: III  TM Distance: >3 FB Neck ROM: Full    Dental no notable dental hx. (+) Teeth Intact   Pulmonary former smoker,    Pulmonary exam normal breath sounds clear to auscultation       Cardiovascular hypertension, Pt. on medications and Pt. on home beta blockers Normal cardiovascular exam Rhythm:Regular Rate:Normal     Neuro/Psych negative neurological ROS  negative psych ROS   GI/Hepatic Neg liver ROS, GERD  ,  Endo/Other  Morbid obesity  Renal/GU negative Renal ROS  negative genitourinary   Musculoskeletal  (+) Arthritis , Sciatica   Abdominal (+) + obese,   Peds  Hematology  (+) anemia ,   Anesthesia Other Findings   Reproductive/Obstetrics                             Anesthesia Physical Anesthesia Plan  ASA: III  Anesthesia Plan: Epidural   Post-op Pain Management:    Induction:   PONV Risk Score and Plan:   Airway Management Planned: Natural Airway  Additional Equipment:   Intra-op Plan:   Post-operative Plan:   Informed Consent: I have reviewed the patients History and Physical, chart, labs and discussed the procedure including the risks, benefits and alternatives for the proposed anesthesia with the patient or authorized representative who has indicated his/her understanding and acceptance.     Plan Discussed with: Anesthesiologist  Anesthesia Plan Comments:         Anesthesia Quick Evaluation

## 2017-11-01 NOTE — Progress Notes (Signed)
LABOR PROGRESS NOTE  Shirley [name] is a 27 y.o. [phone] at [redacted]w[redacted]d  admitted for uncontrolled HTN  Subjective: Patient feeling contractions, but not much stronger than prior.   Objective: BP 133/83   Pulse 74   Temp 98.3 F (36.8 C) (Oral)   Resp (P) 20   Ht  (1.676 m)   Wt (!) 325 lb 0.6 oz (147.4 kg)   LMP 02/15/2017 (Exact Date)   SpO2 95%   BMI 52.46 kg/m  or  Vitals:   11/01/17 1630 11/01/17 1700 11/01/17 1730 11/01/17 1758  BP: 136/71 120/75 133/83   Pulse: 73 73 74   Resp: (P) 20  Temp:      TempSrc:      SpO2:      Weight:      Height:        SVE: 3cm/30%/-3, bollatoble FHT: baseline rate 140, moderate  varibility, +acel, occasional variable decel Toco: ctx q 1-3 min   Assessment / Plan: 27 y.o. [id number] at [redacted]w[redacted]d here for IOL for uncontrolled CHTN  Labor: Not yet in labor Continue to titrate Pitocin Fetal Wellbeing:  Cat I Pain Control:  Per patient's request, planning on getting epidural Anticipated MOD:  SVD  Frederik Pear, MD 11/01/2017, 5:59 PM

## 2017-11-01 NOTE — Progress Notes (Signed)
LABOR PROGRESS NOTE  SHEREL FENNELL is a 27 y.o. [phone] at [redacted]w[redacted]d  admitted for uncontrolled HTN  Subjective: Patient feeling contractions.   Objective: BP 140/88   Pulse 79   Temp 98 F (36.7 C) (Oral)   Resp 18   Ht  (1.676 m)   Wt (!) 325 lb 0.6 oz (147.4 kg)   LMP 02/15/2017 (Exact Date)   SpO2 95%   BMI 52.46 kg/m  or  Vitals:   11/01/17 1200 11/01/17 1230 11/01/17 1300 11/01/17 1342  BP: (!) 145/85 (!) 143/85 140/88   Pulse: 75 74 79   Resp: Temp: 98.2 F (36.8 C)  98 F (36.7 C)   TempSrc: Oral  Oral   SpO2:      Weight:      Height:        Last SVE: Dilation: 3 Effacement (%): 70 Cervical Position: Middle Station: -2 Presentation: Vertex Exam by:: [email or web address],rnc-ob(pt requested to be examined to decide on epidural) FHT: baseline rate 135, moderate  varibility, +acel, no decel Toco: ctx q 1-2 min   Assessment / Plan: 27 y.o. [id number] at [redacted]w[redacted]d here for IOL for uncontrolled CHTN  Labor: s/p cytotec for cervical ripening. Now on IV Pitocin, started on 11:30 am Fetal Wellbeing:  Cat I Pain Control:  Per patient's request, planning on getting epidural Anticipated MOD:  SVD  Frederik Pear, MD 11/01/2017, 2:00 PM

## 2017-11-01 NOTE — Progress Notes (Signed)
Shirley [name] is a 27 y.o. [id number] at [redacted]w[redacted]d by LMP admitted for induction of labor due to Hypertension.  Subjective: Shirley [name] is a 27 y.o. female [phone] with IUP at [redacted]w[redacted]d by LMP presenting for severely elevated blood pressures into the 200s/100s in clinic. Patient was started on her home blood pressure medications and remained on the antepartum unit until she reached 37 weeks and could undergo induction of labor.    History is notable for chronic hypertension with medical non-compliance. Her home regimen consisted of labetalol  TID and Nifedipine30mg  POBID.  Since admission, she has continued to have elevated blood pressures. Her most recent medication regimen consists of labetalol  TID and Nifedipine  PO BID while on the antepartum unit with improvement in her blood pressure control.   She reports positive fetal movement. She denies leakage of fluid or vaginal bleeding.  Objective: BP (!) 145/89   Pulse 77   Temp 97.9 F (36.6 C) (Axillary)   Resp 18   Ht  (1.676 m)   Wt (!) 147.4 kg (325 lb 0.6 oz)   LMP 02/15/2017 (Exact Date)   SpO2 95%   BMI 52.46 kg/m  No intake/output data recorded. No intake/output data recorded.  FHT:  FHR: 145 bpm, variability: moderate,  accelerations:  Present,  decelerations:  Present occasional variable UC:   Uterine irritability; no regular contractions SVE:   Dilation: Fingertip Effacement (%): Thick Station: -3 Exam by:: k  hutchison, rn  Labs: Lab Results  Component Value Date   WBC 12.8 (H) 11/01/2017   HGB 10.5 (L) 11/01/2017   HCT 32.2 (L) 11/01/2017   MCV 81.3 11/01/2017   PLT 269 11/01/2017    Assessment / Plan: Transferred from antepartum to birthing suite for induction of labor secondary to poorly controlled chronic hypertension  Labor: Bishop score of 1. Cervical ripening with cytotec at this time.  Preeclampsia:  no signs or symptoms of toxicity Fetal Wellbeing:  Category I Pain Control:   Labor support without medications I/D:  GBS positive; penicillin Anticipated MOD:  NSVD  Gorden Harms 11/01/2017, 7:56 AM

## 2017-11-02 ENCOUNTER — Encounter (HOSPITAL_COMMUNITY): Payer: Self-pay | Admitting: *Deleted

## 2017-11-02 LAB — CBC
HCT: 31.4 % — ABNORMAL LOW (ref 36.0–46.0)
Hemoglobin: 10 g/dL — ABNORMAL LOW (ref 12.0–15.0)
MCH: 26.1 pg (ref 26.0–34.0)
MCHC: 31.8 g/dL (ref 30.0–36.0)
MCV: 82 fL (ref 78.0–100.0)
PLATELETS: 278 10*3/uL (ref 150–400)
RBC: 3.83 MIL/uL — ABNORMAL LOW (ref 3.87–5.11)
RDW: 14.8 % (ref 11.5–15.5)
WBC: 13.8 10*3/uL — AB (ref 4.0–10.5)

## 2017-11-02 LAB — RPR: RPR: NONREACTIVE

## 2017-11-02 MED ORDER — ONDANSETRON HCL 4 MG/2ML IJ SOLN: 4.0000 mg | INTRAMUSCULAR | Status: DC | PRN

## 2017-11-02 MED ORDER — DIPHENHYDRAMINE HCL 25 MG PO CAPS: 25.0000 mg | ORAL_CAPSULE | Freq: Four times a day (QID) | ORAL | Status: DC | PRN

## 2017-11-02 MED ORDER — BENZOCAINE-MENTHOL 20-0.5 % EX AERO: 1.0000 "application " | INHALATION_SPRAY | CUTANEOUS | Status: DC | PRN

## 2017-11-02 MED ORDER — IBUPROFEN 600 MG PO TABS
600.0000 mg | ORAL_TABLET | Freq: Four times a day (QID) | ORAL | Status: DC
  Administered 2017-11-02 – 2017-11-04 (×9): 600 mg via ORAL
  Filled 2017-11-02 (×9): qty 1

## 2017-11-02 MED ORDER — ZOLPIDEM TARTRATE 5 MG PO TABS: 5.0000 mg | ORAL_TABLET | Freq: Every evening | ORAL | Status: DC | PRN

## 2017-11-02 MED ORDER — COCONUT OIL OIL: 1.0000 "application " | TOPICAL_OIL | Status: DC | PRN

## 2017-11-02 MED ORDER — SENNOSIDES-DOCUSATE SODIUM 8.6-50 MG PO TABS
2.0000 | ORAL_TABLET | ORAL | Status: DC
  Administered 2017-11-03 (×2): 2 via ORAL
  Filled 2017-11-02 (×2): qty 2

## 2017-11-02 MED ORDER — TETANUS-DIPHTH-ACELL PERTUSSIS 5-2.5-18.5 LF-MCG/0.5 IM SUSP: 0.5000 mL | Freq: Once | INTRAMUSCULAR | Status: DC

## 2017-11-02 MED ORDER — PRENATAL MULTIVITAMIN CH
1.0000 | ORAL_TABLET | Freq: Every day | ORAL | Status: DC
  Administered 2017-11-02 – 2017-11-04 (×3): 1 via ORAL
  Filled 2017-11-02 (×3): qty 1

## 2017-11-02 MED ORDER — ACETAMINOPHEN 325 MG PO TABS
650.0000 mg | ORAL_TABLET | ORAL | Status: DC | PRN
  Administered 2017-11-02 – 2017-11-04 (×4): 650 mg via ORAL
  Filled 2017-11-02 (×4): qty 2

## 2017-11-02 MED ORDER — ONDANSETRON HCL 4 MG PO TABS: 4.0000 mg | ORAL_TABLET | ORAL | Status: DC | PRN

## 2017-11-02 MED ORDER — DIBUCAINE 1 % RE OINT: 1.0000 "application " | TOPICAL_OINTMENT | RECTAL | Status: DC | PRN

## 2017-11-02 MED ORDER — WITCH HAZEL-GLYCERIN EX PADS: 1.0000 "application " | MEDICATED_PAD | CUTANEOUS | Status: DC | PRN

## 2017-11-02 MED ORDER — SIMETHICONE 80 MG PO CHEW: 80.0000 mg | CHEWABLE_TABLET | ORAL | Status: DC | PRN

## 2017-11-02 NOTE — Progress Notes (Signed)
Patient ID: Shirley [name], female   DOB: 06-19-1991, 27 y.o.   MRN: [id number] Comfortable with epidural now Vitals:   11/01/17 2320 11/01/17 2325 11/01/17 2335 11/01/17 2340  BP: (!) 137/97 115/66 104/67 115/77  Pulse: 84 84 78 82  Resp: Temp:      TempSrc:      SpO2: 97% 96% 96% 97%  Weight:      Height:       FHR reassuring UCs not traciing well Have been tripling and coupling  Cervix 3/50-60/Ballotable  I think it is too ballotable to AROM just yet.  Continue Pitocin

## 2017-11-02 NOTE — Anesthesia Postprocedure Evaluation (Signed)
Anesthesia Post Note  Patient: Shirley [name]  Procedure(s) Performed: AN AD HOC LABOR EPIDURAL     Patient location during evaluation: Mother Baby Anesthesia Type: Epidural Level of consciousness: awake and alert, oriented and patient cooperative Pain management: pain level controlled Vital Signs Assessment: post-procedure vital signs reviewed and stable Respiratory status: spontaneous breathing Cardiovascular status: stable Postop Assessment: no headache, epidural receding, patient able to bend at knees and no signs of nausea or vomiting Anesthetic complications: no Comments: Pain score 0.    Last Vitals:  Vitals:   11/02/17 1045 11/02/17 1445  BP: 126/86 (!) 149/88  Pulse: 80 76  Resp: 18 18  Temp: 36.7 C 36.8 C  SpO2:  97%    Last Pain:  Vitals:   11/02/17 1445  TempSrc: Oral  PainSc:    Pain Goal: Patients Stated Pain Goal: 7 (11/01/17 1933)               Merrilyn Puma

## 2017-11-02 NOTE — Progress Notes (Signed)
Patient ID: Shirley [name], female   DOB: 23-Apr-1991, 27 y.o.   MRN: [id number] Doing well but pain is breaking through epidural  Vitals:   11/02/17 0532 11/02/17 0602 11/02/17 0634 11/02/17 0702  BP: 127/79 132/83 105/64 (!) 131/102  Pulse: 73 71 86 86  Resp: Temp:  98 F (36.7 C)    TempSrc:  Oral    SpO2:      Weight:      Height:       FHR reassuring with small variable decels UCs every 2-3 min  Dilation: Lip/rim Effacement (%): 100 Cervical Position: Middle Station: -1 Presentation: Vertex Exam by:: C Fisher RN  Anticipated SVD soon

## 2017-11-02 NOTE — Progress Notes (Signed)
Patient ID: Shirley [name], female   DOB: 14-Sep-1990, 27 y.o.   MRN: [id number] AROM clear fluid FHR reassuring UCs not tracing well IUPC inserted  Vitals:   11/02/17 0135 11/02/17 0205 11/02/17 0235 11/02/17 0305  BP: 128/86 133/70 125/73 123/70  Pulse: 72 65 69 71  Resp: Temp:      TempSrc:      SpO2:      Weight:      Height:       Anticipate increased labor progress

## 2017-11-03 ENCOUNTER — Other Ambulatory Visit: Payer: Self-pay

## 2017-11-03 ENCOUNTER — Encounter: Payer: Self-pay | Admitting: Family Medicine

## 2017-11-03 DIAGNOSIS — Z3A37 37 weeks gestation of pregnancy: Secondary | ICD-10-CM

## 2017-11-03 DIAGNOSIS — O1092 Unspecified pre-existing hypertension complicating childbirth: Secondary | ICD-10-CM

## 2017-11-03 MED ORDER — AMLODIPINE BESYLATE 10 MG PO TABS
10.0000 mg | ORAL_TABLET | Freq: Every day | ORAL | Status: DC
  Administered 2017-11-04: 10 mg via ORAL
  Filled 2017-11-03 (×2): qty 1

## 2017-11-03 MED ORDER — AMLODIPINE BESYLATE 5 MG PO TABS
5.0000 mg | ORAL_TABLET | Freq: Every day | ORAL | Status: DC
  Administered 2017-11-03: 5 mg via ORAL
  Filled 2017-11-03 (×2): qty 1

## 2017-11-03 MED ORDER — AMLODIPINE BESYLATE 5 MG PO TABS
5.0000 mg | ORAL_TABLET | Freq: Once | ORAL | Status: AC
  Administered 2017-11-03: 5 mg via ORAL
  Filled 2017-11-03: qty 1

## 2017-11-03 NOTE — Progress Notes (Addendum)
Called Midwife regarding patient's blood pressure of 159/110. Patient's has no other signs of PIH. Patient states she is concerned that her BP medicine was changed, . Patient states she was upset with the person she was talking to on the phone. Midwife stated to take BP again but not until the patient calms down. Patient is relaxing and not on the phone and will call me to take her BP. In addition, the midwife states she will come to talk to the patient soon.  Midwife stated to call if BP was 160/110 or greater.

## 2017-11-03 NOTE — Progress Notes (Signed)
POSTPARTUM PROGRESS NOTE  Post Partum Day 1  Subjective:  Shirley [name] is a 27 y.o. [phone] s/p SVD at [redacted]w[redacted]d.  She reports she is doing well. No acute events overnight. She denies any problems with ambulating, voiding or po intake. Feeling tired this morning  Objective: Patient Vitals for the past 18 hrs:  BP Temp Temp src Pulse Resp SpO2  11/03/17 0548 138/80 98.1 F (36.7 C) Oral 71 18 -  11/02/17 1445 (!) 149/88 98.2 F (36.8 C) Oral 76 18 97 %    Physical Exam:  General: alert, cooperative and no distress Chest: no respiratory distress Heart:regular rate, distal pulses intact Abdomen: soft, nontender,  Uterine Fundus: firm, appropriately tender DVT Evaluation: No calf swelling or tenderness Extremities: trace edema Skin: warm, dry  Recent Labs    11/01/17 2225 11/02/17 0830  HGB 10.6* 10.0*  HCT 32.8* 31.4*    Assessment/Plan: Shirley [name] is a 27 y.o. [id number] s/p SVD at [redacted]w[redacted]d   PPD#1 - Doing well  Routine postpartum care  CHTN: on Procardia and labetalol during pregnancy. Bps much improved since delivery  Procardia, labetalol discontinued  Start amlodipine 5 mg  Continue to monitor Bps  Contraception: undecided; discuss more later (pt very sleepy this morning) Feeding: breast Dispo: Plan for discharge tomorrow.   LOS: 7 days   Shirley Nicolas DegeleMD 11/03/2017, 7:40 AM

## 2017-11-03 NOTE — Progress Notes (Signed)
Called Rogers CNM regarding patient's blood pressure of 150/116. Also informed CNM that patient refuses to have an IV put in. CNM stated to take BP in other arm. Rn called midwife again with BP which was 134/93. CNM stated not to take BP again until in am.

## 2017-11-03 NOTE — Progress Notes (Signed)
Elevated BP called to Denton Meek, CNM. Patient denies any symptoms of hypertension such as headache, blurred vision, sudden swelling or epigastric. Denies pain. Talking calmly with visitors. Currently resting. Awaiting orders. Denton Meek, CNM will talk with the attending and then place orders.

## 2017-11-04 ENCOUNTER — Telehealth: Payer: Self-pay

## 2017-11-04 MED ORDER — LABETALOL HCL 200 MG PO TABS: 200.0000 mg | ORAL_TABLET | Freq: Two times a day (BID) | ORAL | 0 refills | Status: DC

## 2017-11-04 MED ORDER — LABETALOL HCL 200 MG PO TABS: 200.0000 mg | ORAL_TABLET | Freq: Three times a day (TID) | ORAL | 0 refills | Status: DC

## 2017-11-04 MED ORDER — AMLODIPINE BESYLATE 10 MG PO TABS: 10.0000 mg | ORAL_TABLET | Freq: Every day | ORAL | 1 refills | Status: DC

## 2017-11-04 MED ORDER — LABETALOL HCL 200 MG PO TABS
200.0000 mg | ORAL_TABLET | Freq: Once | ORAL | Status: AC
  Administered 2017-11-04: 200 mg via ORAL
  Filled 2017-11-04: qty 1

## 2017-11-04 NOTE — Telephone Encounter (Signed)
Notified pt of normal results.  Pt stated understanding with no further questions.

## 2017-11-04 NOTE — Progress Notes (Signed)
Patient with persistent blood pressures in 150s-160s systolic throughout the morning. Gave 1 time dose of  labetalol on top of her  norvasc. Check approximately 1.5 hours after administration showed blood pressure 143/88. Ok for discharge with this pressure. Will start on labetolol  tid. Baby love visit in one week to check pressure. Pending how she does medication changes can be made at first PP visit.  Myrene Buddy MD PGY-1 Family Medicine Resident

## 2017-11-04 NOTE — Discharge Summary (Addendum)
OB Discharge Summary     Patient Name: Shirley [name] DOB: 09/30/1990 MRN: [id number]  Date of admission: 10/27/2017 Delivering MD: Aviva Signs   Date of discharge: 11/04/2017  Admitting diagnosis: INDUCTION Intrauterine pregnancy: [redacted]w[redacted]d     Secondary diagnosis:  Principal Problem:   Severe hypertension affecting pregnancy, antepartum Active Problems:   Obesity, Class III, BMI 40-49.9 (morbid obesity) (HCC)   Frequent No-show for appointment   Supervision of high risk pregnancy, antepartum   Chronic hypertension during pregnancy, antepartum   Maternal morbid obesity, antepartum (HCC)   GBS bacteriuria   Chronic hypertension affecting pregnancy   Pregnancy complicated by noncompliance, antepartum  Additional problems: None     Discharge diagnosis: Term Pregnancy Delivered and CHTN                                                                                                Post partum procedures:none  Augmentation: AROM, Pitocin and Cytotec  Complications: None  Hospital course:  Induction of Labor With Vaginal Delivery   27 y.o. yo [phone] at [redacted]w[redacted]d was admitted to the hospital 10/27/2017 for induction of labor.  Indication for induction: Uncontrolled CHTN.  Patient had an uncomplicated labor course as follows: Membrane Rupture Time/Date: 3:49 AM ,11/02/2017   Intrapartum Procedures: Episiotomy: None [1]                                         Lacerations:  None [1]  Patient had delivery of a Viable infant.  Information for the patient's newborn:  Aadhira, Heffernan [[id number]]  Delivery Method: Vaginal, Spontaneous(Filed from Delivery Summary)   11/02/2017  Details of delivery can be found in separate delivery note.  Patient had a routine postpartum course. Patient is discharged home 11/04/17.  Physical exam  Vitals:   11/04/17 0923 11/04/17 0924 11/04/17 1059 11/04/17 1254  BP: (!) 152/113 (!) 152/113 (!) 160/119 (!) 143/88  Pulse:  88 (!) 106 89  Resp:       Temp:      TempSrc:      SpO2:      Weight:      Height:       General: alert, cooperative and no distress Lochia: appropriate Uterine Fundus: firm Incision: N/A DVT Evaluation: No evidence of DVT seen on physical exam. No cords or calf tenderness. Labs: Lab Results  Component Value Date   WBC 13.8 (H) 11/02/2017   HGB 10.0 (L) 11/02/2017   HCT 31.4 (L) 11/02/2017   MCV 82.0 11/02/2017   PLT 278 11/02/2017   CMP Latest Ref Rng & Units 10/30/2017  Glucose 65 - 99 mg/dL 130(Q)  BUN 6 - 20 mg/dL 7  Creatinine 6.57 - 8.46 mg/dL 9.62  Sodium 952 - 841 mmol/L 135  Potassium 3.5 - 5.1 mmol/L 3.3(L)  Chloride 101 - 111 mmol/L 106  CO2 22 - 32 mmol/L 21(L)  Calcium 8.9 - 10.3 mg/dL 8.2(L)  Total Protein 6.5 - 8.1 g/dL 6.2(L)  Total Bilirubin 0.3 -  1.2 mg/dL 0.3  Alkaline Phos 38 - 126 U/L 143(H)  AST 15 - 41 U/L 17  ALT 14 - 54 U/L 11(L)    Discharge instruction: per After Visit Summary and "Baby and Me Booklet".  After visit meds:  Allergies as of 11/04/2017   No Known Allergies     Medication List    STOP taking these medications   aspirin EC 81 MG tablet   NIFEdipine 30 MG 24 hr tablet Commonly known as:  PROCARDIA XL     TAKE these medications   amLODipine 10 MG tablet Commonly known as:  NORVASC Take 1 tablet (10 mg total) by mouth daily.   cyclobenzaprine 10 MG tablet Commonly known as:  FLEXERIL Take 1 tablet (10 mg total) by mouth 3 (three) times daily as needed for muscle spasms.   labetalol 200 MG tablet Commonly known as:  NORMODYNE Take 1 tablet (200 mg total) by mouth 3 (three) times daily. What changed:  how much to take   PRENATAL + COMPLETE MULTI 0.267 & 373 MG Thpk Take 1 tablet by mouth daily.       Diet: routine diet  Activity: Advance as tolerated. Pelvic rest for 6 weeks.   Outpatient follow up:2 weeks Follow up Appt:No future appointments. Follow up Visit:No follow-ups on file.  Postpartum contraception: Undecided  Newborn  Data: Live born female  Birth Weight: 6 lb 7.2 oz (2925 g) APGAR: 9, 9  Newborn Delivery   Birth date/time:  11/02/2017 07:33:00 Delivery type:  Vaginal, Spontaneous     Baby Feeding: Bottle Disposition:home with mother   11/04/2017 Myrene Buddy, MD   Patient with persistently high SBP in morning of 5/7. Labetalol  tid added on top of norvasc. Baby love visit for blood pressure check scheduled in 1 week.  Myrene Buddy MD PGY-1 Family Medicine Resident

## 2017-11-04 NOTE — Telephone Encounter (Signed)
-----   Message from Adam Phenix, MD sent at 11/02/2017  7:36 PM EDT ----- Negative result

## 2017-11-06 ENCOUNTER — Telehealth: Payer: Self-pay | Admitting: Advanced Practice Midwife

## 2017-11-06 NOTE — Telephone Encounter (Signed)
PROCARDIA XL 30 MG 24 HR TABS. ONE Q12 HRS. QUANTITY # 30.  Previous old script one a day instructions. Please clarify instructions. [facility] ON [address] [phone].

## 2017-11-10 ENCOUNTER — Other Ambulatory Visit: Payer: Self-pay

## 2017-11-10 ENCOUNTER — Ambulatory Visit (HOSPITAL_COMMUNITY): Payer: Medicaid Other

## 2017-11-10 ENCOUNTER — Encounter: Payer: Self-pay | Admitting: Obstetrics & Gynecology

## 2017-11-11 ENCOUNTER — Telehealth: Payer: Self-pay | Admitting: Family Medicine

## 2017-11-11 NOTE — Telephone Encounter (Signed)
Called by [facility] for elevated BP. Patient not taking amlodipine  daily as prescribed. Only taking labetalol  BID (instead of TID). Instructed pt to take TID and to start amlodipine. Patient has f/u in office tomorrow.

## 2017-11-12 ENCOUNTER — Ambulatory Visit: Payer: Self-pay

## 2017-11-12 ENCOUNTER — Ambulatory Visit (INDEPENDENT_AMBULATORY_CARE_PROVIDER_SITE_OTHER): Payer: Medicaid Other

## 2017-11-12 ENCOUNTER — Ambulatory Visit (HOSPITAL_COMMUNITY): Payer: Medicaid Other

## 2017-11-12 DIAGNOSIS — Z0130 Encounter for examination of blood pressure without abnormal findings: Secondary | ICD-10-CM

## 2017-11-12 NOTE — Progress Notes (Signed)
Pt here today for BP check.  Per chart review pt was to take Labetalol tid and start Norvasc.  BP taken in [place] manually 180/112.  Spoke with Cleone Slim, CNM who recommended pt to go to [facility]U.  When informed pt to go to MAU pt states that she is not able to go because she has to get back to her children.  I explained to the pt that it is very important that she gets her BP within normal limits because it can be fatal.  I confirmed with Dr. Vergie Living that since pt can not go to MAU can we have her to come in on Friday for BP check and if BP is elevated to be prepared to go MAU.  Provider agreed.  I informed pt to please go pick up the Norvasc and start taking it along with the Labetolol as prescribed.  I reiterated the importance of her taking the medication.  Pt stated that she will go pick up the Norvasc medication today and start.  I informed pt that we will see her on Friday for BP check since she would have the Norvasc in her couple days.  Pt stated understanding with no further questions.

## 2017-11-14 ENCOUNTER — Ambulatory Visit: Payer: Medicaid Other | Admitting: *Deleted

## 2017-11-14 ENCOUNTER — Telehealth: Payer: Self-pay | Admitting: Family Medicine

## 2017-11-14 VITALS — BP 164/110 | HR 69

## 2017-11-14 DIAGNOSIS — Z0130 Encounter for examination of blood pressure without abnormal findings: Secondary | ICD-10-CM

## 2017-11-14 NOTE — Progress Notes (Signed)
I have reviewed the chart and agree with nursing staff's documentation of this patient's encounter. Patient was also offered to go to MAU, and she declined just as she did at her previous visit.  Jaynie Collins, MD 11/14/2017 12:39 PM

## 2017-11-14 NOTE — Progress Notes (Signed)
States has not been able to get the norvasc- said pharmacy waiting to talk to prescriber. Denies headaches, edema, etc.   Reported BP's to Dr. Mila Homer [facility] to discuss medication issue and they stated it was placed on hold; but she wasn't sure why. I informed her patient does need it filled today. They stated they will fill it and she can get it today.    Instructed patient to get norvasc and start it today. Also to go to hospital if she has severe headache, sudden edema, etc. Also to make appt for bp check in 1 week.  Also discussed she had a sad disposition. She states she has a lot of stress on her- encouraged her to make appointment to see Shirley [name] [facility] Monday as she is not here today. She denies thoughts of hurting herself. She voices understanding.

## 2017-11-14 NOTE — Telephone Encounter (Signed)
Pt declined to make an appointment with [facility] as provider advised. Pt states does not want to see Asher Muir.

## 2017-11-17 ENCOUNTER — Other Ambulatory Visit: Payer: Self-pay

## 2017-11-17 ENCOUNTER — Encounter: Payer: Self-pay | Admitting: Family Medicine

## 2017-11-18 NOTE — Progress Notes (Signed)
I have reviewed the chart and agree with nursing staff's documentation of this patient's encounter.  Disaya Walt, MD 11/18/2017 12:52 PM    

## 2017-11-21 ENCOUNTER — Ambulatory Visit: Payer: Self-pay

## 2017-11-21 NOTE — Telephone Encounter (Signed)
Patient was to be seen in the office today regarding her blood pressure.  Tried to contact patient no answer.

## 2017-11-25 ENCOUNTER — Other Ambulatory Visit: Payer: Self-pay

## 2017-11-25 ENCOUNTER — Encounter: Payer: Self-pay | Admitting: Family Medicine

## 2017-12-03 ENCOUNTER — Ambulatory Visit: Payer: Self-pay

## 2017-12-16 ENCOUNTER — Ambulatory Visit: Payer: Self-pay | Admitting: Family Medicine

## 2018-01-05 ENCOUNTER — Ambulatory Visit: Payer: Medicaid Other | Admitting: Family Medicine

## 2018-01-05 NOTE — Progress Notes (Deleted)
   Subjective   Patient ID: Shirley [name]    DOB: [birth year], 27 y.o. female   MRN: [id number]  CC: "***"  HPI: Shirley RepressSacoya M Fennelly is a 27 y.o. female who presents to clinic today for the following:  ***: ***  ***Patient here today for lumbar pain.  Has MRI of lumbar spine showing small central disc protrusion L5-S1 without stenosis or neural impingement, moderate bilateral foraminal narrowing L5-S1 right worse than left, degenerative disc bulge T11-12 without stenosis, mild bilateral facet arthrosis of L4-L5 and L5-S1.  ROS: see HPI for pertinent.  PMFSH: HTN, morbid obesity, OA knees b/l, sciatica neuralgia, spinal stenosis of lumbar w/ radiculopathy. Surgical history wisdom tooth extraction. Family history HTN. Smoking status reviewed. Medications reviewed.  Objective   There were no vitals taken for this visit. Vitals and nursing note reviewed.  General: well nourished, well developed, NAD with non-toxic appearance HEENT: normocephalic, atraumatic, moist mucous membranes Neck: supple, non-tender without lymphadenopathy Cardiovascular: regular rate and rhythm without murmurs, rubs, or gallops Lungs: clear to auscultation bilaterally with normal work of breathing Abdomen: soft, non-tender, non-distended, normoactive bowel sounds Skin: warm, dry, no rashes or lesions, cap refill < 2 seconds Extremities: warm and well perfused, normal tone, no edema  Assessment & Plan   No problem-specific Assessment & Plan notes found for this encounter.  No orders of the defined types were placed in this encounter.  No orders of the defined types were placed in this encounter.   Durward Parcelavid Kimi Kroft, DO [facility], PGY-2 01/05/2018, 8:12 AM

## 2018-01-06 ENCOUNTER — Ambulatory Visit: Payer: Self-pay | Admitting: Advanced Practice Midwife

## 2018-01-07 ENCOUNTER — Ambulatory Visit: Payer: Medicaid Other | Admitting: Family Medicine

## 2018-01-07 NOTE — Progress Notes (Deleted)
   Subjective   Patient ID: Shirley [name]    DOB: 1991/03/17, 27 y.o. female   MRN: [id number]  CC: "***"  HPI: Shirley [name] is a 27 y.o. female who presents for a same day appointment for the following:  ***  ***Patient here today for lumbar pain.  Has MRI of lumbar spine showing small central disc protrusion L5-S1 without stenosis or neural impingement, moderate bilateral foraminal narrowing L5-S1 right worse than left, degenerative disc bulge T11-12 without stenosis, mild bilateral facet arthrosis of L4-L5 and L5-S1.  ROS: see HPI for pertinent.  PMFSH: HTN, morbid obesity, OA knees b/l, sciatica neuralgia, spinal stenosis of lumbar w/ radiculopathy. Surgical history wisdom tooth extraction. Family history HTN. Smoking status reviewed. Medications reviewed.  Objective   There were no vitals taken for this visit. Vitals and nursing note reviewed.  General: well nourished, well developed, NAD with non-toxic appearance HEENT: normocephalic, atraumatic, moist mucous membranes Neck: supple, non-tender without lymphadenopathy Cardiovascular: regular rate and rhythm without murmurs, rubs, or gallops Lungs: clear to auscultation bilaterally with normal work of breathing Abdomen: soft, non-tender, non-distended, normoactive bowel sounds Skin: warm, dry, no rashes or lesions, cap refill < 2 seconds Extremities: warm and well perfused, normal tone, no edema  Assessment & Plan   No problem-specific Assessment & Plan notes found for this encounter.  No orders of the defined types were placed in this encounter.  No orders of the defined types were placed in this encounter.   Durward Parcelavid Briaunna Grindstaff, DO [facility], PGY-3 01/07/2018, 1:02 PM

## 2018-01-08 ENCOUNTER — Encounter: Payer: Self-pay | Admitting: Family Medicine

## 2018-01-08 ENCOUNTER — Ambulatory Visit (INDEPENDENT_AMBULATORY_CARE_PROVIDER_SITE_OTHER): Payer: Medicaid Other | Admitting: Family Medicine

## 2018-01-08 VITALS — BP 180/82 | HR 72 | Temp 98.0°F | Ht 66.0 in | Wt 314.0 lb

## 2018-01-08 DIAGNOSIS — M5416 Radiculopathy, lumbar region: Secondary | ICD-10-CM

## 2018-01-08 DIAGNOSIS — M48061 Spinal stenosis, lumbar region without neurogenic claudication: Secondary | ICD-10-CM

## 2018-01-08 DIAGNOSIS — F411 Generalized anxiety disorder: Secondary | ICD-10-CM

## 2018-01-08 DIAGNOSIS — I10 Essential (primary) hypertension: Secondary | ICD-10-CM

## 2018-01-08 MED ORDER — CLONAZEPAM 0.5 MG PO TABS: ORAL_TABLET | ORAL | 0 refills | Status: DC

## 2018-01-08 MED ORDER — AMLODIPINE BESYLATE 10 MG PO TABS: 10.0000 mg | ORAL_TABLET | Freq: Every day | ORAL | 3 refills | Status: DC

## 2018-01-08 NOTE — Progress Notes (Signed)
Subjective   Patient ID: Shirley [name]    DOB: 04-08-1991, 27 y.o. female   MRN: [id number]  CC: "Chronic low back pain"  HPI: Shirley [name] is a 27 y.o. female who presents for a same day appointment for the following:  BACK PAIN  Onset: Several years ago Pain characteristic: throbbing Patient has tried: Naproxen without relief Pain radiates: Right leg from thigh to ankle History of trauma or injury: no Prior history of similar pain: Yes, endorses chronic low back pain since age 47 History of cancer: no Weak immune system: no History of IV drug use: no History of steroid use: no  Symptoms Incontinence of bowel or bladder: Patient reports urgency and now incontinence for the past year which has not worsened over the last several months Saddle anesthesia: no Radiculopathy: yes, right leg Fever: no Rest or nocturnal pain: No Weight Loss: No Rash: No  ROS: see HPI for pertinent.  PMFSH: HTN, morbid obesity, OA knees b/l, sciatica neuralgia, spinal stenosis of lumbar w/ radiculopathy. Surgical history wisdom tooth extraction. Family history HTN. Smoking status reviewed. Medications reviewed.  Objective   BP (!) 180/82 (BP Location: Left Arm, Patient Position: Sitting, Cuff Size: Large)   Pulse 72   Temp 98 F (36.7 C) (Oral)   Ht 5\' 6"  (1.676 m)   Wt (!) 314 lb (142.4 kg)   SpO2 98%   BMI 50.68 kg/m  Vitals and nursing note reviewed.  General: well nourished, well developed, NAD with non-toxic appearance HEENT: normocephalic, atraumatic, moist mucous membranes Neck: supple, non-tender Cardiovascular: regular rate and rhythm without murmurs, rubs, or gallops Lungs: clear to auscultation bilaterally with normal work of breathing Abdomen: obese, soft, non-tender, non-distended, normoactive bowel sounds Rectal: appropriate anal sphincter tone, no gross blood appreciated Skin: warm, dry, no rashes or lesions, cap refill < 2 seconds Extremities: warm and well  perfused, normal tone, no edema MSK: 5/5 motor strength in all 4 extremities, ROM limited on right leg particularly with flexion at the hip noted to tenderness, moderate tenderness to right lumbar region Neuro: DTR 2+ bilaterally on lower extremities, sensation intact on legs bilaterally  Assessment & Plan   Primary hypertension Chronic.  Uncontrolled due to patient nonadherence with amlodipine.  Non-smoker. 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  Localized to lumbar region with right greater than left and radicular symptoms.  No signs of cauda equina with intact sphincter tone and normal reflexes and strength of lower extremities.  Patient does warrant MRI evaluation.  She has been resistant to physical therapy and multiple medications including NSAIDs, tramadol, gabapentin, Cymbalta.  Based on previous MRI, there seems to be moderate severe arthritic changes and possibly nerve impingement based on history. - MRI lumbar spine without contrast and given 2 tablets of Klonopin to take one tab 1 hour prior to procedure for anxiety - Ambulatory referral to neurosurgery - Reviewed return precautions - Advised patient to use Tylenol and NSAIDs synergistically as needed for back pain though more advanced procedures such as spinal injections would be much more beneficial based on how advanced her back pain is   Orders Placed This Encounter  Procedures  . MR Lumbar Spine Wo Contrast    Epic order Wt 315/ ht 5'6/ claus/ no metal in eyes/no lumbar sx/no implants/no needs Ins- mcd  Pda/pt     Standing Status:   Future  Standing Expiration Date:   03/12/2019    Order Specific Question:   What is the patient's sedation requirement?    Answer:   No Sedation    Order Specific Question:   Does the patient have a pacemaker or implanted devices?    Answer:   No    Order Specific Question:    Preferred imaging location?    Answer:   [facility] (table limit-550lbs)    Order Specific Question:   Radiology Contrast Protocol - do NOT remove file path    Answer:   [email or web address]  . Ambulatory referral to Neurosurgery    Referral Priority:   Routine    Referral Type:   Surgical    Referral Reason:   Specialty Services Required    Requested Specialty:   Neurosurgery    Number of Visits Requested:   1   Meds ordered this encounter  Medications  . amLODipine (NORVASC) 10 MG tablet    Sig: Take 1 tablet (10 mg total) by mouth daily.    Dispense:  90 tablet    Refill:  3  . clonazePAM (KLONOPIN) 0.5 MG tablet    Sig: TAKE 1 TABLET 1 HOUR PRIOR TO MRI.    Dispense:  2 tablet    Refill:  0    Durward Parcelavid McMullen, DO [facility], PGY-3 01/09/2018, 8:41 AM

## 2018-01-08 NOTE — Patient Instructions (Signed)
Thank you for coming in to see us today. Please see below to review our plan for today's visit.  I have placed a referral for you to see a neurosurgeon to evaluate your low back pain.  Medication will likely not improve with this pain.  In the interim you can try Tylenol 650 mg every 6 hours and ibuprofen 800 mg every 8 hours as needed.  Take these medications together.  Make sure that you keep walking daily and avoid being sedentary as this can worsen your back pain.  I will call you regarding your MRI results.  Please call the clinic at [phone] if your symptoms worsen or you have any concerns. It was our pleasure to serve you.  Durward Parcelavid Cherith Tewell, DO [facility], PGY-3

## 2018-01-09 ENCOUNTER — Other Ambulatory Visit: Payer: Self-pay | Admitting: Family Medicine

## 2018-01-09 NOTE — Assessment & Plan Note (Signed)
Chronic.  Uncontrolled due to patient nonadherence with amlodipine.  Non-smoker. - Emphasized the importance of BP control and given refill for amlodipine 10 mg daily

## 2018-01-09 NOTE — Assessment & Plan Note (Signed)
>>  ASSESSMENT AND PLAN FOR PRIMARY HYPERTENSION WRITTEN ON 01/09/2018  8:37 AM BY MCMULLEN, DAVID J, DO  Chronic.  Uncontrolled due to patient nonadherence with amlodipine .  Non-smoker. - Emphasized the importance of BP control and given refill for amlodipine  10 mg daily

## 2018-01-09 NOTE — Assessment & Plan Note (Addendum)
Chronic.  Localized to lumbar region with right greater than left and radicular symptoms.  No signs of cauda equina with intact sphincter tone and normal reflexes and strength of lower extremities.  Patient does warrant MRI evaluation.  She has been resistant to physical therapy and multiple medications including NSAIDs, tramadol, gabapentin, Cymbalta, Flexeril.  Based on previous MRI, there seems to be moderate severe arthritic changes and possibly nerve impingement based on history. - MRI lumbar spine without contrast and given 2 tablets of Klonopin to take one tab 1 hour prior to procedure for anxiety - Ambulatory referral to neurosurgery - Reviewed return precautions - Advised patient to use Tylenol and NSAIDs synergistically as needed for back pain though more advanced procedures such as spinal injections would be much more beneficial based on how advanced her back pain is

## 2018-01-15 ENCOUNTER — Inpatient Hospital Stay: Admission: RE | Admit: 2018-01-15 | Payer: Self-pay | Source: Ambulatory Visit

## 2018-01-28 DIAGNOSIS — M25551 Pain in right hip: Secondary | ICD-10-CM | POA: Diagnosis not present

## 2018-01-28 DIAGNOSIS — G8929 Other chronic pain: Secondary | ICD-10-CM | POA: Diagnosis not present

## 2018-01-28 DIAGNOSIS — M545 Low back pain, unspecified: Secondary | ICD-10-CM

## 2018-01-28 DIAGNOSIS — I10 Essential (primary) hypertension: Secondary | ICD-10-CM | POA: Diagnosis not present

## 2018-01-28 DIAGNOSIS — M5441 Lumbago with sciatica, right side: Secondary | ICD-10-CM | POA: Diagnosis not present

## 2018-01-28 HISTORY — DX: Other chronic pain: G89.29

## 2018-01-28 HISTORY — DX: Low back pain, unspecified: M54.50

## 2018-02-02 ENCOUNTER — Telehealth: Payer: Self-pay | Admitting: Family Medicine

## 2018-02-02 NOTE — Telephone Encounter (Signed)
Pt would like her x-ray results

## 2018-02-03 ENCOUNTER — Other Ambulatory Visit: Payer: Self-pay | Admitting: Physician Assistant

## 2018-02-03 DIAGNOSIS — M25551 Pain in right hip: Secondary | ICD-10-CM

## 2018-02-03 DIAGNOSIS — M5441 Lumbago with sciatica, right side: Principal | ICD-10-CM

## 2018-02-03 DIAGNOSIS — G8929 Other chronic pain: Secondary | ICD-10-CM

## 2018-02-03 NOTE — Telephone Encounter (Signed)
Upon review of chart.  Shirley Costellos, PA orders and scehduled an MRI today.  She is scheduled for 02/13/18 and Shirley CopasVincent [name] is [name]. Fleeger, Maryjo RochesterJessica Dawn, CMA

## 2018-02-03 NOTE — Telephone Encounter (Signed)
Patient was seen by me on 01/08/2018 for acute on chronic low back pain with radiculopathy.  We did agree to pursue an MRI.  The order is still active without results.  She was referred to neurosurgery so I am unsure if there was imaging that they performed that I am not aware of.  Please advise.  Durward Parcelavid McMullen, DO [facility], PGY-3

## 2018-02-03 NOTE — Telephone Encounter (Signed)
Thanks.  David McMullen, DO [facility], PGY-3  

## 2018-02-13 ENCOUNTER — Inpatient Hospital Stay
Admission: RE | Admit: 2018-02-13 | Discharge: 2018-02-13 | Disposition: A | Discharge status: Home / Self Care | Payer: Self-pay | Source: Ambulatory Visit | Attending: Physician Assistant | Admitting: Physician Assistant

## 2018-02-13 ENCOUNTER — Ambulatory Visit
Admission: RE | Admit: 2018-02-13 | Discharge: 2018-02-13 | Disposition: A | Discharge status: Home / Self Care | Payer: Medicaid Other | Source: Ambulatory Visit | Attending: Physician Assistant | Admitting: Physician Assistant

## 2018-02-13 DIAGNOSIS — M25551 Pain in right hip: Secondary | ICD-10-CM

## 2018-02-13 DIAGNOSIS — M5441 Lumbago with sciatica, right side: Principal | ICD-10-CM

## 2018-02-13 DIAGNOSIS — G8929 Other chronic pain: Secondary | ICD-10-CM

## 2018-02-16 DIAGNOSIS — M259 Joint disorder, unspecified: Secondary | ICD-10-CM | POA: Insufficient documentation

## 2018-02-16 DIAGNOSIS — Z6841 Body Mass Index (BMI) 40.0 and over, adult: Secondary | ICD-10-CM | POA: Diagnosis not present

## 2018-02-16 DIAGNOSIS — Q6589 Other specified congenital deformities of hip: Secondary | ICD-10-CM | POA: Diagnosis not present

## 2018-02-16 DIAGNOSIS — I10 Essential (primary) hypertension: Secondary | ICD-10-CM | POA: Diagnosis not present

## 2018-02-16 DIAGNOSIS — M5441 Lumbago with sciatica, right side: Secondary | ICD-10-CM | POA: Diagnosis not present

## 2018-02-16 HISTORY — DX: Joint disorder, unspecified: M25.9

## 2018-02-18 ENCOUNTER — Other Ambulatory Visit: Payer: Self-pay | Admitting: Physician Assistant

## 2018-02-18 ENCOUNTER — Telehealth: Payer: Self-pay

## 2018-02-18 DIAGNOSIS — M5441 Lumbago with sciatica, right side: Secondary | ICD-10-CM

## 2018-02-18 NOTE — Telephone Encounter (Signed)
Noted.  David McMullen, DO [facility], PGY-3  

## 2018-02-18 NOTE — Telephone Encounter (Signed)
Patient returned call. Stated she has been seen [facility]. They recommend some sort of injection for her but states we need to get it arranged/authorized. Stated that the doctor there was going to contact PCP.  Call back is [phone].  Ples SpecterAlisa Brake, RN [facility] RN)

## 2018-02-18 NOTE — Telephone Encounter (Signed)
Patient left message on nurse line that she needs a referral. Called patient back but got voicemail. Left message to return call.  Ples SpecterAlisa Alexzandria Massman, RN [facility])

## 2018-03-03 ENCOUNTER — Encounter: Payer: Self-pay | Admitting: Radiology

## 2018-03-03 ENCOUNTER — Ambulatory Visit
Admission: RE | Admit: 2018-03-03 | Discharge: 2018-03-03 | Disposition: A | Discharge status: Home / Self Care | Payer: Medicaid Other | Source: Ambulatory Visit | Attending: Physician Assistant | Admitting: Physician Assistant

## 2018-03-03 DIAGNOSIS — M5441 Lumbago with sciatica, right side: Secondary | ICD-10-CM

## 2018-03-03 MED ORDER — METHYLPREDNISOLONE ACETATE 40 MG/ML INJ SUSP (RADIOLOG
120.0000 mg | Freq: Once | INTRAMUSCULAR | Status: AC
  Administered 2018-03-03: 120 mg via EPIDURAL

## 2018-03-03 MED ORDER — IOPAMIDOL (ISOVUE-M 200) INJECTION 41%
1.0000 mL | Freq: Once | INTRAMUSCULAR | Status: AC
  Administered 2018-03-03: 1 mL via EPIDURAL

## 2018-03-03 NOTE — Discharge Instructions (Signed)

## 2018-03-12 ENCOUNTER — Other Ambulatory Visit: Payer: Self-pay | Admitting: Family Medicine

## 2018-03-12 ENCOUNTER — Telehealth: Payer: Self-pay

## 2018-03-12 DIAGNOSIS — M21851 Other specified acquired deformities of right thigh: Secondary | ICD-10-CM

## 2018-03-12 NOTE — Telephone Encounter (Signed)
Pt left message on nurse line she wanted to speak with Dr. Abelardo DieselMcMullen. Attempted to return patients call- no answer. Left VM for pt to return call Pt's call back [phone] Shawna OrleansMeredith B Lexi Conaty, RN

## 2018-03-12 NOTE — Telephone Encounter (Signed)
Pt is returning Dr. Tamala FothergillMcMullen's phone call.

## 2018-03-12 NOTE — Telephone Encounter (Signed)
Pt states she had injection for her back and was to call back to let PCP know how she was doing. Pt states it did not help her at all and wants to know what she can do from here. Pt call back [phone] Shawna OrleansMeredith B Thomsen, RN

## 2018-03-12 NOTE — Telephone Encounter (Signed)
Contact patient, no answer.  Left voicemail.  Placed orthopedic referral concerning labral tear of right hip which is likely contributing to her chronic pain.  Patient would benefit from weight loss.   Durward Parcelavid McMullen, DO [facility], PGY-3

## 2018-03-16 NOTE — Telephone Encounter (Signed)
Made 2nd attempt to call. No answer. Please advise.  Durward Parcelavid Jancarlo Biermann, DO [facility], PGY-3

## 2018-03-17 NOTE — Telephone Encounter (Signed)
Patient has an upcoming appt on 04/01/18 with pcp.  Jazmin Hartsell,CMA

## 2018-03-19 ENCOUNTER — Telehealth: Payer: Self-pay | Admitting: Family Medicine

## 2018-03-19 NOTE — Telephone Encounter (Signed)
She was returning a call from us.  Her phone is not working properly so please try her again at her mom's phone [phone].

## 2018-03-20 ENCOUNTER — Ambulatory Visit (INDEPENDENT_AMBULATORY_CARE_PROVIDER_SITE_OTHER): Payer: Self-pay | Admitting: Family Medicine

## 2018-03-23 ENCOUNTER — Encounter (INDEPENDENT_AMBULATORY_CARE_PROVIDER_SITE_OTHER): Payer: Self-pay | Admitting: Family Medicine

## 2018-03-23 ENCOUNTER — Ambulatory Visit (INDEPENDENT_AMBULATORY_CARE_PROVIDER_SITE_OTHER): Payer: Self-pay | Admitting: Family Medicine

## 2018-03-23 DIAGNOSIS — Q6589 Other specified congenital deformities of hip: Secondary | ICD-10-CM

## 2018-03-23 NOTE — Patient Instructions (Signed)
   1.  Try to cut out breads; pastas; cereals; sugars/sweets (including soft drinks).  2.  Try glucosamine sulfate 1,000 mg twice daily.  3.  Try turmeric 500 mg twice daily.

## 2018-03-23 NOTE — Telephone Encounter (Signed)
Called patient. Following up with ortho today. Has appt on 04/01/18 with me.  Durward Parcelavid McMullen, DO [facility], PGY-3

## 2018-03-23 NOTE — Progress Notes (Signed)
Office Visit Note   Patient: Shirley [name]           Date of Birth: 1990/11/17           MRN: [id number] Visit Date: 03/23/2018 Requested by: Wendee Beavers, DO [address] [place], [place] [address] PCP: Wendee Beavers, DO  Subjective: Chief Complaint  Patient presents with  . Right Hip - Pain    HPI: She is a 27 year old with right hip pain.  Symptoms started about 16 years ago per patient report.  She does not recall a specific injury but she started noticing intermittent pain around that time.  Over the past few years her pain has become more constant.  She has had knee and back pain as well.  Her hip hurts anterolaterally and clicks when she walks.  Couple weeks ago she had a lumbar epidural injection which did not give her any relief.  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  She now presents for evaluation.  She is morbidly obese.  She currently weighs 309 pounds.  She eats unhealthfully, drinks a lot of soft drinks.  She has not been to a nutritionist.               ROS: Other systems were negative other than hypertension.  Objective: Vital Signs: LMP 03/01/2018 (Exact Date)   Physical Exam:  Hip: Right hip has good range of motion but pain with internal rotation.  There is some tenderness over the greater trochanter.  Lower extremity strength and reflexes are normal.  Imaging: No new images taken today.  Assessment & Plan: 1.  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  We discussed the importance of making some dietary changes.  She might also be a candidate for bariatric surgery and I gave her some information about that.  Could contemplate intra-articular injection but it will not cure her problem.  It would only be  for short-term symptom relief.  I do not think surgery would benefit her right now. -Trial of glucosamine, turmeric over-the-counter.   Follow-Up Instructions: No follow-ups on file.       Procedures: None today.   PMFS History: Patient Active Problem List   Diagnosis Date Noted  . Osteoarthritis of both knees 09/29/2015  . 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  . Low back pain with right-sided sciatica 09/15/2015  . Obesity, Class III, BMI 40-49.9 (morbid obesity) (HCC) 12/02/2006  . Primary hypertension 08/28/2006   Past Medical History:  Diagnosis Date  . Arthritis   . Hypertension   . Obesity   . Sciatica     Family History  Problem Relation Age of Onset  . Hypertension Mother   . Diabetes Maternal Grandmother   . Diabetes Paternal Grandmother   . Stroke Paternal Grandmother     Past Surgical History:  Procedure Laterality Date  . TOOTH EXTRACTION N/A 10/22/2013   Procedure: DENTAL EXTRACTIONS TEETH #1, 16, 17, 32;  Surgeon: Georgia Lopes, DDS;  Location: [facility] OR;  Service: Oral Surgery;  Laterality: N/A;   Social History   Occupational History  . Not on file  Tobacco Use  . Smoking status: Former Smoker    Packs/day: 0.50    Types: Cigarettes  Last attempt to quit: 01/29/2017    Years since quitting: 1.1  . Smokeless tobacco: Never Used  Substance and Sexual Activity  . Alcohol use: No    Alcohol/week: 0.0 standard drinks    Frequency: Never    Comment: rare  . Drug use: No    Types: Marijuana    Comment: daily usage  . Sexual activity: Yes    Birth control/protection: None

## 2018-04-01 ENCOUNTER — Ambulatory Visit: Payer: Self-pay | Admitting: Family Medicine

## 2018-04-01 NOTE — Progress Notes (Deleted)
   Subjective   Patient ID: Shirley [name]    DOB: 02-Feb-1991, 27 y.o. female   MRN: [id number]  CC: "***"  HPI: Shirley [name] is a 27 y.o. female who presents to clinic today for the following:  ***: ***  ***Last seen by me in July for chronic low back pain.  Defer to neurosurgery based on nerve involvement.  Advised to use NSAIDs and consider spinal injections.  Patient was referred to [facility] on 01/28/2018.  Patient also evaluated by orthopedic surgery on 03/23/2018 for chronic right hip pain with congenital dysplasia and acetabular labral tear complicated by morbid obesity.  Patient advised to lose weight and consideration of bariatric surgery.  They did not feel that surgery was beneficial.  ROS: see HPI for pertinent.  PMFSH: HTN, morbid obesity, OA knees b/l, sciatica neuralgia, spinal stenosis of lumbar w/ radiculopathy. Surgical history wisdom tooth extraction. Family history HTN. Smoking status reviewed. Medications reviewed.  Objective   There were no vitals taken for this visit. Vitals and nursing note reviewed.  General: well nourished, well developed, NAD with non-toxic appearance HEENT: normocephalic, atraumatic, moist mucous membranes Neck: supple, non-tender without lymphadenopathy Cardiovascular: regular rate and rhythm without murmurs, rubs, or gallops Lungs: clear to auscultation bilaterally with normal work of breathing Abdomen: soft, non-tender, non-distended, normoactive bowel sounds Skin: warm, dry, no rashes or lesions, cap refill < 2 seconds Extremities: warm and well perfused, normal tone, no edema  Assessment & Plan   No problem-specific Assessment & Plan notes found for this encounter.  No orders of the defined types were placed in this encounter.  No orders of the defined types were placed in this encounter.   Durward Parcel, DO [facility], PGY-3 04/01/2018, 8:33 AM

## 2018-04-14 ENCOUNTER — Ambulatory Visit (INDEPENDENT_AMBULATORY_CARE_PROVIDER_SITE_OTHER): Payer: Medicaid Other | Admitting: Family Medicine

## 2018-04-14 ENCOUNTER — Other Ambulatory Visit: Payer: Self-pay

## 2018-04-14 ENCOUNTER — Other Ambulatory Visit (HOSPITAL_COMMUNITY)
Admission: RE | Admit: 2018-04-14 | Discharge: 2018-04-14 | Disposition: A | Discharge status: Home / Self Care | Payer: Medicaid Other | Source: Ambulatory Visit | Attending: Family Medicine | Admitting: Family Medicine

## 2018-04-14 ENCOUNTER — Encounter: Payer: Self-pay | Admitting: Family Medicine

## 2018-04-14 VITALS — BP 150/118 | HR 57 | Temp 98.0°F | Ht 65.0 in | Wt 305.0 lb

## 2018-04-14 DIAGNOSIS — Z113 Encounter for screening for infections with a predominantly sexual mode of transmission: Secondary | ICD-10-CM | POA: Insufficient documentation

## 2018-04-14 DIAGNOSIS — M545 Low back pain, unspecified: Secondary | ICD-10-CM

## 2018-04-14 DIAGNOSIS — Z3202 Encounter for pregnancy test, result negative: Secondary | ICD-10-CM | POA: Diagnosis not present

## 2018-04-14 DIAGNOSIS — Z3009 Encounter for other general counseling and advice on contraception: Secondary | ICD-10-CM | POA: Diagnosis not present

## 2018-04-14 DIAGNOSIS — G8929 Other chronic pain: Secondary | ICD-10-CM | POA: Diagnosis not present

## 2018-04-14 LAB — POCT URINE PREGNANCY: Preg Test, Ur: NEGATIVE

## 2018-04-14 MED ORDER — AMITRIPTYLINE HCL 10 MG PO TABS: 10.0000 mg | ORAL_TABLET | Freq: Every day | ORAL | 0 refills | Status: DC

## 2018-04-14 NOTE — Progress Notes (Signed)
Patient Name: Shirley [name] Date of Birth: [birth year] Date of Visit: 04/14/18 PCP: Wendee Beavers, DO  Chief Complaint: Weight gain, contraception  Subjective: Shirley [name] is a pleasant 27 y.o. year old woman with a history of hypertension, morbid obesity, low back pain and right hip dysplasia presenting today for routine follow-up and a number of concerns.  Contraception The patient has a history of 3 prior vaginal deliveries.  She is very interested in a Mirena.  She last had unprotected sex 3 days ago.  She is not interested in having children in the future at this time.  Weight loss The patient has a long-standing history of morbid obesity.  She reports she has been "big" as long as she can remember.  She previously was turned lose weight before her last pregnancy.  She had lost about 30 pounds and then became pregnant.  She typically wakes up in the morning and eats once or twice per day.  She drinks sodas and juices throughout the day.  Her diet is variable and largely consists of what her children will eat.  She has difficulty exercising due to her chronic back pain and hip pain.  Back pain Patient has a many year history of back pain.  She was recently diagnosed with acetabular dysplasia of the right hip.  She reports her main problem is not hip pain but rather low back pain which radiates down her right leg.  The pain is around 5 or 6 out of 10 most days.  She denies bowel or bladder incontinence, numbness or weakness.  She does have difficulty exercising due to this.  She has tried Tylenol NSAIDs, gabapentin and a number of other medications for her pain.  Hypertension He did not take her medication today.  She intermittently takes her medication.  She reports she frequently forgets this medication.  Denies headaches, vision changes, chest pain, difficulty breathing.  ROS:  ROS Reviewed as above   I have reviewed the patient's medical, surgical, family, and social  history as appropriate.   Vitals:   04/14/18 1612  BP: (!) 150/118  Pulse: (!) 57  Temp: 98 F (36.7 C)  SpO2: 98%   Filed Weights   04/14/18 1612  Weight: (!) 305 lb (138.3 kg)  HEENT: Sclera anicteric. Dentition is moderate. Appears well hydrated. Neck: Supple Cardiac: Regular rate and rhythm. Normal S1/S2. No murmurs, rubs, or gallops appreciated. Lungs: Clear bilaterally to ascultation.     Marshea was seen today for back pain.  Diagnoses and all orders for this visit:  Screen for STD (sexually transmitted disease) -     Urine cytology ancillary only  Encounter for contraception counseling.  Spent greater than 10 minutes discussing options including Depo-Provera progesterone only options, Nexplanon or Mirena.  The patient is very interested in [place].  We discussed the use of Plan B as she recently had unprotected sex she is not interested in using this at this time.  She will need a repeat pregnancy test at time of her Mirena placement.  She is up-to-date on the Pap smear.  She will use condoms until placement of her Mirena -     POCT urine pregnancy  Chronic bilateral low back pain without sciatica reviewed exercises for patient.  Her insurance previously only paid for 1 session.  Recommended Tylenol 3 times a day.  Recommended that narcotic therapy is not effective for chronic back pain nor is this is safe medication.  Will trial small dose of  TCA.  -     amitriptyline (ELAVIL) 10 MG tablet; Take 1 tablet (10 mg total) by mouth at bedtime.  Morbid obesity been greater than 10 minutes counseling patient about healthy lifestyle and dietary choices.  We agreed to reduce soda and sugar sweet beverages such as sweet tea and juice intake.  Could consider addition of weight loss medication such as lorcaserin in future.  Could also consider something such as bupropion and naltrexone.  The patient currently does not have insurance.  She is also interested in bariatric surgery.  She is  morbidly obese and already has comorbidities and would likely benefit from either a gastric sleeve procedure or a Roux-en-Y bypass. - Food diary - Reduce soda and juice intake   HTN Encouraged patient to take medication, explored barriers.  Insurance, patient will call about Medicaid.  Terisa Starr, MD  Family Medicine Teaching Service

## 2018-04-14 NOTE — Patient Instructions (Signed)
   Stop soda and juice- water is best   Keep a food diary  Lorcaserin is a medication I sometimes prescribe for weight loss  Mirena- is the IUD (intrauterine device) this is a very, very good from of contraception  I will see you on November 1st--condoms every single time until then!   It was wonderful to see you today.  Thank you for choosing [facility].   Please call [phone] with any questions about today's appointment.  Please be sure to schedule follow up at the front  desk before you leave today.   Terisa Starr, MD  Family Medicine

## 2018-04-16 LAB — URINE CYTOLOGY ANCILLARY ONLY
Chlamydia: NEGATIVE
Neisseria Gonorrhea: NEGATIVE

## 2018-04-17 ENCOUNTER — Telehealth: Payer: Self-pay | Admitting: Family Medicine

## 2018-04-17 NOTE — Telephone Encounter (Signed)
Pt informed. Deseree Blount, CMA  

## 2018-04-17 NOTE — Telephone Encounter (Signed)
Please call patient and let her know her testing for infection is negative. Please remind her of her upcoming appointment for a Mirena.

## 2018-05-01 ENCOUNTER — Ambulatory Visit: Payer: Self-pay | Admitting: Family Medicine

## 2018-07-09 ENCOUNTER — Ambulatory Visit (INDEPENDENT_AMBULATORY_CARE_PROVIDER_SITE_OTHER): Payer: Medicaid Other | Admitting: Family Medicine

## 2018-07-09 NOTE — Patient Instructions (Signed)
Thank you for coming in to see [place] today. Please see below to review our plan for today's visit.  Your chronic back pain and hip pain is exacerbated by your obesity.  Weight loss is the most important thing at this point.  He will likely always have pain, however we can work together to help mitigate this.  I placed a referral for you to see the bariatric clinic.  They will get in touch with you to schedule the appointment at their clinic.  You can discuss with them your options including surgery.  It is important for you to understand that this will be a slow process, but we will work together.  Portion control is important along with caloric intake restriction.  Carbohydrates and excess are your enemy.  These include breads, pastas, rice, potatoes, and processed sugars including sweetened beverages.  Cutting back on these will significantly help you in the longterm.  Please call the clinic at [phone] if your symptoms worsen or you have any concerns. It was our pleasure to serve you.  Durward Parcel, DO [facility], PGY-3

## 2018-07-09 NOTE — Progress Notes (Signed)
   Subjective   Patient ID: Shirley [name]    DOB: 05/28/1991, 28 y.o. female   MRN: [id number]  CC: "Pain"  HPI: Shirley [name] is a 28 y.o. female who presents to clinic today for the following:  Chronic low back pain with bilateral sciatica: Patient presented today for follow-up regarding chronic lumbar back pain with bilateral radiculopathy, right greater than left.  MRI in August revealed minimal change compared to prior in 2017 with small central disc protrusion at L5-S1 without stenosis or neural impingement, mild bilateral L4 with moderate bilateral L5 foraminal stenosis, and disc bulge with facet hypertrophy at T11-12 with mild canal and bilateral foraminal stenosis.  Patient followed up with orthopedics.  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  She also has notable morbid obesity with a BMI of 50 weighing 304 pounds today.  She has undergone epidural injections without relief.  Recommendations were made for patient to lose a significant amount of weight and may benefit from bariatric surgery.  Patient has considered applying for disability.  She would like to be referred to bariatric clinic.  She finds exercising difficult and has been making efforts to avoid sodas and bread in her diet since talking to Dr. Manson Passey during her last visit.  ROS: see HPI for pertinent.  PMFSH: HTN, morbid obesity, OA knees b/l, sciatica neuralgia, spinal stenosis of lumbar w/ radiculopathy. Surgical history wisdom tooth extraction. Family history HTN. Smoking status reviewed. Medications reviewed.  Objective   BP 135/80   Pulse 75   Temp (!) 97.5 F (36.4 C) (Oral)   Wt (!) 304 lb 6.4 oz (138.1 kg)   SpO2 98%   BMI 50.65 kg/m  Vitals and nursing note reviewed.  General: well nourished, well developed, NAD with non-toxic appearance HEENT: normocephalic, atraumatic, moist mucous membranes Cardiovascular: regular rate and rhythm without  murmurs, rubs, or gallops Lungs: clear to auscultation bilaterally with normal work of breathing Abdomen: obese, soft, non-tender, non-distended, normoactive bowel sounds Skin: warm, dry, no rashes or lesions, cap refill < 2 seconds Extremities: warm and well perfused, normal tone, no edema MSK: slow but steady gait, 5/5 motor strength in all 4 extremities, tenderness to right SI joint, sensation intact on lower extremities bilaterally  Assessment & Plan   Severe obesity (BMI >= 40) (HCC) Chronic.  Comorbidities include hypertension, chronic low back pain with sciatica.  She does have known mild spinal stenosis and right hip dysplasia based on MRI.  Has failed epidural injection.  Patient interested in bariatric surgery. - Ambulatory referral to bariatric clinic - Discussed lifestyle modifications including importance of exercise and caloric restriction - Reviewed return precautions - Encourage patient to focus on bariatric clinic at this time and weight on applying for disability as this will not fix her problem, patient in agreement  Orders Placed This Encounter  Procedures  . Amb Referral to Bariatric Surgery    Referral Priority:   Routine    Referral Type:   Consultation    Number of Visits Requested:   1   No orders of the defined types were placed in this encounter.   Durward Parcel, DO [facility], PGY-3 07/10/2018, 9:50 AM

## 2018-07-10 ENCOUNTER — Encounter: Payer: Self-pay | Admitting: Family Medicine

## 2018-07-10 NOTE — Assessment & Plan Note (Addendum)
Chronic.  Comorbidities include hypertension, chronic low back pain with sciatica.  She does have known mild spinal stenosis and right hip dysplasia based on MRI.  Has failed epidural injection.  Patient interested in bariatric surgery. - Ambulatory referral to bariatric clinic - Discussed lifestyle modifications including importance of exercise and caloric restriction - Reviewed return precautions - Encourage patient to focus on bariatric clinic at this time and weight on applying for disability as this will not fix her problem, patient in agreement

## 2018-07-16 ENCOUNTER — Telehealth: Payer: Self-pay | Admitting: *Deleted

## 2018-07-16 NOTE — Telephone Encounter (Signed)
Pt states that her and the doctor discussed a letter saying that she cant work.  Will forward to PCP to advise. [place], Maryjo Rochester, CMA

## 2018-07-16 NOTE — Telephone Encounter (Signed)
Yes we discussed this and I requested she follow with bariatric services. If she would like a note stating she be excused from work, I can do that. Just need to know what she cannot specifically do. Please advise.  Durward Parcel, DO [facility], PGY-3

## 2018-07-19 ENCOUNTER — Emergency Department (HOSPITAL_COMMUNITY): Payer: Medicaid Other

## 2018-07-19 ENCOUNTER — Encounter (HOSPITAL_COMMUNITY): Payer: Self-pay

## 2018-07-19 ENCOUNTER — Emergency Department (HOSPITAL_COMMUNITY)
Admission: EM | Admit: 2018-07-19 | Discharge: 2018-07-19 | Disposition: A | Discharge status: Home / Self Care | Payer: Medicaid Other | Attending: Emergency Medicine | Admitting: Emergency Medicine

## 2018-07-19 DIAGNOSIS — M549 Dorsalgia, unspecified: Secondary | ICD-10-CM

## 2018-07-19 DIAGNOSIS — J028 Acute pharyngitis due to other specified organisms: Secondary | ICD-10-CM | POA: Diagnosis not present

## 2018-07-19 DIAGNOSIS — K143 Hypertrophy of tongue papillae: Secondary | ICD-10-CM | POA: Insufficient documentation

## 2018-07-19 DIAGNOSIS — J029 Acute pharyngitis, unspecified: Secondary | ICD-10-CM | POA: Diagnosis not present

## 2018-07-19 DIAGNOSIS — R05 Cough: Secondary | ICD-10-CM | POA: Diagnosis present

## 2018-07-19 DIAGNOSIS — G8929 Other chronic pain: Secondary | ICD-10-CM | POA: Diagnosis not present

## 2018-07-19 DIAGNOSIS — M545 Low back pain: Secondary | ICD-10-CM | POA: Diagnosis not present

## 2018-07-19 DIAGNOSIS — M479 Spondylosis, unspecified: Secondary | ICD-10-CM | POA: Diagnosis not present

## 2018-07-19 DIAGNOSIS — M47819 Spondylosis without myelopathy or radiculopathy, site unspecified: Secondary | ICD-10-CM | POA: Diagnosis not present

## 2018-07-19 LAB — PREGNANCY, URINE: PREG TEST UR: NEGATIVE

## 2018-07-19 MED ORDER — MAGIC MOUTHWASH
5.0000 mL | Freq: Once | ORAL | Status: AC
  Administered 2018-07-19: 5 mL via ORAL
  Filled 2018-07-19: qty 5

## 2018-07-19 MED ORDER — NAPROXEN 500 MG PO TABS: 500.0000 mg | ORAL_TABLET | Freq: Two times a day (BID) | ORAL | 0 refills | Status: DC

## 2018-07-19 MED ORDER — NAPROXEN 250 MG PO TABS
500.0000 mg | ORAL_TABLET | Freq: Once | ORAL | Status: AC
  Administered 2018-07-19: 500 mg via ORAL
  Filled 2018-07-19: qty 2

## 2018-07-19 MED ORDER — METHOCARBAMOL 500 MG PO TABS: 500.0000 mg | ORAL_TABLET | Freq: Two times a day (BID) | ORAL | 0 refills | Status: DC

## 2018-07-19 NOTE — ED Triage Notes (Signed)
Onset 5 days ago sore throat.  Onset yesterday cough.  Onset today fever 102.0 at home, took Tylenol.  Cough interfered with sleep last night.  Pt is tolerating PO fluids.  Pt wants to be seen for worsening chronic mid back pain and right hip pain.  Pt usually doesn't take anything for back/hip pain.

## 2018-07-19 NOTE — ED Notes (Signed)
Patient verbalizes understanding of discharge instructions. Opportunity for questioning and answers were provided. Armband removed by staff, pt discharged from ED in wheelchair.  

## 2018-07-19 NOTE — Discharge Instructions (Addendum)
We saw in the ER for the back pain and a sore throat. The results of the ER show that you have arthritic changes to your spine and likely has some disc problems.  There is no evidence of fractures or large misalignment.  We recommend that you continue seeing your primary care doctor.  Perhaps you might benefit with some physical therapy.  Also take anti-inflammatory medications for your pain in the throat and the back.

## 2018-07-19 NOTE — ED Provider Notes (Signed)
[facility] Provider Note   CSN: [id number] Arrival date & time: 07/19/18  1143     History   Chief Complaint Chief Complaint  Patient presents with  . Sore Throat  . Cough  . Back Pain    HPI Arlyss RepressSacoya M Knab is a 28 y.o. female.  HPI  28 year old female comes in with chief complaint of sore throat, bumps to her tongue and also chronic back pain.  Her main complaint today is sore throat.  She reports that she has been having sore throat for the last 4 to 5 days.  With that she has no voice change, drooling, difficulty in swallowing.  She has been having subjective fevers, however she has not checked her temperature.  Patient has taken ibuprofen at home.  Review of system is positive for dry cough and some congestion.  Denies any sick contacts.  Additionally patient is stating that she has been noticing bumps to the back of her tongue.  These lesions are nonpainful.  She is also complaining of chronic back pain.  She reports that the pain is fairly constant and she was diagnosed with hip dysplasia.  She has mentioned her back pain to her PCP but not had received any x-ray or advanced imaging. Pt has no associated numbness, weakness, urinary incontinence, urinary retention, bowel incontinence, pins and needle sensation in the perineal area.  She does however report that her legs occasionally will give out.   Past Medical History:  Diagnosis Date  . Arthritis   . Hypertension   . Obesity   . Sciatica     Patient Active Problem List   Diagnosis Date Noted  . Osteoarthritis of both knees 09/29/2015  . 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  . Low back pain with right-sided sciatica 09/15/2015  . Severe obesity (BMI >= 40) (HCC) 12/02/2006  . Primary hypertension 08/28/2006    Past Surgical History:  Procedure Laterality Date  . TOOTH EXTRACTION N/A 10/22/2013   Procedure: DENTAL EXTRACTIONS TEETH #1, 16, 17,  32;  Surgeon: Georgia LopesScott M Jensen, DDS;  Location: [facility] OR;  Service: Oral Surgery;  Laterality: N/A;     OB History    Gravida  4   Para  3   Term  3   Preterm      AB  1   Living  3     SAB      TAB  1   Ectopic      Multiple  0   Live Births  3            Home Medications    Prior to Admission medications   Medication Sig Start Date End Date Taking? Authorizing Provider  amitriptyline (ELAVIL) 10 MG tablet Take 1 tablet (10 mg total) by mouth at bedtime. 04/14/18   Westley ChandlerBrown, Carina M, MD  amLODipine (NORVASC) 10 MG tablet Take 1 tablet (10 mg total) by mouth daily. 01/08/18   Wendee BeaversMcMullen, David J, DO  clonazePAM (KLONOPIN) 0.5 MG tablet TAKE 1 TABLET 1 HOUR PRIOR TO MRI. 01/08/18   Wendee BeaversMcMullen, David J, DO    Family History Family History  Problem Relation Age of Onset  . Hypertension Mother   . Diabetes Maternal Grandmother   . Diabetes Paternal Grandmother   . Stroke Paternal Grandmother     Social History Social History   Tobacco Use  . Smoking status: Former Smoker    Packs/day: 0.50    Types: Cigarettes  Last attempt to quit: 01/29/2017    Years since quitting: 1.4  . Smokeless tobacco: Never Used  Substance Use Topics  . Alcohol use: No    Alcohol/week: 0.0 standard drinks    Frequency: Never    Comment: rare  . Drug use: No    Types: Marijuana    Comment: daily usage     Allergies   Patient has no known allergies.   Review of Systems Review of Systems  All other systems reviewed and are negative.   Physical Exam Updated Vital Signs BP (!) 173/102   Pulse 81   Temp 98.4 F (36.9 C) (Oral)   Resp 16   LMP 06/27/2018   SpO2 100%   Physical Exam Vitals signs and nursing note reviewed.  Constitutional:      Appearance: She is well-developed.  HENT:     Head: Atraumatic.     Comments: Patient has erythematous posterior pharynx, without any exudates.  There is minimal cervical lymphadenopathy. No trismus, no drooling or  stridor. Patient is nontoxic.  Patient also has multiple macular lesions to her tongue posteriorly. Neck:     Musculoskeletal: Normal range of motion and neck supple.  Cardiovascular:     Rate and Rhythm: Normal rate.  Pulmonary:     Effort: Pulmonary effort is normal.  Abdominal:     General: Bowel sounds are normal.  Skin:    General: Skin is warm and dry.  Neurological:     Mental Status: She is alert and oriented to person, place, and time.    Musculoskeletal Pt has tenderness over the lumbar region No step offs, no erythema. Pt has 2+ patellar reflex bilaterally. Able to discriminate between sharp and dull. Able to ambulate   ED Treatments / Results  Labs (all labs ordered are listed, but only abnormal results are displayed) Labs Reviewed  PREGNANCY, URINE  I-STAT BETA HCG BLOOD, ED ([facility], [facility], [facility] ONLY)    EKG None  Radiology No results found.  Procedures Procedures (including critical care time)  Medications Ordered in ED Medications  naproxen (NAPROSYN) tablet 500 mg (500 mg Oral Given 07/19/18 1217)  magic mouthwash (5 mLs Oral Given 07/19/18 1240)     Initial Impression / Assessment and Plan / ED Course  I have reviewed the triage vital signs and the nursing notes.  Pertinent labs & imaging results that were available during my care of the patient were reviewed by me and considered in my medical decision making (see chart for details).     Patient comes in with multiple complaints.  Her main complaint is sore throat that has been going on 3 to 4 days.  No red flags on history and exam to reveal deep space infection.  He does not appear that patient is strep infection because she has cough without any known fevers here or cervical lymphadenopathy.  We suspect that she is having a viral URI.  Additionally, patient is complaining of lesions to her tongue.  It appears that she has hypertrophic tongue -and this is likely benign lesions.  Advised her to  continue following up with her PCP about the tongue lesions, especially if they are changing or evolving.  Finally she is also having back pain.  The back pain is chronic in nature.  Unfortunately, patient has discussed her spine issues with her PCP, but she has not had any advanced imaging.  She has known history of right hip dysplasia.  Given that she has attempted to discuss  this matter with her PCP without any yield we will get an x-ray, but patient already has been made aware that we will not have any simple resolution to help her with her chronic back pain.  Final Clinical Impressions(s) / ED Diagnoses   Final diagnoses:  Hypertrophy, tongue, papillae  Viral pharyngitis  Chronic back pain greater than 3 months duration    ED Discharge Orders    None       Derwood Kaplan, MD 07/19/18 1310

## 2018-07-20 NOTE — Telephone Encounter (Signed)
Spoke to patient and she will be dropping a letter off stating what she can not do.  She is trying  to get disability and has been off from work for 6 years.  Glennie Hawk.Sherin Murdoch R, CMA

## 2018-07-23 ENCOUNTER — Encounter: Payer: Self-pay | Admitting: Family Medicine

## 2018-07-23 ENCOUNTER — Telehealth: Payer: Self-pay | Admitting: Family Medicine

## 2018-07-23 NOTE — Telephone Encounter (Signed)
Letter and envelope upfront ready for pickup.  Attempted to contact patient, LVM instructing patient to contact clinic to discuss the process of pursuing long-term disability.

## 2018-07-23 NOTE — Telephone Encounter (Signed)
Patient dropped off a handwritten note, per your request.  It is in your box.   Also , patient is requesting that you write her a letter stating she needs a flat level apartment, or residence.  Best number to reach her with questions is [phone]

## 2018-07-23 NOTE — Telephone Encounter (Signed)
Letter and envelope upfront ready for pickup.  Attempted to contact patient, LVM instructing patient to contact clinic to discuss the process of pursuing long-term disability. 

## 2018-07-24 NOTE — Telephone Encounter (Signed)
Called and informed patient of paperwork placed up front for pick up per Dr. Abelardo Diesel.  Glennie Hawk, CMA

## 2018-07-29 ENCOUNTER — Telehealth: Payer: Self-pay

## 2018-07-29 NOTE — Telephone Encounter (Signed)
Patient called to ask if PCP will write orders/prescription for a cane and walker to help her get around due to her back and hip pain.  Call back is [phone].  Ples Specter, RN [facility] RN)

## 2018-07-30 ENCOUNTER — Other Ambulatory Visit: Payer: Self-pay | Admitting: Family Medicine

## 2018-07-30 DIAGNOSIS — R262 Difficulty in walking, not elsewhere classified: Secondary | ICD-10-CM

## 2018-07-30 NOTE — Telephone Encounter (Signed)
Patient will first need PT evaluation to assess ambulation status.  Referral placed.  Please advise.

## 2018-08-04 NOTE — Telephone Encounter (Signed)
form dropped off for at front desk for completion.  Verified that patient section of form has been completed.  Last DOS/WCC with PCP was01/09/20.  Placed form in team folder to be completed by clinical staff.  Shirley [name]

## 2018-08-04 NOTE — Telephone Encounter (Signed)
Viewed form requesting flat level apartment and placed in PCP's box for completion.  Glennie Hawk, CMA

## 2018-08-05 NOTE — Telephone Encounter (Signed)
Form completed. Left in RN box.  Durward Parcel, DO [facility], PGY-3

## 2018-08-05 NOTE — Telephone Encounter (Signed)
Called and informed patient of letter being placed up front.  Glennie Hawk, CMA

## 2018-08-19 ENCOUNTER — Ambulatory Visit: Payer: Medicaid Other | Attending: Family Medicine | Admitting: Physical Therapy

## 2018-09-16 ENCOUNTER — Encounter (HOSPITAL_COMMUNITY): Payer: Self-pay | Admitting: Emergency Medicine

## 2018-09-16 ENCOUNTER — Emergency Department (HOSPITAL_COMMUNITY)
Admission: EM | Admit: 2018-09-16 | Discharge: 2018-09-16 | Disposition: A | Discharge status: Home / Self Care | Payer: Medicaid Other | Attending: Emergency Medicine | Admitting: Emergency Medicine

## 2018-09-16 ENCOUNTER — Other Ambulatory Visit: Payer: Self-pay

## 2018-09-16 DIAGNOSIS — Y939 Activity, unspecified: Secondary | ICD-10-CM | POA: Insufficient documentation

## 2018-09-16 DIAGNOSIS — Y999 Unspecified external cause status: Secondary | ICD-10-CM | POA: Insufficient documentation

## 2018-09-16 DIAGNOSIS — T148XXA Other injury of unspecified body region, initial encounter: Secondary | ICD-10-CM

## 2018-09-16 DIAGNOSIS — Y929 Unspecified place or not applicable: Secondary | ICD-10-CM | POA: Insufficient documentation

## 2018-09-16 DIAGNOSIS — I10 Essential (primary) hypertension: Secondary | ICD-10-CM | POA: Diagnosis not present

## 2018-09-16 DIAGNOSIS — Z79899 Other long term (current) drug therapy: Secondary | ICD-10-CM | POA: Insufficient documentation

## 2018-09-16 DIAGNOSIS — Z87891 Personal history of nicotine dependence: Secondary | ICD-10-CM | POA: Insufficient documentation

## 2018-09-16 DIAGNOSIS — L089 Local infection of the skin and subcutaneous tissue, unspecified: Secondary | ICD-10-CM | POA: Diagnosis not present

## 2018-09-16 DIAGNOSIS — S80212D Abrasion, left knee, subsequent encounter: Secondary | ICD-10-CM | POA: Insufficient documentation

## 2018-09-16 DIAGNOSIS — W19XXXD Unspecified fall, subsequent encounter: Secondary | ICD-10-CM | POA: Insufficient documentation

## 2018-09-16 DIAGNOSIS — T798XXA Other early complications of trauma, initial encounter: Secondary | ICD-10-CM | POA: Diagnosis not present

## 2018-09-16 MED ORDER — DOXYCYCLINE HYCLATE 100 MG PO CAPS: 100.0000 mg | ORAL_CAPSULE | Freq: Two times a day (BID) | ORAL | 0 refills | Status: DC

## 2018-09-16 MED ORDER — BACITRACIN ZINC 500 UNIT/GM EX OINT: 1.0000 "application " | TOPICAL_OINTMENT | Freq: Two times a day (BID) | CUTANEOUS | 0 refills | Status: DC

## 2018-09-16 MED ORDER — ACETAMINOPHEN 325 MG PO TABS
650.0000 mg | ORAL_TABLET | Freq: Once | ORAL | Status: AC
  Administered 2018-09-16: 650 mg via ORAL
  Filled 2018-09-16: qty 2

## 2018-09-16 MED ORDER — BACITRACIN ZINC 500 UNIT/GM EX OINT
TOPICAL_OINTMENT | Freq: Two times a day (BID) | CUTANEOUS | Status: DC
  Administered 2018-09-16: 1 via TOPICAL
  Filled 2018-09-16: qty 0.9

## 2018-09-16 NOTE — ED Triage Notes (Signed)
Patient reports mechanical fall down 4 concrete steps 4 days ago - c/o L lower leg pain - initially swelling went down but has increased again and pain has worsened. Patient has abrasion to anterior leg, just under knee. Bandaids removed - wound has serosanguinous drainage. Swelling and minor hematomas noted as well.

## 2018-09-16 NOTE — Discharge Instructions (Signed)
We saw you in the ER for your WOUND. Please read the instructions provided on wound care. Keep the area clean and dry, apply bacitracin ointment daily and take the medications provided. Start taking oral doxycycline only if you are not getting better in 2 days.  RETURN TO THE ER IF THERE IS INCREASED PAIN, REDNESS, PUS COMING OUT from the wound site.

## 2018-09-16 NOTE — ED Provider Notes (Signed)
[name] EMERGENCY DEPARTMENT Provider Note   CSN: [id number] Arrival date & time: 09/16/18  1713    History   Chief Complaint Chief Complaint  Patient presents with  . Leg Pain    HPI Shirley [name] is a 28 y.o. female.     HPI  28 year old female comes in a chief complaint of leg pain.  Patient reports that she had a mechanical fall 2 or 3 days ago and she ended up with abrasion to her left knee.  Since then she has noted increased swelling along with pain at the site of abrasion and surrounding tissue.  Patient denies any associated fevers, chills.  Patient continues to ambulate with discomfort.  Past Medical History:  Diagnosis Date  . Arthritis   . Hypertension   . Obesity   . Sciatica     Patient Active Problem List   Diagnosis Date Noted  . Osteoarthritis of both knees 09/29/2015  . 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  . Low back pain with right-sided sciatica 09/15/2015  . Severe obesity (BMI >= 40) (HCC) 12/02/2006  . Primary hypertension 08/28/2006    Past Surgical History:  Procedure Laterality Date  . TOOTH EXTRACTION N/A 10/22/2013   Procedure: DENTAL EXTRACTIONS TEETH #1, 16, 17, 32;  Surgeon: Georgia Lopes, DDS;  Location: [facility] OR;  Service: Oral Surgery;  Laterality: N/A;     OB History    Gravida  4   Para  3   Term  3   Preterm      AB  1   Living  3     SAB      TAB  1   Ectopic      Multiple  0   Live Births  3            Home Medications    Prior to Admission medications   Medication Sig Start Date End Date Taking? Authorizing Provider  amitriptyline (ELAVIL) 10 MG tablet Take 1 tablet (10 mg total) by mouth at bedtime. 04/14/18  Yes Westley Chandler, MD  amLODipine (NORVASC) 10 MG tablet Take 1 tablet (10 mg total) by mouth daily. 01/08/18  Yes Wendee Beavers, DO  methocarbamol (ROBAXIN) 500 MG tablet Take 1 tablet (500 mg total) by mouth 2 (two) times daily.  07/19/18  Yes Tyjai Matuszak, MD  naproxen (NAPROSYN) 500 MG tablet Take 1 tablet (500 mg total) by mouth 2 (two) times daily. 07/19/18  Yes Eliott Amparan, MD  naproxen sodium (ALEVE) 220 MG tablet Take 440 mg by mouth daily as needed (pain).   Yes [provider]  bacitracin ointment Apply 1 application topically 2 (two) times daily. 09/16/18   Derwood Kaplan, MD  clonazePAM (KLONOPIN) 0.5 MG tablet TAKE 1 TABLET 1 HOUR PRIOR TO MRI. Patient not taking: Reported on 09/16/2018 01/08/18   Wendee Beavers, DO  doxycycline (VIBRAMYCIN) 100 MG capsule Take 1 capsule (100 mg total) by mouth 2 (two) times daily. 09/16/18   Derwood Kaplan, MD    Family History Family History  Problem Relation Age of Onset  . Hypertension Mother   . Diabetes Maternal Grandmother   . Diabetes Paternal Grandmother   . Stroke Paternal Grandmother     Social History Social History   Tobacco Use  . Smoking status: Former Smoker    Packs/day: 0.50    Types: Cigarettes    Last attempt to quit: 01/29/2017    Years since quitting:  1.6  . Smokeless tobacco: Never Used  Substance Use Topics  . Alcohol use: No    Alcohol/week: 0.0 standard drinks    Frequency: Never    Comment: rare  . Drug use: No    Types: Marijuana    Comment: daily usage     Allergies   Patient has no known allergies.   Review of Systems Review of Systems  Constitutional: Positive for activity change.  Skin: Positive for wound.  Allergic/Immunologic: Negative for immunocompromised state.     Physical Exam Updated Vital Signs BP (!) 159/102 (BP Location: Right Arm)   Pulse 83   Temp 98.6 F (37 C) (Oral)   Resp 16   LMP 08/27/2018 (Exact Date)   SpO2 98%   Physical Exam Vitals signs and nursing note reviewed.  Constitutional:      Appearance: Normal appearance.  Skin:    Findings: Rash present.     Comments: Patient has 3 cm x 2 cm skin abrasion at the infrapatellar region. There is mild erythema and edema  surrounding the wound site with tenderness to palpation.      ED Treatments / Results  Labs (all labs ordered are listed, but only abnormal results are displayed) Labs Reviewed - No data to display  EKG None  Radiology No results found.  Procedures Procedures (including critical care time)  Medications Ordered in ED Medications  bacitracin ointment (1 application Topical Given 09/16/18 1850)  acetaminophen (TYLENOL) tablet 650 mg (650 mg Oral Given 09/16/18 1850)     Initial Impression / Assessment and Plan / ED Course  I have reviewed the triage vital signs and the nursing notes.  Pertinent labs & imaging results that were available during my care of the patient were reviewed by me and considered in my medical decision making (see chart for details).        28 year old comes in with chief complaint of wound evaluation.  Patient appears to have a localized infection at the site of her wound /traumatic abrasion. At this time it does not appear that she has severe infection.  We will start with topical antibiotics and she will get prescription for oral antibiotics that she can take only if her symptoms are getting worse.  I do not think patient has fractured bone as she has been walking the entire time.   Final Clinical Impressions(s) / ED Diagnoses   Final diagnoses:  Abrasion  Wound infection    ED Discharge Orders         Ordered    bacitracin ointment  2 times daily     09/16/18 1918    doxycycline (VIBRAMYCIN) 100 MG capsule  2 times daily     09/16/18 1919           Derwood Kaplan, MD 09/16/18 Shirley [name]

## 2018-09-28 IMAGING — US US FETAL BPP W/ NON-STRESS
1 series · 13 of 14 positions shown · non-contrast
Comparison: none

[Series 1: us fetal bpp w/nonstress · 14 acquisitions, 13 frames shown]
[im 1/14]
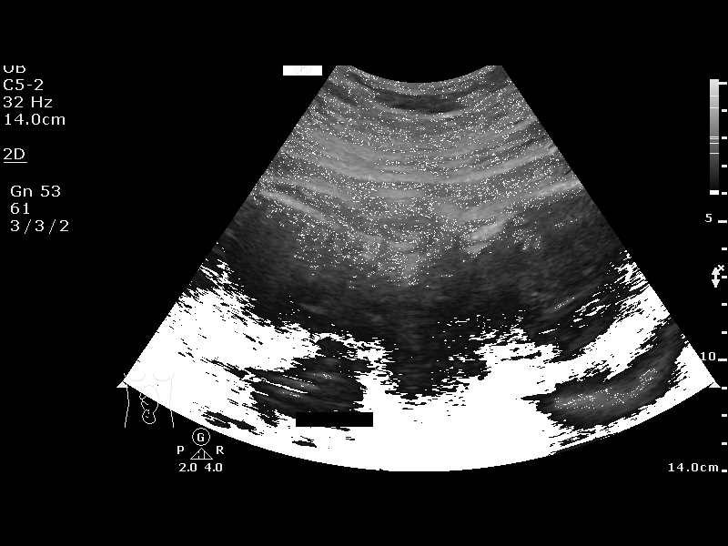
[im 2/14]
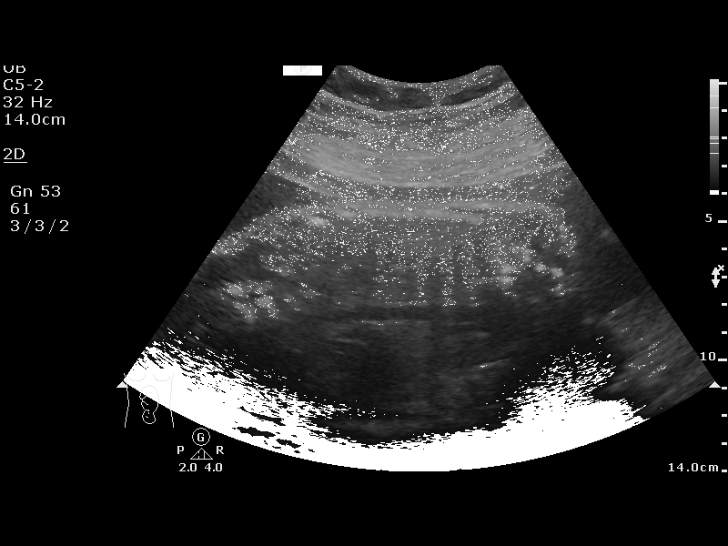
[im 3/14]
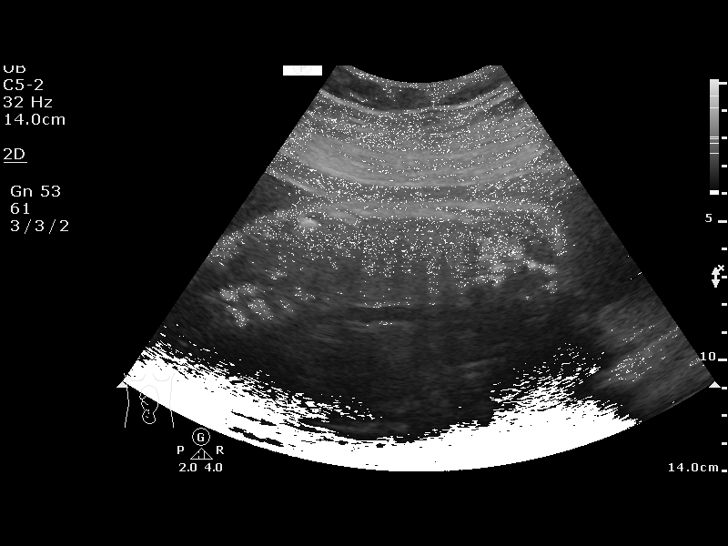
[im 4/14]
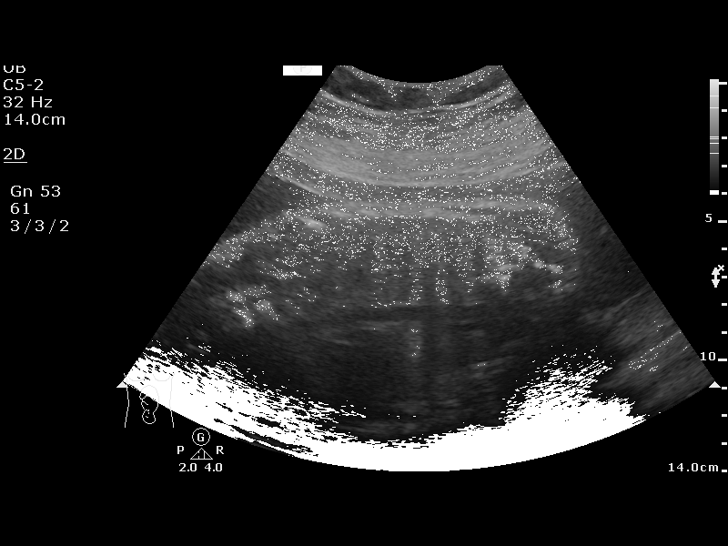
[im 5/14]
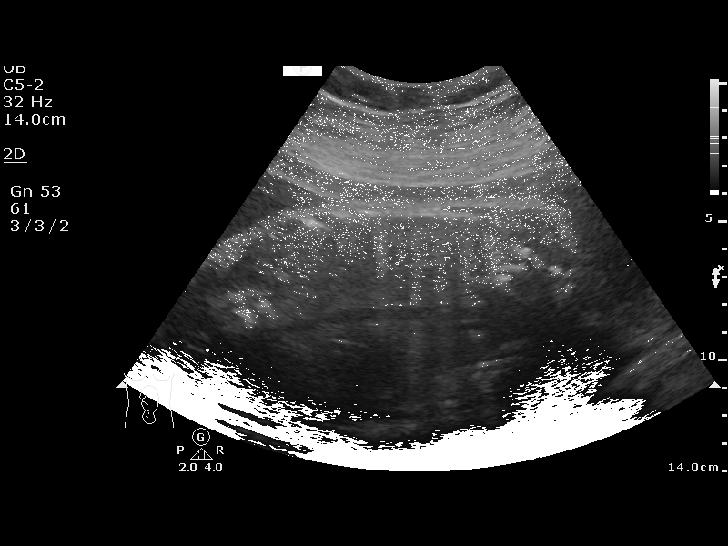
[im 6/14]
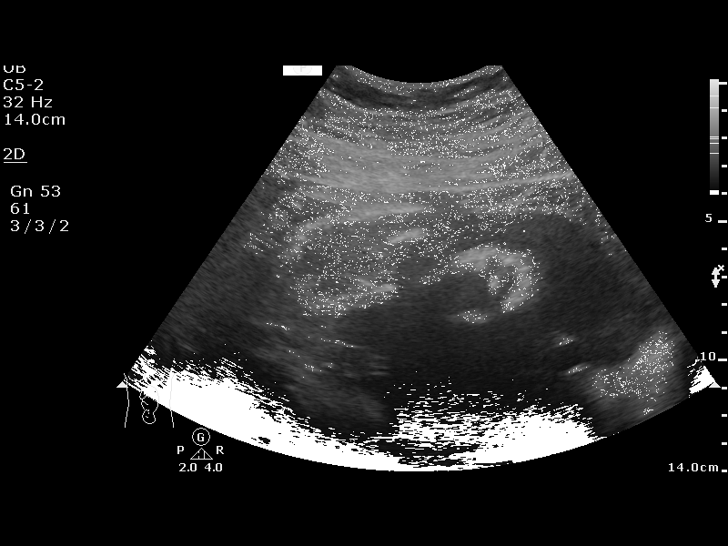
[im 8/14]
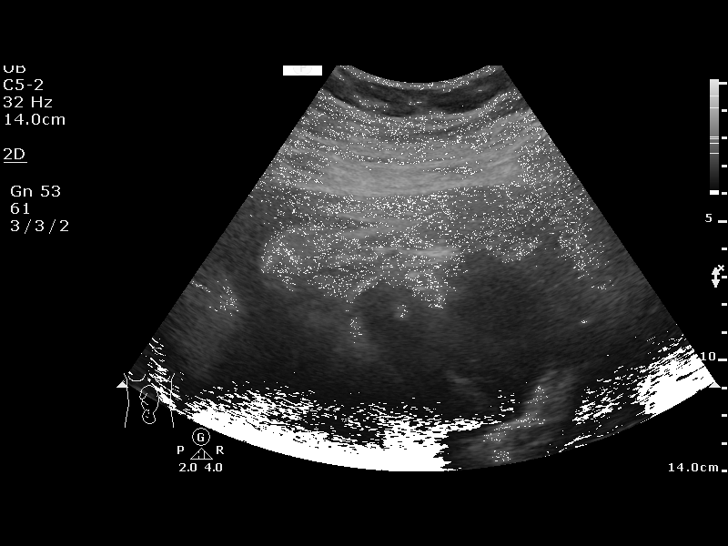
[im 9/14]
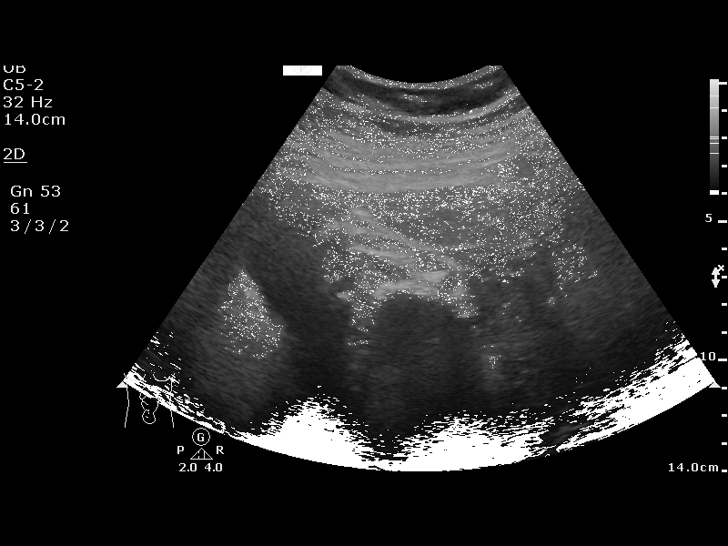
[im 10/14]
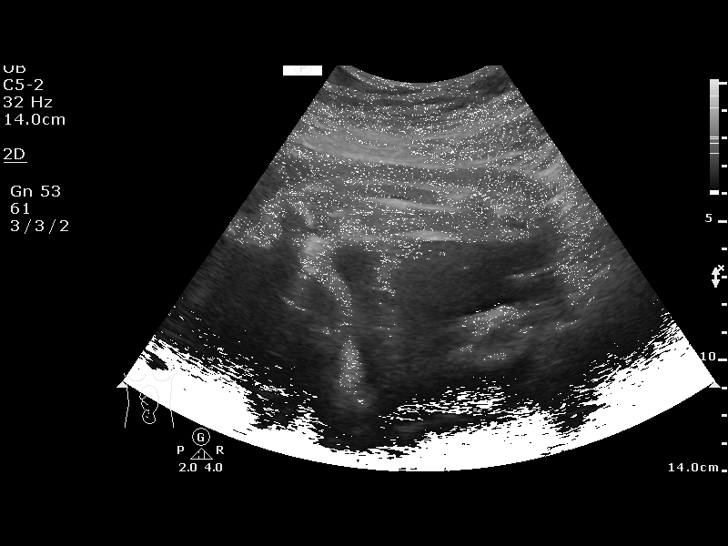
[im 11/14]
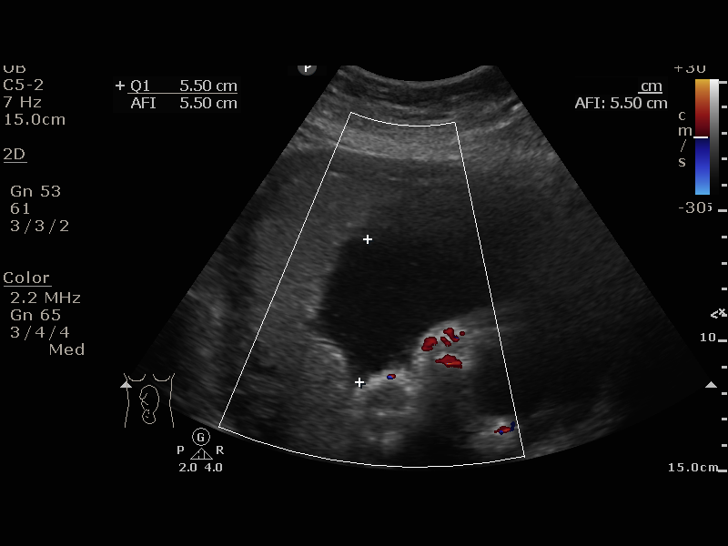
[im 12/14]
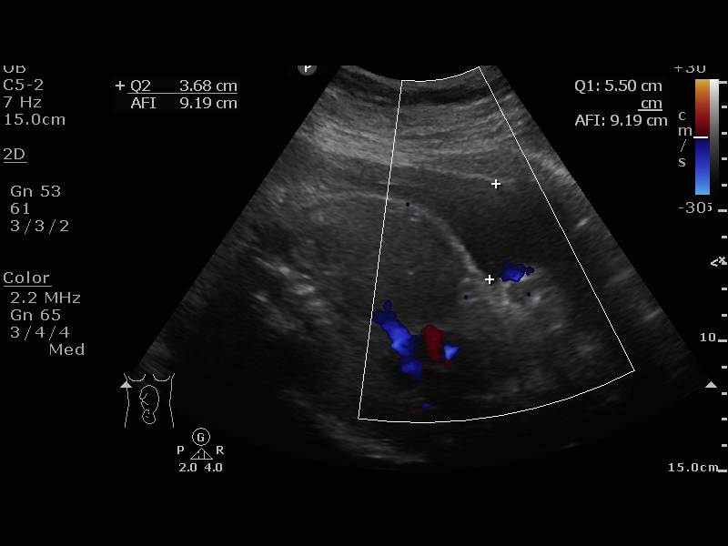
[im 13/14]
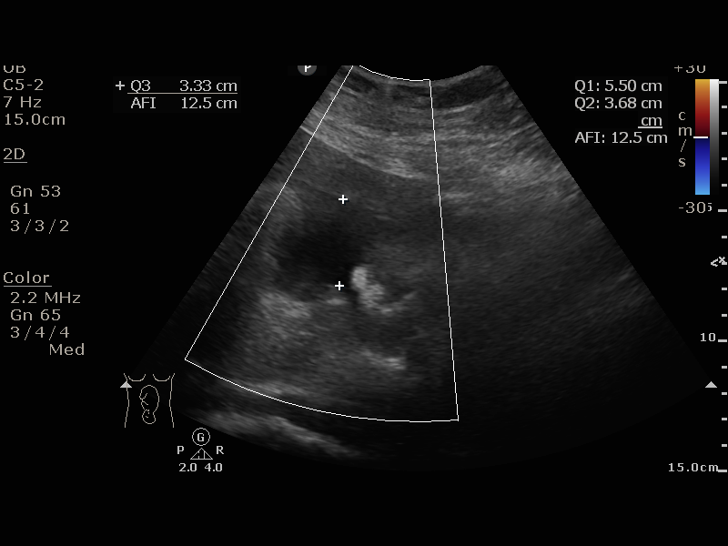
[im 14/14]
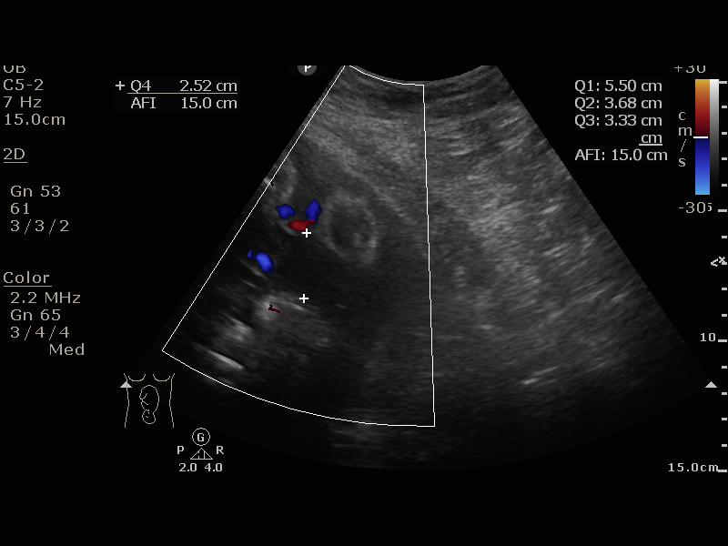

[13 of 14 positions shown; findings below may reference images not displayed]

[facility] Clinic
[REDACTED]
Ref. Address:     Faculty

1  US FETAL BPP W/NONSTRESS                    [address].4

1  [place] [place]              [id number]      [id number]     [id number]
Service(s) Provided

Indications

33 weeks gestation of pregnancy
Unspecified pre-existing hypertension
complicating pregnancy, third trimester
OB History

Blood Type:            Height:  5'7"   Weight (lb):  295      BMI:
Gravidity:    4         Term:   2
TOP:          1        Living:  2
Fetal Evaluation

Num Of Fetuses:     1
Preg. Location:     Intrauterine
Cardiac Activity:   Observed
Presentation:       Cephalic

Amniotic Fluid
AFI FV:      Subjectively within normal limits

AFI Sum(cm)     %Tile       Largest Pocket(cm)
15.03           53
RUQ(cm)       RLQ(cm)       LUQ(cm)        LLQ(cm)
5.5
Biophysical Evaluation

Amniotic F.V:   Pocket => 2 cm two         F. Tone:        Observed
planes
F. Movement:    Observed                   N.S.T:          Reactive
F. Breathing:   Observed                   Score:          [DATE]
Gestational Age

LMP:           33w 2d       Date:   02/15/17                 EDD:   11/22/17
Best:          33w 2d    Det. By:   LMP  (02/15/17)          EDD:   11/22/17
Comments

Technically limited exam due to maternal body habitus.
Impression

BPP [DATE]
Recommendations

Continue antenatal testing

## 2018-10-22 IMAGING — US US MFM FETAL BPP W/O NON-STRESS
1 series · 15 of 25 positions shown · non-contrast
Comparison: none

[Series 1: us mfm fetal bpp w/o non-stress · 25 acquisitions, 15 frames shown]
[im 1/25]
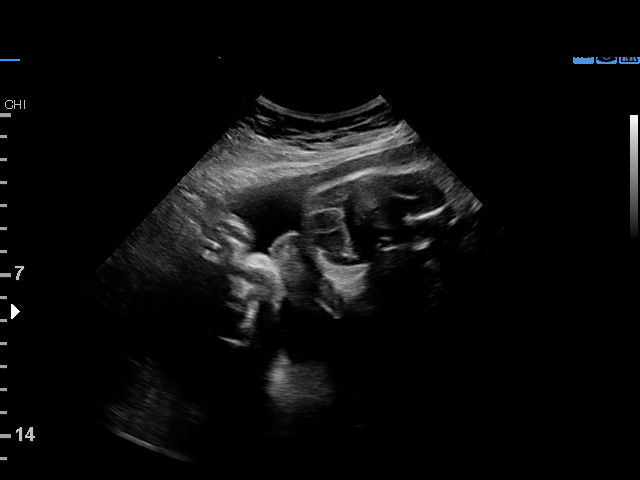
[im 3/25]
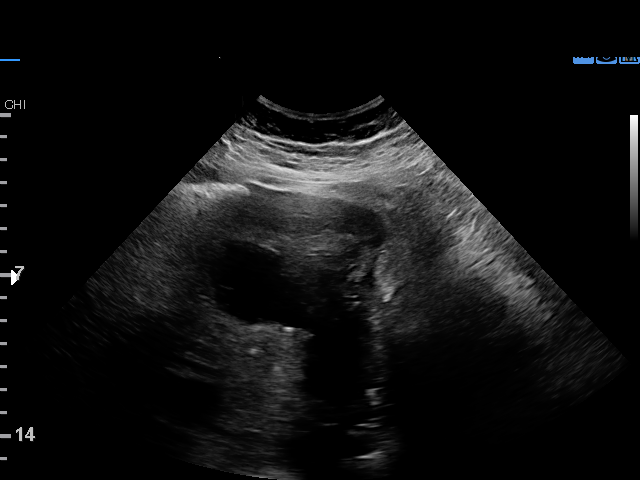
[im 5/25]
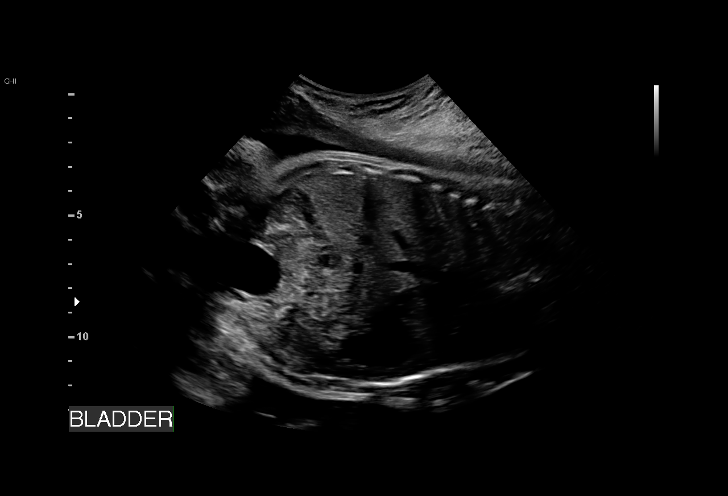
[im 6/25]
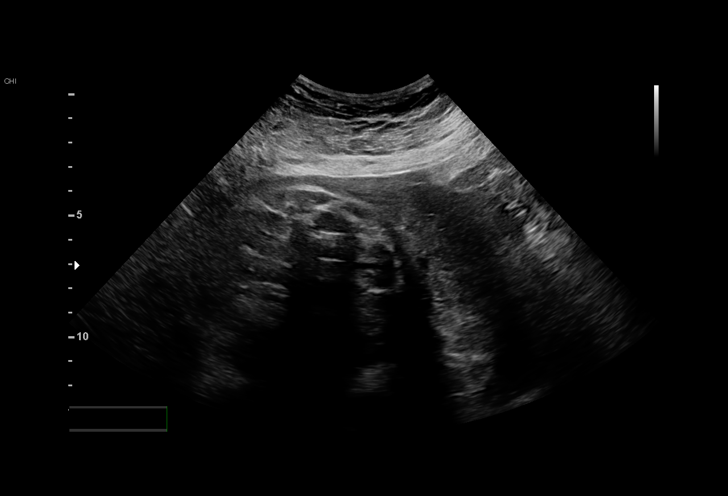
[im 8/25]
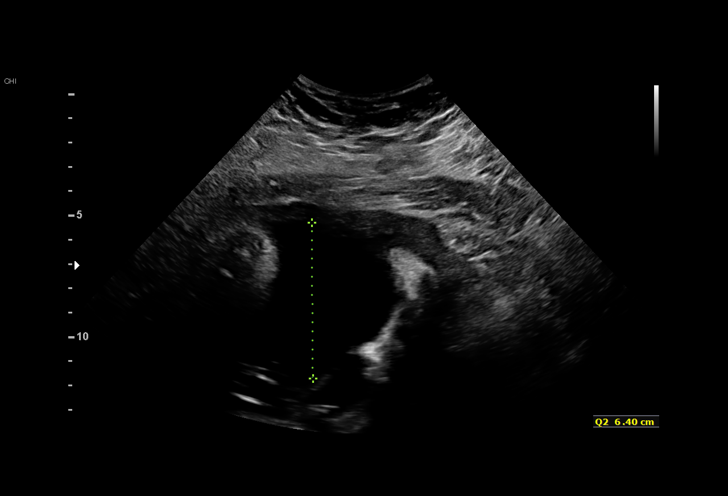
[im 10/25]
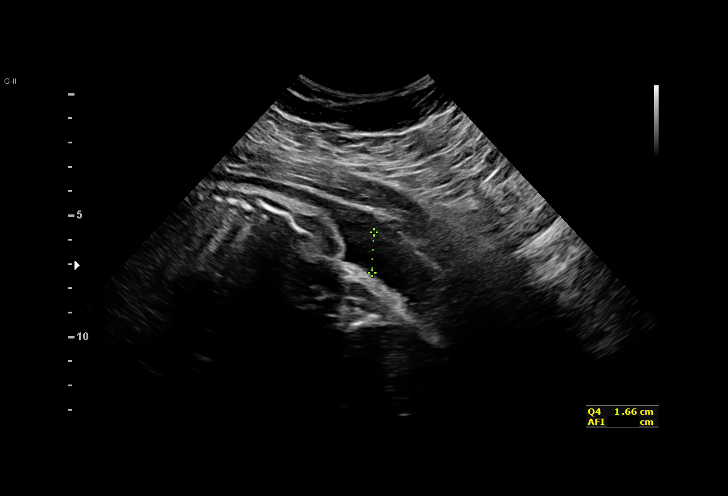
[im 11/25]
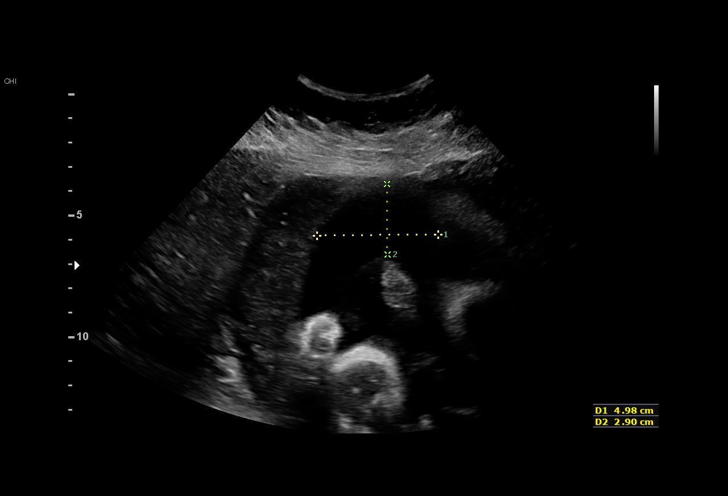
[im 13/25]
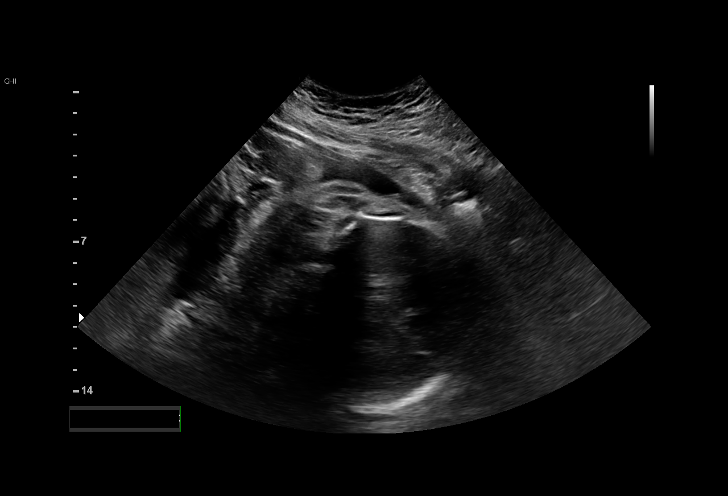
[im 15/25]
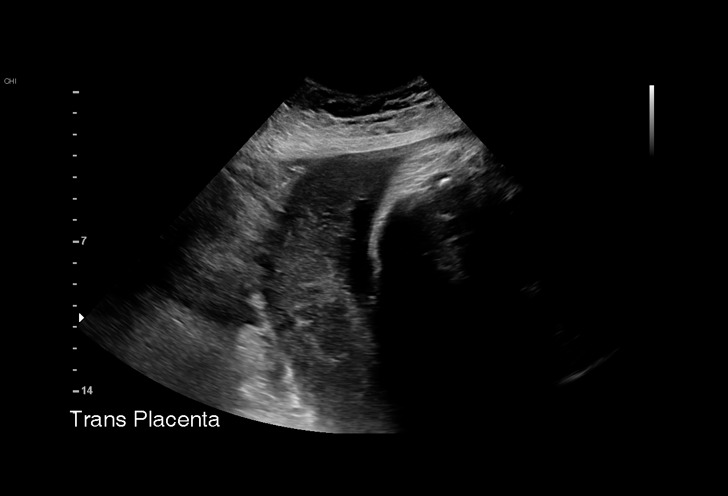
[im 16/25]
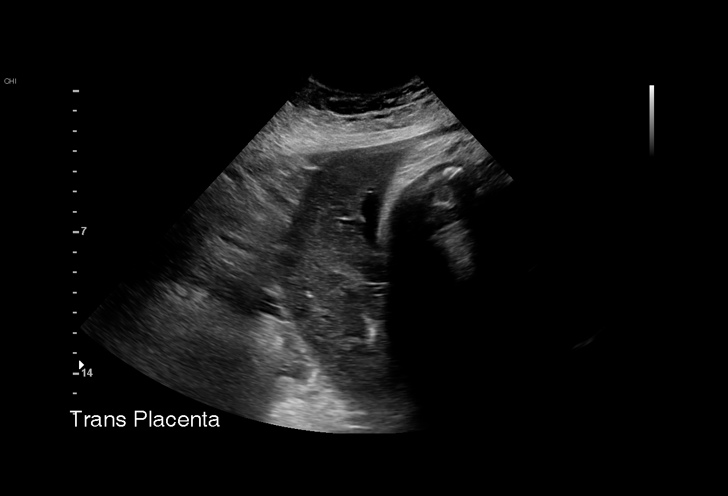
[im 18/25]
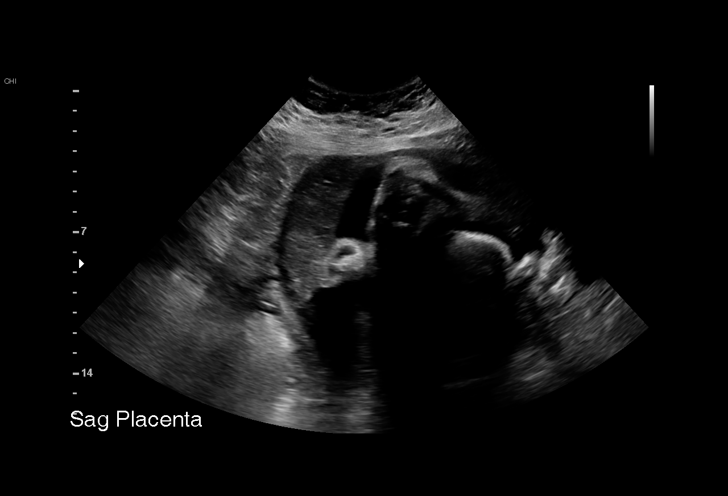
[im 20/25]
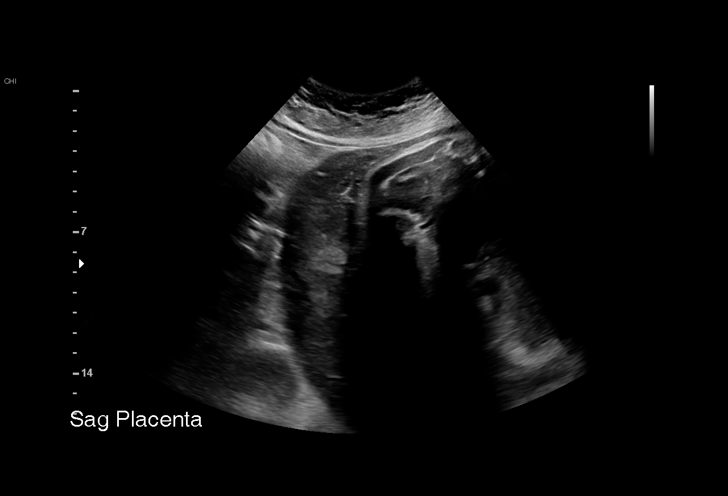
[im 21/25]
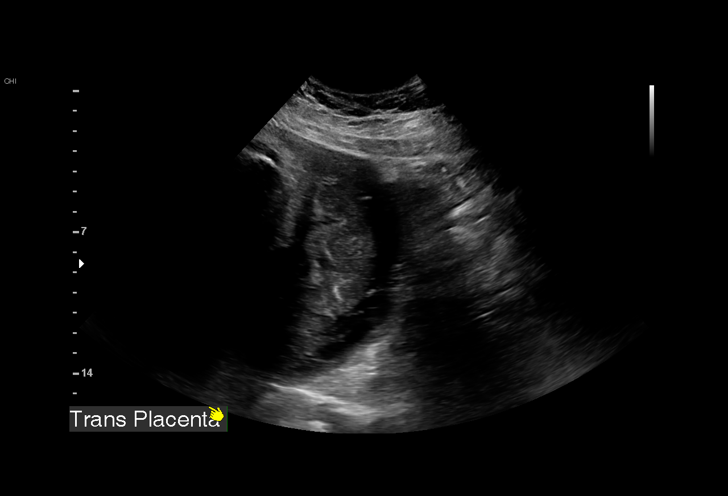
[im 23/25]
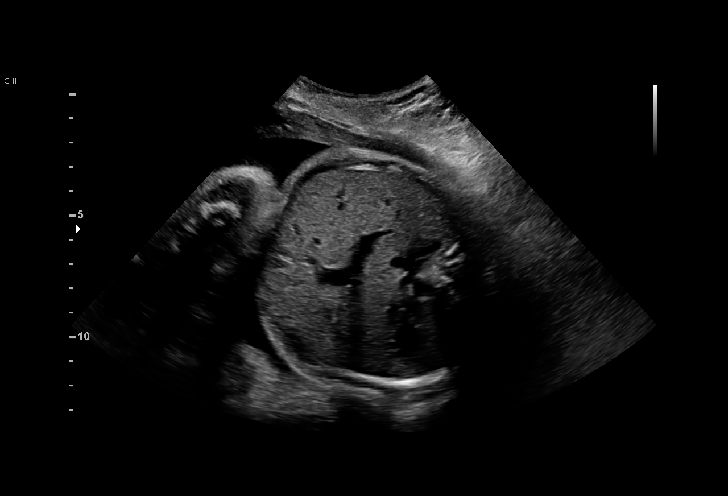
[im 25/25]
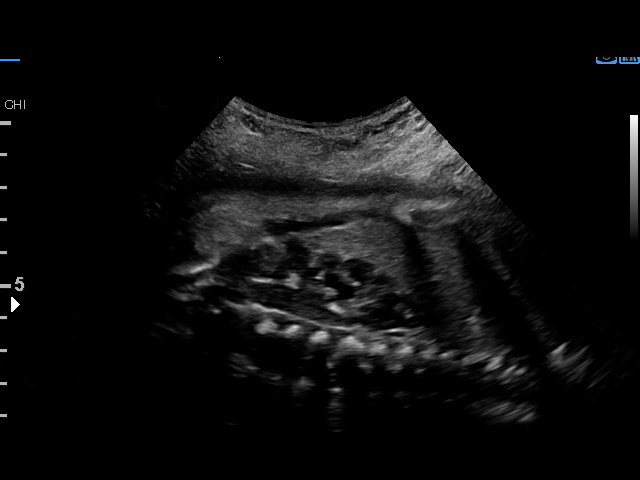

[15 of 25 positions shown; findings below may reference images not displayed]

OB/Gyn Clinic

[facility]
Indications

36 weeks gestation of pregnancy
Unspecified pre-existing hypertension
complicating pregnancy, third trimester
OB History

Blood Type:            Height:  5'7"   Weight (lb):  295       BMI:
Gravidity:    4         Term:   2
TOP:          1        Living:  2
Fetal Evaluation

Num Of Fetuses:     1
Fetal Heart         164
Rate(bpm):
Cardiac Activity:   Observed
Presentation:       Cephalic

Amniotic Fluid
AFI FV:      Subjectively within normal limits

AFI Sum(cm)     %Tile       Largest Pocket(cm)
13.83           51

RUQ(cm)       RLQ(cm)       LUQ(cm)        LLQ(cm)
2.8
Biophysical Evaluation

Amniotic F.V:   Pocket => 2 cm two         F. Tone:        Observed
planes
F. Movement:    Observed                   Score:          [DATE]
F. Breathing:   Observed
Gestational Age

LMP:           36w 5d        Date:  02/15/17                 EDD:   11/22/17
Best:          36w 5d     Det. By:  LMP  (02/15/17)          EDD:   11/22/17
Impression

Intrauterine pregnancy at 36+4 weeks with CHTN
Normal amniotic fluid
BPP [DATE]
Recommendations

Continue clinical evaluation and management.

## 2018-11-05 ENCOUNTER — Ambulatory Visit: Payer: Medicaid Other

## 2018-11-12 ENCOUNTER — Ambulatory Visit: Payer: Medicaid Other | Admitting: Family Medicine

## 2018-11-17 ENCOUNTER — Ambulatory Visit: Payer: Medicaid Other | Admitting: Family Medicine

## 2018-11-24 ENCOUNTER — Ambulatory Visit: Payer: Medicaid Other | Admitting: Family Medicine

## 2018-11-24 ENCOUNTER — Encounter: Payer: Self-pay | Admitting: Family Medicine

## 2018-12-24 ENCOUNTER — Ambulatory Visit: Payer: Medicaid Other | Admitting: Family Medicine

## 2018-12-30 NOTE — Progress Notes (Deleted)
   Subjective:    Patient ID: Shirley [name], female    DOB: [birth year], 28 y.o.   MRN: [id number]   CC:  HPI: Leg, back, hip [name] Has been seen by her previous PCP for this in the past.  Patient was placed on Cymbalta 30 mg daily for 1 week then 60 mg daily.  Was also recommended for MRI.  Patient at that time had tramadol and Tylenol as needed.  Last seen in 2017. PMP aware reviewed and no narcotics listed, tramadol is not listed as well. MRI from 06/11/2016 showing no significant stenosis.  Is referred to neurosurgery by PCP on 7-05/2018.  STD check  Smoking status reviewed  Review of Systems   Objective:  There were no vitals taken for this visit. Vitals and nursing note reviewed  General: well nourished, in no acute distress HEENT: normocephalic, TM's visualized bilaterally, no scleral icterus or conjunctival pallor, no nasal discharge, moist mucous membranes, good dentition without erythema or discharge noted in posterior oropharynx Neck: supple, non-tender, without lymphadenopathy Cardiac: RRR, clear S1 and S2, no murmurs, rubs, or gallops Respiratory: clear to auscultation bilaterally, no increased work of breathing Abdomen: soft, nontender, nondistended, no masses or organomegaly. Bowel sounds present Extremities: no edema or cyanosis. Warm, well perfused. 2+ radial and PT pulses bilaterally Skin: warm and dry, no rashes noted Neuro: alert and oriented, no focal deficits   Assessment & Plan:    No problem-specific Assessment & Plan notes found for this encounter.    No follow-ups on file.   Caroline More, DO, PGY-2

## 2018-12-31 ENCOUNTER — Ambulatory Visit: Payer: Medicaid Other | Admitting: Family Medicine

## 2019-01-07 ENCOUNTER — Ambulatory Visit: Payer: Medicaid Other | Admitting: Family Medicine

## 2019-01-15 ENCOUNTER — Encounter (HOSPITAL_COMMUNITY): Payer: Self-pay

## 2019-01-15 ENCOUNTER — Emergency Department (HOSPITAL_COMMUNITY)
Admission: EM | Admit: 2019-01-15 | Discharge: 2019-01-16 | Disposition: A | Discharge status: Home / Self Care | Payer: Medicaid Other | Attending: Emergency Medicine | Admitting: Emergency Medicine

## 2019-01-15 ENCOUNTER — Emergency Department (HOSPITAL_COMMUNITY): Payer: Medicaid Other

## 2019-01-15 ENCOUNTER — Other Ambulatory Visit: Payer: Self-pay

## 2019-01-15 DIAGNOSIS — Y9389 Activity, other specified: Secondary | ICD-10-CM | POA: Diagnosis not present

## 2019-01-15 DIAGNOSIS — Z79899 Other long term (current) drug therapy: Secondary | ICD-10-CM | POA: Insufficient documentation

## 2019-01-15 DIAGNOSIS — I10 Essential (primary) hypertension: Secondary | ICD-10-CM | POA: Insufficient documentation

## 2019-01-15 DIAGNOSIS — S161XXA Strain of muscle, fascia and tendon at neck level, initial encounter: Secondary | ICD-10-CM | POA: Diagnosis not present

## 2019-01-15 DIAGNOSIS — M25511 Pain in right shoulder: Secondary | ICD-10-CM | POA: Diagnosis not present

## 2019-01-15 DIAGNOSIS — S199XXA Unspecified injury of neck, initial encounter: Secondary | ICD-10-CM | POA: Diagnosis present

## 2019-01-15 DIAGNOSIS — Z87891 Personal history of nicotine dependence: Secondary | ICD-10-CM | POA: Insufficient documentation

## 2019-01-15 DIAGNOSIS — S4991XA Unspecified injury of right shoulder and upper arm, initial encounter: Secondary | ICD-10-CM | POA: Diagnosis not present

## 2019-01-15 DIAGNOSIS — M542 Cervicalgia: Secondary | ICD-10-CM | POA: Diagnosis not present

## 2019-01-15 DIAGNOSIS — Y92530 Ambulatory surgery center as the place of occurrence of the external cause: Secondary | ICD-10-CM | POA: Diagnosis not present

## 2019-01-15 DIAGNOSIS — Y999 Unspecified external cause status: Secondary | ICD-10-CM | POA: Diagnosis not present

## 2019-01-15 MED ORDER — METHOCARBAMOL 500 MG PO TABS: 1000.0000 mg | ORAL_TABLET | Freq: Four times a day (QID) | ORAL | 0 refills | Status: DC
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
## 2019-02-09 ENCOUNTER — Ambulatory Visit: Payer: Medicaid Other | Admitting: Family Medicine

## 2019-02-12 ENCOUNTER — Other Ambulatory Visit: Payer: Self-pay

## 2019-02-12 ENCOUNTER — Ambulatory Visit (INDEPENDENT_AMBULATORY_CARE_PROVIDER_SITE_OTHER): Payer: Medicaid Other | Admitting: Family Medicine

## 2019-02-12 DIAGNOSIS — M5441 Lumbago with sciatica, right side: Secondary | ICD-10-CM | POA: Diagnosis not present

## 2019-02-12 DIAGNOSIS — G8929 Other chronic pain: Secondary | ICD-10-CM | POA: Diagnosis not present

## 2019-02-12 DIAGNOSIS — M79644 Pain in right finger(s): Secondary | ICD-10-CM

## 2019-02-12 MED ORDER — NAPROXEN 500 MG PO TABS: 500.0000 mg | ORAL_TABLET | Freq: Two times a day (BID) | ORAL | 0 refills | Status: DC

## 2019-02-12 NOTE — Patient Instructions (Signed)
It was a pleasure seeing you today.   Today we discussed your back pain  For your back pain: I think you should follow up with neurosurgery   Please see  [facility] [place], [place], [place] [address] [phone]  Please follow up in 1 month after you see neurosurgery or sooner if symptoms persist or worsen. Please call the clinic immediately if you have any concerns.   Our clinic's number is [phone]. Please call with questions or concerns.   Please go to the emergency room if you have incontinence or further weakness   Thank you,  Caroline More, DO
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
## 2019-02-13 ENCOUNTER — Telehealth: Payer: Self-pay | Admitting: Family Medicine

## 2019-02-13 DIAGNOSIS — M79644 Pain in right finger(s): Secondary | ICD-10-CM | POA: Insufficient documentation

## 2019-02-13 HISTORY — DX: Pain in right finger(s): M79.644

## 2019-02-13 NOTE — Assessment & Plan Note (Signed)
Given constant sciatic pain with weakness patient will need further evaluation by neurosurgery. I recommended patient follow up with [facility] who she has seen in the past. Recommended NSAIDs for pain control and naproxen RX was refilled. Will refer to PT to help reduce pain with ambulation and help with strength. They can also evaluate need for assistive device such as cane or walker. Will refer to bariatric medicine as patient needs weight loss prior to hip surgery. Patient requested referral to pain specialist, I have placed this referral per her request. Advised to follow up in 1 month after seeing neurosurgery. Strict return precautions given.

## 2019-02-13 NOTE — Telephone Encounter (Signed)
Please inform patient I have referred her to PT. This can help with her pain with ambulation, weakness, and they can also evaluate for walker/cane need.   Dalphine Handing, PGY-3 [facility] 02/13/2019 9:29 PM
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
## 2019-02-15 ENCOUNTER — Telehealth: Payer: Self-pay

## 2019-02-15 NOTE — Telephone Encounter (Signed)
Patient informed of referral to PT.  Shirley [name], [place]

## 2019-02-17 ENCOUNTER — Ambulatory Visit: Payer: Medicaid Other | Admitting: Family Medicine

## 2019-02-23 ENCOUNTER — Ambulatory Visit: Payer: Medicaid Other | Admitting: Family Medicine

## 2019-02-23 ENCOUNTER — Encounter: Payer: Self-pay | Admitting: Family Medicine

## 2019-02-23 NOTE — Progress Notes (Signed)
Patient did not keep appointment today. She may call to reschedule.  

## 2019-02-25 ENCOUNTER — Ambulatory Visit (INDEPENDENT_AMBULATORY_CARE_PROVIDER_SITE_OTHER): Payer: Medicaid Other | Admitting: Family Medicine

## 2019-02-25 ENCOUNTER — Other Ambulatory Visit: Payer: Self-pay

## 2019-02-25 ENCOUNTER — Ambulatory Visit: Payer: Medicaid Other

## 2019-02-25 VITALS — BP 140/80 | HR 80 | Ht 66.0 in | Wt 324.0 lb

## 2019-02-25 DIAGNOSIS — M62838 Other muscle spasm: Secondary | ICD-10-CM | POA: Insufficient documentation

## 2019-02-25 DIAGNOSIS — M25512 Pain in left shoulder: Secondary | ICD-10-CM | POA: Diagnosis not present

## 2019-02-25 DIAGNOSIS — M25511 Pain in right shoulder: Secondary | ICD-10-CM

## 2019-02-25 DIAGNOSIS — M542 Cervicalgia: Secondary | ICD-10-CM | POA: Diagnosis not present

## 2019-02-25 HISTORY — DX: Pain in right shoulder: M25.511

## 2019-02-25 HISTORY — DX: Other muscle spasm: M62.838

## 2019-02-25 MED ORDER — NAPROXEN 500 MG PO TABS: 500.0000 mg | ORAL_TABLET | Freq: Two times a day (BID) | ORAL | 0 refills | Status: DC

## 2019-02-25 MED ORDER — METHOCARBAMOL 500 MG PO TABS: 1000.0000 mg | ORAL_TABLET | Freq: Three times a day (TID) | ORAL | 0 refills | Status: DC | PRN

## 2019-02-25 NOTE — Patient Instructions (Signed)
Thank you so much for coming in to see me today.   You shoulder and neck pain may be rotator cuff tendinoplasty with associated trapezius muscle spasm. I have ordered Robaxin and Naproxen to help with pain. I recommend heat, Voltaren gel, and Icey-hot patches as well. If you become interested in shoulder injection, please let us know.   Please schedule physical therapy as soon as able.  Return in 4-6 weeks if no improvement.  Take care, Dr. Tarry Kos

## 2019-02-25 NOTE — Assessment & Plan Note (Addendum)
Possible rotator cuff tendinothopathy however exam unable to distinguish any specific pathology. Discussed subaccromial ICS however patient opted against. She would like to try conservative therapy. - treatment as above
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
## 2019-03-10 DIAGNOSIS — Q6589 Other specified congenital deformities of hip: Secondary | ICD-10-CM | POA: Diagnosis not present

## 2019-03-10 DIAGNOSIS — M5441 Lumbago with sciatica, right side: Secondary | ICD-10-CM | POA: Diagnosis not present

## 2019-04-09 ENCOUNTER — Encounter: Payer: Medicaid Other | Admitting: Physical Medicine and Rehabilitation

## 2019-04-13 ENCOUNTER — Ambulatory Visit: Payer: Medicaid Other | Admitting: Physical Medicine & Rehabilitation

## 2019-04-13 ENCOUNTER — Encounter: Payer: Medicaid Other | Admitting: Physical Medicine and Rehabilitation

## 2019-04-14 ENCOUNTER — Encounter: Payer: Medicaid Other | Admitting: Physical Medicine and Rehabilitation

## 2019-04-15 ENCOUNTER — Ambulatory Visit: Payer: Medicaid Other | Admitting: Physical Medicine and Rehabilitation

## 2019-05-12 ENCOUNTER — Other Ambulatory Visit: Payer: Self-pay | Admitting: Family Medicine

## 2019-05-12 ENCOUNTER — Telehealth: Payer: Self-pay | Admitting: Family Medicine

## 2019-05-12 DIAGNOSIS — G8929 Other chronic pain: Secondary | ICD-10-CM

## 2019-05-12 NOTE — Telephone Encounter (Signed)
Pt is calling and her referral to [facility] is expired and a new one needs to be put in. Also she would like to get MRI on her back because of her sciatica.

## 2019-05-12 NOTE — Telephone Encounter (Signed)
Referral placed.  If she would like an MRI please schedule her for a visit. Can be in ATC to get her in sooner  Shirley More, DO, PGY-3 [facility] 05/12/2019 4:23 PM

## 2019-05-13 NOTE — Telephone Encounter (Signed)
Yes, if she wants to be seen sooner schedule in [place], DO, PGY-3 [facility] 05/13/2019 9:07 AM

## 2019-05-13 NOTE — Telephone Encounter (Signed)
Looks like pt is already scheduled to see you. Shirley [name], CMA

## 2019-05-25 NOTE — Progress Notes (Deleted)
   Subjective:    Patient ID: Shirley [name], female    DOB: 10/26/1990, 28 y.o.   MRN: [id number]   CC:  HPI:   Smoking status reviewed  Review of Systems   Objective:  There were no vitals taken for this visit. Vitals and nursing note reviewed  General: well nourished, in no acute distress HEENT: normocephalic, TM's visualized bilaterally, no scleral icterus or conjunctival pallor, no nasal discharge, moist mucous membranes, good dentition without erythema or discharge noted in posterior oropharynx Neck: supple, non-tender, without lymphadenopathy Cardiac: RRR, clear S1 and S2, no murmurs, rubs, or gallops Respiratory: clear to auscultation bilaterally, no increased work of breathing Abdomen: soft, nontender, nondistended, no masses or organomegaly. Bowel sounds present Extremities: no edema or cyanosis. Warm, well perfused. 2+ radial and PT pulses bilaterally Skin: warm and dry, no rashes noted Neuro: alert and oriented, no focal deficits   Assessment & Plan:    No problem-specific Assessment & Plan notes found for this encounter.    No follow-ups on file.   Caroline More, DO, PGY-3

## 2019-05-26 ENCOUNTER — Ambulatory Visit: Payer: Medicaid Other | Admitting: Family Medicine

## 2019-06-01 ENCOUNTER — Ambulatory Visit (HOSPITAL_COMMUNITY): Payer: Medicaid Other | Admitting: Physical Therapy

## 2019-06-07 ENCOUNTER — Other Ambulatory Visit: Payer: Self-pay

## 2019-06-07 ENCOUNTER — Encounter: Payer: Self-pay | Admitting: Physical Therapy

## 2019-06-07 ENCOUNTER — Ambulatory Visit: Payer: Medicaid Other | Attending: Family Medicine | Admitting: Physical Therapy

## 2019-06-07 DIAGNOSIS — M545 Low back pain, unspecified: Secondary | ICD-10-CM

## 2019-06-07 DIAGNOSIS — M6281 Muscle weakness (generalized): Secondary | ICD-10-CM | POA: Insufficient documentation

## 2019-06-07 DIAGNOSIS — G8929 Other chronic pain: Secondary | ICD-10-CM | POA: Insufficient documentation

## 2019-06-07 DIAGNOSIS — M25551 Pain in right hip: Secondary | ICD-10-CM | POA: Diagnosis present
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
## 2019-06-07 NOTE — Patient Instructions (Signed)
Access Code: [email or web address]  URL: [email or web address]  Date: 06/07/2019  Prepared by: Hilda Blades   Exercises Seated Cervical Sidebending AROM - 20 seconds hold - 1x daily Gentle Levator Scapulae Stretch - 2 reps - 20 seconds hold - 1x daily Standing Bilateral Low Shoulder Row with Anchored Resistance - 10 reps - 2 sets - 1x daily Supine Shoulder Horizontal Abduction with Resistance - 10 reps - 2 sets - 1x daily Beginner Clam - 10 reps - 2 sets - 1x daily Supine Lower Trunk Rotation - 10 reps - 5 seconds hold - 1x daily Sidelying Thoracic Lumbar Rotation - 10 reps - 5 seconds hold - 1x daily Prone Hip Extension on Table - 10 reps - 2 sets - 1x daily Sit to Stand with Hands on Knees - 5 reps - 2 sets - 1x daily

## 2019-06-14 ENCOUNTER — Telehealth: Payer: Self-pay | Admitting: Physical Therapy

## 2019-06-14 ENCOUNTER — Ambulatory Visit: Payer: Medicaid Other | Admitting: Physical Therapy

## 2019-06-14 NOTE — Telephone Encounter (Signed)
Pt was called after missing her PT appointment today at 3:00pm. I left message reminding the pt that she has another appointment scheduled on 06/22/2019 at 2:45pm with Hilda Blades, PT. Our clinic number was left on pt's voice mail if follow up is needed.   Kearney Hard, PT 06/14/19 3:34 PM

## 2019-06-15 ENCOUNTER — Ambulatory Visit: Payer: Medicaid Other | Admitting: Family Medicine

## 2019-06-22 ENCOUNTER — Ambulatory Visit: Payer: Medicaid Other | Admitting: Physical Therapy

## 2019-06-28 ENCOUNTER — Ambulatory Visit: Payer: Medicaid Other | Admitting: Physical Therapy

## 2019-07-15 ENCOUNTER — Ambulatory Visit: Payer: Medicaid Other | Admitting: Family Medicine

## 2019-07-16 ENCOUNTER — Ambulatory Visit: Payer: Medicaid Other | Admitting: Family Medicine

## 2019-07-16 ENCOUNTER — Encounter: Payer: Self-pay | Admitting: Family Medicine

## 2019-07-16 ENCOUNTER — Ambulatory Visit (INDEPENDENT_AMBULATORY_CARE_PROVIDER_SITE_OTHER): Payer: Medicaid Other | Admitting: Family Medicine

## 2019-07-16 ENCOUNTER — Other Ambulatory Visit: Payer: Self-pay

## 2019-07-16 VITALS — BP 142/96 | HR 59 | Wt 332.8 lb

## 2019-07-16 DIAGNOSIS — G8929 Other chronic pain: Secondary | ICD-10-CM | POA: Diagnosis not present

## 2019-07-16 DIAGNOSIS — M5441 Lumbago with sciatica, right side: Secondary | ICD-10-CM

## 2019-07-16 DIAGNOSIS — M542 Cervicalgia: Secondary | ICD-10-CM

## 2019-07-16 HISTORY — DX: Cervicalgia: M54.2

## 2019-07-16 MED ORDER — GABAPENTIN 100 MG PO CAPS: 100.0000 mg | ORAL_CAPSULE | Freq: Every evening | ORAL | 0 refills | Status: DC | PRN

## 2019-07-16 NOTE — Assessment & Plan Note (Addendum)
Chronic.  Does not appear to be worsening.  No new imaging needed at this time.  Patient will have neurosurgery please send this over to their records.  Patient states that she needs a walker, has been evaluated by PT.  I asked her to please tell them to inform us which type of walker that they recommend.  We will give course of gabapentin to see if this improves neuropathic pain.  We will try to avoid narcotic use if possible.  Given that patient has chronic pain I have referred her to pain management.  I think this will significantly help patient.  I think weight loss will also be important for this to improve as well.  Patient was able to ambulate in room and off the table without any difficulty which is reassuring as well.  Patient should continue to follow-up with physical therapy.  Strict return precautions given.  Follow-up if no improvement.
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
## 2019-07-16 NOTE — Patient Instructions (Signed)
It was a pleasure seeing you today.   Today we discussed your neck pain and back pain  For your pain: I have referred you to pain management. Please do not drive after taking gabapentin as it can make you sleepy. I have ordered an x-ray for your neck.  Please follow-up if no improvement.  Please follow up in 1 month if no improvement or sooner if symptoms persist or worsen. Please call the clinic immediately if you have any concerns.   Our clinic's number is [phone]. Please call with questions or concerns.   Thank you,  Oralia Manis, DO
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
## 2019-07-16 NOTE — Assessment & Plan Note (Signed)
Placed referral for weight management.  Encouraged weight loss.

## 2019-08-25 ENCOUNTER — Telehealth: Payer: Self-pay | Admitting: Family Medicine

## 2019-08-25 NOTE — Telephone Encounter (Signed)
Pt called because she waited too long to be seen. So can we put in new referrals for the following places.  Pain Management   [facility]  Neuro  Medical Weight loss

## 2019-08-30 ENCOUNTER — Ambulatory Visit (INDEPENDENT_AMBULATORY_CARE_PROVIDER_SITE_OTHER): Payer: Medicaid Other | Admitting: Family Medicine

## 2019-08-30 ENCOUNTER — Other Ambulatory Visit: Payer: Self-pay

## 2019-08-30 ENCOUNTER — Encounter: Payer: Self-pay | Admitting: Family Medicine

## 2019-08-30 VITALS — BP 135/75 | HR 68 | Temp 98.2°F | Wt 330.0 lb

## 2019-08-30 DIAGNOSIS — G8929 Other chronic pain: Secondary | ICD-10-CM | POA: Diagnosis not present

## 2019-08-30 DIAGNOSIS — M25512 Pain in left shoulder: Secondary | ICD-10-CM | POA: Diagnosis not present

## 2019-08-30 DIAGNOSIS — M542 Cervicalgia: Secondary | ICD-10-CM

## 2019-08-30 DIAGNOSIS — L700 Acne vulgaris: Secondary | ICD-10-CM

## 2019-08-30 DIAGNOSIS — M25511 Pain in right shoulder: Secondary | ICD-10-CM | POA: Diagnosis not present

## 2019-08-30 DIAGNOSIS — Q6589 Other specified congenital deformities of hip: Secondary | ICD-10-CM

## 2019-08-30 DIAGNOSIS — M48061 Spinal stenosis, lumbar region without neurogenic claudication: Secondary | ICD-10-CM

## 2019-08-30 DIAGNOSIS — M5416 Radiculopathy, lumbar region: Secondary | ICD-10-CM | POA: Diagnosis not present

## 2019-08-30 DIAGNOSIS — M5440 Lumbago with sciatica, unspecified side: Secondary | ICD-10-CM | POA: Diagnosis not present

## 2019-08-30 NOTE — Assessment & Plan Note (Signed)
Difficult to assess but can consider rotator cuff injury given positive empty can sign.  Follow-up with sports medicine as I do think that she may benefit from ultrasound imaging versus steroid injection.

## 2019-08-30 NOTE — Patient Instructions (Signed)

## 2019-08-30 NOTE — Assessment & Plan Note (Signed)
Weight is 330 pounds, was 302 pounds in July 2020. Advised to contact weight management to help set up an appointment.  Encourage healthy diet and daily exercise.  Hopefully once patient is able to lose some weight she will become a good surgical candidate as I do think orthopedic surgery for her hip dysplasia may help with some of her back pain.  Follow-up in 1 month.
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
## 2019-08-30 NOTE — Assessment & Plan Note (Signed)
Dermatology referral placed

## 2019-08-30 NOTE — Assessment & Plan Note (Signed)
Chronic.  Patient initially denied from chronic pain.  Second referral placed.  Patient desires to be reevaluated by neurosurgery.  Referral placed.  I agree with her that she should continue work with physical therapy.  Referral placed.  Patient encouraged for weight loss.  Will call weight loss center to schedule this appointment.  I have provided a DME order for cane to help with her ambulation.  Follow-up in 1 month, sooner if worsening.

## 2019-09-02 ENCOUNTER — Ambulatory Visit: Payer: Medicaid Other | Admitting: Physical Therapy

## 2019-09-03 ENCOUNTER — Encounter: Payer: Self-pay | Admitting: Physical Medicine and Rehabilitation

## 2019-09-03 ENCOUNTER — Telehealth: Payer: Self-pay

## 2019-09-03 NOTE — Telephone Encounter (Signed)
-----   Message from Oralia Manis, DO sent at 09/03/2019 12:06 PM EST ----- Hi Red team,  Dr. Pollie Meyer wanted me to ask if you could call this patient to inform her of an appointment with [facility] (for her pain management) @ March 22 at 1 pm , she will be evaluated by Dr.Krutika Raulkar  Thanks,  Sherin

## 2019-09-03 NOTE — Telephone Encounter (Signed)
Attempted to call patient with appointment for Pain Management.  No answer. LVM to call [facility] for appointment time.  When patient returns call, please inform her of the following:     [facility] (for her pain management) on 09/20/2019 at 1 pm , she will be evaluated by   Dr.Krutika Raulkar  [address] [place], [place] [address] [phone]  Thanks,  .Glennie Hawk, CMA

## 2019-09-14 ENCOUNTER — Telehealth: Payer: Self-pay | Admitting: Family Medicine

## 2019-09-14 ENCOUNTER — Other Ambulatory Visit: Payer: Self-pay | Admitting: Family Medicine

## 2019-09-14 DIAGNOSIS — M545 Low back pain, unspecified: Secondary | ICD-10-CM

## 2019-09-14 DIAGNOSIS — G8929 Other chronic pain: Secondary | ICD-10-CM

## 2019-09-14 NOTE — Telephone Encounter (Signed)
Please inform patient that I have placed a 2nd referral.   Orpah Clinton, PGY-3 [facility] 09/14/2019 4:43 PM

## 2019-09-14 NOTE — Telephone Encounter (Signed)
Pt is calling and would like to know if Dr. Darin Engels can send in a new pain management referral. She is not able to go to [facility] for Pain due to having multiple cancellations with their office.

## 2019-09-16 ENCOUNTER — Encounter: Payer: Self-pay | Admitting: Physical Therapy

## 2019-09-16 ENCOUNTER — Other Ambulatory Visit: Payer: Self-pay

## 2019-09-16 ENCOUNTER — Ambulatory Visit: Payer: Medicaid Other | Attending: Family Medicine | Admitting: Physical Therapy

## 2019-09-16 DIAGNOSIS — M545 Low back pain: Secondary | ICD-10-CM | POA: Diagnosis not present

## 2019-09-16 DIAGNOSIS — R2681 Unsteadiness on feet: Secondary | ICD-10-CM

## 2019-09-16 DIAGNOSIS — M6281 Muscle weakness (generalized): Secondary | ICD-10-CM | POA: Insufficient documentation

## 2019-09-16 DIAGNOSIS — M25551 Pain in right hip: Secondary | ICD-10-CM | POA: Diagnosis not present

## 2019-09-16 DIAGNOSIS — G8929 Other chronic pain: Secondary | ICD-10-CM | POA: Insufficient documentation

## 2019-09-16 NOTE — Telephone Encounter (Signed)
Called patient with new referral information.  [facility] [address] [place] [phone]  Dr. Emmaline Life  Patient verbalized understanding.  Glennie Hawk, CMA
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
## 2019-09-16 NOTE — Patient Instructions (Addendum)
Access Code: [email or web address]: [email or web address]: 03/18/2021Prepared by: Wayland Denis BeardsleyExercises  Sit to Stand without Arm Support - 3 x daily - 7 x weekly - 3 sets - 10 reps   Garen Lah, PT Certified Exercise Expert for the Aging Adult  09/16/19 4:34 PM Phone: [phone] Fax: [phone]

## 2019-09-17 ENCOUNTER — Ambulatory Visit: Payer: Medicaid Other | Admitting: Family Medicine

## 2019-09-20 ENCOUNTER — Encounter: Payer: Medicaid Other | Admitting: Physical Medicine and Rehabilitation

## 2019-09-21 DIAGNOSIS — L732 Hidradenitis suppurativa: Secondary | ICD-10-CM | POA: Diagnosis not present

## 2019-09-22 ENCOUNTER — Ambulatory Visit: Payer: Medicaid Other

## 2019-09-27 ENCOUNTER — Ambulatory Visit: Payer: Medicaid Other | Admitting: Family Medicine

## 2019-09-30 ENCOUNTER — Emergency Department (HOSPITAL_COMMUNITY): Payer: Medicaid Other

## 2019-09-30 ENCOUNTER — Encounter (HOSPITAL_COMMUNITY): Payer: Self-pay

## 2019-09-30 ENCOUNTER — Emergency Department (HOSPITAL_COMMUNITY)
Admission: EM | Admit: 2019-09-30 | Discharge: 2019-09-30 | Disposition: A | Discharge status: Home / Self Care | Payer: Medicaid Other | Attending: Emergency Medicine | Admitting: Emergency Medicine

## 2019-09-30 ENCOUNTER — Other Ambulatory Visit: Payer: Self-pay

## 2019-09-30 ENCOUNTER — Encounter (INDEPENDENT_AMBULATORY_CARE_PROVIDER_SITE_OTHER): Payer: Self-pay | Admitting: Family Medicine

## 2019-09-30 ENCOUNTER — Ambulatory Visit (INDEPENDENT_AMBULATORY_CARE_PROVIDER_SITE_OTHER): Payer: Medicaid Other | Admitting: Family Medicine

## 2019-09-30 VITALS — BP 170/140 | HR 73 | Temp 98.0°F | Ht 65.0 in | Wt 334.0 lb

## 2019-09-30 DIAGNOSIS — R748 Abnormal levels of other serum enzymes: Secondary | ICD-10-CM

## 2019-09-30 DIAGNOSIS — F419 Anxiety disorder, unspecified: Secondary | ICD-10-CM | POA: Diagnosis not present

## 2019-09-30 DIAGNOSIS — G894 Chronic pain syndrome: Secondary | ICD-10-CM

## 2019-09-30 DIAGNOSIS — R5383 Other fatigue: Secondary | ICD-10-CM

## 2019-09-30 DIAGNOSIS — I10 Essential (primary) hypertension: Secondary | ICD-10-CM

## 2019-09-30 DIAGNOSIS — F329 Major depressive disorder, single episode, unspecified: Secondary | ICD-10-CM | POA: Diagnosis not present

## 2019-09-30 DIAGNOSIS — Z6841 Body Mass Index (BMI) 40.0 and over, adult: Secondary | ICD-10-CM | POA: Diagnosis not present

## 2019-09-30 DIAGNOSIS — E786 Lipoprotein deficiency: Secondary | ICD-10-CM | POA: Diagnosis not present

## 2019-09-30 DIAGNOSIS — M79642 Pain in left hand: Secondary | ICD-10-CM | POA: Insufficient documentation

## 2019-09-30 DIAGNOSIS — F1290 Cannabis use, unspecified, uncomplicated: Secondary | ICD-10-CM | POA: Insufficient documentation

## 2019-09-30 DIAGNOSIS — Y999 Unspecified external cause status: Secondary | ICD-10-CM | POA: Insufficient documentation

## 2019-09-30 DIAGNOSIS — Z87891 Personal history of nicotine dependence: Secondary | ICD-10-CM | POA: Diagnosis not present

## 2019-09-30 DIAGNOSIS — E559 Vitamin D deficiency, unspecified: Secondary | ICD-10-CM

## 2019-09-30 DIAGNOSIS — Y929 Unspecified place or not applicable: Secondary | ICD-10-CM | POA: Insufficient documentation

## 2019-09-30 DIAGNOSIS — R0602 Shortness of breath: Secondary | ICD-10-CM

## 2019-09-30 DIAGNOSIS — Y939 Activity, unspecified: Secondary | ICD-10-CM | POA: Diagnosis not present

## 2019-09-30 DIAGNOSIS — G4719 Other hypersomnia: Secondary | ICD-10-CM | POA: Diagnosis not present

## 2019-09-30 DIAGNOSIS — S6992XA Unspecified injury of left wrist, hand and finger(s), initial encounter: Secondary | ICD-10-CM | POA: Diagnosis not present
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
## 2019-09-30 NOTE — Progress Notes (Signed)
Dear Dr. Deirdre [name],   Thank you for referring Shirley [name] to our clinic. The following note includes my evaluation and treatment recommendations.  Chief Complaint:   OBESITY Shirley [name] (MR# [id number]) is a 29 y.o. female who presents for evaluation and treatment of obesity and related comorbidities. Current BMI is Body mass index is 55.58 kg/m. Shirley [name] has been struggling with her weight for many years and has been unsuccessful in either losing weight, maintaining weight loss, or reaching her healthy weight goal.  Shirley [name] is currently in the action stage of change and ready to dedicate time achieving and maintaining a healthier weight. Shirley [name] is interested in becoming our patient and working on intensive lifestyle modifications including (but not limited to) diet and exercise for weight loss.  Shirley [name] says she usually eats 1-2 meals a day such as baked chicken, salad, or sandwiches.  She drinks sodas.  She gets poor sleep due to pain.  She stays at home with her 57-year-old, but does not lift.  Shirley [name] believed she was getting an evaluation for surgery today.  Shirley [name] habits were reviewed today and are as follows: Her family eats meals together, she thinks her family will eat healthier with her, her desired weight loss is 120 pounds, she has been heavy most of her life, she started gaining weight at 9-65 years old, her heaviest weight ever was her current weight, she craves tuna, shrimp, and scallops, she skips breakfast frequently, and sometimes lunch, she is frequently drinking liquids with calories, she frequently makes poor food choices and she struggles with emotional eating.  Depression Screen Shirley [name]'s Food and Mood (modified PHQ-9) score was 16.  Depression screen PHQ 2/9 09/30/2019  Decreased Interest 3  Down, Depressed, Hopeless 2  PHQ - 2 Score 5  Altered sleeping 3  Tired, decreased energy 3  Change in appetite 1  Feeling bad or failure about yourself  0  Trouble  concentrating 3  Moving slowly or fidgety/restless 1  Suicidal thoughts 0  PHQ-9 Score 16  Difficult doing work/chores Extremely dIfficult  Some recent data might be hidden   Subjective:   1. Other fatigue Caylei admits to daytime somnolence and reports waking up still tired. Patent has a history of symptoms of morning fatigue and snoring. Shirley [name] generally gets 3 or 4 hours of sleep per night, and states that she has poor sleep quality. Snoring is present. Apneic episodes are not present. Epworth Sleepiness Score is 16.  2. SOB (shortness of breath) on exertion Shirley [name] notes increasing shortness of breath with exercising and seems to be worsening over time with weight gain. She notes getting out of breath sooner with activity than she used to. This has not gotten worse recently. Shirley [name] denies shortness of breath at rest or orthopnea.  3. Essential hypertension Review: taking medications as instructed, no medication side effects noted, no chest pain on exertion, no dyspnea on exertion, no swelling of ankles.  She is not compliant with medication. Denies ETOH, drug use. Admits to lots of caffeine and stress.  BP Readings from Last 3 Encounters:  09/30/19 (!) 170/140  09/30/19 (!) 175/113  08/30/19 135/75   4. Chronic pain syndrome She says she was referred to Pain Management but missed her appointments.  5. Excessive daytime sleepiness Positive for snoring and morning headaches.    Situation Chance of Dozing or Sleeping  Sitting and reading 3 = high chance of dozing or sleeping  Watching television 3 = high chance of dozing  or sleeping  Sitting inactive in a public place (theater or meeting) 1 = slight chance of dozing or sleeping  Sitting as a passenger in a car for an hour 3 = high chance of dozing or sleeping  Lying down in the afternoon when circumstances permit 3 = high chance of dozing or sleeping  Sitting and talking to someone 1 = slight chance of dozing or sleeping  Sitting  quietly after lunch without alcohol 1 = slight chance of dozing or sleeping  In a car, while stopped for a few minutes in traffic 1 = slight chance of dozing or sleeping  TOTAL 16   6. Anxiety and depression Shirley [name] is struggling with emotional eating and using food for comfort to the extent that it is negatively impacting her health. She has been working on behavior modification techniques to help reduce her emotional eating and has been unsuccessful. She shows no sign of suicidal or homicidal ideations.  PHQ-9 is 16 today.  Assessment/Plan:   1. Other fatigue Shirley [name] does feel that her weight is causing her energy to be lower than it should be. Fatigue may be related to obesity, depression or many other causes. Labs will be ordered, and in the meanwhile, Shirley [name] will focus on self care including making healthy food choices, increasing physical activity and focusing on stress reduction.   Orders - EKG 12-Lead - Hemoglobin A1c - Lipid panel - VITAMIN D 25 Hydroxy (Vit-D Deficiency, Fractures) - TSH - T4, free - T3 - Anemia panel  2. SOB (shortness of breath) on exertion Shirley [name] notes increasing shortness of breath with exercising and seems to be worsening over time with weight gain. She notes getting out of breath sooner with activity than she used to. This has gotten worse recently. Shirley [name] denies shortness of breath at rest or orthopnea.  3. Essential hypertension Shirley [name] is working on healthy weight loss and exercise to improve blood pressure control. We will watch for signs of hypotension as she continues her lifestyle modifications.   Encouraged hydration, taking medication, and will refer to Cardiology with EKG.  Orders - Comprehensive metabolic panel - CBC with Differential/Platelet  4. Chronic pain syndrome Shirley [name] will contact her PCP for a new referral.  5. Excessive daytime sleepiness Intensive lifestyle modifications are the first line treatment for this issue. We discussed  several lifestyle modifications today and she will continue to work on diet, exercise and weight loss efforts.   Counseling  Limit or avoid alcohol, caffeinated beverages, and cigarettes, especially close to bedtime.   Do not eat a large meal or eat spicy foods right before bedtime. This can lead to digestive discomfort that can make it hard for you to sleep.  Keep a sleep diary to help you and your health care provider figure out what could be causing your insomnia.  . Make your bedroom a dark, comfortable place where it is easy to fall asleep. ? Put up shades or blackout curtains to block light from outside. ? Use a white noise machine to block noise. ? Keep the temperature cool. . Limit screen use before bedtime. This includes: ? Watching TV. ? Using your smartphone, tablet, or computer. . Stick to a routine that includes going to bed and waking up at the same times every day and night. This can help you fall asleep faster. Consider making a quiet activity, such as reading, part of your nighttime routine. . Try to avoid taking naps during the day so that you sleep better at  night. . Get out of bed if you are still awake after 15 minutes of trying to sleep. Keep the lights down, but try reading or doing a quiet activity. When you feel sleepy, go back to bed.  6. Anxiety and depression Behavior modification techniques were discussed today to help Shirley [name] deal with her emotional/non-hunger eating behaviors.  Orders and follow up as documented in patient record.   7. Class 3 severe obesity with serious comorbidity and body mass index (BMI) of 50.0 to 59.9 in adult, unspecified obesity type (HCC) Shirley [name] is currently in the action stage of change and her goal is to continue with weight loss efforts. I recommend Shirley [name] be referred to [facility].  She was informed of the importance of frequent follow-up visits to maximize her success with intensive lifestyle  modifications for her multiple health conditions. She was informed we would discuss her lab results at her next visit unless there is a critical issue that needs to be addressed sooner. Shirley [name] agreed to keep her next visit at the agreed upon time to discuss these results.  Objective:   Blood pressure (!) 170/140, pulse 73, temperature 98 F (36.7 C), temperature source Oral, height 5\' 5"  (1.651 m), weight (!) 334 lb (151.5 kg), last menstrual period 09/12/2019, SpO2 96 %, unknown if currently breastfeeding. Body mass index is 55.58 kg/m.  EKG: Normal sinus rhythm, rate 70 bpm.  Indirect Calorimeter completed today shows a VO2 of 312 and a REE of 2169.  Her calculated basal metabolic rate is 2170 thus her basal metabolic rate is worse than expected.  General: Cooperative, alert, well developed, in no acute distress. HEENT: Conjunctivae and lids unremarkable. Cardiovascular: Regular rhythm.  Lungs: Normal work of breathing. Neurologic: No focal deficits.   Lab Results  Component Value Date   CREATININE 0.56 10/30/2017   BUN 7 10/30/2017   NA 135 10/30/2017   K 3.3 (L) 10/30/2017   CL 106 10/30/2017   CO2 21 (L) 10/30/2017   Lab Results  Component Value Date   ALT 11 (L) 10/30/2017   AST 17 10/30/2017   ALKPHOS 143 (H) 10/30/2017   BILITOT 0.3 10/30/2017   Lab Results  Component Value Date   HGBA1C 5.5 09/29/2015   Lab Results  Component Value Date   CHOL 108 (L) 09/29/2015   HDL 35 (L) 09/29/2015   LDLCALC 61 09/29/2015   TRIG 61 09/29/2015   CHOLHDL 3.1 09/29/2015   Lab Results  Component Value Date   WBC 13.8 (H) 11/02/2017   HGB 10.0 (L) 11/02/2017   HCT 31.4 (L) 11/02/2017   MCV 82.0 11/02/2017   PLT 278 11/02/2017   Attestation Statements:   This is the patient's first visit at [facility]. The patient's NEW PATIENT PACKET was reviewed at length. Included in the packet: current and past health history, medications, allergies, ROS, gynecologic  history (women only), surgical history, family history, social history, weight history, weight loss surgery history (for those that have had weight loss surgery), nutritional evaluation, mood and food questionnaire, PHQ9, Epworth questionnaire, sleep habits questionnaire, patient life and health improvement goals questionnaire. These will all be scanned into the patient's chart under media.   During the visit, I independently reviewed the patient's EKG, bioimpedance scale results, and indirect calorimeter results. I used this information to tailor a meal plan for the patient that will help her to lose weight and will improve her obesity-related conditions going forward. I performed a medically necessary  appropriate examination and/or evaluation. I discussed the assessment and treatment plan with the patient. The patient was provided an opportunity to ask questions and all were answered. The patient agreed with the plan and demonstrated an understanding of the instructions. Labs were ordered at this visit and will be reviewed at the next visit unless more critical results need to be addressed immediately. Clinical information was updated and documented in the EMR.   Time spent on visit including pre-visit chart review and post-visit charting and care was 60 minutes.   I, Insurance claims handler, CMA, am acting as Energy manager for [employer], DO.  I have reviewed the above documentation for accuracy and completeness, and I agree with the above. Helane Rima, DO   Addendum:  Results for orders placed or performed in visit on 09/30/19  Comprehensive metabolic panel  Result Value Ref Range   Glucose 91 65 - 99 mg/dL   BUN 10 6 - 20 mg/dL   Creatinine, Ser 6.96 0.57 - 1.00 mg/dL   GFR calc non Af Amer 86 >59 mL/min/1.73   GFR calc Af Amer 99 >59 mL/min/1.73   BUN/Creatinine Ratio 11 9 - 23   Sodium 143 134 - 144 mmol/L   Potassium 3.7 3.5 - 5.2 mmol/L   Chloride 99 96 - 106 mmol/L   CO2 26 20 - 29  mmol/L   Calcium 9.0 8.7 - 10.2 mg/dL   Total Protein 7.5 6.0 - 8.5 g/dL   Albumin 3.7 (L) 3.9 - 5.0 g/dL   Globulin, Total 3.8 1.5 - 4.5 g/dL   Albumin/Globulin Ratio 1.0 (L) 1.2 - 2.2   Bilirubin Total <0.2 0.0 - 1.2 mg/dL   Alkaline Phosphatase 147 (H) 39 - 117 IU/L   AST 15 0 - 40 IU/L   ALT 8 0 - 32 IU/L  CBC with Differential/Platelet  Result Value Ref Range   WBC 8.6 3.4 - 10.8 x10E3/uL   RBC 4.61 3.77 - 5.28 x10E6/uL   Hemoglobin 12.3 11.1 - 15.9 g/dL   MCV 82 79 - 97 fL   MCH 26.7 26.6 - 33.0 pg   MCHC 32.5 31.5 - 35.7 g/dL   RDW 78.9 38.1 - 01.7 %   Platelets 302 150 - 450 x10E3/uL   Neutrophils 60 Not Estab. %   Lymphs 32 Not Estab. %   Monocytes 6 Not Estab. %   Eos 1 Not Estab. %   Basos 1 Not Estab. %   Neutrophils Absolute 5.2 1.4 - 7.0 x10E3/uL   Lymphocytes Absolute 2.8 0.7 - 3.1 x10E3/uL   Monocytes Absolute 0.5 0.1 - 0.9 x10E3/uL   EOS (ABSOLUTE) 0.1 0.0 - 0.4 x10E3/uL   Basophils Absolute 0.1 0.0 - 0.2 x10E3/uL   Immature Granulocytes 0 Not Estab. %   Immature Grans (Abs) 0.0 0.0 - 0.1 x10E3/uL  Hemoglobin A1c  Result Value Ref Range   Hgb A1c MFr Bld 5.6 4.8 - 5.6 %   Est. average glucose Bld gHb Est-mCnc 114 mg/dL  Lipid panel  Result Value Ref Range   Cholesterol, Total 105 100 - 199 mg/dL   Triglycerides 65 0 - 149 mg/dL   HDL 36 (L) >51 mg/dL   VLDL Cholesterol Cal 14 5 - 40 mg/dL   LDL Chol Calc (NIH) 55 0 - 99 mg/dL   Chol/HDL Ratio 2.9 0.0 - 4.4 ratio  VITAMIN D 25 Hydroxy (Vit-D Deficiency, Fractures)  Result Value Ref Range   Vit D, 25-Hydroxy 9.5 (L) 30.0 - 100.0 ng/mL  TSH  Result Value Ref Range   TSH 2.170 0.450 - 4.500 uIU/mL  T4, free  Result Value Ref Range   Free T4 1.22 0.82 - 1.77 ng/dL  T3  Result Value Ref Range   T3, Total 126 71 - 180 ng/dL  Anemia panel  Result Value Ref Range   Total Iron Binding Capacity 287 250 - 450 ug/dL   UIBC 251 131 - 425 ug/dL   Iron 36 27 - 159 ug/dL   Iron Saturation 13 (L) 15 - 55 %    Vitamin B-12 510 232 - 1,245 pg/mL   Folate, Hemolysate 263.0 Not Estab. ng/mL   Hematocrit 37.9 34.0 - 46.6 %   Folate, RBC 694 >498 ng/mL   Ferritin 62 15 - 150 ng/mL   Retic Ct Pct 1.8 0.6 - 2.6 %   Orders Placed This Encounter  Procedures  . Comprehensive metabolic panel  . CBC with Differential/Platelet  . Hemoglobin A1c  . Lipid panel  . VITAMIN D 25 Hydroxy (Vit-D Deficiency, Fractures)  . TSH  . T4, free  . T3  . Anemia panel  . Amb Referral to Bariatric Surgery  . Ambulatory referral to Cardiology  . EKG 12-Lead   1. Other fatigue   2. SOB (shortness of breath) on exertion   3. Essential hypertension   4. Chronic pain syndrome   5. Excessive daytime sleepiness   6. Anxiety and depression   7. Class 3 severe obesity with serious comorbidity and body mass index (BMI) of 50.0 to 59.9 in adult, unspecified obesity type ([place])   8. Elevated alkaline phosphatase level   9. Vitamin D deficiency   10. Low HDL (under 40)

## 2019-09-30 NOTE — ED Notes (Signed)
Patient verbalizes understanding of discharge instructions. Opportunity for questioning and answers were provided. Pt discharged from ED stable & ambulatory.   
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
## 2019-10-01 LAB — ANEMIA PANEL
Ferritin: 62 ng/mL (ref 15–150)
Folate, Hemolysate: 263 ng/mL
Folate, RBC: 694 ng/mL (ref >498)
Hematocrit: 37.9 % (ref 34.0–46.6)
Iron Saturation: 13 % — ABNORMAL LOW (ref 15–55)
Iron: 36 ug/dL (ref 27–159)
Retic Ct Pct: 1.8 % (ref 0.6–2.6)
Total Iron Binding Capacity: 287 ug/dL (ref 250–450)
UIBC: 251 ug/dL (ref 131–425)
Vitamin B-12: 510 pg/mL (ref 232–1245)

## 2019-10-01 LAB — COMPREHENSIVE METABOLIC PANEL
ALT: 8 IU/L (ref 0–32)
AST: 15 IU/L (ref 0–40)
Albumin/Globulin Ratio: 1 — ABNORMAL LOW (ref 1.2–2.2)
Albumin: 3.7 g/dL — ABNORMAL LOW (ref 3.9–5.0)
Alkaline Phosphatase: 147 IU/L — ABNORMAL HIGH (ref 39–117)
BUN/Creatinine Ratio: 11 (ref 9–23)
BUN: 10 mg/dL (ref 6–20)
Bilirubin Total: <0.2 mg/dL (ref 0.0–1.2)
CO2: 26 mmol/L (ref 20–29)
Calcium: 9 mg/dL (ref 8.7–10.2)
Chloride: 99 mmol/L (ref 96–106)
Creatinine, Ser: 0.9 mg/dL (ref 0.57–1.00)
GFR calc Af Amer: 99 mL/min/{1.73_m2} (ref >59)
GFR calc non Af Amer: 86 mL/min/{1.73_m2} (ref >59)
Globulin, Total: 3.8 g/dL (ref 1.5–4.5)
Glucose: 91 mg/dL (ref 65–99)
Potassium: 3.7 mmol/L (ref 3.5–5.2)
Sodium: 143 mmol/L (ref 134–144)
Total Protein: 7.5 g/dL (ref 6.0–8.5)

## 2019-10-01 LAB — HEMOGLOBIN A1C
Est. average glucose Bld gHb Est-mCnc: 114 mg/dL
Hgb A1c MFr Bld: 5.6 % (ref 4.8–5.6)

## 2019-10-01 LAB — CBC WITH DIFFERENTIAL/PLATELET
Basophils Absolute: 0.1 10*3/uL (ref 0.0–0.2)
Basos: 1 %
EOS (ABSOLUTE): 0.1 10*3/uL (ref 0.0–0.4)
Eos: 1 %
Hemoglobin: 12.3 g/dL (ref 11.1–15.9)
Immature Grans (Abs): 0 10*3/uL (ref 0.0–0.1)
Immature Granulocytes: 0 %
Lymphocytes Absolute: 2.8 10*3/uL (ref 0.7–3.1)
Lymphs: 32 %
MCH: 26.7 pg (ref 26.6–33.0)
MCHC: 32.5 g/dL (ref 31.5–35.7)
MCV: 82 fL (ref 79–97)
Monocytes Absolute: 0.5 10*3/uL (ref 0.1–0.9)
Monocytes: 6 %
Neutrophils Absolute: 5.2 10*3/uL (ref 1.4–7.0)
Neutrophils: 60 %
Platelets: 302 10*3/uL (ref 150–450)
RBC: 4.61 x10E6/uL (ref 3.77–5.28)
RDW: 14.6 % (ref 11.7–15.4)
WBC: 8.6 10*3/uL (ref 3.4–10.8)

## 2019-10-01 LAB — VITAMIN D 25 HYDROXY (VIT D DEFICIENCY, FRACTURES): Vit D, 25-Hydroxy: 9.5 ng/mL — ABNORMAL LOW (ref 30.0–100.0)

## 2019-10-01 LAB — LIPID PANEL
Chol/HDL Ratio: 2.9 ratio (ref 0.0–4.4)
Cholesterol, Total: 105 mg/dL (ref 100–199)
HDL: 36 mg/dL — ABNORMAL LOW (ref >39)
LDL Chol Calc (NIH): 55 mg/dL (ref 0–99)
Triglycerides: 65 mg/dL (ref 0–149)
VLDL Cholesterol Cal: 14 mg/dL (ref 5–40)

## 2019-10-01 LAB — T3: T3, Total: 126 ng/dL (ref 71–180)

## 2019-10-01 LAB — T4, FREE: Free T4: 1.22 ng/dL (ref 0.82–1.77)

## 2019-10-01 LAB — TSH: TSH: 2.17 u[IU]/mL (ref 0.450–4.500)

## 2019-10-05 ENCOUNTER — Encounter (INDEPENDENT_AMBULATORY_CARE_PROVIDER_SITE_OTHER): Payer: Self-pay | Admitting: Family Medicine

## 2019-10-05 DIAGNOSIS — E786 Lipoprotein deficiency: Secondary | ICD-10-CM | POA: Insufficient documentation

## 2019-10-05 DIAGNOSIS — E559 Vitamin D deficiency, unspecified: Secondary | ICD-10-CM | POA: Insufficient documentation

## 2019-10-05 DIAGNOSIS — F329 Major depressive disorder, single episode, unspecified: Secondary | ICD-10-CM | POA: Insufficient documentation

## 2019-10-05 DIAGNOSIS — F32A Depression, unspecified: Secondary | ICD-10-CM | POA: Insufficient documentation

## 2019-10-05 DIAGNOSIS — F419 Anxiety disorder, unspecified: Secondary | ICD-10-CM

## 2019-10-05 DIAGNOSIS — G4719 Other hypersomnia: Secondary | ICD-10-CM | POA: Insufficient documentation

## 2019-10-05 DIAGNOSIS — R748 Abnormal levels of other serum enzymes: Secondary | ICD-10-CM

## 2019-10-05 DIAGNOSIS — G894 Chronic pain syndrome: Secondary | ICD-10-CM | POA: Insufficient documentation

## 2019-10-05 HISTORY — DX: Lipoprotein deficiency: E78.6

## 2019-10-05 HISTORY — DX: Vitamin D deficiency, unspecified: E55.9

## 2019-10-05 HISTORY — DX: Abnormal levels of other serum enzymes: R74.8

## 2019-10-05 HISTORY — DX: Anxiety disorder, unspecified: F41.9

## 2019-10-05 HISTORY — DX: Depression, unspecified: F32.A

## 2019-10-06 ENCOUNTER — Encounter: Payer: Self-pay | Admitting: Family Medicine

## 2019-10-06 ENCOUNTER — Ambulatory Visit: Payer: Self-pay

## 2019-10-06 ENCOUNTER — Ambulatory Visit (INDEPENDENT_AMBULATORY_CARE_PROVIDER_SITE_OTHER): Payer: Medicaid Other | Admitting: Family Medicine

## 2019-10-06 ENCOUNTER — Ambulatory Visit: Payer: Medicaid Other | Attending: Family Medicine | Admitting: Physical Therapy

## 2019-10-06 ENCOUNTER — Other Ambulatory Visit: Payer: Self-pay

## 2019-10-06 ENCOUNTER — Encounter: Payer: Self-pay | Admitting: Physical Therapy

## 2019-10-06 VITALS — BP 157/115 | Ht 66.0 in | Wt 330.0 lb

## 2019-10-06 DIAGNOSIS — R2681 Unsteadiness on feet: Secondary | ICD-10-CM | POA: Insufficient documentation

## 2019-10-06 DIAGNOSIS — M545 Low back pain, unspecified: Secondary | ICD-10-CM

## 2019-10-06 DIAGNOSIS — M79642 Pain in left hand: Secondary | ICD-10-CM

## 2019-10-06 DIAGNOSIS — M25551 Pain in right hip: Secondary | ICD-10-CM | POA: Diagnosis not present

## 2019-10-06 DIAGNOSIS — M6281 Muscle weakness (generalized): Secondary | ICD-10-CM | POA: Insufficient documentation

## 2019-10-06 DIAGNOSIS — G8929 Other chronic pain: Secondary | ICD-10-CM | POA: Insufficient documentation

## 2019-10-06 NOTE — Therapy (Addendum)
Shirley [name], [place], [address] Phone: [phone]   Fax:  [phone]  Physical Therapy Treatment / Discharge  Patient Details  Name: Shirley [name] MRN: [id number] Date of Birth: 1991-01-15 Referring Provider (PT): Shirley Rio, MD   Encounter Date: 10/06/2019    Past Medical History:  Diagnosis Date  . Anxiety   . Arthritis   . Back pain   . Chest pain   . Constipation   . Depression   . Edema, lower extremity   . GERD (gastroesophageal reflux disease)   . Hip dysplasia   . Hypertension   . Joint pain   . Obesity   . Osteoarthritis   . Rheumatoid arthritis ([place])   . Sciatica   . Shortness of breath   . Snapping hip syndrome     Past Surgical History:  Procedure Laterality Date  . TOOTH EXTRACTION N/A 10/22/2013   Procedure: DENTAL EXTRACTIONS TEETH #1, 16, 17, 32;  Surgeon: Shirley [name], DDS;  Location: [facility];  Service: Oral Surgery;  Laterality: N/A;    There were no vitals filed for this visit.                              PT Short Term Goals - 10/06/19 1701      PT SHORT TERM GOAL #1   Title  Patient will be independent in HEP in order to maintain progress in PT.    Baseline  New exercises added, continues to require cueing for technique    Time  8    Period  Weeks    Status  On-going    Target Date  12/01/19      PT SHORT TERM GOAL #2   Title  Patient will report low back and right leg pain < or = 6/10 with walking in order to improve walking ability.    Baseline  8/10    Time  8    Period  Weeks    Status  On-going    Target Date  12/01/19      PT SHORT TERM GOAL #3   Title  Pt will improve 5 x STS to 14 sec or less to show improved LE strength and power (Normal 12 sec or less)    Baseline  26 seconds    Time  8    Period  Weeks    Status  On-going    Target Date  12/01/19      PT SHORT TERM GOAL #4   Title  Pt will be educated  on LRAD as needed for ascend/descent of stairs for fall prevention    Baseline  reports 3 falls on steps in the last month. currently ambulates with no AD, but may be able to strengthen with no device    Time  8    Period  Weeks    Status  On-going    Target Date  12/01/19        PT Long Term Goals - 10/06/19 1704      PT LONG TERM GOAL #1   Title  STG=LTG              Patient will benefit from skilled therapeutic intervention in order to improve the following deficits and impairments:  Abnormal gait, Difficulty walking, Obesity, Decreased range of motion, Decreased activity tolerance, Decreased strength, Impaired flexibility, Postural dysfunction, Pain  Visit Diagnosis: Chronic  right-sided low back pain, unspecified whether sciatica present - Plan: PT plan of care cert/re-cert  Pain in right hip - Plan: PT plan of care cert/re-cert  Muscle weakness (generalized) - Plan: PT plan of care cert/re-cert  Unsteadiness on feet - Plan: PT plan of care cert/re-cert     Problem List Patient Active Problem List   Diagnosis Date Noted  . Low HDL (under 40) 10/05/2019  . Vitamin D deficiency 10/05/2019  . Elevated alkaline phosphatase level 10/05/2019  . Chronic pain syndrome 10/05/2019  . Excessive daytime sleepiness 10/05/2019  . Anxiety and depression 10/05/2019  . Neck pain 07/16/2019  . Trapezius muscle spasm 02/25/2019  . Acute pain of both shoulders 02/25/2019  . Finger pain, right 02/13/2019  . Osteoarthritis of both knees 09/29/2015  . 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  . Low back pain with right-sided sciatica 09/15/2015  . Essential hypertension   . Class 3 severe obesity with serious comorbidity and body mass index (BMI) of 50.0 to 59.9 in adult ([place]) 12/02/2006  . Primary hypertension 08/28/2006  . [place] 08/28/2006    PHYSICAL THERAPY DISCHARGE SUMMARY  Visits from Start of Care: 2  Current functional level related to  goals / functional outcomes: See above   Remaining deficits: See above   Education / Equipment: HEP Plan: Patient agrees to discharge.  Patient goals were not met. Patient is being discharged due to not returning since the last visit.  ?????      Hilda Blades, PT, DPT, LAT, ATC 11/03/19  1:41 PM Phone: [phone] Fax: [facility] [address] [place], [place], [address] Phone: [phone]   Fax:  [phone]  Name: Shirley [name] MRN: [id number] Date of Birth: 04-27-1991
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
## 2019-10-06 NOTE — Telephone Encounter (Signed)
Please advise. Thanks.  

## 2019-10-06 NOTE — Patient Instructions (Signed)
Access Code: [email or web address] URL: [email or web address] Date: 10/06/2019 Prepared by: Rosana Hoes  Exercises Supine Short Arc Quad - 2 x daily - 7 x weekly - 2 sets - 10 reps Supine Active Straight Leg Raise - 2 x daily - 7 x weekly - 2 sets - 6 reps Supine Lower Trunk Rotation - 2 x daily - 7 x weekly - 10 reps - 5 seconds hold Supine March - 2 x daily - 7 x weekly - 2 sets - 10 reps Bridge - 2 x daily - 7 x weekly - 2 sets - 5 reps Sit to Stand with Hands on Knees - 2 x daily - 7 x weekly - 4 sets - 5 reps

## 2019-10-06 NOTE — Patient Instructions (Signed)
Your ultrasound is reassuring. You have soft tissue swelling, a bone contusion, and sprain of your wrist. Wear the brace regularly - ok to take off to ice the area, wash, sleep if it doesn't bother you too much when sleeping. Consider increase in your gabapentin and amitriptyline - it's possible the pain management physician will do this. Tylenol 500mg  1-2 tabs three times a day. NO MORE than aleve 2 tabs twice a day.  We can consider diclofenac, meloxicam instead. Icing 15 minutes at a time 3-4 times a day. Follow up with me in about 4 weeks but I can see you sooner for your neck/shoulder if you would like.

## 2019-10-11 DIAGNOSIS — Z1331 Encounter for screening for depression: Secondary | ICD-10-CM | POA: Diagnosis not present

## 2019-10-11 DIAGNOSIS — M545 Low back pain: Secondary | ICD-10-CM | POA: Diagnosis not present

## 2019-10-11 DIAGNOSIS — Z79891 Long term (current) use of opiate analgesic: Secondary | ICD-10-CM | POA: Diagnosis not present

## 2019-10-11 DIAGNOSIS — Z79899 Other long term (current) drug therapy: Secondary | ICD-10-CM | POA: Diagnosis not present

## 2019-10-11 DIAGNOSIS — Z1339 Encounter for screening examination for other mental health and behavioral disorders: Secondary | ICD-10-CM | POA: Diagnosis not present

## 2019-10-12 ENCOUNTER — Ambulatory Visit: Payer: Medicaid Other | Admitting: Physical Therapy

## 2019-10-13 ENCOUNTER — Ambulatory Visit: Payer: Medicaid Other | Admitting: Physical Therapy

## 2019-10-14 ENCOUNTER — Ambulatory Visit (INDEPENDENT_AMBULATORY_CARE_PROVIDER_SITE_OTHER): Payer: Self-pay | Admitting: Family Medicine

## 2019-10-18 ENCOUNTER — Ambulatory Visit: Payer: Medicaid Other | Admitting: Family Medicine

## 2019-10-24 NOTE — Progress Notes (Deleted)
    SUBJECTIVE:   CHIEF COMPLAINT / HPI:   *** Hypertension: - Medications: norvasc 10mg  - Compliance: *** - Checking BP at home: *** - Denies any SOB, CP, vision changes, LE edema, medication SEs, or symptoms of hypotension - Diet: *** - Exercise: ***   PERTINENT  PMH / PSH: ***  OBJECTIVE:   There were no vitals taken for this visit.  ***  ASSESSMENT/PLAN:   No problem-specific Assessment & Plan notes found for this encounter.     , DO [facility]

## 2019-10-25 ENCOUNTER — Ambulatory Visit: Payer: Medicaid Other | Admitting: Family Medicine

## 2019-10-26 ENCOUNTER — Ambulatory Visit: Payer: Medicaid Other | Admitting: Physical Therapy

## 2019-10-26 ENCOUNTER — Ambulatory Visit (INDEPENDENT_AMBULATORY_CARE_PROVIDER_SITE_OTHER): Payer: Medicaid Other | Admitting: Family Medicine

## 2019-10-27 ENCOUNTER — Ambulatory Visit: Payer: Medicaid Other | Admitting: Cardiology

## 2019-11-01 DIAGNOSIS — I10 Essential (primary) hypertension: Secondary | ICD-10-CM | POA: Diagnosis not present

## 2019-11-01 DIAGNOSIS — H538 Other visual disturbances: Secondary | ICD-10-CM | POA: Diagnosis not present

## 2019-11-05 DIAGNOSIS — H5213 Myopia, bilateral: Secondary | ICD-10-CM | POA: Diagnosis not present

## 2019-11-08 ENCOUNTER — Ambulatory Visit: Payer: Medicaid Other | Admitting: Family Medicine

## 2019-11-15 ENCOUNTER — Ambulatory Visit: Payer: Medicaid Other | Admitting: Family Medicine

## 2019-11-17 ENCOUNTER — Ambulatory Visit: Payer: Medicaid Other | Admitting: Family Medicine

## 2019-11-22 ENCOUNTER — Other Ambulatory Visit: Payer: Self-pay

## 2019-11-22 ENCOUNTER — Ambulatory Visit (INDEPENDENT_AMBULATORY_CARE_PROVIDER_SITE_OTHER): Payer: Medicaid Other | Admitting: Family Medicine

## 2019-11-22 ENCOUNTER — Encounter: Payer: Self-pay | Admitting: Family Medicine

## 2019-11-22 VITALS — BP 136/94 | HR 60 | Ht 66.0 in | Wt 337.0 lb

## 2019-11-22 DIAGNOSIS — I10 Essential (primary) hypertension: Secondary | ICD-10-CM | POA: Diagnosis not present

## 2019-11-22 DIAGNOSIS — M25562 Pain in left knee: Secondary | ICD-10-CM | POA: Diagnosis not present

## 2019-11-22 DIAGNOSIS — M25561 Pain in right knee: Secondary | ICD-10-CM

## 2019-11-22 DIAGNOSIS — Z748 Other problems related to care provider dependency: Secondary | ICD-10-CM | POA: Diagnosis not present

## 2019-11-22 DIAGNOSIS — M170 Bilateral primary osteoarthritis of knee: Secondary | ICD-10-CM | POA: Diagnosis not present

## 2019-11-22 HISTORY — DX: Other problems related to care provider dependency: Z74.8

## 2019-11-22 MED ORDER — AMLODIPINE BESYLATE 10 MG PO TABS: 10.0000 mg | ORAL_TABLET | Freq: Every day | ORAL | 3 refills | Status: DC

## 2019-11-22 NOTE — Progress Notes (Signed)
SUBJECTIVE:   CHIEF COMPLAINT / HPI:   Knee pain Patient was 5 min late to weight loss appointment due to trouble with transportation. Reports worsening knee pain x few years. Fell 8 months ago and reports worsening pain. Has had knee injections in the past without help. Patient is taking aleve without benefit. Is in "pain every day all day". Sees pain management, next appointment is tomorrow. He is planning on starting a new medication but is unsure the name. Sharp pain with movement. Feels stiff and swells daily. Locks and gives out on her.   Hypertension: - Medications: amlodipine 10mg   - Compliance: Has not taken BP medications in "a while"  - Checking BP at home: no - Denies any SOB, CP, vision changes, LE edema, medication SEs, or symptoms of hypotension - Diet: seeing weight loss clinic, trying to cut back on salt  - Exercise: stretching daily, PT  Elevated PHQ9 Patient with elevated PHQ-9 however she reports all of her symptoms are related to her pain.  Denies any symptoms of depression.  Would not like to talk to counseling.  Depression screen [facility] 2/9 11/22/2019 11/22/2019 09/30/2019 08/30/2019 02/25/2019  Decreased Interest 2 2 3 3  0  Down, Depressed, Hopeless 0 1 2 1  0  PHQ - 2 Score 2 3 5 4  0  Altered sleeping 2 - 3 - -  Tired, decreased energy 2 - 3 - -  Change in appetite 0 - 1 - -  Feeling bad or failure about yourself  0 - 0 - -  Trouble concentrating 1 - 3 - -  Moving slowly or fidgety/restless 0 - 1 - -  Suicidal thoughts 0 - 0 - -  PHQ-9 Score 7 - 16 - -  Difficult doing work/chores Very difficult - Extremely dIfficult - -  Some recent data might be hidden   Transportation issue Is having to depend on others for rides to appointments so has high no show rate. Now has a car and has contacted CSW for transportation assistance. Has a disability lawyer that is helping her with transportation as well.   PERTINENT  PMH / PSH: HTN, osteoarthritis of knees, obesity,  chronic pain, anxiety/depression  OBJECTIVE:   BP (!) 136/94   Pulse 60   Ht 5\' 6"  (1.676 m)   Wt (!) 337 lb (152.9 kg)   LMP 10/21/2019 (Approximate)   SpO2 99%   Breastfeeding No   BMI 54.39 kg/m   Gen: awake and alert, NAD Cardio: RRR, no MRG Resp: CTAB, no wheezes, rales, or rhonchi GI: soft, non tender, non distended, bowel sounds present Knee: - Inspection: no gross deformity. No swelling/effusion, erythema or bruising. Skin intact - Palpation: no TTP - ROM: full active ROM with flexion and extension in knee and hip - Strength: 5/5 strength - Neuro/vasc: NV intact - Special Tests: - LIGAMENTS: negative anterior and posterior drawer -- MENISCUS: Positive McMurray's -- PF JOINT: nml patellar mobility bilaterally.  negative patellar grind, negative patellar apprehension Hips: normal ROM, negative FABER and FADIR bilaterally   ASSESSMENT/PLAN:   Osteoarthritis of both knees Patient with chronic knee pain now worsening.  Patient does have a history of osteoarthritis of consider worsening of this.  Patient is also with 7 pound weight gain so this may be contributing as well.  Patient is seen at pain clinic so will not prescribe [name] pain medication at this time but advised to take ibuprofen as needed.  Physical therapy will likely alleviate some symptoms so  have referred patient to physical therapy.  Patient would like to see orthopedics to discuss surgery so have placed a referral for this.  Follow-up with [facility] in 1 month.  Essential hypertension Uncontrolled today.  Patient has been noncompliant on medications.  I discussed the importance of medication compliance and the risks of uncontrolled hypertension.  Patient reports she will start taking this.  Rx for refill sent to patient's desired pharmacy.  Assistance needed with transportation Patient with difficulty with transportation.  I offered CCM referral.  She refused at this time and states she has been working with Research officer, political party and now has transportation to her appointments.  Reports she will be [name] compliant with coming tomorrow follow-up appointments.   Elevated PHQ2 Related to her pain.  Patient denies any depression at this time.  No SI/HI.  Will follow up if desired.  Does not want counseling at this time.  Shirley [name], [place]

## 2019-11-22 NOTE — Assessment & Plan Note (Signed)
Patient with difficulty with transportation.  I offered CCM referral.  She refused at this time and states she has been working with Child psychotherapist and now has transportation to her appointments.  Reports she will be more compliant with coming tomorrow follow-up appointments.

## 2019-11-22 NOTE — Assessment & Plan Note (Signed)
Patient with chronic knee pain now worsening.  Patient does have a history of osteoarthritis of consider worsening of this.  Patient is also with 7 pound weight gain so this may be contributing as well.  Patient is seen at pain clinic so will not prescribe more pain medication at this time but advised to take ibuprofen as needed.  Physical therapy will likely alleviate some symptoms so have referred patient to physical therapy.  Patient would like to see orthopedics to discuss surgery so have placed a referral for this.  Follow-up with [facility] in 1 month.

## 2019-11-22 NOTE — Patient Instructions (Addendum)
For your knee pain 1. Please follow with orthopedics 2. Please follow with physical therapy  3. Please follow up with your pain management doctor for pain medicine 4. Continue ibuprofen as needed   For your blood pressure 1. Continue amlodipine, follow up in 1 week for a NURSING blood pressure check

## 2019-11-22 NOTE — Assessment & Plan Note (Signed)
Uncontrolled today.  Patient has been noncompliant on medications.  I discussed the importance of medication compliance and the risks of uncontrolled hypertension.  Patient reports she will start taking this.  Rx for refill sent to patient's desired pharmacy.

## 2019-12-01 ENCOUNTER — Encounter: Payer: Self-pay | Admitting: Family Medicine

## 2019-12-01 ENCOUNTER — Ambulatory Visit (INDEPENDENT_AMBULATORY_CARE_PROVIDER_SITE_OTHER): Payer: Medicaid Other | Admitting: Family Medicine

## 2019-12-01 ENCOUNTER — Other Ambulatory Visit: Payer: Self-pay

## 2019-12-01 DIAGNOSIS — M25561 Pain in right knee: Secondary | ICD-10-CM | POA: Diagnosis not present

## 2019-12-01 DIAGNOSIS — G8929 Other chronic pain: Secondary | ICD-10-CM | POA: Diagnosis not present

## 2019-12-01 DIAGNOSIS — M545 Low back pain, unspecified: Secondary | ICD-10-CM

## 2019-12-01 DIAGNOSIS — M25562 Pain in left knee: Secondary | ICD-10-CM

## 2019-12-01 NOTE — Progress Notes (Signed)
Office Visit Note   Patient: Shirley [name]           Date of Birth: 08-18-1990           MRN: [id number] Visit Date: 12/01/2019 Requested by: Doreene Eland, MD [address] [place],  [place] [address] PCP: Oralia Manis, DO  Subjective: Chief Complaint  Patient presents with  . Right Knee - Pain    Chronic pain since age 29 - had injury to the right knee, which she was never treated. Pain in both knees are "about the same."  . Left Knee - Pain  . Lower Back - Pain    Chronic low back pain. Currently in PT for this as well as for her knees. Pain radiates mainly down the right leg, but starting to do the same on the left.    HPI: Here with low back and bilateral knee pain.  Longstanding problems with her low back and her knees.  She had an MRI in 2019 of the lumbar spine showing L5-S1 small disc protrusion closely approximating the S1 nerve roots.  She has had an epidural injection which did not give significant relief.  She is going to physical therapy but that does not seem to be helping either.  She is on gabapentin and takes a lot of Aleve along with naproxen.  Her pain seems to be most pronounced in the tailbone area.  It is hard to stand up and walk.  Her knees have bothered her since childhood.  She has osteoarthritis right side greater than left based on most recent x-rays in 2017.  She currently weighs 338 pounds.  She is working with weight loss clinic to try to lose weight.  Her height is 5 foot 6 inches.               ROS: No bowel or bladder dysfunction.  All other systems were reviewed and are negative.  Objective: Vital Signs: There were no vitals taken for this visit.  Physical Exam:  General:  Alert and oriented, in no acute distress. Pulm:  Breathing unlabored. Psy:  Normal mood, congruent affect.  Low back: She is tender near the SI joints.  Straight leg raise negative.  No significant pain with internal hip rotation. Knees: 1+ effusion on the  right, trace on the left.  3+ patellofemoral crepitus bilaterally.    Imaging: No results found.  Assessment & Plan: 1.  Chronic low back pain with degenerative disc disease, question sacroiliac dysfunction. -Referral to Dr. Alvester Morin for SI joint injections. -Emphasized the importance of weight loss.  If she were to lose 100 pounds or more, he will make a huge difference in her back pain and her knee pain.  2.  Right greater than left knee osteoarthritis -Weight loss as above.  We will try Celebrex instead of naproxen.  Glucosamine sulfate.  Could inject with cortisone if symptoms worsen.     Procedures: No procedures performed  No notes on file     PMFS History: Patient Active Problem List   Diagnosis Date Noted  . Assistance needed with transportation 11/22/2019  . Low HDL (under 40) 10/05/2019  . Vitamin D deficiency 10/05/2019  . Elevated alkaline phosphatase level 10/05/2019  . Chronic pain syndrome 10/05/2019  . Excessive daytime sleepiness 10/05/2019  . Anxiety and depression 10/05/2019  . Neck pain 07/16/2019  . Trapezius muscle spasm 02/25/2019  . Acute pain of both shoulders 02/25/2019  . Finger pain, right 02/13/2019  .  Osteoarthritis of both knees 09/29/2015  . 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  . Low back pain with right-sided sciatica 09/15/2015  . Essential hypertension   . Class 3 severe obesity with serious comorbidity and body mass index (BMI) of 50.0 to 59.9 in adult ([place]) 12/02/2006  . Primary hypertension 08/28/2006  . [place] 08/28/2006   Past Medical History:  Diagnosis Date  . Anxiety   . Arthritis   . Back pain   . Chest pain   . Constipation   . Depression   . Edema, lower extremity   . GERD (gastroesophageal reflux disease)   . Hip dysplasia   . Hypertension   . Joint pain   . Obesity   . Osteoarthritis   . Rheumatoid arthritis ([place])   . Sciatica   . Shortness of breath   . Snapping hip syndrome       Family History  Problem Relation Age of Onset  . Hypertension Mother   . Depression Mother   . Anxiety disorder Mother   . Obesity Mother   . Diabetes Maternal Grandmother   . Diabetes Paternal Grandmother   . Stroke Paternal Grandmother   . Hypertension Father     Past Surgical History:  Procedure Laterality Date  . TOOTH EXTRACTION N/A 10/22/2013   Procedure: DENTAL EXTRACTIONS TEETH #1, 16, 17, 32;  Surgeon: Gae Bon, DDS;  Location: [facility];  Service: Oral Surgery;  Laterality: N/A;   Social History   Occupational History  . Occupation: stay at home mom  Tobacco Use  . Smoking status: Former Smoker    Packs/day: 0.50    Types: Cigarettes    Quit date: 01/29/2017    Years since quitting: 2.8  . Smokeless tobacco: Never Used  Substance and Sexual Activity  . Alcohol use: No    Alcohol/week: 0.0 standard drinks    Comment: rare  . Drug use: No    Types: Marijuana    Comment: daily usage  . Sexual activity: Yes    Birth control/protection: None

## 2019-12-02 ENCOUNTER — Ambulatory Visit: Payer: Medicaid Other | Admitting: Cardiology

## 2019-12-09 ENCOUNTER — Ambulatory Visit: Payer: Medicaid Other | Attending: Family Medicine | Admitting: Physical Therapy

## 2019-12-23 ENCOUNTER — Ambulatory Visit: Payer: Medicaid Other

## 2019-12-30 ENCOUNTER — Ambulatory Visit: Payer: Medicaid Other | Admitting: Physical Medicine and Rehabilitation

## 2020-01-06 ENCOUNTER — Ambulatory Visit: Payer: Medicaid Other | Admitting: Cardiology

## 2020-01-06 NOTE — Progress Notes (Deleted)
Cardiology Office Note:    Date:  01/06/2020   ID:  Shirley [name], DOB 1991/06/20, MRN [id number]  PCP:  Jackelyn Poling, DO  Cardiologist:  Jodelle Red, MD  Referring MD: Helane Rima, DO   No chief complaint on file.   History of Present Illness:    Shirley [name] is a 29 y.o. female with a hx of *** who is seen as a new consult at the request of Helane Rima, DO for the evaluation and management of hypertension, shortness of breath with exertion.  Note dated 09/30/19 with Dr. Earlene Plater at [facility] reviewed. Blood pressure very elevated at 170/140 at that visit. Also noted to have shortness of breath on exertion. ECG 09/30/19 NSR with nonspecific ST depressions. This is more pronounced from ECG 2017-07-14.  Past Medical History:  Diagnosis Date  . Anxiety   . Arthritis   . Back pain   . Chest pain   . Constipation   . Depression   . Edema, lower extremity   . GERD (gastroesophageal reflux disease)   . Hip dysplasia   . Hypertension   . Joint pain   . Obesity   . Osteoarthritis   . Rheumatoid arthritis (HCC)   . Sciatica   . Shortness of breath   . Snapping hip syndrome     Past Surgical History:  Procedure Laterality Date  . TOOTH EXTRACTION N/A 10/22/2013   Procedure: DENTAL EXTRACTIONS TEETH #1, 16, 17, 32;  Surgeon: Georgia Lopes, DDS;  Location: [facility] OR;  Service: Oral Surgery;  Laterality: N/A;    Current Medications: Current Outpatient Medications on File Prior to Visit  Medication Sig  . amitriptyline (ELAVIL) 10 MG tablet Take 1 tablet (10 mg total) by mouth at bedtime. (Patient not taking: Reported on 10/06/2019)  . amLODipine (NORVASC) 10 MG tablet Take 1 tablet (10 mg total) by mouth daily.  . bacitracin ointment Apply 1 application topically 2 (two) times daily. (Patient not taking: Reported on 10/06/2019)  . gabapentin (NEURONTIN) 100 MG capsule Take 1 capsule (100 mg total) by mouth at bedtime as needed (pain).  . naproxen  (NAPROSYN) 500 MG tablet Take 1 tablet (500 mg total) by mouth 2 (two) times daily.  . naproxen sodium (ALEVE) 220 MG tablet Take 440 mg by mouth daily as needed (pain).   No current facility-administered medications on file prior to visit.     Allergies:   Patient has no known allergies.   Social History   Tobacco Use  . Smoking status: Former Smoker    Packs/day: 0.50    Types: Cigarettes    Quit date: 01/29/2017    Years since quitting: 2.9  . Smokeless tobacco: Never Used  Substance Use Topics  . Alcohol use: No    Alcohol/week: 0.0 standard drinks    Comment: rare  . Drug use: No    Types: Marijuana    Comment: daily usage    Family History: family history includes Anxiety disorder in her mother; Depression in her mother; Diabetes in her maternal grandmother and paternal grandmother; Hypertension in her father and mother; Obesity in her mother; Stroke in her paternal grandmother.  ROS:   Please see the history of present illness.  Additional pertinent ROS: Constitutional: Negative for chills, fever, night sweats, unintentional weight loss  HENT: Negative for ear pain and hearing loss.   Eyes: Negative for loss of vision and eye pain.  Respiratory: Negative for cough, sputum, wheezing.   Cardiovascular: See  HPI. Gastrointestinal: Negative for abdominal pain, melena, and hematochezia.  Genitourinary: Negative for dysuria and hematuria.  Musculoskeletal: Negative for falls and myalgias.  Skin: Negative for itching and rash.  Neurological: Negative for focal weakness, focal sensory changes and loss of consciousness.  Endo/Heme/Allergies: Does not bruise/bleed easily.     EKGs/Labs/Other Studies Reviewed:    The following studies were reviewed today: ***  EKG:  EKG is personally reviewed.  The ekg ordered today demonstrates ***  Recent Labs: 09/30/2019: ALT 8; BUN 10; Creatinine, Ser 0.90; Hemoglobin 12.3; Platelets 302; Potassium 3.7; Sodium 143; TSH 2.170  Recent  Lipid Panel    Component Value Date/Time   CHOL 105 09/30/2019 1530   TRIG 65 09/30/2019 1530   HDL 36 (L) 09/30/2019 1530   CHOLHDL 2.9 09/30/2019 1530   CHOLHDL 3.1 09/29/2015 0957   VLDL 12 09/29/2015 0957   LDLCALC 55 09/30/2019 1530    Physical Exam:    VS:  There were no vitals taken for this visit.    Wt Readings from Last 3 Encounters:  11/22/19 (!) 337 lb (152.9 kg)  10/06/19 (!) 330 lb (149.7 kg)  09/30/19 (!) 334 lb (151.5 kg)    GEN: Well nourished, well developed in no acute distress HEENT: Normal, moist mucous membranes NECK: No JVD CARDIAC: regular rhythm, normal S1 and S2, no rubs or gallops. No murmurs. VASCULAR: Radial and DP pulses 2+ bilaterally. No carotid bruits RESPIRATORY:  Clear to auscultation without rales, wheezing or rhonchi  ABDOMEN: Soft, non-tender, non-distended MUSCULOSKELETAL:  Ambulates independently SKIN: Warm and dry, no edema NEUROLOGIC:  Alert and oriented x 3. No focal neuro deficits noted. PSYCHIATRIC:  Normal affect    ASSESSMENT:    No diagnosis found. PLAN:     Cardiac risk counseling and prevention recommendations: -recommend heart healthy/Mediterranean diet, with whole grains, fruits, vegetable, fish, lean meats, nuts, and olive oil. Limit salt. -recommend moderate walking, 3-5 times/week for 30-50 minutes each session. Aim for at least 150 minutes.week. Goal should be pace of 3 miles/hours, or walking 1.5 miles in 30 minutes -recommend avoidance of tobacco products. Avoid excess alcohol. -Additional risk factor control:  -Diabetes risk: A1c is  -Lipids:   -Blood pressure control:  -Weight:  -ASCVD risk score: The ASCVD Risk score Denman George DC Jr., et al., 2013) failed to calculate for the following reasons:   The 2013 ASCVD risk score is only valid for ages 99 to 45    Plan for follow up:  Jodelle Red, MD, PhD [place]  [facility]    Medication Adjustments/Labs and Tests Ordered: Current medicines  are reviewed at length with the patient today.  Concerns regarding medicines are outlined above.  No orders of the defined types were placed in this encounter.  No orders of the defined types were placed in this encounter.   There are no Patient Instructions on file for this visit.  Signed, Jodelle Red, MD PhD 01/06/2020 1:08 PM    [facility]

## 2020-01-07 DIAGNOSIS — H52223 Regular astigmatism, bilateral: Secondary | ICD-10-CM | POA: Diagnosis not present

## 2020-01-10 ENCOUNTER — Encounter: Payer: Self-pay | Admitting: General Practice

## 2020-01-17 ENCOUNTER — Telehealth: Payer: Self-pay | Admitting: Physical Medicine and Rehabilitation

## 2020-01-17 NOTE — Telephone Encounter (Signed)
Pt called wanting to reschedule her appt from 12/30/19.

## 2020-01-17 NOTE — Telephone Encounter (Signed)
Rescheduled

## 2020-01-20 DIAGNOSIS — Z79899 Other long term (current) drug therapy: Secondary | ICD-10-CM | POA: Diagnosis not present

## 2020-01-24 DIAGNOSIS — M546 Pain in thoracic spine: Secondary | ICD-10-CM | POA: Diagnosis not present

## 2020-01-24 DIAGNOSIS — M25561 Pain in right knee: Secondary | ICD-10-CM | POA: Diagnosis not present

## 2020-01-24 DIAGNOSIS — M25551 Pain in right hip: Secondary | ICD-10-CM | POA: Diagnosis not present

## 2020-01-24 DIAGNOSIS — M545 Low back pain: Secondary | ICD-10-CM | POA: Diagnosis not present

## 2020-01-24 DIAGNOSIS — M129 Arthropathy, unspecified: Secondary | ICD-10-CM | POA: Diagnosis not present

## 2020-01-24 DIAGNOSIS — G8929 Other chronic pain: Secondary | ICD-10-CM | POA: Diagnosis not present

## 2020-01-24 DIAGNOSIS — M542 Cervicalgia: Secondary | ICD-10-CM | POA: Diagnosis not present

## 2020-01-24 DIAGNOSIS — E559 Vitamin D deficiency, unspecified: Secondary | ICD-10-CM | POA: Diagnosis not present

## 2020-01-24 DIAGNOSIS — Z79899 Other long term (current) drug therapy: Secondary | ICD-10-CM | POA: Diagnosis not present

## 2020-01-24 DIAGNOSIS — Z1152 Encounter for screening for COVID-19: Secondary | ICD-10-CM | POA: Diagnosis not present

## 2020-01-31 DIAGNOSIS — M545 Low back pain: Secondary | ICD-10-CM | POA: Diagnosis not present

## 2020-01-31 DIAGNOSIS — E559 Vitamin D deficiency, unspecified: Secondary | ICD-10-CM | POA: Diagnosis not present

## 2020-02-02 ENCOUNTER — Ambulatory Visit: Payer: Medicaid Other | Admitting: Physical Medicine and Rehabilitation

## 2020-02-15 NOTE — Patient Instructions (Addendum)
It was great to see you!  Our plans for today:  -For your chronic back pain I am providing you a referral for neurosurgery.  I would like for you to request your records from pain management so we can see your recent x-rays. -For your knee pain I provide you a new referral for physical therapy to continue to work with you on this.  I would like for you to request records from pain management physician so we can see your recent x-rays -For your blood pressure I would like for you to restart your amlodipine at 10 mg/day. -I would like for you to make a follow-up appointment in the next 1 to 3 weeks to go over the more of your medical history.    Take care and seek immediate care sooner if you develop any concerns.   Dr. [facility]

## 2020-02-15 NOTE — Progress Notes (Signed)
SUBJECTIVE:   CHIEF COMPLAINT / HPI:   Back pain Started about 15 years ago, slowly getting worse. Previously was in pain management for back pain. Had a fall a few months ago which made her back pain worse. No xrays at that time. Shooting pain in right leg down to her toes which has been present for years. No saddle parasteshias, no loss of bowel or bladder function. States she was going to neurosurgery but needs a new referral. Sees pain management regularly, goes back in 1 week. States she got back and knee xrays at pain management about 4 weeks ago.  Knee pain: Patient is a 29 year old female that presents today to follow-up on her bilateral knee pain.  Per chart review knee pain occurred after a fall 10-12 months before.  Patient has had knee injections in the past without benefit.  Patient does have a history of osteoarthritis. Few months ago fell on steps after her leg locked up on her. States she has a history of torn meniscus. Requests a new cane as she had good benefit from this in the past. States she got back and knee xrays at pain management about 4 weeks ago.  Hypertension: Has not been taking amlodipine in months as she felt well and "had good blood pressures everywhere".  Patient normally prescribed amlodipine 10 mg/day.  Has not checked her blood pressure at home.   PERTINENT  PMH / PSH: Chronic pain, follows with pain management.  OBJECTIVE:   BP (!) 176/100   Pulse 62   Wt (!) 336 lb 3.2 oz (152.5 kg)   SpO2 99%   BMI 54.26 kg/m    164/100 recheck.  General: NAD, pleasant, able to participate in exam Cardiac: RRR, no murmurs. Respiratory: CTAB, normal effort, No wheezes, rales or rhonchi MSK: Right straight leg test positive, no midpoint/midline tenderness to palpation of the spine.  Generalized discomfort to the muscles lateral to the spine on both the left and right side, worse on the right with some hypertonicity noted..  Right knee with some tenderness to  palpation generally.  No varus or valgus laxity.  No pain to palpation of the patella.  Mild discomfort in the quadriceps tendon.  Strength 5/5 bilaterally in knee extensors.   ASSESSMENT/PLAN:   Osteoarthritis of both knees Assessment: Patient with chronic osteoarthritis of both knees.  Patient did have a fall several months ago which she states worsened her knee pain.  Patient did not receive x-rays at that time but has received x-rays since then at her pain clinic, we unfortunately do not have access to this via epic. Plan: -Patient to request her x-rays from her pain clinic as to avoid further radiation exposure with repeat x-rays -We will provide patient referral for physical therapy per her request -Continue following with pain clinic as needed  Spinal stenosis of lumbar region with radiculopathy Assessment: Patient with spinal stenosis history in the lumbar region with worsening radiculopathy over the past few months.  Patient states that she did have a fall onto her knees a few months ago and that her pain is slowly worsening.  Patient states that she previous followed with neurosurgery but due to COVID-19 pandemic was unable to continue following with them.  No saddle paresthesias, loss of bowel or bladder function.  No midline tenderness on palpation but some hypertonicity noted of the right and left musculature lateral to the lumbar region with right worse than left.  Mild discomfort noted to palpation of the  muscles lateral to the lumbar spine.  Patient recently had x-rays at her pain clinic, however we are unable to view these through epic.  Patient plans to request records for [place]. Plan: -We will provide patient referral to neurosurgery to determine if further procedure including injections are indicated -Patient to continue to follow with her pain clinic -Patient to request records from her pain clinic so we can see her recent x-rays.  Essential hypertension Assessment: Elevated  blood pressure today 170/100.  Patient endorses not taking her amlodipine for the past several months that she felt well and always had blood pressures that were within normal limits at previous places per her.  Pregnancy test negative so no concerns for preeclampsia.  Blood pressure 164/100 on recheck. Plan: -Recommend patient restart her amlodipine at 10 mg/day -Patient follow-up in the next 1-3 weeks for continued management of her overall health.   Provide DME order for cane.  Jackelyn Poling, DO [facility]

## 2020-02-16 ENCOUNTER — Ambulatory Visit (INDEPENDENT_AMBULATORY_CARE_PROVIDER_SITE_OTHER): Payer: Medicaid Other | Admitting: Family Medicine

## 2020-02-16 ENCOUNTER — Telehealth: Payer: Self-pay | Admitting: *Deleted

## 2020-02-16 ENCOUNTER — Other Ambulatory Visit: Payer: Self-pay

## 2020-02-16 VITALS — BP 176/100 | HR 62 | Wt 336.2 lb

## 2020-02-16 DIAGNOSIS — M48061 Spinal stenosis, lumbar region without neurogenic claudication: Secondary | ICD-10-CM

## 2020-02-16 DIAGNOSIS — M25561 Pain in right knee: Secondary | ICD-10-CM | POA: Diagnosis not present

## 2020-02-16 DIAGNOSIS — M170 Bilateral primary osteoarthritis of knee: Secondary | ICD-10-CM | POA: Diagnosis not present

## 2020-02-16 DIAGNOSIS — N912 Amenorrhea, unspecified: Secondary | ICD-10-CM

## 2020-02-16 DIAGNOSIS — G8929 Other chronic pain: Secondary | ICD-10-CM | POA: Diagnosis not present

## 2020-02-16 DIAGNOSIS — I10 Essential (primary) hypertension: Secondary | ICD-10-CM | POA: Diagnosis not present

## 2020-02-16 DIAGNOSIS — M5416 Radiculopathy, lumbar region: Secondary | ICD-10-CM

## 2020-02-16 LAB — POCT URINE PREGNANCY: Preg Test, Ur: NEGATIVE

## 2020-02-16 NOTE — Assessment & Plan Note (Signed)
Assessment: Patient with spinal stenosis history in the lumbar region with worsening radiculopathy over the past few months.  Patient states that she did have a fall onto her knees a few months ago and that her pain is slowly worsening.  Patient states that she previous followed with neurosurgery but due to COVID-19 pandemic was unable to continue following with them.  No saddle paresthesias, loss of bowel or bladder function.  No midline tenderness on palpation but some hypertonicity noted of the right and left musculature lateral to the lumbar region with right worse than left.  Mild discomfort noted to palpation of the muscles lateral to the lumbar spine.  Patient recently had x-rays at her pain clinic, however we are unable to view these through epic.  Patient plans to request records for [place]. Plan: -We will provide patient referral to neurosurgery to determine if further procedure including injections are indicated -Patient to continue to follow with her pain clinic -Patient to request records from her pain clinic so we can see her recent x-rays.

## 2020-02-16 NOTE — Telephone Encounter (Signed)
[facility] received and will process now. Jone Baseman, CMA

## 2020-02-16 NOTE — Assessment & Plan Note (Addendum)
Assessment: Elevated blood pressure today 170/100.  Patient endorses not taking her amlodipine for the past several months that she felt well and always had blood pressures that were within normal limits at previous places per her.  Pregnancy test negative so no concerns for preeclampsia.  Blood pressure 164/100 on recheck. Plan: -Recommend patient restart her amlodipine at 10 mg/day -Patient follow-up in the next 1-3 weeks for continued management of her overall health.

## 2020-02-16 NOTE — Telephone Encounter (Signed)
Community message sent to Henderson Newcomer, Charlton Amor and Shane Crutch @ [facility] to process DME order for [facility].   Jone Baseman, CMA

## 2020-02-16 NOTE — Assessment & Plan Note (Signed)
Assessment: Patient with chronic osteoarthritis of both knees.  Patient did have a fall several months ago which she states worsened her knee pain.  Patient did not receive x-rays at that time but has received x-rays since then at her pain clinic, we unfortunately do not have access to this via epic. Plan: -Patient to request her x-rays from her pain clinic as to avoid further radiation exposure with repeat x-rays -We will provide patient referral for physical therapy per her request -Continue following with pain clinic as needed

## 2020-03-02 DIAGNOSIS — M545 Low back pain: Secondary | ICD-10-CM | POA: Diagnosis not present

## 2020-03-02 DIAGNOSIS — E559 Vitamin D deficiency, unspecified: Secondary | ICD-10-CM | POA: Diagnosis not present

## 2020-03-02 DIAGNOSIS — M546 Pain in thoracic spine: Secondary | ICD-10-CM | POA: Diagnosis not present

## 2020-03-02 DIAGNOSIS — Z79899 Other long term (current) drug therapy: Secondary | ICD-10-CM | POA: Diagnosis not present

## 2020-03-14 DIAGNOSIS — M25561 Pain in right knee: Secondary | ICD-10-CM | POA: Diagnosis not present

## 2020-04-04 DIAGNOSIS — Z79899 Other long term (current) drug therapy: Secondary | ICD-10-CM | POA: Diagnosis not present

## 2020-04-10 ENCOUNTER — Ambulatory Visit: Payer: Medicaid Other

## 2020-04-10 NOTE — Telephone Encounter (Signed)
Patient calls nurse line stating she still has not received DME cane. I have reached out to [facility] and will await their response.

## 2020-04-13 ENCOUNTER — Telehealth: Payer: Self-pay

## 2020-04-13 NOTE — Telephone Encounter (Signed)
Patient calls nurse line requesting to be referred elsewhere for neurosurgery. Patient reports she missed too many apts at [facility], therefore they will no longer accept her. Will forward to referral coordinator.

## 2020-04-13 NOTE — Telephone Encounter (Signed)
Spoke with patient let her know that I would have to send her out of [place] for neurosurgery.  Patient states that she has issues with transportation and wouldn't be able to do that.  She has no showed multiple appointments over the last year with [facility] and was dismissed.  Patient made an appointment to see provider for next steps (possible mri, neurology referral).  Deeksha Cotrell,CMA

## 2020-04-13 NOTE — Telephone Encounter (Signed)
Per Charlton Amor at [facility], this was an error on their part. They will have the cane shipped as soon possible to the patient.

## 2020-04-19 DIAGNOSIS — Z79891 Long term (current) use of opiate analgesic: Secondary | ICD-10-CM | POA: Diagnosis not present

## 2020-04-19 DIAGNOSIS — M545 Low back pain, unspecified: Secondary | ICD-10-CM | POA: Diagnosis not present

## 2020-04-20 NOTE — Progress Notes (Deleted)
    SUBJECTIVE:   CHIEF COMPLAINT / HPI:   Back pain: Patient is a 29 year old female that presents today for follow-up of her chronic back pain.  Patient previously had a referral for neurosurgery in [place] but per documentation no showed on multiple instances and was dismissed.***.  Patient states that since then her back pain***.  PERTINENT  PMH / PSH: Chronic back pain  OBJECTIVE:   There were no vitals taken for this visit.   General: NAD, pleasant, able to participate in exam Cardiac: RRR, no murmurs. Respiratory: CTAB, normal effort, No wheezes, rales or rhonchi Abdomen: Bowel sounds present, nontender, nondistended, no hepatosplenomegaly. Extremities: no edema or cyanosis. Skin: warm and dry, no rashes noted Neuro: alert, no obvious focal deficits Psych: Normal affect and mood  ASSESSMENT/PLAN:   No problem-specific Assessment & Plan notes found for this encounter.   Pap smear?,  Hep C screen  Jackelyn Poling, DO [facility]    This note was prepared using Dragon voice recognition software and may include unintentional dictation errors due to the inherent limitations of voice recognition software.

## 2020-04-24 ENCOUNTER — Ambulatory Visit: Payer: Medicaid Other | Admitting: Family Medicine

## 2020-05-11 DIAGNOSIS — L732 Hidradenitis suppurativa: Secondary | ICD-10-CM | POA: Diagnosis not present

## 2020-05-11 DIAGNOSIS — M545 Low back pain, unspecified: Secondary | ICD-10-CM | POA: Diagnosis not present

## 2020-05-11 DIAGNOSIS — Z79899 Other long term (current) drug therapy: Secondary | ICD-10-CM | POA: Diagnosis not present

## 2020-06-02 ENCOUNTER — Ambulatory Visit: Payer: Medicaid Other | Admitting: Family Medicine

## 2020-06-05 NOTE — Progress Notes (Deleted)
    SUBJECTIVE:   CHIEF COMPLAINT / HPI:   Low back pain with spinal stenosis and radiculopathy chronic: Patient is a pleasant 29 year old female presents today for follow-up for low back pain.  Previously the patient was referred to neurosurgery but as she had been dismissed by one of the local neurosurgery group for no-shows was set up to be established with one of the groups that is out of town.  Unfortunately it appears the patient was not able to do this due to transportation issues.  The patient has a history of spinal stenosis with radiculopathy and is followed by pain clinic.  Patient denies any saddle paresthesias, loss of bowel or bladder function***.  Patient previously was suggested to request her records from her pain clinic so we can see her recent x-rays to avoid exposing her for further radiation to repeat these.  Today she states***  Hypertension: ***  Current smoker?***  PERTINENT  PMH / PSH: ***  OBJECTIVE:   There were no vitals taken for this visit.   General: NAD, pleasant, able to participate in exam Cardiac: RRR, no murmurs. Respiratory: CTAB, normal effort, No wheezes, rales or rhonchi Abdomen: Bowel sounds present, nontender, nondistended, no hepatosplenomegaly. Extremities: no edema or cyanosis. Skin: warm and dry, no rashes noted Neuro: alert, no obvious focal deficits Psych: Normal affect and mood  ASSESSMENT/PLAN:   No problem-specific Assessment & Plan notes found for this encounter.   Hep C screening*** Pap smear***  Jackelyn Poling, DO [facility]    This note was prepared using Dragon voice recognition software and may include unintentional dictation errors due to the inherent limitations of voice recognition software.

## 2020-06-07 ENCOUNTER — Ambulatory Visit: Payer: Medicaid Other | Admitting: Family Medicine

## 2020-06-12 ENCOUNTER — Encounter (INDEPENDENT_AMBULATORY_CARE_PROVIDER_SITE_OTHER): Payer: Self-pay | Admitting: Family Medicine

## 2020-06-14 NOTE — Progress Notes (Signed)
SUBJECTIVE:   CHIEF COMPLAINT / HPI:   Low back pain with spinal stenosis and radiculopathy chronic: Patient is a pleasant 29 year old female presents today for follow-up for low back pain.  Previously the patient was referred to neurosurgery but as she had been dismissed by one of the local neurosurgery group for no-shows was set up to be established with one of the groups that is out of town.  Unfortunately it appears the patient was not able to do this due to transportation issues.  The patient has a history of spinal stenosis with radiculopathy and is followed by pain clinic.  Patient denies any saddle paresthesias, loss of bowel or bladder function.  Today she states she continues to have right sided shooting pain which is similar to what she has dealt with for the past several years.  She denies any new injury to the area. She continues in pain management. She states that she previously was discharged from neurosurgery for no-shows but does want to go to them and has better transportation at this time. I explained her that she may have to go to [place] or [place] in order to get a neurosurgeon and she is okay with this. She also requested a referral to physical therapy as she previously went to them and had some benefit.  Hypertension: Patient is a 29 y.o. female who present today for follow up of hypertension.   Patient endorses difficulties with medication compliance. She states she is only taking this on rare occasion.  Home medications include: Norvasc 10 mg/day  Most recent creatinine trend:  Lab Results  Component Value Date   CREATININE 0.90 09/30/2019   CREATININE 0.56 10/30/2017   CREATININE 0.70 10/27/2017   Patient does not check blood pressure at home.  Patient has had a BMP in the past 1 year.   PERTINENT  PMH / PSH: History of spinal stenosis  OBJECTIVE:   Ht 5\' 6"  (1.676 m)   Wt (!) 312 lb 2 oz (141.6 kg)   LMP 06/13/2020   BMI 50.38 kg/m     General: NAD, pleasant, able to participate in exam Cardiac: RRR, no murmurs. Respiratory: CTAB, normal effort Abdomen: Bowel sounds present, nontender, nondistended, no hepatosplenomegaly. MSK: No midline discomfort on palpation of the spine, patient does have some discomfort on palpation just lateral to the spine on the right side of her low back as well as her upper gluteal region.  Patient does have a positive straight leg raise test on the right, negative on the left.  Strength testing of lower limbs has knee extension and knee flexion 5/5 bilaterally with hip flexion on the right 4 out of 5 due to pain and 5 out of 5 on the left Neuro: alert, no obvious focal deficits Psych: Normal affect and mood  ASSESSMENT/PLAN:   Essential hypertension Assessment: 29 year old female with history of hypertension on amlodipine 10 mg/day who endorses some issues with compliance. Blood pressure elevated today of 144/90 in our office today.  I discussed with patient the importance of taking her blood pressure medicine on a regular basis especially that she may need a neurosurgical procedure in the near future.  Patient requests refills for her amlodipine and states that she plans to take this on a regular basis. Plan: -Refills provided -Discussion as above -Continue amlodipine 10 mg/day.  Spinal stenosis of lumbar region with radiculopathy Assessment: 29 year old patient with long-term history of spinal stenosis of lumbar region with right-sided radiculopathy due to this.  She  had previously been referred to neurosurgery but due to transportation issues was not able to make her appointment was subsequently discharged from the practice.  Patient states that she has had no improvement from various modalities including NSAIDs and she is currently in pain therapy right now for her chronic pain.  Physical exam with no midline tenderness but patient does have a positive straight leg test on the right.  Strength  mildly reduced on right hip flexion due to pain, strength 5/5 in knee flexion and extension on both the left and right.  No red flag symptoms. Patient request a referral to neurosurgery again as well as another referral to physical therapy as she did get some minor improvement from doing some stretching and strengthening work.  I explained to the patient the importance of showing up for her neurosurgery appointment and that she would likely need 1 from a to the [facility] area due to being discharged from the practice strengthening work. Plan: -Neurosurgery referral as above -Provide patient a physical therapy referral as she did get some benefit from this before, per her request -Discussed return precautions -Patient plans to follow-up for disability paperwork in about 1 week as we did not have time to complete this today.   -Patient plans make a follow-up appointment for her Pap smear.  Jackelyn Poling, DO [facility]    This note was prepared using Dragon voice recognition software and may include unintentional dictation errors due to the inherent limitations of voice recognition software.

## 2020-06-14 NOTE — Patient Instructions (Signed)
It was great to see you! Thank you for allowing me to participate in your care!  Our plans for today:  -Today we placed a referral to neurosurgery in either the [place] or [place] area.  Once you get a call from them to schedule the appointment it is very important that you do show up for it.  I think they are the ultimate fix for your low back pain and sciatica. -Also placed a referral for physical therapy to try to help with some strengthening and stretching but I think ultimately neurosurgery is the answer -If neurosurgery is not able to meet with you we can also consider an orthopedic group to evaluate for epidural injections but they are not able to do surgical procedures that may help with your back as that is in the neurosurgery realm -I do recommend that you take your amlodipine on a regular basis as this will help keep your blood pressure in a good range and help improve your health overall. -If you develop any numbness in your crotch-region, any loss of bowel or bladder function please go to the emergency department immediately  Take care and seek immediate care sooner if you develop any concerns.   Dr. Jackelyn Poling, DO [facility]

## 2020-06-15 ENCOUNTER — Encounter: Payer: Self-pay | Admitting: Family Medicine

## 2020-06-15 ENCOUNTER — Ambulatory Visit (INDEPENDENT_AMBULATORY_CARE_PROVIDER_SITE_OTHER): Payer: Medicaid Other | Admitting: Family Medicine

## 2020-06-15 ENCOUNTER — Other Ambulatory Visit: Payer: Self-pay

## 2020-06-15 ENCOUNTER — Ambulatory Visit: Payer: Medicaid Other | Admitting: Family Medicine

## 2020-06-15 VITALS — Ht 66.0 in | Wt 312.1 lb

## 2020-06-15 DIAGNOSIS — M5416 Radiculopathy, lumbar region: Secondary | ICD-10-CM

## 2020-06-15 DIAGNOSIS — I10 Essential (primary) hypertension: Secondary | ICD-10-CM

## 2020-06-15 DIAGNOSIS — M48061 Spinal stenosis, lumbar region without neurogenic claudication: Secondary | ICD-10-CM

## 2020-06-15 MED ORDER — AMLODIPINE BESYLATE 10 MG PO TABS: 10.0000 mg | ORAL_TABLET | Freq: Every day | ORAL | 3 refills | Status: DC

## 2020-06-15 NOTE — Assessment & Plan Note (Signed)
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  She had previously been referred to neurosurgery but due to transportation issues was not able to make her appointment was subsequently discharged from the practice.  Patient states that she has had no improvement from various modalities including NSAIDs and she is currently in pain therapy right now for her chronic pain.  Physical exam with no midline tenderness but patient does have a positive straight leg test on the right.  Strength mildly reduced on right hip flexion due to pain, strength 5/5 in knee flexion and extension on both the left and right.  No red flag symptoms. Patient request a referral to neurosurgery again as well as another referral to physical therapy as she did get some minor improvement from doing some stretching and strengthening work.  I explained to the patient the importance of showing up for her neurosurgery appointment and that she would likely need 1 from a to the [facility] area due to being discharged from the practice strengthening work. Plan: -Neurosurgery referral as above -Provide patient a physical therapy referral as she did get some benefit from this before, per her request -Discussed return precautions -Patient plans to follow-up for disability paperwork in about 1 week as we did not have time to complete this today.

## 2020-06-15 NOTE — Assessment & Plan Note (Signed)
Assessment: 29 year old female with history of hypertension on amlodipine 10 mg/day who endorses some issues with compliance. Blood pressure elevated today of 144/90 in our office today.  I discussed with patient the importance of taking her blood pressure medicine on a regular basis especially that she may need a neurosurgical procedure in the near future.  Patient requests refills for her amlodipine and states that she plans to take this on a regular basis. Plan: -Refills provided -Discussion as above -Continue amlodipine 10 mg/day.

## 2020-06-19 DIAGNOSIS — Z79899 Other long term (current) drug therapy: Secondary | ICD-10-CM | POA: Diagnosis not present

## 2020-06-19 DIAGNOSIS — M545 Low back pain, unspecified: Secondary | ICD-10-CM | POA: Diagnosis not present

## 2020-06-29 ENCOUNTER — Telehealth: Payer: Self-pay | Admitting: Family Medicine

## 2020-06-29 ENCOUNTER — Ambulatory Visit: Payer: Medicaid Other | Admitting: Family Medicine

## 2020-06-29 NOTE — Telephone Encounter (Signed)
Patient missed appointment today (06/29/2020) at 3:10 Called to reschedule I offered her Dr. Vergie Living next available and she denied because it is too far out. She said she is needing paperwork filled out I offered her someone else on team and she stated that she wanted Dr. Atha Starks to call her so she could talk to him to see if he could see her sooner and just can't saying this isn't no WE I need HIM to call me. I told patient that I would send a message back. Please advise! Thanks.

## 2020-06-29 NOTE — Progress Notes (Deleted)
    SUBJECTIVE:   CHIEF COMPLAINT / HPI:   FMLA paperwork completion: Patient is a 29 year old female presenting today for completion of FMLA paperwork.  Patient has a history of spinal stenosis and is currently awaiting getting in with neurosurgery. ***  PERTINENT  PMH / PSH: ***  OBJECTIVE:   LMP 06/13/2020    General: NAD, pleasant, able to participate in exam Cardiac: RRR, no murmurs. Respiratory: CTAB, normal effort, No wheezes, rales or rhonchi Abdomen: Bowel sounds present, nontender, nondistended, no hepatosplenomegaly. Extremities: no edema or cyanosis. Skin: warm and dry, no rashes noted Neuro: alert, no obvious focal deficits Psych: Normal affect and mood  ASSESSMENT/PLAN:   No problem-specific Assessment & Plan notes found for this encounter.     Jackelyn Poling, DO [facility]    This note was prepared using Dragon voice recognition software and may include unintentional dictation errors due to the inherent limitations of voice recognition software.

## 2020-06-29 NOTE — Telephone Encounter (Signed)
Called patient per her request.  Patient states that she had a family member recently passed away and so she forgot about her appointment today.  I expressed my condolences to the patient.  That I am unfortunately on her inpatient service starting next week and so have limited clinic schedule during the next month.  I explained to her that I am in clinic 1/2 day/week and the next clinic day is on the third, Monday.  I am currently booked up at that time but recommend to the patient that she call our office the morning of the third around 830 or 9 AM to inquire about any openings in my schedule, and then call back again right after lunch around 130 to see if anyone has canceled and I have any spots open.  Explained to the patient that if she is unable to get a spot open on my calendar she may need to get with another provider as she needs to have her paperwork completed by the 11th and I do not have any other clinic days schedule between Monday the third and the 11th.  Patient's versus her understanding and plans to call our office on Monday to see if anyone has canceled on my schedule.

## 2020-07-03 NOTE — Telephone Encounter (Signed)
I am not sure why this was sent to me

## 2020-07-06 ENCOUNTER — Telehealth: Payer: Self-pay | Admitting: Family Medicine

## 2020-07-06 NOTE — Telephone Encounter (Signed)
Patient's daughter dropped off forms to be completed by the doctor. Last DOS: 06/15/2020. Patient would like a call when forms are ready to be picked up. [phone]. Placing in the [facility] teams folder. Thanks

## 2020-07-07 NOTE — Telephone Encounter (Signed)
Clinical info completed on Weight loss form.  Place form in PCP's box for completion.  Aquilla Solian, CMA

## 2020-07-11 ENCOUNTER — Telehealth: Payer: Self-pay

## 2020-07-11 NOTE — Telephone Encounter (Signed)
Spoke with Ms. Somers about her long term disability form. Informed Ms. Sybert that we do not handle long term disability forms, that she would need to find a long term disability physician specialist. The was also confirmed per Dr. Atha Starks and Dr. Manson Passey. Pt asked for Dr. Atha Starks to give her a call. I informed her that Dr. Atha Starks is in inpatient at the moment and that it may be a few days or weeks before he can contact her. Pt was fine with that. Aquilla Solian, CMA

## 2020-07-12 NOTE — Telephone Encounter (Signed)
Called patient and addressed her questions.

## 2020-07-17 ENCOUNTER — Ambulatory Visit: Payer: Medicaid Other | Admitting: Family Medicine

## 2020-07-26 ENCOUNTER — Telehealth: Payer: Self-pay | Admitting: Family Medicine

## 2020-07-26 NOTE — Telephone Encounter (Signed)
Pt walked in requesting the paperwork for her disability.  She knows it was not completed, but has court on tomorrow at 2 p.m.  Pls call [phone]   Urgent

## 2020-07-27 NOTE — Telephone Encounter (Signed)
Attempted to return patient's call regarding filling out her long-term disability.  Patient was unable to be reached and I did leave a HIPAA compliant voicemail stating that I had called.  As discussed during previous phone calls, and confirmed with an attending today, we do not fill out long-term disability at our office.  I had previously provided the patient with avenues for getting this completed, so I am unsure what her question or request is in regard to this.    If the patient has other disability forms that need to be filled out such as FMLA I am happy to address those if appropriate.

## 2020-08-08 DIAGNOSIS — Z79899 Other long term (current) drug therapy: Secondary | ICD-10-CM | POA: Diagnosis not present

## 2020-08-11 DIAGNOSIS — G8929 Other chronic pain: Secondary | ICD-10-CM | POA: Diagnosis not present

## 2020-08-11 DIAGNOSIS — Z79899 Other long term (current) drug therapy: Secondary | ICD-10-CM | POA: Diagnosis not present

## 2020-08-11 DIAGNOSIS — M545 Low back pain, unspecified: Secondary | ICD-10-CM | POA: Diagnosis not present

## 2020-08-24 DIAGNOSIS — M545 Low back pain, unspecified: Secondary | ICD-10-CM | POA: Diagnosis not present

## 2020-08-24 DIAGNOSIS — Z79899 Other long term (current) drug therapy: Secondary | ICD-10-CM | POA: Diagnosis not present

## 2020-08-28 ENCOUNTER — Emergency Department (HOSPITAL_COMMUNITY)
Admission: EM | Admit: 2020-08-28 | Discharge: 2020-08-29 | Disposition: A | Discharge status: Home / Self Care | Payer: Medicaid Other | Attending: Emergency Medicine | Admitting: Emergency Medicine

## 2020-08-28 ENCOUNTER — Encounter (HOSPITAL_COMMUNITY): Payer: Self-pay | Admitting: Emergency Medicine

## 2020-08-28 ENCOUNTER — Other Ambulatory Visit: Payer: Self-pay

## 2020-08-28 DIAGNOSIS — M79642 Pain in left hand: Secondary | ICD-10-CM | POA: Diagnosis not present

## 2020-08-28 DIAGNOSIS — M79662 Pain in left lower leg: Secondary | ICD-10-CM | POA: Diagnosis not present

## 2020-08-28 DIAGNOSIS — W2209XA Striking against other stationary object, initial encounter: Secondary | ICD-10-CM | POA: Diagnosis not present

## 2020-08-28 DIAGNOSIS — Z79899 Other long term (current) drug therapy: Secondary | ICD-10-CM | POA: Diagnosis not present

## 2020-08-28 DIAGNOSIS — Z87891 Personal history of nicotine dependence: Secondary | ICD-10-CM | POA: Diagnosis not present

## 2020-08-28 DIAGNOSIS — I10 Essential (primary) hypertension: Secondary | ICD-10-CM | POA: Insufficient documentation

## 2020-08-28 LAB — COMPREHENSIVE METABOLIC PANEL
ALT: 12 U/L (ref 0–44)
AST: 17 U/L (ref 15–41)
Albumin: 3.2 g/dL — ABNORMAL LOW (ref 3.5–5.0)
Alkaline Phosphatase: 115 U/L (ref 38–126)
Anion gap: 8 (ref 5–15)
BUN: 14 mg/dL (ref 6–20)
CO2: 27 mmol/L (ref 22–32)
Calcium: 8.8 mg/dL — ABNORMAL LOW (ref 8.9–10.3)
Chloride: 103 mmol/L (ref 98–111)
Creatinine, Ser: 1.05 mg/dL — ABNORMAL HIGH (ref 0.44–1.00)
GFR, Estimated: >60 mL/min (ref >60)
Glucose, Bld: 102 mg/dL — ABNORMAL HIGH (ref 70–99)
Potassium: 4.3 mmol/L (ref 3.5–5.1)
Sodium: 138 mmol/L (ref 135–145)
Total Bilirubin: 0.4 mg/dL (ref 0.3–1.2)
Total Protein: 7.7 g/dL (ref 6.5–8.1)

## 2020-08-28 LAB — CBC WITH DIFFERENTIAL/PLATELET
Abs Immature Granulocytes: 0.03 10*3/uL (ref 0.00–0.07)
Basophils Absolute: 0.1 10*3/uL (ref 0.0–0.1)
Basophils Relative: 1 %
Eosinophils Absolute: 0.1 10*3/uL (ref 0.0–0.5)
Eosinophils Relative: 1 %
HCT: 39.8 % (ref 36.0–46.0)
Hemoglobin: 12.5 g/dL (ref 12.0–15.0)
Immature Granulocytes: 0 %
Lymphocytes Relative: 34 %
Lymphs Abs: 3.9 10*3/uL (ref 0.7–4.0)
MCH: 26.4 pg (ref 26.0–34.0)
MCHC: 31.4 g/dL (ref 30.0–36.0)
MCV: 84 fL (ref 80.0–100.0)
Monocytes Absolute: 0.7 10*3/uL (ref 0.1–1.0)
Monocytes Relative: 6 %
Neutro Abs: 6.5 10*3/uL (ref 1.7–7.7)
Neutrophils Relative %: 58 %
Platelets: 321 10*3/uL (ref 150–400)
RBC: 4.74 MIL/uL (ref 3.87–5.11)
RDW: 15.2 % (ref 11.5–15.5)
WBC: 11.3 10*3/uL — ABNORMAL HIGH (ref 4.0–10.5)
nRBC: 0 % (ref 0.0–0.2)

## 2020-08-28 LAB — URINALYSIS, ROUTINE W REFLEX MICROSCOPIC
Bacteria, UA: NONE SEEN
Bilirubin Urine: NEGATIVE
Glucose, UA: NEGATIVE mg/dL
Hgb urine dipstick: NEGATIVE
Ketones, ur: NEGATIVE mg/dL
Nitrite: NEGATIVE
Protein, ur: NEGATIVE mg/dL
Specific Gravity, Urine: 1.021 (ref 1.005–1.030)
pH: 5 (ref 5.0–8.0)

## 2020-08-28 NOTE — ED Triage Notes (Signed)
Pt reports she has left leg pain X47month.  "I think I have a blood clot."  Pt was ambulatory in triage and had good pulses.   Pt also c/o left hand pain from an "old injury."  Pt is a poor historian, difficult to get information.

## 2020-08-29 ENCOUNTER — Ambulatory Visit: Payer: Medicaid Other | Admitting: Family Medicine

## 2020-08-29 ENCOUNTER — Emergency Department (HOSPITAL_COMMUNITY): Payer: Medicaid Other

## 2020-08-29 ENCOUNTER — Ambulatory Visit (HOSPITAL_COMMUNITY)
Admission: RE | Admit: 2020-08-29 | Discharge: 2020-08-29 | Disposition: A | Discharge status: Home / Self Care | Payer: Medicaid Other | Source: Ambulatory Visit | Attending: Emergency Medicine | Admitting: Emergency Medicine

## 2020-08-29 DIAGNOSIS — M79662 Pain in left lower leg: Secondary | ICD-10-CM

## 2020-08-29 DIAGNOSIS — M79642 Pain in left hand: Secondary | ICD-10-CM | POA: Diagnosis not present

## 2020-08-29 NOTE — Progress Notes (Signed)
Lower extremity venous LT study completed.   Please see CV Proc for preliminary results.   Ovida Delagarza, RDMS, RVT  

## 2020-08-29 NOTE — Progress Notes (Signed)
Orthopedic Tech Progress Note Patient Details:  ONETTA SPAINHOWER 02-24-1991 [id number]  Ortho Devices Type of Ortho Device: Thumb velcro splint Ortho Device/Splint Location: lue. dr approved velcro. Ortho Device/Splint Interventions: Ordered,Application,Adjustment   Post Interventions Patient Tolerated: Well Instructions Provided: Care of device,Adjustment of device   [name] Post 08/29/2020, 1:54 AM
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
## 2020-08-30 ENCOUNTER — Telehealth: Payer: Self-pay

## 2020-08-30 NOTE — Telephone Encounter (Signed)
Transition Care Management Follow-up Telephone Call  Date of discharge and from where: 08/29/2020 from [facility]  How have you been since you were released from the hospital? Pt states that she is feeling a little bit better.   Any questions or concerns? No  Items Reviewed:  Did the pt receive and understand the discharge instructions provided? Yes   Medications obtained and verified? Yes   Other? No   Any new allergies since your discharge? No   Dietary orders reviewed? n/a  Do you have support at home? Yes   Functional Questionnaire: (I = Independent and D = Dependent) ADLs: I  Bathing/Dressing- I  Meal Prep- I  Eating- I  Maintaining continence- I  Transferring/Ambulation- I  Managing Meds- I   Follow up appointments reviewed:   PCP Hospital f/u appt confirmed? No    Specialist Hospital f/u appt confirmed? No  pt will call ortho for her appt.   Are transportation arrangements needed? No   If their condition worsens, is the pt aware to call PCP or go to the Emergency Dept.? Yes  Was the patient provided with contact information for the PCP's office or ED? Yes  Was to pt encouraged to call back with questions or concerns? Yes

## 2020-09-06 ENCOUNTER — Ambulatory Visit: Payer: Medicaid Other | Admitting: Family Medicine

## 2020-09-06 DIAGNOSIS — M79642 Pain in left hand: Secondary | ICD-10-CM | POA: Diagnosis not present

## 2020-09-11 ENCOUNTER — Ambulatory Visit: Payer: Medicaid Other | Admitting: Family Medicine

## 2020-09-28 DIAGNOSIS — M545 Low back pain, unspecified: Secondary | ICD-10-CM | POA: Diagnosis not present

## 2020-09-28 DIAGNOSIS — Z79899 Other long term (current) drug therapy: Secondary | ICD-10-CM | POA: Diagnosis not present

## 2020-11-15 ENCOUNTER — Telehealth: Payer: Self-pay | Admitting: Family Medicine

## 2020-11-15 NOTE — Telephone Encounter (Signed)
  Medicaid Managed Care   Unsuccessful Outreach Note  11/15/2020 Name: Shirley [name] MRN: [id number] DOB: April 24, 1991  Referred by: Jackelyn Poling, DO Reason for referral : High Risk Managed Medicaid (Attempted to reach the patient today to get her scheduled with the [facility] RNCM for a telephone visit. I left a detailed message on her VM.)   An unsuccessful telephone outreach was attempted today. The patient was referred to the case management team for assistance with care management and care coordination.   Follow Up Plan: The care management team will reach out to the patient again over the next 7-14 days.   Weston Settle Care Guide, High Risk Medicaid Managed Care Embedded Care Coordination [facility]

## 2020-12-05 DIAGNOSIS — Z79899 Other long term (current) drug therapy: Secondary | ICD-10-CM | POA: Diagnosis not present

## 2020-12-05 DIAGNOSIS — M545 Low back pain, unspecified: Secondary | ICD-10-CM | POA: Diagnosis not present

## 2020-12-12 DIAGNOSIS — Z79899 Other long term (current) drug therapy: Secondary | ICD-10-CM | POA: Diagnosis not present

## 2021-01-08 DIAGNOSIS — I10 Essential (primary) hypertension: Secondary | ICD-10-CM | POA: Diagnosis not present

## 2021-01-08 DIAGNOSIS — Z79899 Other long term (current) drug therapy: Secondary | ICD-10-CM | POA: Diagnosis not present

## 2021-01-08 DIAGNOSIS — M545 Low back pain, unspecified: Secondary | ICD-10-CM | POA: Diagnosis not present

## 2021-02-08 DIAGNOSIS — Z79899 Other long term (current) drug therapy: Secondary | ICD-10-CM | POA: Diagnosis not present

## 2021-02-12 DIAGNOSIS — Z79899 Other long term (current) drug therapy: Secondary | ICD-10-CM | POA: Diagnosis not present

## 2021-02-13 DIAGNOSIS — L732 Hidradenitis suppurativa: Secondary | ICD-10-CM | POA: Diagnosis not present

## 2021-02-13 DIAGNOSIS — Z1159 Encounter for screening for other viral diseases: Secondary | ICD-10-CM | POA: Diagnosis not present

## 2021-02-13 DIAGNOSIS — N914 Secondary oligomenorrhea: Secondary | ICD-10-CM | POA: Diagnosis not present

## 2021-02-13 DIAGNOSIS — Z13220 Encounter for screening for lipoid disorders: Secondary | ICD-10-CM | POA: Diagnosis not present

## 2021-02-13 DIAGNOSIS — I10 Essential (primary) hypertension: Secondary | ICD-10-CM | POA: Diagnosis not present

## 2021-02-13 DIAGNOSIS — Z131 Encounter for screening for diabetes mellitus: Secondary | ICD-10-CM | POA: Diagnosis not present

## 2021-02-13 DIAGNOSIS — E559 Vitamin D deficiency, unspecified: Secondary | ICD-10-CM | POA: Diagnosis not present

## 2021-02-13 DIAGNOSIS — R5383 Other fatigue: Secondary | ICD-10-CM | POA: Diagnosis not present

## 2021-03-30 DIAGNOSIS — Z79899 Other long term (current) drug therapy: Secondary | ICD-10-CM | POA: Diagnosis not present

## 2021-03-30 DIAGNOSIS — L732 Hidradenitis suppurativa: Secondary | ICD-10-CM | POA: Diagnosis not present

## 2021-03-30 DIAGNOSIS — M25562 Pain in left knee: Secondary | ICD-10-CM | POA: Diagnosis not present

## 2021-03-30 DIAGNOSIS — I10 Essential (primary) hypertension: Secondary | ICD-10-CM | POA: Diagnosis not present

## 2021-03-30 DIAGNOSIS — M545 Low back pain, unspecified: Secondary | ICD-10-CM | POA: Diagnosis not present

## 2021-04-14 DIAGNOSIS — G8929 Other chronic pain: Secondary | ICD-10-CM | POA: Diagnosis not present

## 2021-04-14 DIAGNOSIS — M545 Low back pain, unspecified: Secondary | ICD-10-CM | POA: Diagnosis not present

## 2021-04-14 DIAGNOSIS — M25562 Pain in left knee: Secondary | ICD-10-CM | POA: Diagnosis not present

## 2021-04-14 DIAGNOSIS — G629 Polyneuropathy, unspecified: Secondary | ICD-10-CM | POA: Diagnosis not present

## 2021-04-14 DIAGNOSIS — M25561 Pain in right knee: Secondary | ICD-10-CM | POA: Diagnosis not present

## 2021-04-14 DIAGNOSIS — N914 Secondary oligomenorrhea: Secondary | ICD-10-CM | POA: Diagnosis not present

## 2021-04-14 DIAGNOSIS — M542 Cervicalgia: Secondary | ICD-10-CM | POA: Diagnosis not present

## 2021-04-14 DIAGNOSIS — Z79899 Other long term (current) drug therapy: Secondary | ICD-10-CM | POA: Diagnosis not present

## 2021-04-18 DIAGNOSIS — Z79899 Other long term (current) drug therapy: Secondary | ICD-10-CM | POA: Diagnosis not present

## 2021-05-04 DIAGNOSIS — M542 Cervicalgia: Secondary | ICD-10-CM | POA: Diagnosis not present

## 2021-05-04 DIAGNOSIS — M545 Low back pain, unspecified: Secondary | ICD-10-CM | POA: Diagnosis not present

## 2021-05-04 DIAGNOSIS — N914 Secondary oligomenorrhea: Secondary | ICD-10-CM | POA: Diagnosis not present

## 2021-05-04 DIAGNOSIS — Z79899 Other long term (current) drug therapy: Secondary | ICD-10-CM | POA: Diagnosis not present

## 2021-05-04 DIAGNOSIS — G629 Polyneuropathy, unspecified: Secondary | ICD-10-CM | POA: Diagnosis not present

## 2021-05-04 DIAGNOSIS — M25561 Pain in right knee: Secondary | ICD-10-CM | POA: Diagnosis not present

## 2021-05-07 DIAGNOSIS — Z79899 Other long term (current) drug therapy: Secondary | ICD-10-CM | POA: Diagnosis not present

## 2021-05-14 ENCOUNTER — Other Ambulatory Visit: Payer: Self-pay

## 2021-05-14 ENCOUNTER — Emergency Department (HOSPITAL_COMMUNITY)
Admission: EM | Admit: 2021-05-14 | Discharge: 2021-05-15 | Disposition: A | Discharge status: Home / Self Care | Payer: Medicaid Other | Attending: Obstetrics and Gynecology | Admitting: Obstetrics and Gynecology

## 2021-05-14 ENCOUNTER — Encounter (HOSPITAL_COMMUNITY): Payer: Self-pay | Admitting: Emergency Medicine

## 2021-05-14 DIAGNOSIS — R519 Headache, unspecified: Secondary | ICD-10-CM | POA: Insufficient documentation

## 2021-05-14 DIAGNOSIS — Z5321 Procedure and treatment not carried out due to patient leaving prior to being seen by health care provider: Secondary | ICD-10-CM | POA: Insufficient documentation

## 2021-05-14 DIAGNOSIS — Z3A01 Less than 8 weeks gestation of pregnancy: Secondary | ICD-10-CM | POA: Diagnosis not present

## 2021-05-14 DIAGNOSIS — Z87891 Personal history of nicotine dependence: Secondary | ICD-10-CM | POA: Insufficient documentation

## 2021-05-14 DIAGNOSIS — O209 Hemorrhage in early pregnancy, unspecified: Secondary | ICD-10-CM | POA: Diagnosis not present

## 2021-05-14 DIAGNOSIS — O26891 Other specified pregnancy related conditions, first trimester: Secondary | ICD-10-CM | POA: Diagnosis not present

## 2021-05-14 DIAGNOSIS — R102 Pelvic and perineal pain: Secondary | ICD-10-CM | POA: Insufficient documentation

## 2021-05-14 DIAGNOSIS — O26851 Spotting complicating pregnancy, first trimester: Secondary | ICD-10-CM | POA: Diagnosis not present

## 2021-05-14 DIAGNOSIS — R109 Unspecified abdominal pain: Secondary | ICD-10-CM | POA: Diagnosis not present

## 2021-05-14 DIAGNOSIS — R1110 Vomiting, unspecified: Secondary | ICD-10-CM | POA: Diagnosis not present

## 2021-05-14 DIAGNOSIS — R112 Nausea with vomiting, unspecified: Secondary | ICD-10-CM | POA: Insufficient documentation

## 2021-05-14 LAB — COMPREHENSIVE METABOLIC PANEL
ALT: 13 U/L (ref 0–44)
AST: 15 U/L (ref 15–41)
Albumin: 3.1 g/dL — ABNORMAL LOW (ref 3.5–5.0)
Alkaline Phosphatase: 95 U/L (ref 38–126)
Anion gap: 7 (ref 5–15)
BUN: 10 mg/dL (ref 6–20)
CO2: 27 mmol/L (ref 22–32)
Calcium: 8.6 mg/dL — ABNORMAL LOW (ref 8.9–10.3)
Chloride: 103 mmol/L (ref 98–111)
Creatinine, Ser: 0.94 mg/dL (ref 0.44–1.00)
GFR, Estimated: >60 mL/min (ref >60)
Glucose, Bld: 101 mg/dL — ABNORMAL HIGH (ref 70–99)
Potassium: 3.7 mmol/L (ref 3.5–5.1)
Sodium: 137 mmol/L (ref 135–145)
Total Bilirubin: 0.3 mg/dL (ref 0.3–1.2)
Total Protein: 7.4 g/dL (ref 6.5–8.1)

## 2021-05-14 LAB — URINALYSIS, ROUTINE W REFLEX MICROSCOPIC
Bacteria, UA: NONE SEEN
Bilirubin Urine: NEGATIVE
Glucose, UA: NEGATIVE mg/dL
Ketones, ur: NEGATIVE mg/dL
Leukocytes,Ua: NEGATIVE
Nitrite: NEGATIVE
Protein, ur: NEGATIVE mg/dL
Specific Gravity, Urine: 1.017 (ref 1.005–1.030)
pH: 6 (ref 5.0–8.0)

## 2021-05-14 LAB — CBC
HCT: 38 % (ref 36.0–46.0)
Hemoglobin: 11.7 g/dL — ABNORMAL LOW (ref 12.0–15.0)
MCH: 26 pg (ref 26.0–34.0)
MCHC: 30.8 g/dL (ref 30.0–36.0)
MCV: 84.4 fL (ref 80.0–100.0)
Platelets: 338 10*3/uL (ref 150–400)
RBC: 4.5 MIL/uL (ref 3.87–5.11)
RDW: 14.4 % (ref 11.5–15.5)
WBC: 11.1 10*3/uL — ABNORMAL HIGH (ref 4.0–10.5)
nRBC: 0 % (ref 0.0–0.2)

## 2021-05-14 LAB — I-STAT BETA HCG BLOOD, ED (MC, WL, AP ONLY): I-stat hCG, quantitative: 640.6 m[IU]/mL — ABNORMAL HIGH (ref <5)

## 2021-05-14 LAB — LIPASE, BLOOD: Lipase: 131 U/L — ABNORMAL HIGH (ref 11–51)

## 2021-05-14 NOTE — ED Triage Notes (Signed)
Patient here for headache for a couple of days, with nausea and vomiting since last night.  Patient thinks she could be pregnant.  No abdominal pain.

## 2021-05-14 NOTE — ED Provider Notes (Signed)
Emergency Medicine Provider Triage Evaluation Note  Shirley [name] , a 30 y.o. female  was evaluated in triage.  Pt complains of headache for several days with nausea and vomiting last night.  Patient thinks that she could be pregnant, last menstrual period 9/29, as well as light bleeding intermittently at the end of October as well as today.  She states that the bleeding that she had in October was not the same amount as her normal cycle.  She is not complaining of headache or nausea today.  Review of Systems  Positive: Vaginal bleeding, nausea Negative: Abdominal pain, pelvic pain  Physical Exam  BP (!) 183/121 (BP Location: Right Arm)   Pulse 74   Temp 98.6 F (37 C) (Oral)   Resp 15   Ht 5\' 6"  (1.676 m)   Wt 127 kg   SpO2 100%   BMI 45.19 kg/m  Gen:   Awake, no distress   Resp:  Normal effort  MSK:   Moves extremities without difficulty  Other:    Medical Decision Making  Medically screening exam initiated at 8:47 PM.  Appropriate orders placed.  SHEELAH RITACCO was informed that the remainder of the evaluation will be completed by another provider, this initial triage assessment does not replace that evaluation, and the importance of remaining in the ED until their evaluation is complete.     [name] 05/14/21 2050    05/16/21, MD 05/14/21 2306

## 2021-05-15 ENCOUNTER — Other Ambulatory Visit: Payer: Self-pay

## 2021-05-15 ENCOUNTER — Encounter (HOSPITAL_COMMUNITY): Payer: Self-pay | Admitting: Emergency Medicine

## 2021-05-15 ENCOUNTER — Inpatient Hospital Stay (HOSPITAL_COMMUNITY): Payer: Medicaid Other

## 2021-05-15 ENCOUNTER — Inpatient Hospital Stay (EMERGENCY_DEPARTMENT_HOSPITAL)
Admission: AD | Admit: 2021-05-15 | Discharge: 2021-05-15 | Disposition: A | Discharge status: Home / Self Care | Payer: Medicaid Other | Source: Home / Self Care | Attending: Obstetrics and Gynecology | Admitting: Obstetrics and Gynecology

## 2021-05-15 ENCOUNTER — Encounter (HOSPITAL_COMMUNITY): Payer: Self-pay | Admitting: Obstetrics and Gynecology

## 2021-05-15 ENCOUNTER — Ambulatory Visit (HOSPITAL_COMMUNITY)
Admission: EM | Admit: 2021-05-15 | Discharge: 2021-05-15 | Disposition: A | Discharge status: Home / Self Care | Payer: Medicaid Other | Attending: Student | Admitting: Student

## 2021-05-15 DIAGNOSIS — O26851 Spotting complicating pregnancy, first trimester: Secondary | ICD-10-CM | POA: Diagnosis not present

## 2021-05-15 DIAGNOSIS — R102 Pelvic and perineal pain: Secondary | ICD-10-CM

## 2021-05-15 DIAGNOSIS — Z3A00 Weeks of gestation of pregnancy not specified: Secondary | ICD-10-CM

## 2021-05-15 DIAGNOSIS — Z3A01 Less than 8 weeks gestation of pregnancy: Secondary | ICD-10-CM | POA: Insufficient documentation

## 2021-05-15 DIAGNOSIS — Z87891 Personal history of nicotine dependence: Secondary | ICD-10-CM | POA: Insufficient documentation

## 2021-05-15 DIAGNOSIS — O219 Vomiting of pregnancy, unspecified: Secondary | ICD-10-CM

## 2021-05-15 DIAGNOSIS — R109 Unspecified abdominal pain: Secondary | ICD-10-CM | POA: Diagnosis not present

## 2021-05-15 DIAGNOSIS — Z3201 Encounter for pregnancy test, result positive: Secondary | ICD-10-CM | POA: Diagnosis not present

## 2021-05-15 DIAGNOSIS — O4690 Antepartum hemorrhage, unspecified, unspecified trimester: Secondary | ICD-10-CM

## 2021-05-15 DIAGNOSIS — O209 Hemorrhage in early pregnancy, unspecified: Secondary | ICD-10-CM | POA: Diagnosis not present

## 2021-05-15 DIAGNOSIS — O26891 Other specified pregnancy related conditions, first trimester: Secondary | ICD-10-CM | POA: Insufficient documentation

## 2021-05-15 DIAGNOSIS — I10 Essential (primary) hypertension: Secondary | ICD-10-CM

## 2021-05-15 DIAGNOSIS — O3680X0 Pregnancy with inconclusive fetal viability, not applicable or unspecified: Secondary | ICD-10-CM

## 2021-05-15 LAB — COMPREHENSIVE METABOLIC PANEL
ALT: 12 U/L (ref 0–44)
AST: 15 U/L (ref 15–41)
Albumin: 2.9 g/dL — ABNORMAL LOW (ref 3.5–5.0)
Alkaline Phosphatase: 86 U/L (ref 38–126)
Anion gap: 8 (ref 5–15)
BUN: 11 mg/dL (ref 6–20)
CO2: 26 mmol/L (ref 22–32)
Calcium: 8.6 mg/dL — ABNORMAL LOW (ref 8.9–10.3)
Chloride: 104 mmol/L (ref 98–111)
Creatinine, Ser: 0.98 mg/dL (ref 0.44–1.00)
GFR, Estimated: >60 mL/min (ref >60)
Glucose, Bld: 91 mg/dL (ref 70–99)
Potassium: 3.7 mmol/L (ref 3.5–5.1)
Sodium: 138 mmol/L (ref 135–145)
Total Bilirubin: 0.6 mg/dL (ref 0.3–1.2)
Total Protein: 7.3 g/dL (ref 6.5–8.1)

## 2021-05-15 LAB — CBC WITH DIFFERENTIAL/PLATELET
Abs Immature Granulocytes: 0.03 10*3/uL (ref 0.00–0.07)
Basophils Absolute: 0.1 10*3/uL (ref 0.0–0.1)
Basophils Relative: 1 %
Eosinophils Absolute: 0.1 10*3/uL (ref 0.0–0.5)
Eosinophils Relative: 1 %
HCT: 36.9 % (ref 36.0–46.0)
Hemoglobin: 11.7 g/dL — ABNORMAL LOW (ref 12.0–15.0)
Immature Granulocytes: 0 %
Lymphocytes Relative: 29 %
Lymphs Abs: 3.5 10*3/uL (ref 0.7–4.0)
MCH: 26.2 pg (ref 26.0–34.0)
MCHC: 31.7 g/dL (ref 30.0–36.0)
MCV: 82.6 fL (ref 80.0–100.0)
Monocytes Absolute: 0.8 10*3/uL (ref 0.1–1.0)
Monocytes Relative: 7 %
Neutro Abs: 7.3 10*3/uL (ref 1.7–7.7)
Neutrophils Relative %: 62 %
Platelets: 347 10*3/uL (ref 150–400)
RBC: 4.47 MIL/uL (ref 3.87–5.11)
RDW: 14.3 % (ref 11.5–15.5)
WBC: 11.7 10*3/uL — ABNORMAL HIGH (ref 4.0–10.5)
nRBC: 0 % (ref 0.0–0.2)

## 2021-05-15 LAB — WET PREP, GENITAL
Sperm: NONE SEEN
Trich, Wet Prep: NONE SEEN
WBC, Wet Prep HPF POC: >=10 — AB (ref <10)
Yeast Wet Prep HPF POC: NONE SEEN

## 2021-05-15 LAB — POCT URINALYSIS DIPSTICK, ED / UC
Bilirubin Urine: NEGATIVE
Glucose, UA: NEGATIVE mg/dL
Hgb urine dipstick: NEGATIVE
Ketones, ur: NEGATIVE mg/dL
Leukocytes,Ua: NEGATIVE
Nitrite: NEGATIVE
Protein, ur: NEGATIVE mg/dL
Specific Gravity, Urine: 1.025 (ref 1.005–1.030)
Urobilinogen, UA: 0.2 mg/dL (ref 0.0–1.0)
pH: 7 (ref 5.0–8.0)

## 2021-05-15 LAB — URINALYSIS, ROUTINE W REFLEX MICROSCOPIC
Bilirubin Urine: NEGATIVE
Glucose, UA: NEGATIVE mg/dL
Hgb urine dipstick: NEGATIVE
Ketones, ur: NEGATIVE mg/dL
Leukocytes,Ua: NEGATIVE
Nitrite: NEGATIVE
Protein, ur: NEGATIVE mg/dL
Specific Gravity, Urine: 1.029 (ref 1.005–1.030)
pH: 6 (ref 5.0–8.0)

## 2021-05-15 LAB — POC URINE PREG, ED: Preg Test, Ur: POSITIVE — AB

## 2021-05-15 LAB — HCG, QUANTITATIVE, PREGNANCY: hCG, Beta Chain, Quant, S: 1020 m[IU]/mL — ABNORMAL HIGH (ref <5)

## 2021-05-15 MED ORDER — PROMETHAZINE HCL 25 MG PO TABS: 25.0000 mg | ORAL_TABLET | Freq: Four times a day (QID) | ORAL | 0 refills | Status: DC | PRN

## 2021-05-15 NOTE — MAU Note (Signed)
Presents with c/o abdominal pain and spotting.  Reports spotting has been intermittent x1 week.  States abdominal pain began yesterday.  LMP 04/28/2021

## 2021-05-15 NOTE — Discharge Instructions (Addendum)
-  I am concerned for a miscarriage given your abd pain and spotting. [facility]

## 2021-05-15 NOTE — ED Notes (Signed)
X2 for vitals recheck with no response °

## 2021-05-15 NOTE — MAU Provider Note (Signed)
Chief Complaint: Abdominal Pain   Event Date/Time   First Provider Initiated Contact with Patient 05/15/21 2157        SUBJECTIVE HPI: Shirley [name] is a 30 y.o. [id number] at [redacted]w[redacted]d by LMP who presents to maternity admissions reporting pelvic cramping and spotting.  Has had these off and on for a week.  . She denies vaginal itching/burning, urinary symptoms, h/a, dizziness, n/v, or fever/chills.    Abdominal Pain This is a new problem. The current episode started in the past 7 days. The onset quality is gradual. The problem occurs intermittently. The problem has been unchanged. The quality of the pain is cramping. The abdominal pain does not radiate. Pertinent negatives include no diarrhea, dysuria, fever, frequency, headaches, myalgias, nausea or vomiting. Nothing aggravates the pain. The pain is relieved by Nothing. She has tried nothing for the symptoms.  Vaginal Bleeding The patient's primary symptoms include pelvic pain and vaginal bleeding. The patient's pertinent negatives include no genital itching, genital lesions or genital odor. This is a new problem. The current episode started in the past 7 days. The problem occurs intermittently. The problem has been unchanged. The pain is mild. She is pregnant. Associated symptoms include abdominal pain. Pertinent negatives include no diarrhea, dysuria, fever, frequency, headaches, nausea or vomiting. The vaginal discharge was bloody. The vaginal bleeding is spotting. She has not been passing clots. She has not been passing tissue. Nothing aggravates the symptoms. She has tried nothing for the symptoms.  RN Note: Presents with c/o abdominal pain and spotting.  Reports spotting has been intermittent x1 week.  States abdominal pain began yesterday.  LMP 04/28/2021  Past Medical History:  Diagnosis Date   Anxiety    Arthritis    Back pain    Chest pain    Constipation    Depression    Edema, lower extremity    GERD (gastroesophageal reflux  disease)    Hip dysplasia    Hypertension    Joint pain    Obesity    Osteoarthritis    Rheumatoid arthritis (HCC)    Sciatica    Shortness of breath    Snapping hip syndrome    Past Surgical History:  Procedure Laterality Date   TOOTH EXTRACTION N/A 10/22/2013   Procedure: DENTAL EXTRACTIONS TEETH #1, 16, 17, 32;  Surgeon: Georgia Lopes, DDS;  Location: [facility] OR;  Service: Oral Surgery;  Laterality: N/A;   Social History   Socioeconomic History   Marital status: Single    Spouse name: Not on file   Number of children: 3   Years of education: Not on file   Highest education level: Not on file  Occupational History   Occupation: stay at home mom  Tobacco Use   Smoking status: Former    Packs/day: 0.50    Types: Cigarettes    Quit date: 01/29/2017    Years since quitting: 4.2   Smokeless tobacco: Never  Substance and Sexual Activity   Alcohol use: No    Alcohol/week: 0.0 standard drinks    Comment: rare   Drug use: No    Types: Marijuana    Comment: daily usage   Sexual activity: Yes    Birth control/protection: None  Other Topics Concern   Not on file  Social History Narrative   Not on file   Social Determinants of Health   Financial Resource Strain: Not on file  Food Insecurity: Not on file  Transportation Needs: Not on file  Physical Activity: Not  on file  Stress: Not on file  Social Connections: Not on file  Intimate Partner Violence: Not on file   No current facility-administered medications on file prior to encounter.   Current Outpatient Medications on File Prior to Encounter  Medication Sig Dispense Refill   amLODipine (NORVASC) 10 MG tablet Take 1 tablet (10 mg total) by mouth daily. 90 tablet 3   gabapentin (NEURONTIN) 100 MG capsule Take 1 capsule (100 mg total) by mouth at bedtime as needed (pain). 30 capsule 0   naproxen (NAPROSYN) 500 MG tablet Take 1 tablet (500 mg total) by mouth 2 (two) times daily. 20 tablet 0   No Known Allergies  I have  reviewed patient's Past Medical Hx, Surgical Hx, Family Hx, Social Hx, medications and allergies.   ROS:  Review of Systems  Constitutional:  Negative for fever.  Gastrointestinal:  Positive for abdominal pain. Negative for diarrhea, nausea and vomiting.  Genitourinary:  Positive for pelvic pain and vaginal bleeding. Negative for dysuria and frequency.  Musculoskeletal:  Negative for myalgias.  Neurological:  Negative for headaches.  Review of Systems  Other systems negative   Physical Exam  Physical Exam Patient Vitals for the past 24 hrs:  BP Temp Temp src Pulse Resp SpO2 Height Weight  05/15/21 1820 (!) 187/109 -- -- 81 -- -- -- --  05/15/21 1817 (!) 186/105 98.3 F (36.8 C) Oral 78 20 96 % -- --  05/15/21 1812 -- -- -- -- -- -- 5\' 6"  (1.676 m) (!) 136.4 kg   Constitutional: Well-developed, well-nourished female in no acute distress.  Cardiovascular: normal rate Respiratory: normal effort GI: Abd soft, non-tender.  MS: Extremities nontender, no edema, normal ROM Neurologic: Alert and oriented x 4.  GU: Neg CVAT.  PELVIC EXAM: Deferred due to extremely high census and acuity on unit, so that patient was managed from waiting room and family room  LAB RESULTS Results for orders placed or performed during the hospital encounter of 05/15/21 (from the past 24 hour(s))  CBC with Differential/Platelet     Status: Abnormal   Collection Time: 05/15/21  6:30 PM  Result Value Ref Range   WBC 11.7 (H) 4.0 - 10.5 K/uL   RBC 4.47 3.87 - 5.11 MIL/uL   Hemoglobin 11.7 (L) 12.0 - 15.0 g/dL   HCT 85.0 27.7 - 41.2 %   MCV 82.6 80.0 - 100.0 fL   MCH 26.2 26.0 - 34.0 pg   MCHC 31.7 30.0 - 36.0 g/dL   RDW 87.8 67.6 - 72.0 %   Platelets 347 150 - 400 K/uL   nRBC 0.0 0.0 - 0.2 %   Neutrophils Relative % 62 %   Neutro Abs 7.3 1.7 - 7.7 K/uL   Lymphocytes Relative 29 %   Lymphs Abs 3.5 0.7 - 4.0 K/uL   Monocytes Relative 7 %   Monocytes Absolute 0.8 0.1 - 1.0 K/uL   Eosinophils Relative  1 %   Eosinophils Absolute 0.1 0.0 - 0.5 K/uL   Basophils Relative 1 %   Basophils Absolute 0.1 0.0 - 0.1 K/uL   Immature Granulocytes 0 %   Abs Immature Granulocytes 0.03 0.00 - 0.07 K/uL  Comprehensive metabolic panel     Status: Abnormal   Collection Time: 05/15/21  6:30 PM  Result Value Ref Range   Sodium 138 135 - 145 mmol/L   Potassium 3.7 3.5 - 5.1 mmol/L   Chloride 104 98 - 111 mmol/L   CO2 26 22 - 32 mmol/L  Glucose, Bld 91 70 - 99 mg/dL   BUN 11 6 - 20 mg/dL   Creatinine, Ser 7.61 0.44 - 1.00 mg/dL   Calcium 8.6 (L) 8.9 - 10.3 mg/dL   Total Protein 7.3 6.5 - 8.1 g/dL   Albumin 2.9 (L) 3.5 - 5.0 g/dL   AST 15 15 - 41 U/L   ALT 12 0 - 44 U/L   Alkaline Phosphatase 86 38 - 126 U/L   Total Bilirubin 0.6 0.3 - 1.2 mg/dL   GFR, Estimated >60 >73 mL/min   Anion gap 8 5 - 15  hCG, quantitative, pregnancy     Status: Abnormal   Collection Time: 05/15/21  6:30 PM  Result Value Ref Range   hCG, Beta Chain, Quant, S 1,020 (H) <5 mIU/mL  Urinalysis, Routine w reflex microscopic Urine, Clean Catch     Status: None   Collection Time: 05/15/21  6:48 PM  Result Value Ref Range   Color, Urine YELLOW YELLOW   APPearance CLEAR CLEAR   Specific Gravity, Urine 1.029 1.005 - 1.030   pH 6.0 5.0 - 8.0   Glucose, UA NEGATIVE NEGATIVE mg/dL   Hgb urine dipstick NEGATIVE NEGATIVE   Bilirubin Urine NEGATIVE NEGATIVE   Ketones, ur NEGATIVE NEGATIVE mg/dL   Protein, ur NEGATIVE NEGATIVE mg/dL   Nitrite NEGATIVE NEGATIVE   Leukocytes,Ua NEGATIVE NEGATIVE     IMAGING US OB LESS THAN 14 WEEKS WITH OB TRANSVAGINAL  Result Date: 05/15/2021 CLINICAL DATA:  Abdominal pain, vaginal bleeding, pregnant. LMP 04/28/2021. EXAM: OBSTETRIC <14 WK [place] AND TRANSVAGINAL OB US TECHNIQUE: Both transabdominal and transvaginal ultrasound examinations were performed for complete evaluation of the gestation as well as the maternal uterus, adnexal regions, and pelvic cul-de-sac. Transvaginal technique was  performed to assess early pregnancy. COMPARISON:  None. FINDINGS: Intrauterine gestational sac: Present, single Yolk sac:  Not visualized Embryo:  Not visualized Cardiac Activity: Not applicable MSD: 3 mm   5 w   0 d CRL:  Not applicable Subchorionic hemorrhage:  None visualized. Maternal uterus/adnexae: The uterus is anteverted. No intrauterine masses are seen. The cervix is incompletely visualized, however, the visualized portion appears unremarkable. No free fluid within the cul-de-sac. The maternal ovaries are unremarkable. IMPRESSION: Intrauterine gestational sac identified. Yolk sac not visualized. This roughly estimates the gestational between 5.0 and 5.5 weeks. Follow-up sonography is recommended in 10-14 days to document appropriate progression. Electronically Signed   By: Helyn Numbers M.D.   On: 05/15/2021 22:43     MAU Management/MDM: Ordered usual first trimester r/o ectopic labs.   Pelvic cultures done Will check baseline Ultrasound to rule out ectopic.  This bleeding/pain can represent a normal pregnancy with bleeding, spontaneous abortion or even an ectopic which can be life-threatening.  The process as listed above helps to determine which of these is present.  Reviewed results with patient.  Discussed next step would be to repeat HCG in 48 hours and then [place] in 7-10 days to rule out ectopic  ASSESSMENT 1. Vaginal bleeding in pregnancy   2.   Cramping in pregnancy 3.   Pregnancy unknown location  PLAN Discharge home Plan to repeat HCG level in 48 hours in clinic Friday Will repeat  Ultrasound in about 7-10 days if HCG levels double appropriately  Ectopic precautions  Severe BP noted after patient discharged.  Chart review indicated this is her typical BP range.  There is documentation of medication noncompliance    Will have her change to Labetalol when she comes to  office Friday  Pt stable at time of discharge. Encouraged to return here if she develops worsening of  symptoms, increase in pain, fever, or other concerning symptoms.    Wynelle Bourgeois CNM, MSN Certified Nurse-Midwife 05/15/2021  9:58 PM

## 2021-05-15 NOTE — ED Triage Notes (Signed)
Pt is present today with abdominal pain and nausea. Pt states x started x2 days ago.

## 2021-05-15 NOTE — ED Provider Notes (Signed)
[facility]    CSN: [id number] Arrival date & time: 05/15/21  1437      History   Chief Complaint Chief Complaint  Patient presents with   Abdominal Pain    HPI Shirley [name] is a 30 y.o. female presenting with pregnancy, abd pain, and spotting.  This patient was evaluated in the emergency department last night, unfortunately after waiting she was unable to stay to be seen, but she was informed that she is pregnant.  Endorses 2 days of 8/10 crampy lower abdominal pain with vaginal spotting.  Denies other symptoms like dysuria, hematuria, flank pain, fever/chills, vaginal discharge.Marland Kitchen     HPI  Past Medical History:  Diagnosis Date   Anxiety    Arthritis    Back pain    Chest pain    Constipation    Depression    Edema, lower extremity    GERD (gastroesophageal reflux disease)    Hip dysplasia    Hypertension    Joint pain    Obesity    Osteoarthritis    Rheumatoid arthritis ([place])    Sciatica    Shortness of breath    Snapping hip syndrome     Patient Active Problem List   Diagnosis Date Noted   Assistance needed with transportation 11/22/2019   Low HDL (under 40) 10/05/2019   Vitamin D deficiency 10/05/2019   Elevated alkaline phosphatase level 10/05/2019   Chronic pain syndrome 10/05/2019   Excessive daytime sleepiness 10/05/2019   Anxiety and depression 10/05/2019   Neck pain 07/16/2019   Trapezius muscle spasm 02/25/2019   Acute pain of both shoulders 02/25/2019   Finger pain, right 02/13/2019   Osteoarthritis of both knees 09/29/2015   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   Low back pain with right-sided sciatica 09/15/2015   Essential hypertension    Class 3 severe obesity with serious comorbidity and body mass index (BMI) of 50.0 to 59.9 in adult [facility]) 12/02/2006   Primary hypertension 08/28/2006   [place] 08/28/2006    Past Surgical History:  Procedure Laterality Date   TOOTH EXTRACTION N/A 10/22/2013    Procedure: DENTAL EXTRACTIONS TEETH #1, 16, 17, 32;  Surgeon: Gae Bon, DDS;  Location: [facility];  Service: Oral Surgery;  Laterality: N/A;    OB History     Gravida  4   Para  3   Term  3   Preterm      AB  1   Living  3      SAB      IAB  1   Ectopic      Multiple  0   Live Births  3            Home Medications    Prior to Admission medications   Medication Sig Start Date End Date Taking? Authorizing Provider  amLODipine (NORVASC) 10 MG tablet Take 1 tablet (10 mg total) by mouth daily. 06/15/20   Lurline Del, DO  gabapentin (NEURONTIN) 100 MG capsule Take 1 capsule (100 mg total) by mouth at bedtime as needed (pain). 07/16/19   Caroline More, DO  naproxen (NAPROSYN) 500 MG tablet Take 1 tablet (500 mg total) by mouth 2 (two) times daily. 02/25/19   Danna Hefty, DO    Family History Family History  Problem Relation Age of Onset   Hypertension Mother    Depression Mother    Anxiety disorder Mother    Obesity Mother    Diabetes  Maternal Grandmother    Diabetes Paternal Grandmother    Stroke Paternal Grandmother    Hypertension Father     Social History Social History   Tobacco Use   Smoking status: Former    Packs/day: 0.50    Types: Cigarettes    Quit date: 01/29/2017    Years since quitting: 4.2   Smokeless tobacco: Never  Substance Use Topics   Alcohol use: No    Alcohol/week: 0.0 standard drinks    Comment: rare   Drug use: No    Types: Marijuana    Comment: daily usage     Allergies   Patient has no known allergies.   Review of Systems Review of Systems  Constitutional:  Negative for chills and fever.  HENT:  Negative for sore throat.   Eyes:  Negative for pain and redness.  Respiratory:  Negative for shortness of breath.   Cardiovascular:  Negative for chest pain.  Gastrointestinal:  Positive for abdominal pain. Negative for diarrhea, nausea and vomiting.  Genitourinary:  Positive for vaginal bleeding.  Negative for decreased urine volume, difficulty urinating, dysuria, flank pain, frequency, genital sores, hematuria and urgency.  Musculoskeletal:  Negative for back pain.  Skin:  Negative for rash.  All other systems reviewed and are negative.   Physical Exam Triage Vital Signs ED Triage Vitals  Enc Vitals Group     BP 05/15/21 1547 (!) 162/122     Pulse Rate 05/15/21 1547 82     Resp 05/15/21 1547 18     Temp 05/15/21 1547 98.2 F (36.8 C)     Temp src --      SpO2 05/15/21 1547 97 %     Weight --      Height --      Head Circumference --      Peak Flow --      Pain Score 05/15/21 1546 8     Pain Loc --      Pain Edu? --      Excl. in GC? --    No data found.  Updated Vital Signs BP (!) 162/122   Pulse 82   Temp 98.2 F (36.8 C)   Resp 18   LMP 04/28/2021   SpO2 97%   Visual Acuity Right Eye Distance:   Left Eye Distance:   Bilateral Distance:    Right Eye Near:   Left Eye Near:    Bilateral Near:     Physical Exam Vitals reviewed.  Constitutional:      General: She is not in acute distress.    Appearance: Normal appearance. She is not ill-appearing.  HENT:     Head: Normocephalic and atraumatic.     Mouth/Throat:     Mouth: Mucous membranes are moist.     Comments: Moist mucous membranes Eyes:     Extraocular Movements: Extraocular movements intact.     Pupils: Pupils are equal, round, and reactive to light.  Cardiovascular:     Rate and Rhythm: Normal rate and regular rhythm.     Heart sounds: Normal heart sounds.  Pulmonary:     Effort: Pulmonary effort is normal.     Breath sounds: Normal breath sounds. No wheezing, rhonchi or rales.  Abdominal:     General: Bowel sounds are normal. There is no distension.     Palpations: Abdomen is soft. There is no mass.     Tenderness: There is no abdominal tenderness. There is no right CVA tenderness, left CVA tenderness, guarding  or rebound.  Skin:    General: Skin is warm.     Capillary Refill:  Capillary refill takes less than 2 seconds.     Comments: Good skin turgor  Neurological:     General: No focal deficit present.     Mental Status: She is alert and oriented to person, place, and time.  Psychiatric:        Mood and Affect: Mood normal.        Behavior: Behavior normal.     UC Treatments / Results  Labs (all labs ordered are listed, but only abnormal results are displayed) Labs Reviewed  POC URINE PREG, ED - Abnormal; Notable for the following components:      Result Value   Preg Test, Ur POSITIVE (*)    All other components within normal limits  POCT URINALYSIS DIPSTICK, ED / UC    EKG   Radiology No results found.  Procedures Procedures (including critical care time)  Medications Ordered in UC Medications - No data to display  Initial Impression / Assessment and Plan / UC Course  I have reviewed the triage vital signs and the nursing notes.  Pertinent labs & imaging results that were available during my care of the patient were reviewed by me and considered in my medical decision making (see chart for details).     This patient is a very pleasant 30 y.o. year old female presenting with abd pain, vaginal bleeding during 1st trimester pregnancy. U-preg confirms pregnancy today. Sent straight to MAU, she is in agreement. .   Final Clinical Impressions(s) / UC Diagnoses   Final diagnoses:  Abdominal pain during pregnancy in first trimester     Discharge Instructions      -I am concerned for a miscarriage given your abd pain and spotting. Please head straight to the [facility] hospital      ED Prescriptions   None    PDMP not reviewed this encounter.   Hazel Sams, PA-C 05/15/21 1655

## 2021-05-16 ENCOUNTER — Other Ambulatory Visit: Payer: Self-pay | Admitting: Advanced Practice Midwife

## 2021-05-16 ENCOUNTER — Telehealth: Payer: Self-pay

## 2021-05-16 DIAGNOSIS — O3680X0 Pregnancy with inconclusive fetal viability, not applicable or unspecified: Secondary | ICD-10-CM

## 2021-05-16 DIAGNOSIS — I10 Essential (primary) hypertension: Secondary | ICD-10-CM

## 2021-05-16 HISTORY — DX: Pregnancy with inconclusive fetal viability, not applicable or unspecified: O36.80X0

## 2021-05-16 LAB — GC/CHLAMYDIA PROBE AMP (~~LOC~~) NOT AT ARMC
Chlamydia: POSITIVE — AB
Comment: NEGATIVE
Comment: NORMAL
Neisseria Gonorrhea: NEGATIVE

## 2021-05-16 MED ORDER — LABETALOL HCL 300 MG PO TABS: 300.0000 mg | ORAL_TABLET | Freq: Two times a day (BID) | ORAL | 2 refills | Status: DC

## 2021-05-16 NOTE — Progress Notes (Signed)
Change BP med due to pregnancy from Norvasc to Labetalol

## 2021-05-16 NOTE — Telephone Encounter (Signed)
Transition Care Management Follow-up Telephone Call Date of discharge and from where: 05/15/2021-[facility]  How have you been since you were released from the hospital? Patient stated she is doing ok.  Any questions or concerns? No  Items Reviewed: Did the pt receive and understand the discharge instructions provided? Yes  Medications obtained and verified? Yes  Other? No  Any new allergies since your discharge? No  Dietary orders reviewed? No Do you have support at home? Yes   Home Care and Equipment/Supplies: Were home health services ordered? not applicable If so, what is the name of the agency? N/A  Has the agency set up a time to come to the patient's home? not applicable Were any new equipment or medical supplies ordered?  No What is the name of the medical supply agency? N/A Were you able to get the supplies/equipment? not applicable Do you have any questions related to the use of the equipment or supplies? No  Functional Questionnaire: (I = Independent and D = Dependent) ADLs: I  Bathing/Dressing- I  Meal Prep- I  Eating- I  Maintaining continence- I  Transferring/Ambulation- I  Managing Meds- I  Follow up appointments reviewed:  PCP Hospital f/u appt confirmed? No   Specialist Hospital f/u appt confirmed? Yes  Scheduled to see OBGYN on 05/18/2021 @ 10:30AM. Are transportation arrangements needed? No  If their condition worsens, is the pt aware to call PCP or go to the Emergency Dept.? Yes Was the patient provided with contact information for the PCP's office or ED? Yes Was to pt encouraged to call back with questions or concerns? Yes

## 2021-05-17 ENCOUNTER — Telehealth (HOSPITAL_COMMUNITY): Payer: Self-pay

## 2021-05-17 ENCOUNTER — Other Ambulatory Visit: Payer: Self-pay | Admitting: Family Medicine

## 2021-05-17 MED ORDER — AZITHROMYCIN 500 MG PO TABS: 1000.0000 mg | ORAL_TABLET | Freq: Once | ORAL | 0 refills | Status: DC

## 2021-05-17 MED ORDER — AZITHROMYCIN 500 MG PO TABS: 1000.0000 mg | ORAL_TABLET | Freq: Once | ORAL | 0 refills | Status: AC

## 2021-05-18 ENCOUNTER — Other Ambulatory Visit: Payer: Self-pay

## 2021-05-18 ENCOUNTER — Other Ambulatory Visit: Payer: Self-pay | Admitting: *Deleted

## 2021-05-18 ENCOUNTER — Other Ambulatory Visit: Payer: Medicaid Other

## 2021-05-18 DIAGNOSIS — O4690 Antepartum hemorrhage, unspecified, unspecified trimester: Secondary | ICD-10-CM

## 2021-05-19 LAB — BETA HCG QUANT (REF LAB): hCG Quant: 2171 m[IU]/mL

## 2021-05-21 ENCOUNTER — Other Ambulatory Visit: Payer: Self-pay | Admitting: Medical

## 2021-05-21 ENCOUNTER — Telehealth: Payer: Self-pay | Admitting: Family Medicine

## 2021-05-21 ENCOUNTER — Other Ambulatory Visit: Payer: Self-pay | Admitting: Obstetrics & Gynecology

## 2021-05-21 DIAGNOSIS — M25561 Pain in right knee: Secondary | ICD-10-CM | POA: Diagnosis not present

## 2021-05-21 DIAGNOSIS — G629 Polyneuropathy, unspecified: Secondary | ICD-10-CM | POA: Diagnosis not present

## 2021-05-21 DIAGNOSIS — O4690 Antepartum hemorrhage, unspecified, unspecified trimester: Secondary | ICD-10-CM

## 2021-05-21 DIAGNOSIS — M542 Cervicalgia: Secondary | ICD-10-CM | POA: Diagnosis not present

## 2021-05-21 DIAGNOSIS — A749 Chlamydial infection, unspecified: Secondary | ICD-10-CM

## 2021-05-21 DIAGNOSIS — O3680X0 Pregnancy with inconclusive fetal viability, not applicable or unspecified: Secondary | ICD-10-CM

## 2021-05-21 DIAGNOSIS — M545 Low back pain, unspecified: Secondary | ICD-10-CM | POA: Diagnosis not present

## 2021-05-21 DIAGNOSIS — N914 Secondary oligomenorrhea: Secondary | ICD-10-CM | POA: Diagnosis not present

## 2021-05-21 DIAGNOSIS — Z79899 Other long term (current) drug therapy: Secondary | ICD-10-CM | POA: Diagnosis not present

## 2021-05-21 DIAGNOSIS — Z3490 Encounter for supervision of normal pregnancy, unspecified, unspecified trimester: Secondary | ICD-10-CM | POA: Diagnosis not present

## 2021-05-21 MED ORDER — AZITHROMYCIN 250 MG PO TABS: 1000.0000 mg | ORAL_TABLET | Freq: Once | ORAL | 0 refills | Status: AC

## 2021-05-21 NOTE — Telephone Encounter (Signed)
Per Dr Macon Large, Patient had appropriate rise of HCG from 1020 to 2171  from 05/15/21 to 05/18/21. Ultrasound on 05/15/21 just showed IUGS.  This is reassuring. Needs repeat ultrasound in next week to document viability, future order placed and needs to be scheduled. In the meantime, please give bleeding and pain precautions.  Please call to inform patient of results and recommendations.    Called patient and informed her of bhcg results. Scheduled ultrasound for 11/29 @ 2pm. Advised if she experiences significant pain or bleeding to call and let us know if the office is open or if we're closed to go to MAU for evaluation. Patient verbalized understanding.

## 2021-05-21 NOTE — Telephone Encounter (Signed)
Patient want to know if she need to come in for more lab work and also have questions about an ultrasound

## 2021-05-21 NOTE — Progress Notes (Signed)
Patient had appropriate rise of HCG from 1020 to 2171  from 05/15/21 to 05/18/21. Ultrasound on 05/15/21 just showed IUGS.  This is reassuring. Needs repeat ultrasound in next week to document viability, future order placed and needs to be scheduled.   In the meantime, please give bleeding and pain precautions.  Please call to inform patient of results and recommendations.    Jaynie Collins, MD

## 2021-05-25 DIAGNOSIS — Z79899 Other long term (current) drug therapy: Secondary | ICD-10-CM | POA: Diagnosis not present

## 2021-05-29 ENCOUNTER — Other Ambulatory Visit: Payer: Medicaid Other

## 2021-05-30 ENCOUNTER — Ambulatory Visit: Payer: Medicaid Other

## 2021-05-31 ENCOUNTER — Ambulatory Visit: Payer: Medicaid Other

## 2021-06-05 DIAGNOSIS — Z0000 Encounter for general adult medical examination without abnormal findings: Secondary | ICD-10-CM | POA: Diagnosis not present

## 2021-06-05 DIAGNOSIS — Z3200 Encounter for pregnancy test, result unknown: Secondary | ICD-10-CM | POA: Diagnosis not present

## 2021-06-07 ENCOUNTER — Ambulatory Visit
Admission: RE | Admit: 2021-06-07 | Discharge: 2021-06-07 | Disposition: A | Discharge status: Home / Self Care | Payer: Medicaid Other | Source: Ambulatory Visit | Attending: Obstetrics & Gynecology | Admitting: Obstetrics & Gynecology

## 2021-06-07 ENCOUNTER — Other Ambulatory Visit: Payer: Self-pay

## 2021-06-07 DIAGNOSIS — O3680X0 Pregnancy with inconclusive fetal viability, not applicable or unspecified: Secondary | ICD-10-CM | POA: Insufficient documentation

## 2021-06-07 DIAGNOSIS — O4690 Antepartum hemorrhage, unspecified, unspecified trimester: Secondary | ICD-10-CM | POA: Insufficient documentation

## 2021-06-07 DIAGNOSIS — Z3A08 8 weeks gestation of pregnancy: Secondary | ICD-10-CM | POA: Diagnosis not present

## 2021-06-08 ENCOUNTER — Telehealth: Payer: Self-pay

## 2021-06-08 DIAGNOSIS — O3680X0 Pregnancy with inconclusive fetal viability, not applicable or unspecified: Secondary | ICD-10-CM

## 2021-06-08 NOTE — Telephone Encounter (Addendum)
-----   Message from Tereso Newcomer, MD sent at 06/07/2021  3:05 PM EST ----- Viable intrauterine pregnancy, patient can start prenatal care as recommended   Results reviewed with patient. Patient would like to schedule prenatal care with our office. Front office notified to schedule.

## 2021-06-19 ENCOUNTER — Telehealth (INDEPENDENT_AMBULATORY_CARE_PROVIDER_SITE_OTHER): Payer: Medicaid Other

## 2021-06-19 DIAGNOSIS — O09899 Supervision of other high risk pregnancies, unspecified trimester: Secondary | ICD-10-CM

## 2021-06-19 DIAGNOSIS — O99210 Obesity complicating pregnancy, unspecified trimester: Secondary | ICD-10-CM

## 2021-06-19 DIAGNOSIS — O0990 Supervision of high risk pregnancy, unspecified, unspecified trimester: Secondary | ICD-10-CM

## 2021-06-19 DIAGNOSIS — O10919 Unspecified pre-existing hypertension complicating pregnancy, unspecified trimester: Secondary | ICD-10-CM

## 2021-06-19 DIAGNOSIS — Z3A00 Weeks of gestation of pregnancy not specified: Secondary | ICD-10-CM

## 2021-06-19 DIAGNOSIS — O3680X0 Pregnancy with inconclusive fetal viability, not applicable or unspecified: Secondary | ICD-10-CM

## 2021-06-19 MED ORDER — GOJJI WEIGHT SCALE MISC: 1.0000 | Freq: Every day | 0 refills | Status: DC

## 2021-06-19 MED ORDER — BLOOD PRESSURE MONITORING DEVI: 1.0000 | 0 refills | Status: DC

## 2021-06-19 MED ORDER — BLOOD PRESSURE MONITORING DEVI
1.0000 | 0 refills | Status: DC
Start: 2021-06-19 — End: 2023-03-10

## 2021-06-19 NOTE — Progress Notes (Signed)
Pt states that she is in Pain Management, was taking Oxycodone, 6 per day and Gabapentin 100 mg, 1 tid.She states has not taken the Oxycodone for 3 weeks now.Advised that I would talk with a Provider to make sure it's still ok for her to take. Spoke with Dr.Eckstat and he stated because she has been taking Oxycodone for so long it would not be good for her to just stop taking, most likely she would need to be weaned off of it. She will need to have that conversation with her Dr at her visit, the Gabapentin 100 mg is fine to take, no reports stating that she can't.

## 2021-06-19 NOTE — Patient Instructions (Signed)
AREA PEDIATRIC/FAMILY PRACTICE PHYSICIANS  [place] ([address]) [facility] Chambliss, MD; Eniola, MD; Hale, MD; Hensel, MD; McDiarmid, MD; McIntyer, MD; Shantel Helwig, MD; Walden, MD [address] [place], [place] [address] [phone] Mon-Fri 8:30-12:30, 1:30-5:00 Providers come to see babies at [facility] Accepting Medicaid [facility] providers who accept newborns: Koirala, MD; Morrow, MD; Wolters, MD [address], [place], [place] [address] [phone] Mon-Fri 8:00-5:30 Babies seen by providers at [facility] Does NOT accept Medicaid Please call early in hospitalization for appointment (limited availability)  [facility] Mulberry, MD [address] [place], [place] [address] [phone] Mon, Tue, Thur, Fri 8:30-5:00, Wed 10:00-7:00 (closed 1-2pm) Babies seen by [facility] providers Accepting Medicaid Rubin - Pediatrician Rubin, MD [address], [place], [place] [address] [phone] Mon-Fri 8:30-5:00, Sat 8:30-12:00 Provider comes to see babies at [facility] Accepting Medicaid Must have been referred from current patients or contacted office prior to delivery [facility] ([facility]) Brown, MD; Chandler, MD; Ettefagh, MD; Grant, MD; Lester, MD; McCormick, MD; McQueen, MD; Prose, MD; Simha, MD; Stanley, MD; Stryffeler, NP; Tebben, NP [address], [place], [place] [address] [phone] Mon, Tue, Thur, Fri 8:30-5:30, Wed 9:30-5:30, Sat 8:30-12:30 Babies seen by [facility] providers Accepting Medicaid Only accepting infants of first-time parents or siblings of current patients Hospital discharge coordinator will make follow-up appointment Jack Amos [address], [place], [place]  [address] [phone]   Fax - [phone] [facility] [address], [place], [place]  [address] Phone - [phone]   Fax - [phone] Shilpa Gosrani [address], [place], [place]  [address] [phone]  [place] ([address]) [facility] Bates, MD; Brassfield, MD; Cooper, Cox, MD; MD; Davis, MD; Dovico, MD; Ettefaugh, MD; Little, MD; Lowe, MD; Keiffer, MD; Melvin, MD; Sumner, MD; Williams, MD [address], [place], [place] [address] [phone] Mon-Fri 8:30-5:00 (extended evenings Mon-Thur as needed), Sat-Sun 10:00-1:00 Providers come to see babies at [facility] Accepting Medicaid for families of first-time babies and families with all children in the household age 3 and under. Must register with office prior to making appointment (M-F only). [facility] Henson, NP; Knapp, MD; Lalonde, MD; Tysinger, PA [address] [place], [place] [address] [phone] Mon-Fri 8:00-5:00 Babies seen by providers at [facility] Does NOT accept Medicaid/Commercial Insurance Only [facility] ([facility])  Artis, MD; Barnes, MD; Bratton, MD; Coccaro, MD; Lockett Gardner, MD; Kramer, MD; Marshall, MD; Netherton, MD; Poleto, MD; Skinner, MD [address] [place], [place] [address] [phone] Mon-Fri 8:30-5:30, Sat (Oct.-Mar.) 9:00-1:00 Babies seen by providers at [facility] Accepting Medicaid  [place] ([address]) [facility] Reid, MD; Warner, MD [address], [place], [place] [address] [phone] Mon-Fri 8:30-5:00, Sat 8:30-12:00 Providers come to see babies at [facility] Does NOT accept Medicaid [facility] Becker, PA; Hagler, MD; Scifres, PA; Sun, MD; Swayne, MD [address], [place],  [address] [phone] Mon-Fri 8:00-5:00 Babies seen by providers at [facility] Does NOT accept Medicaid Only accepting babies of parents who  are patients Please call early in hospitalization for appointment (limited availability) [facility] Clark, MD; Frye, MD; Kelleher, MD; Mack, NP; Miller, MD; O'Keller, MD; Patterson, NP; Pudlo, MD; Puzio, MD; Thomas, MD; Tucker, MD; Twiselton, MD [address], [place], [place] [address] [phone] Mon-Fri 8:00-5:00, Sat 9:00-12:00 Providers come to see babies at [facility] Does NOT accept Medicaid  [place] ([address]) [facility] providers accepting new patients: Brake, NP; Wharton, PA [address], [place], [place] [address] [phone] Mon-Fri 8:00-5:00 Babies seen by providers at [facility] Does NOT accept Medicaid Only accepting babies of parents who are patients Please call early in hospitalization for appointment (limited availability) [facility] Gay, MD; Quinlan, MD [address] [place], [place] [address] [phone] (press 1 to schedule appointment) Mon-Fri 8:00-5:00 Providers come to see babies at [facility] Does NOT accept Medicaid [facility] Mazer, MD [address] [place], [place] [address] [phone] Mon-Fri 8:30-5:00 (lunch 12:30-1:00), extended hours by appointment only Wed 5:00-6:30 Babies seen by [facility] providers Accepting Medicaid [facility] Banks, MD; Jordan, MD; Koberlein, MD [address], [place], [place] [address] [phone] Mon-Fri 8:00-5:00 Babies seen by [facility] providers Does NOT accept Medicaid [facility] Parker, MD; Hunter, MD; Wallace, DO [address] [place], [place] [address] [phone] Mon-Fri 8:00-5:00 Babies seen by [facility] providers Does NOT accept Medicaid [facility] [place], PA; Brecken, PA; Christy, NP; Dees, MD; DeClaire, MD; DeWeese, MD; Hansen, NP; Mills, NP; Parrish, NP; Smoot, NP; Summer, MD; Vapne,  MD [address] [place], [place] [address] [phone] Mon-Fri 8:30-5:00, Sat 10:00-1:00 Providers come to see babies at [facility] Does NOT accept Medicaid Free prenatal information session Tuesdays at 4:45pm [facility] Bouska, MD; [place], PA; Jeffery, PA; Weber, PA [address] [place] [address] [phone] Mon-Fri 7:30-5:30 Babies seen by [facility] providers [employer] [address], [place], [place]  [address] [phone]   Fax - [phone]  [place] ([address] & [address]) [facility] [place] [address] [place], [place] [address] [phone] Mon-Thur 8:00-6:00 Providers come to see babies at [facility] Accepting Medicaid [facility] Anderson, NP; Badger, MD; Beal, PA; Spencer, PA [address] [place], [place] [address] [phone] Mon-Thur 7:30-7:30, Fri 7:30-4:30 Babies seen by [facility] providers Accepting Medicaid [facility] Agbuya, MD; Klett, NP; Romgoolam, MD [address], [place], [place] [address] [phone] Mon-Fri 8:30-5:00, Sat 8:30-12:00 Providers come to see babies at [facility] Accepting Medicaid Must have "Meet & Greet" appointment at office prior to delivery [facility] ([facility]) McCord, MD; Wallace, MD; Wood, MD [address], [place], [place] [address] [phone] Mon-Wed 8:00-6:00, Thur-Fri 8:00-5:00, Sat 9:00-12:00 Providers come to see babies at [facility] Does NOT accept Medicaid Only accepting siblings of current patients [facility]  [address], [place], [place]  [address] [phone]   Fax - [phone] [facility] [address], [place], [place]  [address] [phone]   Fax -  [phone]  [place] ([address] & [address]) [facility] Cirigliano, DO; Matthews, DO [address] , [place] [address] [phone] Mon-Fri 7:00-5:00 Babies seen by [facility] providers Does NOT accept Medicaid [facility] Briscoe, MD; Howley, PA; Moreira, PA [address], [place], [place] [address] [phone] Mon-Fri 8:00-5:00 Babies seen by [facility] providers Accepting Medicaid [facility] Boyd, MD; Church, PA; Jones, NP; Osborn, PA [address], ,  [address] [phone] Mon-Fri 8:00-5:00 Babies seen by providers at [facility] Accepting Medicaid  [place] ([address]) [facility] [place] Wendling, DO [address] [place], [place] [address] [phone] Mon-Fri 8:00-5:00 Babies seen by [facility] providers Does NOT accept Medicaid Limited availability, please call early in hospitalization to schedule follow-up [facility] Calderon, PA; Cummings, MD; Dillard, MD; Martin, PA; Olson, MD; VanDeven, PA [address], [place], [place] [address] [phone] Mon-Fri 8:30-5:00, Sat 9:00-12:00 Babies seen by providers at [facility] Accepting Medicaid Please register online then schedule online or call office [email or web address] ([facility]) Hunter, NP; Kumar, MD; Martin Rogers, PA [address], [place], [place] [address] [phone] Mon-Fri 8:00-5:00 Babies seen by providers at [facility] Accepting Medicaid [facility] ([facility]) Adrian, MD; Kristi Fleenor, NP; West, MD [address], [place], [place] [address] [phone] Mon-Fri 8:00-5:30, Sat&Sun by appointment (phones open at  8:30) Babies seen by [facility] providers Accepting Medicaid Must be a first-time baby or sibling of current patient [facility] [place]  [address], [place], [place]  [address] [phone]   Fax - [phone]  [place] ([address]) [facility] Brown, PA; Cowen, PA; Rice, MD; Helton, PA; Spry, MD [address] [place], [place] [address] [phone] Mon-Thur 8:00-7:00, Fri 8:00-5:00, Sat 8:00-12:00, Sun 9:00-12:00 Babies seen by [facility] providers Accepting Medicaid [facility] Coe-Goins, MD; Marshall, MD; Pierre-Louis, MD [address] [place] [address], [place], [place] [address] [phone] Mon-Thur 8:00-5:00 Babies seen by providers at [facility] Accepting Medicaid [facility] Bratton, MD; Coe-Goins, MD; Hayes, MD; Lewis, MD; List, MD; Lott, MD; Marshall, MD; Moran, MD; O'Pershing Skidmore, MD; Pierre-Louis, MD; Pitonzo, MD; Scholer, MD; Spangle, MD [address] [place], [place] [address] [phone] Mon-Fri 8:00-5:30, Sat (Oct.-Mar.) 9:00-1:00 Babies seen by providers at [facility] Accepting Medicaid Must fill out new patient packet, available online at [email or web address] [facility] ([facility]) Friddle, NP; Harris, NP; Kelly, NP; Logan, MD; Melvin, PA; Poth, MD; Ramadoss, MD; Stanton, NP [address], [place], [place] [address] [phone] Mon-Thur 8:00-5:30, Fri 8:00-5:00 Babies seen by providers at [facility] Accepting Medicaid  [place] ([address]) [facility] Dixon, PA; [place], MD; Pickard, MD; Tapia, PA [address], [place], [place] [address] [phone] Mon-Fri 8:00-5:00 Babies seen by providers at [facility] Accepting Medicaid   [place] ([address]) [facility] [place] Masneri, DO; Meyers, MD; Nelson, PA [address], [place], [place] [address] [phone] Mon-Fri 8:00-5:00 Babies seen by providers at [facility] Does NOT accept Medicaid Limited appointment availability, please call early in hospitalization  [facility] [place] Kunedd, DO; McGowen, MD [address], [place], [place] [address] [phone] Mon-Fri 8:00-5:00 Babies seen by [facility] providers Does NOT accept Medicaid [facility] [place] Cameron, MD; MacDonald, MD; Michaels, PA; Nayak, MD [address] [place] [address], [place],  [address] [phone] Mon-Fri 8:00-5:00 After hours clinic ([address] [place],  [address]) [phone] Mon-Fri 5:00-8:00, Sat 12:00-6:00, Sun 10:00-4:00 Babies seen by [facility] providers Accepting Medicaid [facility] [place] [address], [place], [place]  [address] [phone]   Fax - [phone]  [place] ([address]) [facility] [place] [facility] Andy, MD [address], [place], [place] [address] [phone] Mon-Fri 8:00-5:00 Babies seen by [facility] providers Does NOT accept Medicaid [facility] [place] ([facility] [place]) Eksir, MD [address], [place], [place] [address] [phone] Mon-Thur 8:00-7:00, Fri 8:00-5:00, Sat 8:00-12:00 Babies seen by providers at [facility] Accepting Medicaid - but does not have vaccinations in office (must be received elsewhere) Limited availability, please call early in hospitalization  [place] ([address]) [facility]  Charlene Flemming, MD [address], [place] [address] [phone]  Fax [phone]  [facility]  Kimberly Newton, MD, Annamarie Streilein, PA, Carla Hampton, PA [address] [place], [place]  [address] [phone] [facility]  [address], [place], [place] [address] [phone] [address], [place], [place] [address] [phone] (West Office)  [facility] [address], [place], [place] [address] [phone] [facility] [address], [place], [place] [address] [phone] [facility] [address], [place], [place] [address] [phone] [facility] [address], [place], [place] [address] [phone] [facility] [address], [place], [place] [address] [phone] [facility] [address], [place], [place] [address] [phone] [facility]  [address], [place], [place] [address] [phone] Dr[facility] [address], [place], [place] [address] [phone] [facility] [address], [place], [place] [address] [phone] [facility] [address], [place], [place] [address] [phone]   At our [facility], we work as an integrated team, providing care to address both physical and emotional health. Your medical provider may refer you to see our Behavioral Health Clinician (BHC) on the same day you see your medical provider, as availability permits; often scheduled virtually at your convenience.  Our BHC is available to all patients, visits generally last between 20-30 minutes, but can be longer or shorter, depending on patient need. The BHC offers help with stress management, coping with symptoms of depression and anxiety, major life changes , sleep issues, changing risky behavior, grief and loss, life stress, working on personal life goals, and  behavioral health issues, as these all affect your overall health and wellness.  The BHC is NOT available for the following: FMLA paperwork, court-ordered evaluations, specialty assessments (custody or disability), letters to employers, or  obtaining certification for an emotional support animal. The BHC does not provide long-term therapy. You have the right to refuse integrated behavioral health services, or to reschedule to see the BHC at a later date.  Confidentiality exception: If it is suspected that a child or disabled adult is being abused or neglected, we are required by law to report that to either Child Protective Services or Adult Protective Services.  If you have a diagnosis of Bipolar affective disorder, Schizophrenia, or recurrent Major depressive disorder, we will recommend that you establish care with a psychiatrist, as these are lifelong, chronic conditions, and we want your overall emotional health and medications to be more closely monitored. If you anticipate needing extended maternity leave due to mental health issues postpartum, it it recommended you inform your medical provider, so we can put in a referral to a psychiatrist as soon as possible. The BHC is unable to recommend an extended maternity leave for mental health issues. Your medical provider   or BHC may refer you to a therapist for ongoing, traditional therapy, or to a psychiatrist, for medication management, if it would benefit your overall health. Depending on your insurance, you may have a copay or be charged a deductible, depending on your insurance, to see the BHC. If you are uninsured, it is recommended that you apply for financial assistance. (Forms may be requested at the front desk for in-person visits, via MyChart, or request a form during a virtual visit).  If you see the BHC more than 6 times, you will have to complete a comprehensive clinical assessment interview with the BHC to resume integrated services.  For virtual visits with the BHC, you must be physically in the state of [place] at the time of the visit. For example, if you live in [place], you will have to do an in-person visit with the BHC, and your out-of-state insurance may not cover  behavioral health services in [place]. If you are going out of the state or country for any reason, the BHC may see you virtually when you return to [place], but not while you are physically outside of [place].   

## 2021-06-19 NOTE — Progress Notes (Signed)
New OB Intake  I connected with  Shirley [name] on 06/19/21 at  3:15 PM EST by telephone Video Visit and verified that I am speaking with the correct person using two identifiers. Nurse is located at [facility] and pt is located at home.  I discussed the limitations, risks, security and privacy concerns of performing an evaluation and management service by telephone and the availability of in person appointments. I also discussed with the patient that there may be a patient responsible charge related to this service. The patient expressed understanding and agreed to proceed.  I explained I am completing New OB Intake today. We discussed her EDD of 01/16/22 that is based on LMP of 04/11/21. Pt is G5/P3. I reviewed her allergies, medications, Medical/Surgical/OB history, and appropriate screenings. I informed her of [facility] services. Based on history, this is a/an  pregnancy complicated by hypertension .   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) 12/02/2006   Primary hypertension 08/28/2006   [place] 08/28/2006    Concerns addressed today  Delivery Plans:  Plans to deliver at [facility].    MyChart/Babyscripts MyChart access verified. I explained pt will have some visits in office and some virtually. Babyscripts instructions given and order placed. Patient verifies receipt of registration text/e-mail. Account successfully created and app downloaded.  Blood Pressure Cuff  Blood pressure cuff ordered for patient to pick-up from [employer]. Explained after first prenatal appt pt will check weekly and document in Babyscripts.  Weight scale: Patient    have weight scale. Weight scale ordered for patient to pick up form [facility].   Anatomy US Explained first scheduled [place] will be around 19 weeks. Anatomy US scheduled for 08/22/21 at 01:30p. Pt notified to arrive at 01:15p.  Labs Discussed Avelina Laine genetic screening with patient. Would like both Panorama and Horizon drawn at new OB visit. Routine prenatal labs needed.  Covid Vaccine Patient has not covid vaccine.   Centering in Pregnancy Candidate?  If yes, offer as possibility  Mother/ Baby Dyad Candidate?    If yes, offer as possibility  Informed patient of [facility] website  and placed link in her AVS.   Social Determinants of Health Food Insecurity: Patient expresses food insecurity. Food Market information given to patient; explained patient may visit at the end of first OB appointment. WIC Referral: Patient is interested in referral to [facility].  Transportation: Patient denies transportation needs. Childcare: Discussed no children allowed at ultrasound appointments. Offered childcare services; patient declines childcare services at this time.  Send link to Pregnancy Navigators   Placed OB Box on problem list and updated  First visit review I reviewed new OB appt with  pt. I explained she will have a pelvic exam, ob bloodwork with genetic screening, and PAP smear. Explained pt will be seen by Dr.Das at first visit; encounter routed to appropriate provider. Explained that patient will be seen by  pregnancy navigator following visit with provider. [facility] information placed in AVS.   Henrietta Dine, CMA 06/19/2021  3:34 PM

## 2021-06-26 DIAGNOSIS — M542 Cervicalgia: Secondary | ICD-10-CM | POA: Diagnosis not present

## 2021-06-26 DIAGNOSIS — M25561 Pain in right knee: Secondary | ICD-10-CM | POA: Diagnosis not present

## 2021-06-26 DIAGNOSIS — G629 Polyneuropathy, unspecified: Secondary | ICD-10-CM | POA: Diagnosis not present

## 2021-06-26 DIAGNOSIS — Z79899 Other long term (current) drug therapy: Secondary | ICD-10-CM | POA: Diagnosis not present

## 2021-06-26 DIAGNOSIS — M545 Low back pain, unspecified: Secondary | ICD-10-CM | POA: Diagnosis not present

## 2021-06-28 DIAGNOSIS — Z79899 Other long term (current) drug therapy: Secondary | ICD-10-CM | POA: Diagnosis not present

## 2021-07-10 ENCOUNTER — Ambulatory Visit: Payer: Medicaid Other | Admitting: Family Medicine

## 2021-07-10 NOTE — Progress Notes (Signed)
Opened in error. No show for appt

## 2021-07-25 DIAGNOSIS — M542 Cervicalgia: Secondary | ICD-10-CM | POA: Diagnosis not present

## 2021-07-25 DIAGNOSIS — Z79899 Other long term (current) drug therapy: Secondary | ICD-10-CM | POA: Diagnosis not present

## 2021-07-25 DIAGNOSIS — G629 Polyneuropathy, unspecified: Secondary | ICD-10-CM | POA: Diagnosis not present

## 2021-07-25 DIAGNOSIS — M545 Low back pain, unspecified: Secondary | ICD-10-CM | POA: Diagnosis not present

## 2021-07-25 DIAGNOSIS — M25561 Pain in right knee: Secondary | ICD-10-CM | POA: Diagnosis not present

## 2021-07-27 DIAGNOSIS — Z79899 Other long term (current) drug therapy: Secondary | ICD-10-CM | POA: Diagnosis not present

## 2021-08-20 DIAGNOSIS — L732 Hidradenitis suppurativa: Secondary | ICD-10-CM | POA: Diagnosis not present

## 2021-08-20 DIAGNOSIS — Z131 Encounter for screening for diabetes mellitus: Secondary | ICD-10-CM | POA: Diagnosis not present

## 2021-08-20 DIAGNOSIS — I10 Essential (primary) hypertension: Secondary | ICD-10-CM | POA: Diagnosis not present

## 2021-08-20 DIAGNOSIS — M79644 Pain in right finger(s): Secondary | ICD-10-CM | POA: Diagnosis not present

## 2021-08-20 DIAGNOSIS — R5383 Other fatigue: Secondary | ICD-10-CM | POA: Diagnosis not present

## 2021-08-20 DIAGNOSIS — Z1159 Encounter for screening for other viral diseases: Secondary | ICD-10-CM | POA: Diagnosis not present

## 2021-08-20 DIAGNOSIS — Z3200 Encounter for pregnancy test, result unknown: Secondary | ICD-10-CM | POA: Diagnosis not present

## 2021-08-20 DIAGNOSIS — E8881 Metabolic syndrome: Secondary | ICD-10-CM | POA: Diagnosis not present

## 2021-08-20 DIAGNOSIS — M545 Low back pain, unspecified: Secondary | ICD-10-CM | POA: Diagnosis not present

## 2021-08-20 DIAGNOSIS — Z13220 Encounter for screening for lipoid disorders: Secondary | ICD-10-CM | POA: Diagnosis not present

## 2021-08-21 DIAGNOSIS — G629 Polyneuropathy, unspecified: Secondary | ICD-10-CM | POA: Diagnosis not present

## 2021-08-21 DIAGNOSIS — M25561 Pain in right knee: Secondary | ICD-10-CM | POA: Diagnosis not present

## 2021-08-21 DIAGNOSIS — N911 Secondary amenorrhea: Secondary | ICD-10-CM | POA: Diagnosis not present

## 2021-08-21 DIAGNOSIS — Z79899 Other long term (current) drug therapy: Secondary | ICD-10-CM | POA: Diagnosis not present

## 2021-08-21 DIAGNOSIS — M542 Cervicalgia: Secondary | ICD-10-CM | POA: Diagnosis not present

## 2021-08-21 DIAGNOSIS — M545 Low back pain, unspecified: Secondary | ICD-10-CM | POA: Diagnosis not present

## 2021-08-22 ENCOUNTER — Ambulatory Visit: Payer: Medicaid Other | Attending: Family Medicine

## 2021-08-22 ENCOUNTER — Ambulatory Visit: Payer: Medicaid Other

## 2021-08-23 DIAGNOSIS — Z79899 Other long term (current) drug therapy: Secondary | ICD-10-CM | POA: Diagnosis not present

## 2021-08-27 DIAGNOSIS — I10 Essential (primary) hypertension: Secondary | ICD-10-CM | POA: Diagnosis not present

## 2021-08-27 DIAGNOSIS — H538 Other visual disturbances: Secondary | ICD-10-CM | POA: Diagnosis not present

## 2021-09-04 ENCOUNTER — Encounter: Payer: Self-pay | Admitting: Orthopedic Surgery

## 2021-09-04 ENCOUNTER — Ambulatory Visit (INDEPENDENT_AMBULATORY_CARE_PROVIDER_SITE_OTHER): Payer: Medicaid Other

## 2021-09-04 ENCOUNTER — Other Ambulatory Visit: Payer: Self-pay

## 2021-09-04 ENCOUNTER — Ambulatory Visit: Payer: Medicaid Other | Admitting: Orthopedic Surgery

## 2021-09-04 ENCOUNTER — Ambulatory Visit (INDEPENDENT_AMBULATORY_CARE_PROVIDER_SITE_OTHER): Payer: Medicaid Other | Admitting: Orthopedic Surgery

## 2021-09-04 VITALS — BP 192/137 | HR 71 | Ht 67.0 in | Wt 301.0 lb

## 2021-09-04 DIAGNOSIS — M79645 Pain in left finger(s): Secondary | ICD-10-CM | POA: Diagnosis not present

## 2021-09-04 NOTE — Progress Notes (Signed)
? ?Office Visit Note ?  ?Patient: Shirley [name]           ?Date of Birth: February 07, 1991           ?MRN: [id number] ?Visit Date: 09/04/2021 ?             ?Requested by: Jackelyn Poling, DO ?[address] ?[place],  [place] [address] ?PCP: Jackelyn Poling, DO ? ? ?Assessment & Plan: ?Visit Diagnoses:  ?1. Pain in left finger(s)   ? ? ?Plan: We reviewed her x-ray which does not demonstrate any acute or chronic bony injury.  There are no degenerative changes with a well-maintained and concentric joint space.  We discussed that injuries to the PIP joint result and swelling to take some time to resolve.  The swelling of the ulnar aspect of the index PIP joint suggestive of an ulnar collateral ligament injury.  There is no instability on exam today.  Discussed daily home exercises to maintain and improve PIP range of motion.  She was given some Coban to use for edema control.  I can see her back as needed. ? ?Follow-Up Instructions: No follow-ups on file.  ? ?Orders:  ?Orders Placed This Encounter  ?Procedures  ? XR Finger Index Left  ? ?No orders of the defined types were placed in this encounter. ? ? ? ? Procedures: ?No procedures performed ? ? ?Clinical Data: ?No additional findings. ? ? ?Subjective: ?Chief Complaint  ?Patient presents with  ? Left Index Finger - Pain  ?  Injured a while ago and then reinjured playing with son, RIGHT HANDED, pain is 7/10, + swelling, thought she had broke it before, can't bend it all the way  ? ? ?This is a 31 year old right-hand-dominant female presents with left index finger pain and swelling at the PIP joint.  She injured this finger over a year ago but then recently reinjured it a month ago.  She jammed her finger while playing with her son recently.  Since then she has noticed swelling of the index PIP joint with pain with range of motion.  She notes that the joint feels stiff with flexion.  She did not have any swelling with her previous injury but has noticed that her PIP joint has  gotten swollen since the reinjury a month ago.  She denies pain elsewhere in the hand. ? ? ?Review of Systems ? ? ?Objective: ?Vital Signs: BP (!) 192/137 (BP Location: Left Arm, Patient Position: Sitting)   Pulse 71   Ht 5\' 7"  (1.702 m)   Wt (!) 301 lb (136.5 kg)   LMP 04/28/2021   BMI 47.14 kg/m?  ? ?Physical Exam ?Constitutional:   ?   Appearance: Normal appearance.  ?Cardiovascular:  ?   Rate and Rhythm: Normal rate.  ?   Pulses: Normal pulses.  ?Pulmonary:  ?   Effort: Pulmonary effort is normal.  ?Skin: ?   General: Skin is warm and dry.  ?   Capillary Refill: Capillary refill takes less than 2 seconds.  ?Neurological:  ?   Mental Status: She is alert.  ? ? ?Left Hand Exam  ? ?Tenderness  ?Left hand tenderness location: Mild TTP at ulnar side of index finger PIP joint.  ? ?Other  ?Erythema: absent ?Sensation: normal ?Pulse: present ? ?Comments:  Index finger PIP ROM 0-100 degrees.  No malrotation.  Mild to moderate ulnar sided swelling.  ? ? ? ? ?Specialty Comments:  ?No specialty comments available. ? ?Imaging: ?No results found. ? ? ?  PMFS History: ?Patient Active Problem List  ? Diagnosis Date Noted  ? Supervision of high risk pregnancy, antepartum 06/19/2021  ? Pregnancy of unknown anatomic location 05/16/2021  ? Assistance needed with transportation 11/22/2019  ? Low HDL (under 40) 10/05/2019  ? Vitamin D deficiency 10/05/2019  ? Elevated alkaline phosphatase level 10/05/2019  ? Excessive daytime sleepiness 10/05/2019  ? Anxiety and depression 10/05/2019  ? Neck pain 07/16/2019  ? Trapezius muscle spasm 02/25/2019  ? Acute pain of both shoulders 02/25/2019  ? Finger pain, right 02/13/2019  ? Disorder of hip joint 02/16/2018  ? Chronic low back pain 01/28/2018  ? Osteoarthritis of both knees 09/29/2015  ? 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  ? Low back pain with right-sided sciatica 09/15/2015  ? Essential hypertension   ? Class 3 severe obesity with serious comorbidity and  body mass index (BMI) of 50.0 to 59.9 in adult [facility]) 12/02/2006  ? Primary hypertension 08/28/2006  ? [place] 08/28/2006  ? ?Past Medical History:  ?Diagnosis Date  ? Anxiety   ? Arthritis   ? Back pain   ? Chest pain   ? Constipation   ? Depression   ? Edema, lower extremity   ? GERD (gastroesophageal reflux disease)   ? Hip dysplasia   ? Hypertension   ? Joint pain   ? Obesity   ? Osteoarthritis   ? Rheumatoid arthritis (HCC)   ? Sciatica   ? Shortness of breath   ? Snapping hip syndrome   ?  ?Family History  ?Problem Relation Age of Onset  ? Hypertension Mother   ? Depression Mother   ? Anxiety disorder Mother   ? Obesity Mother   ? Diabetes Maternal Grandmother   ? Diabetes Paternal Grandmother   ? Stroke Paternal Grandmother   ? Hypertension Father   ?  ?Past Surgical History:  ?Procedure Laterality Date  ? TOOTH EXTRACTION N/A 10/22/2013  ? Procedure: DENTAL EXTRACTIONS TEETH #1, 16, 17, 32;  Surgeon: Georgia Lopes, DDS;  Location: [facility] OR;  Service: Oral Surgery;  Laterality: N/A;  ? ?Social History  ? ?Occupational History  ? Occupation: stay at home mom  ?Tobacco Use  ? Smoking status: Former  ?  Packs/day: 0.50  ?  Types: Cigarettes  ?  Quit date: 01/29/2017  ?  Years since quitting: 4.6  ? Smokeless tobacco: Never  ?Substance and Sexual Activity  ? Alcohol use: No  ?  Alcohol/week: 0.0 standard drinks  ?  Comment: rare  ? Drug use: No  ?  Types: Marijuana  ?  Comment: daily usage  ? Sexual activity: Yes  ?  Birth control/protection: None  ? ? ? ? ? ? ?

## 2021-09-21 DIAGNOSIS — M25561 Pain in right knee: Secondary | ICD-10-CM | POA: Diagnosis not present

## 2021-09-21 DIAGNOSIS — Z79899 Other long term (current) drug therapy: Secondary | ICD-10-CM | POA: Diagnosis not present

## 2021-09-21 DIAGNOSIS — G629 Polyneuropathy, unspecified: Secondary | ICD-10-CM | POA: Diagnosis not present

## 2021-09-21 DIAGNOSIS — N911 Secondary amenorrhea: Secondary | ICD-10-CM | POA: Diagnosis not present

## 2021-09-21 DIAGNOSIS — M545 Low back pain, unspecified: Secondary | ICD-10-CM | POA: Diagnosis not present

## 2021-09-21 DIAGNOSIS — M25562 Pain in left knee: Secondary | ICD-10-CM | POA: Diagnosis not present

## 2021-09-25 DIAGNOSIS — Z79899 Other long term (current) drug therapy: Secondary | ICD-10-CM | POA: Diagnosis not present

## 2021-10-19 DIAGNOSIS — G629 Polyneuropathy, unspecified: Secondary | ICD-10-CM | POA: Diagnosis not present

## 2021-10-19 DIAGNOSIS — N911 Secondary amenorrhea: Secondary | ICD-10-CM | POA: Diagnosis not present

## 2021-10-19 DIAGNOSIS — M542 Cervicalgia: Secondary | ICD-10-CM | POA: Diagnosis not present

## 2021-10-19 DIAGNOSIS — M545 Low back pain, unspecified: Secondary | ICD-10-CM | POA: Diagnosis not present

## 2021-10-19 DIAGNOSIS — M25561 Pain in right knee: Secondary | ICD-10-CM | POA: Diagnosis not present

## 2021-10-19 DIAGNOSIS — Z79899 Other long term (current) drug therapy: Secondary | ICD-10-CM | POA: Diagnosis not present

## 2021-10-22 DIAGNOSIS — Z79899 Other long term (current) drug therapy: Secondary | ICD-10-CM | POA: Diagnosis not present

## 2021-11-20 DIAGNOSIS — M25561 Pain in right knee: Secondary | ICD-10-CM | POA: Diagnosis not present

## 2021-11-20 DIAGNOSIS — M542 Cervicalgia: Secondary | ICD-10-CM | POA: Diagnosis not present

## 2021-11-20 DIAGNOSIS — N911 Secondary amenorrhea: Secondary | ICD-10-CM | POA: Diagnosis not present

## 2021-11-20 DIAGNOSIS — M545 Low back pain, unspecified: Secondary | ICD-10-CM | POA: Diagnosis not present

## 2021-11-20 DIAGNOSIS — Z131 Encounter for screening for diabetes mellitus: Secondary | ICD-10-CM | POA: Diagnosis not present

## 2021-11-20 DIAGNOSIS — G629 Polyneuropathy, unspecified: Secondary | ICD-10-CM | POA: Diagnosis not present

## 2021-11-20 DIAGNOSIS — Z79899 Other long term (current) drug therapy: Secondary | ICD-10-CM | POA: Diagnosis not present

## 2021-11-22 DIAGNOSIS — Z79899 Other long term (current) drug therapy: Secondary | ICD-10-CM | POA: Diagnosis not present

## 2021-12-04 ENCOUNTER — Encounter: Payer: Self-pay | Admitting: *Deleted

## 2021-12-10 ENCOUNTER — Ambulatory Visit (INDEPENDENT_AMBULATORY_CARE_PROVIDER_SITE_OTHER): Payer: Medicaid Other

## 2021-12-10 ENCOUNTER — Ambulatory Visit (HOSPITAL_COMMUNITY)
Admission: EM | Admit: 2021-12-10 | Discharge: 2021-12-10 | Disposition: A | Discharge status: Home / Self Care | Payer: Medicaid Other | Attending: Family Medicine | Admitting: Family Medicine

## 2021-12-10 ENCOUNTER — Encounter (HOSPITAL_COMMUNITY): Payer: Self-pay | Admitting: Emergency Medicine

## 2021-12-10 DIAGNOSIS — M542 Cervicalgia: Secondary | ICD-10-CM | POA: Diagnosis not present

## 2021-12-10 DIAGNOSIS — M25532 Pain in left wrist: Secondary | ICD-10-CM

## 2021-12-10 DIAGNOSIS — M545 Low back pain, unspecified: Secondary | ICD-10-CM

## 2021-12-10 DIAGNOSIS — M79645 Pain in left finger(s): Secondary | ICD-10-CM

## 2021-12-10 LAB — POC URINE PREG, ED: Preg Test, Ur: NEGATIVE

## 2021-12-10 MED ORDER — TIZANIDINE HCL 4 MG PO TABS: 4.0000 mg | ORAL_TABLET | Freq: Three times a day (TID) | ORAL | 0 refills | Status: DC | PRN

## 2021-12-10 MED ORDER — IBUPROFEN 800 MG PO TABS: 800.0000 mg | ORAL_TABLET | Freq: Three times a day (TID) | ORAL | 0 refills | Status: DC | PRN

## 2021-12-10 MED ORDER — KETOROLAC TROMETHAMINE 30 MG/ML IJ SOLN
INTRAMUSCULAR | Status: AC
  Filled 2021-12-10: qty 1

## 2021-12-10 MED ORDER — KETOROLAC TROMETHAMINE 30 MG/ML IJ SOLN
30.0000 mg | Freq: Once | INTRAMUSCULAR | Status: AC
  Administered 2021-12-10: 30 mg via INTRAMUSCULAR
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
## 2021-12-10 NOTE — Discharge Instructions (Addendum)
None the x-rays show any broken bones  You have been given a shot of Toradol 30 mg today.  Take ibuprofen 800 mg--1 tab every 8 hours as needed for pain.  Take tizanidine 4 mg--1 capsule every 8 hours as needed for muscle spasm  Heating pad can help also
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
## 2021-12-19 ENCOUNTER — Ambulatory Visit: Payer: Medicaid Other | Attending: Nurse Practitioner

## 2021-12-19 DIAGNOSIS — R262 Difficulty in walking, not elsewhere classified: Secondary | ICD-10-CM | POA: Diagnosis present

## 2021-12-19 DIAGNOSIS — M5459 Other low back pain: Secondary | ICD-10-CM | POA: Insufficient documentation

## 2021-12-19 DIAGNOSIS — M542 Cervicalgia: Secondary | ICD-10-CM | POA: Insufficient documentation

## 2021-12-19 DIAGNOSIS — M5431 Sciatica, right side: Secondary | ICD-10-CM | POA: Diagnosis present

## 2021-12-19 DIAGNOSIS — M6281 Muscle weakness (generalized): Secondary | ICD-10-CM | POA: Diagnosis present

## 2021-12-19 DIAGNOSIS — O3680X0 Pregnancy with inconclusive fetal viability, not applicable or unspecified: Secondary | ICD-10-CM

## 2021-12-19 DIAGNOSIS — O0990 Supervision of high risk pregnancy, unspecified, unspecified trimester: Secondary | ICD-10-CM

## 2021-12-20 ENCOUNTER — Other Ambulatory Visit (HOSPITAL_COMMUNITY): Payer: Self-pay

## 2021-12-20 ENCOUNTER — Other Ambulatory Visit: Payer: Self-pay

## 2021-12-20 DIAGNOSIS — M545 Low back pain, unspecified: Secondary | ICD-10-CM | POA: Diagnosis not present

## 2021-12-20 DIAGNOSIS — M25561 Pain in right knee: Secondary | ICD-10-CM | POA: Diagnosis not present

## 2021-12-20 DIAGNOSIS — N911 Secondary amenorrhea: Secondary | ICD-10-CM | POA: Diagnosis not present

## 2021-12-20 DIAGNOSIS — M542 Cervicalgia: Secondary | ICD-10-CM | POA: Diagnosis not present

## 2021-12-20 DIAGNOSIS — Z79899 Other long term (current) drug therapy: Secondary | ICD-10-CM | POA: Diagnosis not present

## 2021-12-20 DIAGNOSIS — G629 Polyneuropathy, unspecified: Secondary | ICD-10-CM | POA: Diagnosis not present

## 2021-12-20 MED ORDER — METFORMIN HCL 500 MG PO TABS
ORAL_TABLET | ORAL | 0 refills | Status: DC
  Filled 2021-12-20: qty 30, 30d supply, fill #0

## 2021-12-20 MED ORDER — CYCLOBENZAPRINE HCL 10 MG PO TABS
ORAL_TABLET | ORAL | 0 refills | Status: DC
  Filled 2021-12-20: qty 60, 20d supply, fill #0

## 2021-12-20 MED ORDER — GABAPENTIN 800 MG PO TABS
ORAL_TABLET | ORAL | 0 refills | Status: DC
  Filled 2021-12-20: qty 120, 30d supply, fill #0

## 2021-12-20 MED ORDER — OXYCODONE-ACETAMINOPHEN 10-325 MG PO TABS
ORAL_TABLET | ORAL | 0 refills | Status: DC
  Filled 2021-12-20: qty 180, 30d supply, fill #0

## 2021-12-24 DIAGNOSIS — Z79899 Other long term (current) drug therapy: Secondary | ICD-10-CM | POA: Diagnosis not present
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
## 2021-12-27 ENCOUNTER — Ambulatory Visit: Payer: Medicaid Other

## 2022-01-02 ENCOUNTER — Ambulatory Visit: Payer: Medicaid Other

## 2022-01-03 ENCOUNTER — Ambulatory Visit: Payer: Medicaid Other

## 2022-01-08 ENCOUNTER — Ambulatory Visit: Payer: Medicaid Other | Attending: Nurse Practitioner

## 2022-01-08 ENCOUNTER — Telehealth: Payer: Self-pay

## 2022-01-08 NOTE — Telephone Encounter (Signed)
Spoke to pt by phone re: today's no show appt. Pt has had frequent no show and cancellation appts. Pt stated this is not a good time for her attend PT. Pt will be Dced and she was advised to obtain a new referral if she would like to return to PT.

## 2022-01-10 ENCOUNTER — Ambulatory Visit: Payer: Medicaid Other

## 2022-01-15 DIAGNOSIS — I10 Essential (primary) hypertension: Secondary | ICD-10-CM | POA: Diagnosis not present

## 2022-01-15 DIAGNOSIS — E559 Vitamin D deficiency, unspecified: Secondary | ICD-10-CM | POA: Diagnosis not present

## 2022-01-15 DIAGNOSIS — Z6841 Body Mass Index (BMI) 40.0 and over, adult: Secondary | ICD-10-CM | POA: Diagnosis not present

## 2022-01-15 DIAGNOSIS — R7303 Prediabetes: Secondary | ICD-10-CM | POA: Diagnosis not present

## 2022-01-15 DIAGNOSIS — Z79899 Other long term (current) drug therapy: Secondary | ICD-10-CM | POA: Diagnosis not present

## 2022-01-16 ENCOUNTER — Other Ambulatory Visit (HOSPITAL_COMMUNITY): Payer: Self-pay

## 2022-01-16 DIAGNOSIS — Z79899 Other long term (current) drug therapy: Secondary | ICD-10-CM | POA: Diagnosis not present

## 2022-01-16 MED ORDER — PHENTERMINE HCL 15 MG PO CAPS
15.0000 mg | ORAL_CAPSULE | Freq: Every day | ORAL | 0 refills | Status: DC
  Filled 2022-01-16: qty 30, 30d supply, fill #0

## 2022-01-16 MED ORDER — GABAPENTIN 800 MG PO TABS
800.0000 mg | ORAL_TABLET | Freq: Four times a day (QID) | ORAL | 0 refills | Status: DC
  Filled 2022-01-16: qty 120, 30d supply, fill #0

## 2022-01-16 MED ORDER — CYCLOBENZAPRINE HCL 10 MG PO TABS
10.0000 mg | ORAL_TABLET | Freq: Three times a day (TID) | ORAL | 0 refills | Status: DC | PRN
  Filled 2022-01-16: qty 63, 21d supply, fill #0

## 2022-01-16 MED ORDER — METFORMIN HCL 500 MG PO TABS
500.0000 mg | ORAL_TABLET | Freq: Every evening | ORAL | 0 refills | Status: DC
  Filled 2022-01-16: qty 90, 90d supply, fill #0

## 2022-01-16 MED ORDER — OXYCODONE-ACETAMINOPHEN 10-325 MG PO TABS
1.0000 | ORAL_TABLET | ORAL | 0 refills | Status: DC
  Filled 2022-01-16 – 2022-01-17 (×2): qty 180, 30d supply, fill #0

## 2022-01-17 ENCOUNTER — Other Ambulatory Visit (HOSPITAL_COMMUNITY): Payer: Self-pay

## 2022-01-18 ENCOUNTER — Other Ambulatory Visit (HOSPITAL_COMMUNITY): Payer: Self-pay

## 2022-01-18 DIAGNOSIS — Z79899 Other long term (current) drug therapy: Secondary | ICD-10-CM | POA: Diagnosis not present

## 2022-02-06 ENCOUNTER — Encounter (INDEPENDENT_AMBULATORY_CARE_PROVIDER_SITE_OTHER): Payer: Self-pay

## 2022-02-18 NOTE — Progress Notes (Unsigned)
    SUBJECTIVE:   Chief compliant/HPI: annual examination  Shirley [name] is a 31 y.o. who presents today for an annual exam.   Reviewed and updated history ***.   Review of systems form notable for ***.    OBJECTIVE:   There were no vitals taken for this visit.  ***  ASSESSMENT/PLAN:   No problem-specific Assessment & Plan notes found for this encounter.    Annual Examination  See AVS for age appropriate recommendations  PHQ score ***, reviewed and discussed.  Blood pressure reviewed and at goal. ***   Advanced directive ***   Considered the following items based upon USPSTF recommendations: HIV testing: {not indicated/requested/declined:14582} *** neg in 2019 Hepatitis C: {not indicated/requested/declined:14582} Hepatitis B: {not indicated/requested/declined:14582} Syphilis if at high risk: Neg in 2019***{not indicated/requested/declined:14582} GC/CT{not indicated/requested/declined:14582} Lipid panel (nonfasting or fasting) discussed based upon AHA recommendations and not ordered.  Last done in 2021. Consider repeat every 4-6 years.  Reviewed risk factors for latent tuberculosis and {not indicated/requested/declined:14582} Immunizations ***   Follow up in 1 year or sooner if indicated.    Erick Alley, DO [place] [facility]

## 2022-02-19 ENCOUNTER — Other Ambulatory Visit (HOSPITAL_COMMUNITY): Payer: Self-pay

## 2022-02-19 ENCOUNTER — Ambulatory Visit: Payer: Medicaid Other | Admitting: Student

## 2022-02-19 DIAGNOSIS — N911 Secondary amenorrhea: Secondary | ICD-10-CM | POA: Diagnosis not present

## 2022-02-19 DIAGNOSIS — M542 Cervicalgia: Secondary | ICD-10-CM | POA: Diagnosis not present

## 2022-02-19 DIAGNOSIS — I10 Essential (primary) hypertension: Secondary | ICD-10-CM | POA: Diagnosis not present

## 2022-02-19 DIAGNOSIS — Z6841 Body Mass Index (BMI) 40.0 and over, adult: Secondary | ICD-10-CM | POA: Diagnosis not present

## 2022-02-19 DIAGNOSIS — R7303 Prediabetes: Secondary | ICD-10-CM | POA: Diagnosis not present

## 2022-02-19 DIAGNOSIS — G8929 Other chronic pain: Secondary | ICD-10-CM | POA: Diagnosis not present

## 2022-02-19 DIAGNOSIS — M545 Low back pain, unspecified: Secondary | ICD-10-CM | POA: Diagnosis not present

## 2022-02-19 DIAGNOSIS — M25561 Pain in right knee: Secondary | ICD-10-CM | POA: Diagnosis not present

## 2022-02-19 DIAGNOSIS — Z79899 Other long term (current) drug therapy: Secondary | ICD-10-CM | POA: Diagnosis not present

## 2022-02-19 DIAGNOSIS — G629 Polyneuropathy, unspecified: Secondary | ICD-10-CM | POA: Diagnosis not present

## 2022-02-19 DIAGNOSIS — M25562 Pain in left knee: Secondary | ICD-10-CM | POA: Diagnosis not present

## 2022-02-19 DIAGNOSIS — R5381 Other malaise: Secondary | ICD-10-CM | POA: Diagnosis not present

## 2022-02-19 MED ORDER — CYCLOBENZAPRINE HCL 10 MG PO TABS
ORAL_TABLET | ORAL | 0 refills | Status: DC
  Filled 2022-02-19: qty 63, 21d supply, fill #0

## 2022-02-19 MED ORDER — GABAPENTIN 800 MG PO TABS
ORAL_TABLET | ORAL | 0 refills | Status: DC
  Filled 2022-02-19: qty 120, 30d supply, fill #0

## 2022-02-19 MED ORDER — OXYCODONE-ACETAMINOPHEN 10-325 MG PO TABS
ORAL_TABLET | ORAL | 0 refills | Status: DC
  Filled 2022-02-19: qty 180, 30d supply, fill #0

## 2022-02-19 MED ORDER — PHENTERMINE HCL 15 MG PO CAPS
15.0000 mg | ORAL_CAPSULE | Freq: Every day | ORAL | 1 refills | Status: DC
  Filled 2022-02-19 – 2022-03-20 (×3): qty 30, 30d supply, fill #0

## 2022-02-21 DIAGNOSIS — Z79899 Other long term (current) drug therapy: Secondary | ICD-10-CM | POA: Diagnosis not present

## 2022-02-28 ENCOUNTER — Other Ambulatory Visit (HOSPITAL_COMMUNITY): Payer: Self-pay

## 2022-03-09 ENCOUNTER — Ambulatory Visit (HOSPITAL_COMMUNITY)
Admission: EM | Admit: 2022-03-09 | Discharge: 2022-03-09 | Disposition: A | Discharge status: Home / Self Care | Payer: Medicaid Other | Attending: Physician Assistant | Admitting: Physician Assistant

## 2022-03-09 ENCOUNTER — Encounter (HOSPITAL_COMMUNITY): Payer: Self-pay

## 2022-03-09 DIAGNOSIS — G44209 Tension-type headache, unspecified, not intractable: Secondary | ICD-10-CM

## 2022-03-09 DIAGNOSIS — Z3202 Encounter for pregnancy test, result negative: Secondary | ICD-10-CM

## 2022-03-09 DIAGNOSIS — N926 Irregular menstruation, unspecified: Secondary | ICD-10-CM | POA: Diagnosis not present

## 2022-03-09 LAB — POCT URINALYSIS DIPSTICK, ED / UC
Bilirubin Urine: NEGATIVE
Glucose, UA: NEGATIVE mg/dL
Leukocytes,Ua: NEGATIVE
Nitrite: NEGATIVE
Protein, ur: NEGATIVE mg/dL
Specific Gravity, Urine: 1.025 (ref 1.005–1.030)
Urobilinogen, UA: 0.2 mg/dL (ref 0.0–1.0)
pH: 5.5 (ref 5.0–8.0)

## 2022-03-09 LAB — HCG, QUANTITATIVE, PREGNANCY: hCG, Beta Chain, Quant, S: 1 m[IU]/mL (ref <5)

## 2022-03-09 LAB — POC URINE PREG, ED: Preg Test, Ur: NEGATIVE

## 2022-03-09 MED ORDER — NAPROXEN 500 MG PO TABS: 500.0000 mg | ORAL_TABLET | Freq: Two times a day (BID) | ORAL | 0 refills | Status: DC

## 2022-03-09 MED ORDER — BACLOFEN 10 MG PO TABS: 10.0000 mg | ORAL_TABLET | Freq: Two times a day (BID) | ORAL | 0 refills | Status: DC

## 2022-03-09 NOTE — ED Provider Notes (Signed)
[facility]    CSN: [id number] Arrival date & time: 03/09/22  1355      History   Chief Complaint Chief Complaint  Patient presents with   Headache   Abdominal Pain    HPI Shirley [name] is a 31 y.o. female.   Patient presents today with a week long history of headache.  She does have a history of primary headache disorder including migraines and tension headaches but states normally this resolves with medication and hers have persisted.  She also reports occasional abdominal cramping particular in the lower abdomen.  Denies any urinary symptoms including frequency, urgency, hematuria.  Denies any pelvic pain, vaginal discharge.  Denies any fever, nausea, vomiting.  She has been taking Tylenol ibuprofen without improvement of symptoms.  Denies any recent medication changes or antibiotic use.  Denies any recent illness or additional symptoms including cough, congestion, fever.  She does report that her last menstrual cycle was 02/17/2022 but was much lighter than normal.  She does not member when her last mental cycle was prior to this.  She did take a at-home test that she thought was positive but then realized she took it incorrectly and when she took another it was negative.  She reports that pain is rated 7 on a 0-10 pain scale, described as aching, no aggravating relieving factors identified.  She denies any dysarthria, numbness, paresthesias, focal weakness, nausea, vomiting, visual disturbance.    Past Medical History:  Diagnosis Date   Anxiety    Arthritis    Back pain    Chest pain    Constipation    Depression    Edema, lower extremity    GERD (gastroesophageal reflux disease)    Hip dysplasia    Hypertension    Joint pain    Obesity    Osteoarthritis    Rheumatoid arthritis (HCC)    Sciatica    Shortness of breath    Snapping hip syndrome     Patient Active Problem List   Diagnosis Date Noted   Supervision of high risk pregnancy, antepartum  06/19/2021   Pregnancy of unknown anatomic location 05/16/2021   Assistance needed with transportation 11/22/2019   Low HDL (under 40) 10/05/2019   Vitamin D deficiency 10/05/2019   Elevated alkaline phosphatase level 10/05/2019   Excessive daytime sleepiness 10/05/2019   Anxiety and depression 10/05/2019   Neck pain 07/16/2019   Trapezius muscle spasm 02/25/2019   Acute pain of both shoulders 02/25/2019   Finger pain, right 02/13/2019   Disorder of hip joint 02/16/2018   Chronic low back pain 01/28/2018   Osteoarthritis of both knees 09/29/2015   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   Low back pain with right-sided sciatica 09/15/2015   Essential hypertension    Class 3 severe obesity with serious comorbidity and body mass index (BMI) of 50.0 to 59.9 in adult [facility]) 12/02/2006   Primary hypertension 08/28/2006   Blackhead 08/28/2006    Past Surgical History:  Procedure Laterality Date   TOOTH EXTRACTION N/A 10/22/2013   Procedure: DENTAL EXTRACTIONS TEETH #1, 16, 17, 32;  Surgeon: Georgia Lopes, DDS;  Location: [facility] OR;  Service: Oral Surgery;  Laterality: N/A;    OB History     Gravida  5   Para  3   Term  3   Preterm      AB  1   Living  3      SAB  IAB  1   Ectopic      Multiple  0   Live Births  3            Home Medications    Prior to Admission medications   Medication Sig Start Date End Date Taking? Authorizing Provider  baclofen (LIORESAL) 10 MG tablet Take 1 tablet (10 mg total) by mouth 2 (two) times daily. 03/09/22  Yes Madinah Quarry K, PA-C  naproxen (NAPROSYN) 500 MG tablet Take 1 tablet (500 mg total) by mouth 2 (two) times daily. 03/09/22  Yes Acey Woodfield, Noberto Retort, PA-C  Blood Pressure Monitoring DEVI 1 each by Does not apply route once a week. 06/19/21   Warner Mccreedy, MD  gabapentin (NEURONTIN) 800 MG tablet Take 1 tablet 4 times daily for neuropathy. 02/19/22     metFORMIN (GLUCOPHAGE) 500 MG tablet Take 1 tablet (500  mg total) by mouth every evening with the largest meal 01/16/22     Misc. Devices (GOJJI WEIGHT SCALE) MISC 1 each by Does not apply route daily. 06/19/21   Warner Mccreedy, MD  oxyCODONE-acetaminophen (PERCOCET) 10-325 MG tablet Take 1 tablet by mouth 6 times daily as needed for pain. 02/19/22     phentermine 15 MG capsule Take 1 capsule by mouth daily. 02/19/22       Family History Family History  Problem Relation Age of Onset   Hypertension Mother    Depression Mother    Anxiety disorder Mother    Obesity Mother    Diabetes Maternal Grandmother    Diabetes Paternal Grandmother    Stroke Paternal Grandmother    Hypertension Father     Social History Social History   Tobacco Use   Smoking status: Former    Packs/day: 0.50    Types: Cigarettes    Quit date: 01/29/2017    Years since quitting: 5.1   Smokeless tobacco: Never  Substance Use Topics   Alcohol use: No    Alcohol/week: 0.0 standard drinks of alcohol    Comment: rare   Drug use: No    Types: Marijuana    Comment: daily usage     Allergies   Patient has no known allergies.   Review of Systems Review of Systems  Constitutional:  Positive for activity change. Negative for appetite change, fatigue and fever.  HENT:  Negative for congestion, sinus pressure, sneezing and sore throat.   Respiratory:  Negative for cough and shortness of breath.   Cardiovascular:  Negative for chest pain.  Gastrointestinal:  Positive for abdominal pain. Negative for diarrhea, nausea and vomiting.  Genitourinary:  Negative for dysuria, frequency, pelvic pain, urgency, vaginal bleeding, vaginal discharge and vaginal pain.  Neurological:  Positive for headaches. Negative for dizziness, syncope, facial asymmetry, speech difficulty, weakness, light-headedness and numbness.     Physical Exam Triage Vital Signs ED Triage Vitals  Enc Vitals Group     BP 03/09/22 1510 (!) 142/87     Pulse Rate 03/09/22 1510 77     Resp 03/09/22 1510 12      Temp 03/09/22 1510 98.5 F (36.9 C)     Temp Source 03/09/22 1510 Oral     SpO2 03/09/22 1510 99 %     Weight 03/09/22 1507 (!) 304 lb (137.9 kg)     Height 03/09/22 1507 5\' 6"  (1.676 m)     Head Circumference --      Peak Flow --      Pain Score 03/09/22 1506 7  Pain Loc --      Pain Edu? --      Excl. in [place]? --    No data found.  Updated Vital Signs BP (!) 142/87 (BP Location: Left Arm)   Pulse 77   Temp 98.5 F (36.9 C) (Oral)   Resp 12   Ht 5\' 6"  (1.676 m)   Wt (!) 304 lb (137.9 kg)   LMP  (LMP Unknown)   SpO2 99%   BMI 49.07 kg/m   Visual Acuity Right Eye Distance:   Left Eye Distance:   Bilateral Distance:    Right Eye Near:   Left Eye Near:    Bilateral Near:     Physical Exam Vitals reviewed.  Constitutional:      General: She is awake. She is not in acute distress.    Appearance: Normal appearance. She is well-developed. She is not ill-appearing.     Comments: Very pleasant female appears stated age in no acute distress sitting comfortably in exam room  HENT:     Head: Normocephalic and atraumatic. No raccoon eyes, Battle's sign or contusion.     Right Ear: Tympanic membrane, ear canal and external ear normal. No hemotympanum.     Left Ear: Tympanic membrane, ear canal and external ear normal. No hemotympanum.     Nose: Nose normal.     Mouth/Throat:     Tongue: Tongue does not deviate from midline.     Pharynx: Uvula midline. No oropharyngeal exudate or posterior oropharyngeal erythema.  Eyes:     Extraocular Movements: Extraocular movements intact.     Pupils: Pupils are equal, round, and reactive to light.  Cardiovascular:     Rate and Rhythm: Normal rate and regular rhythm.     Heart sounds: Normal heart sounds, S1 normal and S2 normal. No murmur heard. Pulmonary:     Effort: Pulmonary effort is normal.     Breath sounds: Normal breath sounds. No wheezing, rhonchi or rales.     Comments: Clear to auscultation bilaterally Abdominal:      General: Bowel sounds are normal.     Palpations: Abdomen is soft.     Tenderness: There is no abdominal tenderness. There is no right CVA tenderness, left CVA tenderness, guarding or rebound.     Comments: Benign abdominal exam.  No tenderness palpation on exam.  Musculoskeletal:     Cervical back: Normal range of motion and neck supple. No spinous process tenderness or muscular tenderness.     Comments: Strength 5/5 bilateral upper and lower extremities  Neurological:     General: No focal deficit present.     Mental Status: She is alert and oriented to person, place, and time.     Cranial Nerves: Cranial nerves 2-12 are intact.     Motor: Motor function is intact.     Coordination: Coordination is intact.     Gait: Gait is intact.     Comments: No focal neurological defect on exam.  Psychiatric:        Behavior: Behavior is cooperative.      UC Treatments / Results  Labs (all labs ordered are listed, but only abnormal results are displayed) Labs Reviewed  POCT URINALYSIS DIPSTICK, ED / UC - Abnormal; Notable for the following components:      Result Value   Ketones, ur TRACE (*)    Hgb urine dipstick TRACE (*)    All other components within normal limits  HCG, QUANTITATIVE, PREGNANCY  POC URINE PREG, ED  EKG   Radiology No results found.  Procedures Procedures (including critical care time)  Medications Ordered in UC Medications - No data to display  Initial Impression / Assessment and Plan / UC Course  I have reviewed the triage vital signs and the nursing notes.  Pertinent labs & imaging results that were available during my care of the patient were reviewed by me and considered in my medical decision making (see chart for details).     Patient is well-appearing, afebrile, nontoxic, nontachycardic.  No indication for emergent evaluation or imaging based on presentation today.  Symptoms consistent with tension headache.  Urine pregnancy was negative in  clinic today.  UA showed ketones and hemoglobin was otherwise normal.  No evidence of infection.  Patient did request that we obtain blood test to ensure she is not pregnant since she did have that potentially positive test at home.  hCG testing was obtained and we discussed that this is likely to be negative given she has had at home negative test as well as a urine negative in clinic today.  We will plan treatment as though she is negative for pregnancy but discussed that she should monitor her MyChart and if she is positive we will contact her and she should not start the medications until she confirms that her pregnancy test was negative through [place].  She was started on Naprosyn for pain with instruction not to take NSAIDs with this medication.  Recommended she rest and drink plenty of fluid.  She is eating small frequent meals.  Recommended magnesium supplement to prevent headaches.  She was given baclofen for pain relief with instruction not to drive drink alcohol with taking this medication as drowsiness is a common side effect.  Discussed that if she develops any worsening symptoms including severe headache, worsening of her life, dysarthria, nausea/vomiting interfere with oral intake, severe abdominal pain, fever she needs to go to the emergency room immediately.  Strict return precautions given.  Work excuse note provided.  Final Clinical Impressions(s) / UC Diagnoses   Final diagnoses:  Tension headache  Irregular menses     Discharge Instructions      Your urine pregnancy was negative in clinic today.  Monitor your MyChart for your blood work results.  If this is positive I will call you.  If it is negative you can start our treatment plan.  Please do not take any other medications prescribed until you have a negative result in MyChart.  I believe your headache is a tension headache.  Please make sure that you are drinking plenty of fluid and eating small frequent meals.  Use baclofen  for pain relief.  This make you sleepy do not drive or drink alcohol while taking it.  Use Naprosyn for additional pain relief.  You should not take NSAIDs with this medication due to risk of GI bleeding including additional Naprosyn/naproxen, ibuprofen, aspirin.  Can use Tylenol for breakthrough pain.  I also recommend a magnesium supplement to help prevent headaches.  Follow-up closely with your primary care.  If anything worsens you need to go to the emergency room immediately.     ED Prescriptions     Medication Sig Dispense Auth. Provider   baclofen (LIORESAL) 10 MG tablet Take 1 tablet (10 mg total) by mouth 2 (two) times daily. 30 each Diogenes Whirley K, PA-C   naproxen (NAPROSYN) 500 MG tablet Take 1 tablet (500 mg total) by mouth 2 (two) times daily. 30 tablet Yehoshua Vitelli, Derry Skill,  PA-C      I have reviewed the PDMP during this encounter.   Terrilee Croak, PA-C 03/09/22 1613

## 2022-03-09 NOTE — ED Triage Notes (Signed)
Pt complains of abdominal pain , headache . Pt denies fever , vomiting and diarrhea x1 1/2 a wks

## 2022-03-09 NOTE — Discharge Instructions (Signed)
Your urine pregnancy was negative in clinic today.  Monitor your MyChart for your blood work results.  If this is positive I will call you.  If it is negative you can start our treatment plan.  Please do not take any other medications prescribed until you have a negative result in MyChart.  I believe your headache is a tension headache.  Please make sure that you are drinking plenty of fluid and eating small frequent meals.  Use baclofen for pain relief.  This make you sleepy do not drive or drink alcohol while taking it.  Use Naprosyn for additional pain relief.  You should not take NSAIDs with this medication due to risk of GI bleeding including additional Naprosyn/naproxen, ibuprofen, aspirin.  Can use Tylenol for breakthrough pain.  I also recommend a magnesium supplement to help prevent headaches.  Follow-up closely with your primary care.  If anything worsens you need to go to the emergency room immediately.

## 2022-03-14 ENCOUNTER — Other Ambulatory Visit (HOSPITAL_COMMUNITY): Payer: Self-pay

## 2022-03-15 ENCOUNTER — Ambulatory Visit: Payer: Medicaid Other | Admitting: Student

## 2022-03-20 ENCOUNTER — Other Ambulatory Visit (HOSPITAL_COMMUNITY): Payer: Self-pay

## 2022-03-20 DIAGNOSIS — Z79899 Other long term (current) drug therapy: Secondary | ICD-10-CM | POA: Diagnosis not present

## 2022-03-20 MED ORDER — GABAPENTIN 800 MG PO TABS
800.0000 mg | ORAL_TABLET | Freq: Four times a day (QID) | ORAL | 0 refills | Status: DC
  Filled 2022-03-20: qty 120, 30d supply, fill #0

## 2022-03-20 MED ORDER — CYCLOBENZAPRINE HCL 10 MG PO TABS
10.0000 mg | ORAL_TABLET | Freq: Three times a day (TID) | ORAL | 0 refills | Status: DC
  Filled 2022-03-20: qty 90, 30d supply, fill #0

## 2022-03-20 MED ORDER — NALOXONE HCL 4 MG/0.1ML NA LIQD
NASAL | 12 refills | Status: DC
  Filled 2022-03-20: qty 2, 30d supply, fill #0

## 2022-03-20 MED ORDER — OXYCODONE-ACETAMINOPHEN 10-325 MG PO TABS
ORAL_TABLET | ORAL | 0 refills | Status: DC
  Filled 2022-03-20: qty 180, 30d supply, fill #0

## 2022-04-17 ENCOUNTER — Other Ambulatory Visit (HOSPITAL_COMMUNITY): Payer: Self-pay

## 2022-04-17 DIAGNOSIS — Z79899 Other long term (current) drug therapy: Secondary | ICD-10-CM | POA: Diagnosis not present

## 2022-04-17 DIAGNOSIS — R7303 Prediabetes: Secondary | ICD-10-CM | POA: Diagnosis not present

## 2022-04-17 MED ORDER — PHENTERMINE HCL 15 MG PO CAPS
15.0000 mg | ORAL_CAPSULE | Freq: Every day | ORAL | 0 refills | Status: DC
  Filled 2022-04-17: qty 30, 30d supply, fill #0

## 2022-04-17 MED ORDER — GABAPENTIN 800 MG PO TABS
800.0000 mg | ORAL_TABLET | Freq: Four times a day (QID) | ORAL | 0 refills | Status: DC
  Filled 2022-04-17: qty 120, 30d supply, fill #0

## 2022-04-17 MED ORDER — OXYCODONE-ACETAMINOPHEN 10-325 MG PO TABS
1.0000 | ORAL_TABLET | Freq: Every day | ORAL | 0 refills | Status: DC
  Filled 2022-04-17: qty 180, 30d supply, fill #0

## 2022-04-17 MED ORDER — METFORMIN HCL 500 MG PO TABS
500.0000 mg | ORAL_TABLET | Freq: Every evening | ORAL | 0 refills | Status: DC
  Filled 2022-04-17: qty 90, 90d supply, fill #0

## 2022-04-17 MED ORDER — CYCLOBENZAPRINE HCL 10 MG PO TABS
10.0000 mg | ORAL_TABLET | Freq: Three times a day (TID) | ORAL | 0 refills | Status: DC | PRN
  Filled 2022-04-17: qty 90, 30d supply, fill #0

## 2022-04-19 DIAGNOSIS — Z79899 Other long term (current) drug therapy: Secondary | ICD-10-CM | POA: Diagnosis not present

## 2022-05-03 ENCOUNTER — Other Ambulatory Visit (HOSPITAL_COMMUNITY): Payer: Self-pay

## 2022-05-17 ENCOUNTER — Other Ambulatory Visit (HOSPITAL_COMMUNITY): Payer: Self-pay

## 2022-05-17 DIAGNOSIS — Z79899 Other long term (current) drug therapy: Secondary | ICD-10-CM | POA: Diagnosis not present

## 2022-05-17 MED ORDER — CYCLOBENZAPRINE HCL 10 MG PO TABS
10.0000 mg | ORAL_TABLET | Freq: Three times a day (TID) | ORAL | 0 refills | Status: DC | PRN
  Filled 2022-05-17: qty 90, 30d supply, fill #0

## 2022-05-17 MED ORDER — OXYCODONE-ACETAMINOPHEN 10-325 MG PO TABS
1.0000 | ORAL_TABLET | Freq: Every day | ORAL | 0 refills | Status: DC
  Filled 2022-05-17: qty 180, 30d supply, fill #0

## 2022-05-17 MED ORDER — METFORMIN HCL 500 MG PO TABS
500.0000 mg | ORAL_TABLET | Freq: Every evening | ORAL | 0 refills | Status: DC
  Filled 2022-05-17: qty 90, 90d supply, fill #0

## 2022-05-17 MED ORDER — PHENTERMINE HCL 30 MG PO CAPS
30.0000 mg | ORAL_CAPSULE | Freq: Every day | ORAL | 0 refills | Status: DC
  Filled 2022-05-17: qty 30, 30d supply, fill #0

## 2022-05-17 MED ORDER — GABAPENTIN 800 MG PO TABS
800.0000 mg | ORAL_TABLET | Freq: Four times a day (QID) | ORAL | 0 refills | Status: DC
  Filled 2022-05-17: qty 120, 30d supply, fill #0

## 2022-05-21 DIAGNOSIS — Z79899 Other long term (current) drug therapy: Secondary | ICD-10-CM | POA: Diagnosis not present

## 2022-06-14 ENCOUNTER — Other Ambulatory Visit (HOSPITAL_COMMUNITY): Payer: Self-pay

## 2022-06-14 DIAGNOSIS — Z79899 Other long term (current) drug therapy: Secondary | ICD-10-CM | POA: Diagnosis not present

## 2022-06-14 MED ORDER — CYCLOBENZAPRINE HCL 10 MG PO TABS
10.0000 mg | ORAL_TABLET | Freq: Three times a day (TID) | ORAL | 0 refills | Status: DC | PRN
  Filled 2022-06-14: qty 63, 21d supply, fill #0

## 2022-06-14 MED ORDER — OXYCODONE-ACETAMINOPHEN 10-325 MG PO TABS
1.0000 | ORAL_TABLET | Freq: Every day | ORAL | 0 refills | Status: DC
  Filled 2022-06-14: qty 180, 30d supply, fill #0

## 2022-06-14 MED ORDER — PHENTERMINE HCL 30 MG PO CAPS
30.0000 mg | ORAL_CAPSULE | Freq: Every day | ORAL | 0 refills | Status: DC
  Filled 2022-06-14: qty 30, 30d supply, fill #0

## 2022-06-18 DIAGNOSIS — Z79899 Other long term (current) drug therapy: Secondary | ICD-10-CM | POA: Diagnosis not present

## 2022-07-02 ENCOUNTER — Ambulatory Visit (HOSPITAL_COMMUNITY)
Admission: EM | Admit: 2022-07-02 | Discharge: 2022-07-02 | Disposition: A | Discharge status: Home / Self Care | Payer: Medicaid Other | Attending: Nurse Practitioner | Admitting: Nurse Practitioner

## 2022-07-02 ENCOUNTER — Encounter (HOSPITAL_COMMUNITY): Payer: Self-pay

## 2022-07-02 DIAGNOSIS — K047 Periapical abscess without sinus: Secondary | ICD-10-CM | POA: Diagnosis not present

## 2022-07-02 DIAGNOSIS — K0889 Other specified disorders of teeth and supporting structures: Secondary | ICD-10-CM

## 2022-07-02 MED ORDER — AMOXICILLIN-POT CLAVULANATE 875-125 MG PO TABS: 1.0000 | ORAL_TABLET | Freq: Two times a day (BID) | ORAL | 0 refills | Status: AC

## 2022-07-02 MED ORDER — KETOROLAC TROMETHAMINE 30 MG/ML IJ SOLN
30.0000 mg | Freq: Once | INTRAMUSCULAR | Status: AC
  Administered 2022-07-02: 30 mg via INTRAMUSCULAR

## 2022-07-02 MED ORDER — KETOROLAC TROMETHAMINE 30 MG/ML IJ SOLN
INTRAMUSCULAR | Status: AC
  Filled 2022-07-02: qty 1

## 2022-07-02 NOTE — Discharge Instructions (Addendum)
You have an infection in your tooth.  Please take the Augmentin as prescribed to treat bacteria in your mouth.  We have given you a shot of Toradol today to help with pain.  This is a strong anti-inflammatory.  Do not take any other anti-inflammatories today including no ibuprofen, Advil, Aleve, or BC Goody.  You can continue your regular pain medication.  Please apply ice to the outside of your mouth to help with swelling and inflammation.  If the swelling and inflammation does not improve in the next 48 hours with the antibiotic or if you develop high fevers, nausea/vomiting, and are unable to keep fluids down, please go to the ER.

## 2022-07-02 NOTE — ED Provider Notes (Signed)
[place]    CSN: [id number] Arrival date & time: 07/02/22  8469      History   Chief Complaint Chief Complaint  Patient presents with   Dental Pain    HPI Shirley [name] is a 32 y.o. female.   Patient presents today for 1 day history of dental pain and right lower jaw.  Reports this happened previously and had to have her tooth removed in a similar area.  The pain in her gum is now around the next tooth.  She does not see dentist regularly.  No recent injury to her teeth that she knows of.  No fevers, body aches or chills.  Reports her face feels swollen.  No redness, numbness or tingling, sinus pressure, nausea or vomiting.  Has taken over-the-counter pain reliever as well as oxycodone which does not help.  She takes oxycodone on a regular basis for back pain.  Reports she does have a dentist, appointment is upcoming in the next month.  She does not follow with a dentist regularly.    Past Medical History:  Diagnosis Date   Anxiety    Arthritis    Back pain    Chest pain    Constipation    Depression    Edema, lower extremity    GERD (gastroesophageal reflux disease)    Hip dysplasia    Hypertension    Joint pain    Obesity    Osteoarthritis    Rheumatoid arthritis ([place])    Sciatica    Shortness of breath    Snapping hip syndrome     Patient Active Problem List   Diagnosis Date Noted   Supervision of high risk pregnancy, antepartum 06/19/2021   Pregnancy of unknown anatomic location 05/16/2021   Assistance needed with transportation 11/22/2019   Low HDL (under 40) 10/05/2019   Vitamin D deficiency 10/05/2019   Elevated alkaline phosphatase level 10/05/2019   Excessive daytime sleepiness 10/05/2019   Anxiety and depression 10/05/2019   Neck pain 07/16/2019   Trapezius muscle spasm 02/25/2019   Acute pain of both shoulders 02/25/2019   Finger pain, right 02/13/2019   Disorder of hip joint 02/16/2018   Chronic low back pain 01/28/2018    Osteoarthritis of both knees 09/29/2015   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   Low back pain with right-sided sciatica 09/15/2015   Essential hypertension    Class 3 severe obesity with serious comorbidity and body mass index (BMI) of 50.0 to 59.9 in adult [facility]) 12/02/2006   Primary hypertension 08/28/2006   [place] 08/28/2006    Past Surgical History:  Procedure Laterality Date   TOOTH EXTRACTION N/A 10/22/2013   Procedure: DENTAL EXTRACTIONS TEETH #1, 16, 17, 32;  Surgeon: Gae Bon, DDS;  Location: [facility];  Service: Oral Surgery;  Laterality: N/A;    OB History     Gravida  5   Para  3   Term  3   Preterm      AB  1   Living  3      SAB      IAB  1   Ectopic      Multiple  0   Live Births  3            Home Medications    Prior to Admission medications   Medication Sig Start Date End Date Taking? Authorizing Provider  amoxicillin-clavulanate (AUGMENTIN) 875-125 MG tablet Take 1 tablet by mouth 2 (two) times  daily for 10 days. 07/02/22 07/12/22 Yes Eulogio Bear, NP  baclofen (LIORESAL) 10 MG tablet Take 1 tablet (10 mg total) by mouth 2 (two) times daily. 03/09/22   Raspet, Derry Skill, PA-C  Blood Pressure Monitoring DEVI 1 each by Does not apply route once a week. 06/19/21   Renard Matter, MD  cyclobenzaprine (FLEXERIL) 10 MG tablet Take 1 tablet (10 mg total) by mouth 3 (three) times daily as needed for muscle spasms 03/20/22     cyclobenzaprine (FLEXERIL) 10 MG tablet Take 1 tablet (10 mg total) by mouth 3 (three) times daily as needed for muscle spasms. 04/17/22     cyclobenzaprine (FLEXERIL) 10 MG tablet Take 1 tablet (10 mg total) by mouth 3 (three) times daily as needed for muscle spasms. 05/17/22     cyclobenzaprine (FLEXERIL) 10 MG tablet Take 1 tablet (10 mg total) by mouth 3 (three) times daily as needed for muscle spasms. 06/14/22     gabapentin (NEURONTIN) 800 MG tablet Take 1 tablet (800 mg total) by mouth 4 (four)  times daily for neuropathy 03/20/22     gabapentin (NEURONTIN) 800 MG tablet Take 1 tablet (800 mg total) by mouth 4 (four) times daily for neuropathy. 04/17/22     gabapentin (NEURONTIN) 800 MG tablet Take 1 tablet (800 mg total) by mouth 4 (four) times daily for neuropathy. 05/17/22     metFORMIN (GLUCOPHAGE) 500 MG tablet Take 1 tablet (500 mg total) by mouth every evening with largest meal. 04/17/22     metFORMIN (GLUCOPHAGE) 500 MG tablet Take 1 tablet (500 mg total) by mouth every evening with largest meal. 05/17/22     Misc. Devices (GOJJI WEIGHT SCALE) MISC 1 each by Does not apply route daily. 06/19/21   Renard Matter, MD  naloxone [facility]) nasal spray 4 mg/0.1 mL 1 (one) Spray as needed, if found unresponsive then spray into nose and call 911 immediately 03/20/22     naproxen (NAPROSYN) 500 MG tablet Take 1 tablet (500 mg total) by mouth 2 (two) times daily. 03/09/22   Raspet, Derry Skill, PA-C  oxyCODONE-acetaminophen (PERCOCET) 10-325 MG tablet Take 1 tablet by mouth 6 (six) times daily as needed for pain 06/14/22     phentermine 15 MG capsule Take 1 capsule (15 mg total) by mouth daily. 02/19/22     phentermine 15 MG capsule Take 1 capsule (15 mg total) by mouth daily. 04/17/22     phentermine 30 MG capsule Take 1 capsule (30 mg total) by mouth daily. 06/14/22       Family History Family History  Problem Relation Age of Onset   Hypertension Mother    Depression Mother    Anxiety disorder Mother    Obesity Mother    Diabetes Maternal Grandmother    Diabetes Paternal Grandmother    Stroke Paternal Grandmother    Hypertension Father     Social History Social History   Tobacco Use   Smoking status: Former    Packs/day: 0.50    Types: Cigarettes    Quit date: 01/29/2017    Years since quitting: 5.4   Smokeless tobacco: Never  Substance Use Topics   Alcohol use: No    Alcohol/week: 0.0 standard drinks of alcohol    Comment: rare   Drug use: No    Types: Marijuana    Comment: daily  usage     Allergies   Patient has no known allergies.   Review of Systems Review of Systems Per HPI  Physical  Exam Triage Vital Signs ED Triage Vitals [07/02/22 0930]  Enc Vitals Group     BP (!) 174/137     Pulse Rate 77     Resp 18     Temp (!) 97.2 F (36.2 C)     Temp Source Oral     SpO2 98 %     Weight      Height      Head Circumference      Peak Flow      Pain Score 10     Pain Loc      Pain Edu?      Excl. in [place]?    No data found.  Updated Vital Signs BP (!) 174/137 (BP Location: Right Arm)   Pulse 77   Temp (!) 97.2 F (36.2 C) (Oral)   Resp 18   LMP 06/06/2022   SpO2 98%   Visual Acuity Right Eye Distance:   Left Eye Distance:   Bilateral Distance:    Right Eye Near:   Left Eye Near:    Bilateral Near:     Physical Exam Vitals and nursing note reviewed.  Constitutional:      General: She is not in acute distress.    Appearance: Normal appearance. She is not toxic-appearing.     Comments: Appears to be in pain  HENT:     Head: Normocephalic and atraumatic.      Comments: Mild swelling to right side of face    Nose: Nose normal. No congestion or rhinorrhea.     Mouth/Throat:     Mouth: Mucous membranes are moist.     Dentition: Abnormal dentition. Dental tenderness and dental abscesses present. No gingival swelling.     Tongue: No lesions.     Pharynx: Oropharynx is clear. No posterior oropharyngeal erythema.     Tonsils: No tonsillar exudate. 0 on the right.   Eyes:     General: No scleral icterus.    Extraocular Movements: Extraocular movements intact.  Pulmonary:     Effort: Pulmonary effort is normal. No respiratory distress.  Musculoskeletal:     Cervical back: Normal range of motion.  Lymphadenopathy:     Cervical: No cervical adenopathy.  Skin:    General: Skin is warm and dry.     Coloration: Skin is not jaundiced or pale.     Findings: No erythema.  Neurological:     Mental Status: She is alert and oriented to  person, place, and time.  Psychiatric:        Behavior: Behavior is cooperative.      UC Treatments / Results  Labs (all labs ordered are listed, but only abnormal results are displayed) Labs Reviewed - No data to display  EKG   Radiology No results found.  Procedures Procedures (including critical care time)  Medications Ordered in UC Medications  ketorolac (TORADOL) 30 MG/ML injection 30 mg (30 mg Intramuscular Given 07/02/22 0958)    Initial Impression / Assessment and Plan / UC Course  I have reviewed the triage vital signs and the nursing notes.  Pertinent labs & imaging results that were available during my care of the patient were reviewed by me and considered in my medical decision making (see chart for details).   Patient is well-appearing, normotensive, afebrile, not tachycardic, not tachypneic, oxygenating well on room air.    Dentalgia Dental abscess Concern for dental abscess Treat with Augmentin twice daily for 10 days Toradol 30 mg IM given today in urgent  care for pain control, recommended no other NSAIDs today, can use cool compresses/ice packs Strict ER precautions discussed with patient  The patient was given the opportunity to ask questions.  All questions answered to their satisfaction.  The patient is in agreement to this plan.    Final Clinical Impressions(s) / UC Diagnoses   Final diagnoses:  [place] abscess     Discharge Instructions      You have an infection in your tooth.  Please take the Augmentin as prescribed to treat bacteria in your mouth.  We have given you a shot of Toradol today to help with pain.  This is a strong anti-inflammatory.  Do not take any other anti-inflammatories today including no ibuprofen, Advil, Aleve, or BC Goody.  You can continue your regular pain medication.  Please apply ice to the outside of your mouth to help with swelling and inflammation.  If the swelling and inflammation does not improve in the  next 48 hours with the antibiotic or if you develop high fevers, nausea/vomiting, and are unable to keep fluids down, please go to the ER.    ED Prescriptions     Medication Sig Dispense Auth. Provider   amoxicillin-clavulanate (AUGMENTIN) 875-125 MG tablet Take 1 tablet by mouth 2 (two) times daily for 10 days. 20 tablet Eulogio Bear, NP      PDMP not reviewed this encounter.   Eulogio Bear, NP 07/02/22 1137

## 2022-07-02 NOTE — ED Triage Notes (Signed)
Pt c/o rt lower toothache since yesterday. States taking OTC meds and her oxycodone with no relief.

## 2022-07-15 ENCOUNTER — Other Ambulatory Visit (HOSPITAL_COMMUNITY): Payer: Self-pay

## 2022-07-15 DIAGNOSIS — Z79899 Other long term (current) drug therapy: Secondary | ICD-10-CM | POA: Diagnosis not present

## 2022-07-15 MED ORDER — PHENTERMINE HCL 30 MG PO CAPS
30.0000 mg | ORAL_CAPSULE | Freq: Every day | ORAL | 1 refills | Status: DC
  Filled 2022-07-15: qty 30, 30d supply, fill #0
  Filled 2022-09-10: qty 30, 30d supply, fill #1

## 2022-07-15 MED ORDER — OXYCODONE-ACETAMINOPHEN 10-325 MG PO TABS
1.0000 | ORAL_TABLET | Freq: Every day | ORAL | 0 refills | Status: DC
  Filled 2022-07-15: qty 180, 30d supply, fill #0

## 2022-07-15 MED ORDER — METFORMIN HCL 500 MG PO TABS
500.0000 mg | ORAL_TABLET | Freq: Every evening | ORAL | 0 refills | Status: DC
  Filled 2022-07-15: qty 90, 90d supply, fill #0

## 2022-07-15 MED ORDER — GABAPENTIN 800 MG PO TABS
800.0000 mg | ORAL_TABLET | Freq: Four times a day (QID) | ORAL | 0 refills | Status: DC
  Filled 2022-07-15: qty 120, 30d supply, fill #0

## 2022-07-16 ENCOUNTER — Other Ambulatory Visit (HOSPITAL_COMMUNITY): Payer: Self-pay

## 2022-07-17 DIAGNOSIS — Z79899 Other long term (current) drug therapy: Secondary | ICD-10-CM | POA: Diagnosis not present

## 2022-08-20 ENCOUNTER — Other Ambulatory Visit (HOSPITAL_COMMUNITY): Payer: Self-pay

## 2022-08-20 DIAGNOSIS — Z3490 Encounter for supervision of normal pregnancy, unspecified, unspecified trimester: Secondary | ICD-10-CM | POA: Diagnosis not present

## 2022-08-20 DIAGNOSIS — M25561 Pain in right knee: Secondary | ICD-10-CM | POA: Diagnosis not present

## 2022-08-20 DIAGNOSIS — G8929 Other chronic pain: Secondary | ICD-10-CM | POA: Diagnosis not present

## 2022-08-20 DIAGNOSIS — M25562 Pain in left knee: Secondary | ICD-10-CM | POA: Diagnosis not present

## 2022-08-20 DIAGNOSIS — M545 Low back pain, unspecified: Secondary | ICD-10-CM | POA: Diagnosis not present

## 2022-08-20 DIAGNOSIS — Z6841 Body Mass Index (BMI) 40.0 and over, adult: Secondary | ICD-10-CM | POA: Diagnosis not present

## 2022-08-20 DIAGNOSIS — Z79899 Other long term (current) drug therapy: Secondary | ICD-10-CM | POA: Diagnosis not present

## 2022-08-20 DIAGNOSIS — Z5320 Procedure and treatment not carried out because of patient's decision for unspecified reasons: Secondary | ICD-10-CM | POA: Diagnosis not present

## 2022-08-20 DIAGNOSIS — R030 Elevated blood-pressure reading, without diagnosis of hypertension: Secondary | ICD-10-CM | POA: Diagnosis not present

## 2022-08-20 DIAGNOSIS — G629 Polyneuropathy, unspecified: Secondary | ICD-10-CM | POA: Diagnosis not present

## 2022-08-20 MED ORDER — GABAPENTIN 800 MG PO TABS
800.0000 mg | ORAL_TABLET | Freq: Four times a day (QID) | ORAL | 0 refills | Status: DC
  Filled 2022-08-20: qty 120, 30d supply, fill #0

## 2022-08-20 MED ORDER — OXYCODONE-ACETAMINOPHEN 10-325 MG PO TABS
1.0000 | ORAL_TABLET | Freq: Every day | ORAL | 0 refills | Status: DC
  Filled 2022-08-20: qty 180, 30d supply, fill #0

## 2022-08-20 MED ORDER — NALOXONE HCL 4 MG/0.1ML NA LIQD
NASAL | 12 refills | Status: DC
  Filled 2022-08-20: qty 2, 1d supply, fill #0

## 2022-08-23 ENCOUNTER — Encounter (HOSPITAL_COMMUNITY): Payer: Self-pay

## 2022-08-23 ENCOUNTER — Inpatient Hospital Stay (HOSPITAL_COMMUNITY)
Admission: EM | Admit: 2022-08-23 | Discharge: 2022-08-23 | Disposition: A | Discharge status: Home / Self Care | Payer: Medicaid Other | Attending: Obstetrics & Gynecology | Admitting: Obstetrics & Gynecology

## 2022-08-23 DIAGNOSIS — Z8249 Family history of ischemic heart disease and other diseases of the circulatory system: Secondary | ICD-10-CM | POA: Insufficient documentation

## 2022-08-23 DIAGNOSIS — Z3A01 Less than 8 weeks gestation of pregnancy: Secondary | ICD-10-CM | POA: Diagnosis not present

## 2022-08-23 DIAGNOSIS — O161 Unspecified maternal hypertension, first trimester: Secondary | ICD-10-CM | POA: Diagnosis present

## 2022-08-23 DIAGNOSIS — O26891 Other specified pregnancy related conditions, first trimester: Secondary | ICD-10-CM | POA: Diagnosis not present

## 2022-08-23 DIAGNOSIS — Z79899 Other long term (current) drug therapy: Secondary | ICD-10-CM | POA: Insufficient documentation

## 2022-08-23 DIAGNOSIS — Z3A11 11 weeks gestation of pregnancy: Secondary | ICD-10-CM | POA: Diagnosis not present

## 2022-08-23 DIAGNOSIS — I10 Essential (primary) hypertension: Secondary | ICD-10-CM | POA: Diagnosis not present

## 2022-08-23 DIAGNOSIS — O131 Gestational [pregnancy-induced] hypertension without significant proteinuria, first trimester: Secondary | ICD-10-CM | POA: Diagnosis not present

## 2022-08-23 LAB — POCT PREGNANCY, URINE: Preg Test, Ur: POSITIVE — AB

## 2022-08-23 MED ORDER — AMLODIPINE BESYLATE 10 MG PO TABS: 10.0000 mg | ORAL_TABLET | Freq: Every day | ORAL | 1 refills | Status: DC

## 2022-08-23 NOTE — MAU Provider Note (Signed)
History     [id number]  Arrival date and time: 08/23/22 1313    Chief Complaint  Patient presents with   Fatigue   Headache   Hypertension     HPI Shirley [name] is a 32 y.o. at 22w0dwho presents for hypertension. Patient has history of hypertension - hasn't been on meds in over a year. Has been followed for the last month at a [facility] in preparation for pregnancy termination which they will not do due to her uncontrolled hypertension[place]  Reports fatigue & intermittent headaches for the last month. Currently no symptoms. Denies CP, SOB, weakness. Has a PCP but hasn't seen them recently.   No OB complaints.   OB History     Gravida  6   Para  3   Term  3   Preterm      AB  1   Living  3      SAB      IAB  1   Ectopic      Multiple  0   Live Births  3           Past Medical History:  Diagnosis Date   Anxiety    Arthritis    Back pain    Chest pain    Constipation    Depression    Edema, lower extremity    GERD (gastroesophageal reflux disease)    Hip dysplasia    Hypertension    Joint pain    Obesity    Osteoarthritis    [place]    Sciatica    Shortness of breath    Snapping hip syndrome     Past Surgical History:  Procedure Laterality Date   TOOTH EXTRACTION N/A 10/22/2013   Procedure: DENTAL EXTRACTIONS TEETH #1, 16, 17, 32;  Surgeon: SGae Bon DDS;  [facility]  Service: Oral Surgery;  Laterality: N/A;    Family History  Problem Relation Age of Onset   Hypertension Mother    Depression Mother    Anxiety disorder Mother    Obesity Mother    Diabetes Maternal Grandmother    Diabetes Paternal Grandmother    Stroke Paternal Grandmother    Hypertension Father     No Known Allergies  No current facility-administered medications on file prior to encounter.   Current Outpatient Medications on File Prior to Encounter  Medication Sig  Dispense Refill   metFORMIN (GLUCOPHAGE) 500 MG tablet Take 1 tablet (500 mg total) by mouth every evening with largest meal 90 tablet 0   baclofen (LIORESAL) 10 MG tablet Take 1 tablet (10 mg total) by mouth 2 (two) times daily. 30 each 0   Blood Pressure Monitoring DEVI 1 each by Does not apply route once a week. 1 each 0   cyclobenzaprine (FLEXERIL) 10 MG tablet Take 1 tablet (10 mg total) by mouth 3 (three) times daily as needed for muscle spasms. 90 tablet 0   gabapentin (NEURONTIN) 800 MG tablet Take 1 tablet (800 mg total) by mouth 4 (four) times daily for neuropathy. 120 tablet 0   naloxone (NARCAN) nasal spray 4 mg/0.1 mL If found unresponsive  spray into nose and call 911 immediately 1 each 12   naproxen (NAPROSYN) 500 MG tablet Take 1 tablet (500 mg total) by mouth 2 (two) times daily. 30 tablet 0   oxyCODONE-acetaminophen (PERCOCET) 10-325 MG tablet Take 1 tablet by mouth 6 (six) times  daily as needed for pain. 180 tablet 0   phentermine 15 MG capsule Take 1 capsule (15 mg total) by mouth daily. 30 capsule 1   phentermine 30 MG capsule Take 1 capsule (30 mg total) by mouth daily. 30 capsule 1     ROS Pertinent positives and negative per HPI, all others reviewed and negative  Physical Exam   BP (!) 167/94   Pulse 71   Temp 98.9 F (37.2 C) (Oral)   Resp 14   Ht '5\' 5"'$  (1.651 m)   Wt (!) 137.5 kg   LMP 06/07/2022   SpO2 99%   BMI 50.44 kg/m   Patient Vitals for the past 24 hrs:  BP Temp Temp src Pulse Resp SpO2 Height Weight  08/23/22 1432 (!) 167/94 -- -- 71 -- -- -- --  08/23/22 1413 (!) 192/128 98.9 F (37.2 C) Oral 84 14 99 % '5\' 5"'$  (1.651 m) (!) 137.5 kg  08/23/22 1411 -- -- -- -- -- 100 % -- --  08/23/22 1328 -- -- -- -- -- -- '5\' 5"'$  (1.651 m) 131.5 kg  08/23/22 1319 (!) 195/135 98.4 F (36.9 C) Oral 87 18 100 % -- --    Physical Exam Vitals and nursing note reviewed.  Constitutional:      General: She is not in acute distress.    Appearance: She is  well-developed. She is not ill-appearing.  HENT:     Head: Normocephalic and atraumatic.  Eyes:     General: No scleral icterus.       Right eye: No discharge.        Left eye: No discharge.     Conjunctiva/sclera: Conjunctivae normal.  Cardiovascular:     Rate and Rhythm: Normal rate and regular rhythm.     Heart sounds: Normal heart sounds.  Pulmonary:     Effort: Pulmonary effort is normal. No respiratory distress.     Breath sounds: Normal breath sounds.  Neurological:     General: No focal deficit present.     Mental Status: She is alert.  Psychiatric:        Mood and Affect: Mood normal.        Behavior: Behavior normal.       Labs Results for orders placed or performed during the hospital encounter of 08/23/22 (from the past 24 hour(s))  Pregnancy, urine POC     Status: Abnormal   Collection Time: 08/23/22  2:09 PM  Result Value Ref Range   Preg Test, Ur POSITIVE (A) NEGATIVE    Imaging No results found.  MAU Course  Procedures Lab Orders         Pregnancy, urine POC    Meds ordered this encounter  Medications   amLODipine (NORVASC) 10 MG tablet    Sig: Take 1 tablet (10 mg total) by mouth daily.    Dispense:  30 tablet    Refill:  1    Order Specific Question:   Supervising Provider    Answer:   Verita Schneiders A [id number]   Imaging Orders  No imaging studies ordered today    MDM No OB complaints today. Patient has f/u with termination on Monday  Reviewed previous records in epic. Patient has multiple visits with BPs >190/130. Currently is asymptomatic. Reviewed with Dr. Harolyn Rutherford who recommends starting on norvasc 10 - she previously took this medication per chart review.  Assessment and Plan   1. Essential hypertension    -Rx norvasc 10 mg daily. Encouraged patient  to schedule f/u with her PCP asap to manage chronic hypertension -Keep scheduled appointment in [place] on Monday -Reviewed reasons to present to ED including s/s stroke, MI,  hypertensive emergency.    Jorje Guild, NP 08/23/22 4:04 PM

## 2022-08-23 NOTE — ED Triage Notes (Addendum)
Pt c/o feeling fatigue x 1 month. Also reports HA and chills. Reports [redacted] weeks pregnant. Denies respiratory symptoms. Denies vaginal bleeding.

## 2022-08-23 NOTE — ED Provider Triage Note (Signed)
Emergency Medicine Provider Triage Evaluation Note  Shirley [name] , a 32 y.o. female  was evaluated in triage.  Pt complains of fatigue, poorly controlled blood pressure for a month, with blood pressure abnormality noted this morning.  Patient notes that she is [redacted] weeks pregnant with pregnancy confirmed by ultrasound is intrauterine.  Patient reports that she does have a history of high blood pressure but has not taken blood pressure medication before.  She denies any chest pain, nausea, vomiting, upper respiratory infectious symptoms.  Review of Systems  Positive: htn Negative: Abdominal pain  Physical Exam  BP (!) 195/135 (BP Location: Right Arm)   Pulse 87   Temp 98.4 F (36.9 C) (Oral)   Resp 18   Ht '5\' 5"'$  (1.651 m)   Wt 131.5 kg   LMP 06/07/2022   SpO2 100%   BMI 48.26 kg/m  Gen:   Awake, no distress   Resp:  Normal effort  MSK:   Moves extremities without difficulty  Other:    Medical Decision Making  Medically screening exam initiated at 1:39 PM.  Appropriate orders placed.  Shirley [name] was informed that the remainder of the evaluation will be completed by another provider, this initial triage assessment does not replace that evaluation, and the importance of remaining in the ED until their evaluation is complete.  Spoke with MAU provider Erin who accepts patient in transfer, will coordinate transport with MAU nurse   Anselmo Pickler, PA-C 08/23/22 1341

## 2022-08-23 NOTE — MAU Note (Signed)
Shirley [name] is a 32 y.o. at Unknown here in MAU reporting: just been feeling weak, tired and HA on and off.  Denies HA at this time, no pain or bleeding.  Reports elevated BP is a new problem.   Wasn't planning to go through with pregnancy, has been going to the clinic to take care of it, but her BP was so high they would not touch her.(First encounter mid Jan) LMP: 12/8 Onset of complaint: over a month. Pain score: none Vitals:   08/23/22 1411 08/23/22 1413  BP:  (!) 192/128  Pulse:  84  Resp:  14  Temp:  98.9 F (37.2 C)  SpO2: 100% 99%      Lab orders placed from triage:  UPT

## 2022-08-26 DIAGNOSIS — Z332 Encounter for elective termination of pregnancy: Secondary | ICD-10-CM | POA: Diagnosis not present

## 2022-08-26 DIAGNOSIS — O161 Unspecified maternal hypertension, first trimester: Secondary | ICD-10-CM | POA: Diagnosis not present

## 2022-08-26 DIAGNOSIS — Z79899 Other long term (current) drug therapy: Secondary | ICD-10-CM | POA: Diagnosis not present

## 2022-08-29 ENCOUNTER — Other Ambulatory Visit (HOSPITAL_COMMUNITY): Payer: Self-pay

## 2022-09-10 ENCOUNTER — Other Ambulatory Visit (HOSPITAL_COMMUNITY): Payer: Self-pay

## 2022-09-16 ENCOUNTER — Ambulatory Visit (INDEPENDENT_AMBULATORY_CARE_PROVIDER_SITE_OTHER): Payer: Medicaid Other | Admitting: Student

## 2022-09-16 ENCOUNTER — Other Ambulatory Visit (HOSPITAL_COMMUNITY): Payer: Self-pay

## 2022-09-16 ENCOUNTER — Encounter: Payer: Self-pay | Admitting: Student

## 2022-09-16 VITALS — BP 132/80 | HR 73 | Ht 65.0 in | Wt 307.6 lb

## 2022-09-16 DIAGNOSIS — I10 Essential (primary) hypertension: Secondary | ICD-10-CM | POA: Diagnosis not present

## 2022-09-16 DIAGNOSIS — Z6841 Body Mass Index (BMI) 40.0 and over, adult: Secondary | ICD-10-CM | POA: Diagnosis not present

## 2022-09-16 DIAGNOSIS — Z8742 Personal history of other diseases of the female genital tract: Secondary | ICD-10-CM | POA: Diagnosis not present

## 2022-09-16 DIAGNOSIS — Z79899 Other long term (current) drug therapy: Secondary | ICD-10-CM | POA: Diagnosis not present

## 2022-09-16 LAB — POCT GLYCOSYLATED HEMOGLOBIN (HGB A1C): Hemoglobin A1C: 5.6 % (ref 4.0–5.6)

## 2022-09-16 MED ORDER — OXYCODONE-ACETAMINOPHEN 10-325 MG PO TABS
1.0000 | ORAL_TABLET | Freq: Every day | ORAL | 0 refills | Status: DC
  Filled 2022-09-16 (×2): qty 180, 30d supply, fill #0

## 2022-09-16 MED ORDER — PHENTERMINE HCL 30 MG PO CAPS
30.0000 mg | ORAL_CAPSULE | Freq: Every day | ORAL | 0 refills | Status: DC
  Filled 2022-09-16 – 2022-10-16 (×2): qty 30, 30d supply, fill #0

## 2022-09-16 MED ORDER — METFORMIN HCL 500 MG PO TABS
500.0000 mg | ORAL_TABLET | Freq: Every evening | ORAL | 0 refills | Status: DC
  Filled 2022-09-16: qty 90, 90d supply, fill #0

## 2022-09-16 MED ORDER — GABAPENTIN 800 MG PO TABS
ORAL_TABLET | ORAL | 0 refills | Status: DC
  Filled 2022-09-16: qty 120, 30d supply, fill #0

## 2022-09-16 NOTE — Progress Notes (Unsigned)
    SUBJECTIVE:   CHIEF COMPLAINT / HPI:    HTN Previously prescribed Amlodipine d/t HTN in early pregnancy which was terminated last month. Has not taken it in about 2 weeks. Does not check BP at home.   S/p termination of pregnancy  Legal abortion completed 08/26/2022. No pain, very little vaginal bleeding currently. No fevers. Has not started any method of contraception, wanted to think about it first. Is concerned about weight gain with birth control. Is familiar with birth control options, did go over them previously after termination of pregnancy. Does not want to start anything today.   Obesity Currently taking Phentermine 30 mg daily which was prescribed by pain clinic. Had stopped when she became pregnant but recently resumed. Has a stair stepper at home, in the past has used it about 2x/week but hasn't been recently. States she and her daughter (also wanting to loose weight) try to eat healthy but she does not feel it helps. She is not interested in discussing options in the community or with [facility] for weight loss programs for her and family. Does take metformin (not diabetic) 500 mg every evening.   PERTINENT  PMH / PSH: HTN, obesity  OBJECTIVE:   BP 132/80   Pulse 73   Ht 5\' 5"  (1.651 m)   Wt (!) 307 lb 9.6 oz (139.5 kg)   LMP 06/07/2022   SpO2 94%   BMI 51.19 kg/m    General: NAD, pleasant, able to participate in exam Cardiac: well perfused Respiratory: breathing comfortably on RA Skin: warm and dry Neuro: alert, no obvious focal deficits Psych: Normal affect and mood  ASSESSMENT/PLAN:   Essential hypertension BP 132/80 in office today off of Amlodipine and s/p termination of pregnancy a few weeks ago. Pt advised to purchase BP cuff for home, record it daily and bring to appointment in 2 weeks. Will resume Amlodipine if indicated at next visit.  BMP today.   Class 3 severe obesity with serious comorbidity and body mass index (BMI) of 50.0 to 59.9 in adult  [facility]) Pt advised to stop taking phentermine while we are monitoring her BP and need for BP lowering meds. Once BP is under control we can resume conversation about risks and benefits of starting phentermine. Pt given handout on diabetic diet to look over and advised to consider following it along with the goal of doing her stair stepper 3x per week. HgA1c WNL today. She can continue the metformin and further increase it to BID as it may help with weight loss.   History of termination of pregnancy Pt is doing well s/p termination a few weeks ago with minimal vaginal bleeding, no abdominal pain and no fever. We did have a thorough discussion about the different options for birth control. She prefers to think about the options and resume the conversation when she returns in the next month or so for her pap smear and annual exam.      Dr. Precious Gilding, [place]

## 2022-09-16 NOTE — Patient Instructions (Addendum)
It was great to see you! Thank you for allowing me to participate in your care!  I recommend that you always bring your medications to each appointment as this makes it easy to ensure you are on the correct medications and helps [place] not miss when refills are needed.  Our plans for today:  - STOP taking phentermine until we get your blood pressure under control and decide what medications if any you need to be on -Please pick up a blood pressure monitor at the pharmacy.  Record your blood pressure daily and bring these recordings to your next visit in about 2 weeks -Return in 2 weeks to discuss blood pressure and a Pap smear -See the attached information regarding diabetes and nutrition -I encouraged you to exercise 3 times a week for about 20 minutes at a time  We are checking some labs today, I will call you if they are abnormal will send you a MyChart message or a letter if they are normal.  If you do not hear about your labs in the next 2 weeks please let us know.  Take care and seek immediate care sooner if you develop any concerns.   Dr. Precious Gilding, DO [facility]

## 2022-09-17 ENCOUNTER — Other Ambulatory Visit (HOSPITAL_COMMUNITY): Payer: Self-pay

## 2022-09-17 ENCOUNTER — Encounter: Payer: Self-pay | Admitting: Student

## 2022-09-17 DIAGNOSIS — Z8742 Personal history of other diseases of the female genital tract: Secondary | ICD-10-CM | POA: Insufficient documentation

## 2022-09-17 HISTORY — DX: Personal history of other diseases of the female genital tract: Z87.42

## 2022-09-17 LAB — BASIC METABOLIC PANEL
BUN/Creatinine Ratio: 23 (ref 9–23)
BUN: 20 mg/dL (ref 6–20)
CO2: 23 mmol/L (ref 20–29)
Calcium: 8.7 mg/dL (ref 8.7–10.2)
Chloride: 100 mmol/L (ref 96–106)
Creatinine, Ser: 0.86 mg/dL (ref 0.57–1.00)
Glucose: 69 mg/dL — ABNORMAL LOW (ref 70–99)
Potassium: 3.7 mmol/L (ref 3.5–5.2)
Sodium: 138 mmol/L (ref 134–144)
eGFR: 91 mL/min/{1.73_m2} (ref >59)

## 2022-09-17 NOTE — Assessment & Plan Note (Addendum)
BP 132/80 in office today off of Amlodipine and s/p termination of pregnancy a few weeks ago. Pt advised to purchase BP cuff for home, record it daily and bring to appointment in 2 weeks. Will resume Amlodipine if indicated at next visit.  BMP today.

## 2022-09-17 NOTE — Assessment & Plan Note (Signed)
Pt is doing well s/p termination a few weeks ago with minimal vaginal bleeding, no abdominal pain and no fever. We did have a thorough discussion about the different options for birth control. She prefers to think about the options and resume the conversation when she returns in the next month or so for her pap smear and annual exam.

## 2022-09-17 NOTE — Assessment & Plan Note (Addendum)
Pt advised to stop taking phentermine while we are monitoring her BP and need for BP lowering meds. Once BP is under control we can resume conversation about risks and benefits of starting phentermine. Pt given handout on diabetic diet to look over and advised to consider following it along with the goal of doing her stair stepper 3x per week. HgA1c WNL today. She can continue the metformin and further increase it to BID as it may help with weight loss.

## 2022-09-18 DIAGNOSIS — Z79899 Other long term (current) drug therapy: Secondary | ICD-10-CM | POA: Diagnosis not present

## 2022-09-26 ENCOUNTER — Other Ambulatory Visit (HOSPITAL_COMMUNITY): Payer: Self-pay

## 2022-10-16 ENCOUNTER — Other Ambulatory Visit: Payer: Self-pay

## 2022-10-16 ENCOUNTER — Other Ambulatory Visit (HOSPITAL_COMMUNITY): Payer: Self-pay

## 2022-10-17 ENCOUNTER — Other Ambulatory Visit (HOSPITAL_COMMUNITY): Payer: Self-pay

## 2022-10-17 DIAGNOSIS — Z79899 Other long term (current) drug therapy: Secondary | ICD-10-CM | POA: Diagnosis not present

## 2022-10-17 MED ORDER — PHENTERMINE HCL 30 MG PO CAPS
30.0000 mg | ORAL_CAPSULE | Freq: Every day | ORAL | 0 refills | Status: DC
  Filled 2022-10-17: qty 15, 15d supply, fill #0

## 2022-10-17 MED ORDER — OXYCODONE-ACETAMINOPHEN 10-325 MG PO TABS
1.0000 | ORAL_TABLET | Freq: Every day | ORAL | 0 refills | Status: DC | PRN
  Filled 2022-10-17: qty 75, 16d supply, fill #0

## 2022-10-17 MED ORDER — GABAPENTIN 800 MG PO TABS
800.0000 mg | ORAL_TABLET | Freq: Four times a day (QID) | ORAL | 0 refills | Status: DC
  Filled 2022-10-17: qty 60, 15d supply, fill #0

## 2022-10-17 MED ORDER — METFORMIN HCL 500 MG PO TABS
500.0000 mg | ORAL_TABLET | Freq: Every evening | ORAL | 0 refills | Status: DC
  Filled 2022-10-17: qty 90, 90d supply, fill #0

## 2022-10-21 DIAGNOSIS — Z79899 Other long term (current) drug therapy: Secondary | ICD-10-CM | POA: Diagnosis not present

## 2022-10-31 ENCOUNTER — Other Ambulatory Visit (HOSPITAL_COMMUNITY): Payer: Self-pay

## 2022-10-31 DIAGNOSIS — R7303 Prediabetes: Secondary | ICD-10-CM | POA: Diagnosis not present

## 2022-10-31 DIAGNOSIS — Z79899 Other long term (current) drug therapy: Secondary | ICD-10-CM | POA: Diagnosis not present

## 2022-10-31 MED ORDER — CYCLOBENZAPRINE HCL 10 MG PO TABS
10.0000 mg | ORAL_TABLET | Freq: Three times a day (TID) | ORAL | 0 refills | Status: DC | PRN
  Filled 2022-10-31: qty 45, 15d supply, fill #0

## 2022-10-31 MED ORDER — PHENTERMINE HCL 30 MG PO CAPS
30.0000 mg | ORAL_CAPSULE | Freq: Every day | ORAL | 0 refills | Status: DC
  Filled 2022-10-31: qty 15, 15d supply, fill #0

## 2022-10-31 MED ORDER — GABAPENTIN 800 MG PO TABS
800.0000 mg | ORAL_TABLET | Freq: Four times a day (QID) | ORAL | 0 refills | Status: DC
  Filled 2022-10-31: qty 60, 15d supply, fill #0

## 2022-10-31 MED ORDER — OXYCODONE-ACETAMINOPHEN 10-325 MG PO TABS
1.0000 | ORAL_TABLET | Freq: Every day | ORAL | 0 refills | Status: DC | PRN
  Filled 2022-10-31: qty 75, 15d supply, fill #0

## 2022-11-04 DIAGNOSIS — Z79899 Other long term (current) drug therapy: Secondary | ICD-10-CM | POA: Diagnosis not present

## 2022-11-19 ENCOUNTER — Other Ambulatory Visit (HOSPITAL_COMMUNITY): Payer: Self-pay

## 2022-11-19 DIAGNOSIS — Z79899 Other long term (current) drug therapy: Secondary | ICD-10-CM | POA: Diagnosis not present

## 2022-11-19 MED ORDER — GABAPENTIN 800 MG PO TABS
800.0000 mg | ORAL_TABLET | Freq: Four times a day (QID) | ORAL | 0 refills | Status: DC
  Filled 2022-11-19 – 2022-12-03 (×2): qty 120, 30d supply, fill #0

## 2022-11-19 MED ORDER — CYCLOBENZAPRINE HCL 10 MG PO TABS
10.0000 mg | ORAL_TABLET | Freq: Three times a day (TID) | ORAL | 0 refills | Status: DC | PRN
  Filled 2022-11-19: qty 63, 21d supply, fill #0

## 2022-11-19 MED ORDER — OXYCODONE-ACETAMINOPHEN 10-325 MG PO TABS
1.0000 | ORAL_TABLET | Freq: Every day | ORAL | 0 refills | Status: DC
  Filled 2022-11-19: qty 180, 30d supply, fill #0

## 2022-11-19 MED ORDER — PHENTERMINE HCL 30 MG PO CAPS
30.0000 mg | ORAL_CAPSULE | Freq: Every day | ORAL | 0 refills | Status: DC
  Filled 2022-11-19: qty 30, 30d supply, fill #0

## 2022-11-19 MED ORDER — METFORMIN HCL 500 MG PO TABS: 500.0000 mg | ORAL_TABLET | Freq: Every evening | ORAL | 0 refills | Status: DC

## 2022-11-20 ENCOUNTER — Other Ambulatory Visit (HOSPITAL_COMMUNITY): Payer: Self-pay

## 2022-11-21 DIAGNOSIS — Z79899 Other long term (current) drug therapy: Secondary | ICD-10-CM | POA: Diagnosis not present

## 2022-12-03 ENCOUNTER — Other Ambulatory Visit (HOSPITAL_COMMUNITY): Payer: Self-pay

## 2022-12-10 ENCOUNTER — Other Ambulatory Visit (HOSPITAL_COMMUNITY): Payer: Self-pay

## 2022-12-12 NOTE — Progress Notes (Deleted)
    SUBJECTIVE:   CHIEF COMPLAINT / HPI:   Low back pain with sciatica Very chronic issue.  Past imaging has revealed degenerative disc disease at L5-S1.  PERTINENT  PMH / PSH: ***  OBJECTIVE:   LMP 06/07/2022  ***  General: NAD, pleasant, able to participate in exam Cardiac: RRR, no murmurs. Respiratory: CTAB, normal effort, No wheezes, rales or rhonchi Abdomen: Bowel sounds present, nontender, nondistended, no hepatosplenomegaly. Extremities: no edema or cyanosis. Skin: warm and dry, no rashes noted Neuro: alert, no obvious focal deficits Psych: Normal affect and mood  ASSESSMENT/PLAN:   No problem-specific Assessment & Plan notes found for this encounter.     Dr. Erick Alley, DO [facility]    {    This will disappear when note is signed, click to select method of visit    :1}

## 2022-12-13 ENCOUNTER — Ambulatory Visit: Payer: Medicaid Other | Admitting: Student

## 2022-12-20 ENCOUNTER — Other Ambulatory Visit (HOSPITAL_COMMUNITY): Payer: Self-pay

## 2022-12-20 ENCOUNTER — Encounter: Payer: Self-pay | Admitting: Family Medicine

## 2022-12-20 ENCOUNTER — Ambulatory Visit (INDEPENDENT_AMBULATORY_CARE_PROVIDER_SITE_OTHER): Payer: Medicaid Other | Admitting: Family Medicine

## 2022-12-20 VITALS — BP 171/106 | HR 63 | Ht 66.0 in | Wt 300.4 lb

## 2022-12-20 DIAGNOSIS — M5441 Lumbago with sciatica, right side: Secondary | ICD-10-CM | POA: Diagnosis present

## 2022-12-20 DIAGNOSIS — Z79899 Other long term (current) drug therapy: Secondary | ICD-10-CM | POA: Diagnosis not present

## 2022-12-20 DIAGNOSIS — I10 Essential (primary) hypertension: Secondary | ICD-10-CM | POA: Diagnosis not present

## 2022-12-20 DIAGNOSIS — G8929 Other chronic pain: Secondary | ICD-10-CM

## 2022-12-20 MED ORDER — PHENTERMINE HCL 30 MG PO CAPS
30.0000 mg | ORAL_CAPSULE | Freq: Every day | ORAL | 0 refills | Status: DC
  Filled 2022-12-20: qty 30, 30d supply, fill #0

## 2022-12-20 MED ORDER — CYCLOBENZAPRINE HCL 10 MG PO TABS
10.0000 mg | ORAL_TABLET | Freq: Three times a day (TID) | ORAL | 0 refills | Status: DC | PRN
  Filled 2022-12-20: qty 63, 21d supply, fill #0

## 2022-12-20 MED ORDER — GABAPENTIN 800 MG PO TABS: 800.0000 mg | ORAL_TABLET | Freq: Four times a day (QID) | ORAL | 0 refills | Status: DC

## 2022-12-20 MED ORDER — METFORMIN HCL 500 MG PO TABS
500.0000 mg | ORAL_TABLET | Freq: Every day | ORAL | 0 refills | Status: DC
  Filled 2022-12-20: qty 30, 30d supply, fill #0

## 2022-12-20 MED ORDER — OXYCODONE-ACETAMINOPHEN 10-325 MG PO TABS
ORAL_TABLET | ORAL | 0 refills | Status: DC
  Filled 2022-12-20: qty 70, 28d supply, fill #0

## 2022-12-20 NOTE — Patient Instructions (Addendum)
It was nice seeing you today!  I have placed a referral for pain management.  Stay well, Littie Deeds, MD [facility] [phone]  --  Make sure to check out at the front desk before you leave today.  Please arrive at least 15 minutes prior to your scheduled appointments.  If you had blood work today, I will send you a MyChart message or a letter if results are normal. Otherwise, I will give you a call.  If you had a referral placed, they will call you to set up an appointment. Please give [place] a call if you don't hear back in the next 2 weeks.  If you need additional refills before your next appointment, please call your pharmacy first.

## 2022-12-20 NOTE — Progress Notes (Signed)
SUBJECTIVE:   CHIEF COMPLAINT / HPI:  Chief Complaint  Patient presents with   Referral    Pain Managment    On chart review, patient was evaluated for spinal stenosis 05/2020, placed new referral for neurosurgery and referred for PT at that time.  Patient reports that she has been dealing with chronic back pain ever since the age of 42. Patient had been going to pain management at [facility]; however, reports she was dismissed because of THC use. Apparently, she was told she needed to stop THC by February, so she had quit THC. It seemed like this was a clinic policy. Patient was on oxycodone, gabapentin, phentermine. Last visit with pain management was earlier today.  She was told she will need to get a new referral from her PCP so she made the appointment today. She has done physical therapy in the past for her back and continues to do home exercises. She feels like with the pain management, she is better able to function and get around.  Wants to get bariatric surgery.  Patient reports that she has not had any issues with blood pressure since her pregnancy was terminated back in February.  She reports that her blood pressure earlier today at the pain management office was in the 130s systolic. She has not been checking her blood pressure readings lately.  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 Cardiovascular:     Rate and Rhythm: Normal rate and regular rhythm.  Pulmonary:     Effort: Pulmonary effort is normal. No respiratory distress.     Breath sounds: Normal breath sounds.  Musculoskeletal:     Cervical  back: Neck supple.  Neurological:     Mental Status: She is alert.         12/20/2022    2:07 PM  Depression screen PHQ 2/9  Decreased Interest 2  Down, Depressed, Hopeless 2  PHQ - 2 Score 4  Altered sleeping 1  Tired, decreased energy 2  Change in appetite 1  Feeling bad or failure about yourself  1  Trouble concentrating 1  Moving slowly or fidgety/restless 0  Suicidal thoughts 0  PHQ-9 Score 10  Difficult doing work/chores Extremely dIfficult     Wt Readings from Last 3 Encounters:  12/20/22 (!) 300 lb 6 oz (136.2 kg)  09/16/22 (!) 307 lb 9.6 oz (139.5 kg)  08/23/22 (!) 303 lb 1.6 oz (137.5 kg)        ASSESSMENT/PLAN:   Problem List Items Addressed This Visit       Cardiovascular and Mediastinum   Essential hypertension    Significantly hypertensive in office today, though normotensive at last office visit here and reports normotensive at recent pain management visit earlier today.  Previously on amlodipine but was recently taken off of this. - discussed restarting amlodipine versus continuing to monitor BP at home; she would like to continue to monitor, so I advised her to check her BP at least once a day for the next week and  send a MyChart of her BP readings - f/u 1 month        Other   Chronic low back pain - Primary    Has been followed by [facility] pain management but unfortunately they have dismissed her due to finding THC in her urine, though she has quit using THC earlier this year.  This could possibly be a false positive given long-term use, but nevertheless she has been dismissed.  She reports benefit from pain management, so will place referral to different pain management practice.      Relevant Orders   Ambulatory referral to Pain Clinic       Return in about 4 weeks (around 01/17/2023) for f/u HTN.   Littie Deeds, MD [facility]

## 2022-12-20 NOTE — Assessment & Plan Note (Signed)
Has been followed by [facility] pain management but unfortunately they have dismissed her due to finding THC in her urine, though she has quit using THC earlier this year.  This could possibly be a false positive given long-term use, but nevertheless she has been dismissed.  She reports benefit from pain management, so will place referral to different pain management practice.

## 2022-12-20 NOTE — Assessment & Plan Note (Signed)
Significantly hypertensive in office today, though normotensive at last office visit here and reports normotensive at recent pain management visit earlier today.  Previously on amlodipine but was recently taken off of this. - discussed restarting amlodipine versus continuing to monitor BP at home; she would like to continue to monitor, so I advised her to check her BP at least once a day for the next week and send a MyChart of her BP readings - f/u 1 month

## 2022-12-24 DIAGNOSIS — Z79899 Other long term (current) drug therapy: Secondary | ICD-10-CM | POA: Diagnosis not present

## 2023-01-17 ENCOUNTER — Telehealth: Payer: Self-pay

## 2023-01-17 NOTE — Telephone Encounter (Signed)
Patient LVM requesting referral information.   I attempted to call patient back to gather more information, however she did not answer.   I do see a referral was made for pain management.   If she calls back please give her office information. She can call them directly to make an apt.   [facility] [address] [place], [place] [address] [phone]

## 2023-02-03 ENCOUNTER — Ambulatory Visit (INDEPENDENT_AMBULATORY_CARE_PROVIDER_SITE_OTHER): Payer: Medicaid Other | Admitting: Family Medicine

## 2023-02-03 ENCOUNTER — Ambulatory Visit: Payer: Medicaid Other | Admitting: Student

## 2023-02-03 VITALS — BP 193/122 | HR 80 | Ht 66.0 in | Wt 291.0 lb

## 2023-02-03 DIAGNOSIS — I10 Essential (primary) hypertension: Secondary | ICD-10-CM

## 2023-02-03 DIAGNOSIS — Z3201 Encounter for pregnancy test, result positive: Secondary | ICD-10-CM

## 2023-02-03 LAB — POCT URINE PREGNANCY: Preg Test, Ur: POSITIVE — AB

## 2023-02-03 MED ORDER — AMLODIPINE BESYLATE 10 MG PO TABS
10.0000 mg | ORAL_TABLET | Freq: Every day | ORAL | 1 refills | Status: DC
Start: 2023-02-03 — End: 2023-02-24

## 2023-02-03 NOTE — Progress Notes (Signed)
  SUBJECTIVE:   CHIEF COMPLAINT / HPI:   Reported OB hx - [id number]  Reports positive pregnancy test, here to confirm  LMP: June 27th  Denies bleeding since her LMP.  She has some intermittent belly cramps but no severe abdominal pain  BP elevated today 177/122 - denies severe headaches, vision changes, CP, SOB, leg swelling  Current meds: phentermine (hasn't taken in 1-2 weeks), metformin, gabapentin, has not taken percocet in past month  She does currently smoke cigarettes and marijuana, doesn't drink much alcohol. She's planning to stop everything during her pregnancy.   PERTINENT  PMH / PSH:   Past Medical History:  Diagnosis Date   Anxiety    Arthritis    Back pain    Chest pain    Constipation    Depression    Edema, lower extremity    GERD (gastroesophageal reflux disease)    Hip dysplasia    Hypertension    Joint pain    Obesity    Osteoarthritis    Rheumatoid arthritis (HCC)    Sciatica    Shortness of breath    Snapping hip syndrome     OBJECTIVE:  BP (!) 193/122   Pulse 80   Ht 5\' 6"  (1.676 m)   Wt 291 lb (132 kg)   LMP 12/26/2022   SpO2 98%   BMI 46.97 kg/m   General: NAD, pleasant, able to participate in exam Cardiac: RRR, no murmurs auscultated Respiratory: CTAB, normal WOB Abdomen: soft, non-tender, non-distended, normoactive bowel sounds Extremities: warm and well perfused, no edema or cyanosis Skin: warm and dry, no rashes noted Neuro: alert, no obvious focal deficits, speech normal Psych: Normal affect and mood  ASSESSMENT/PLAN:   1. Positive pregnancy test Positive pregnancy test today in clinic.  LMP around June 27 which puts her at about [redacted]w[redacted]d.  Collecting initial prenatal labs today, advised to schedule initial prenatal visit.  Her blood pressure is elevated as below but I suspect this is longstanding primary hypertension rather than gestational hypertension.  Reassuringly asymptomatic.  Counseled about prenatal vitamin, avoiding all  other medications except amlodipine for now.  Counseled about smoking/alcohol cessation. - POCT urine pregnancy - CBC/D/Plt+RPR+Rh+ABO+RubIgG... - Hgb Fractionation Cascade  2. Primary hypertension BP elevated to 193/122 today, reassuringly asymptomatic, likely longstanding hypertension.  She was previously on amlodipine which she has not been taking for a long time.  Will restart this today and anticipate she will need close management of her blood pressure moving forward.  Revisit this at her initial prenatal. - amLODipine (NORVASC) 10 MG tablet; Take 1 tablet (10 mg total) by mouth daily.  Dispense: 30 tablet; Refill: 1  Meds ordered this encounter  Medications   amLODipine (NORVASC) 10 MG tablet    Sig: Take 1 tablet (10 mg total) by mouth daily.    Dispense:  30 tablet    Refill:  1   Return in about 1 week (around 02/10/2023) for initial prenatal.  Vonna Drafts, MD [facility]

## 2023-02-03 NOTE — Patient Instructions (Addendum)
Please start taking a prenatal vitamin daily  Please stop taking all other medications except for amlodipine for blood pressure  Please schedule your initial prenatal visit  Please seek medical attention if you start having severe headaches, vision changes, chest pain, shortness of breath, abdominal pain, vaginal bleeding  I will let you know if any of your blood work from today is abnormal.  Otherwise, we will talk about it at your first prenatal visit.

## 2023-02-13 ENCOUNTER — Other Ambulatory Visit (HOSPITAL_COMMUNITY): Payer: Self-pay

## 2023-02-13 ENCOUNTER — Other Ambulatory Visit: Payer: Self-pay | Admitting: Internal Medicine

## 2023-02-13 DIAGNOSIS — R7303 Prediabetes: Secondary | ICD-10-CM | POA: Diagnosis not present

## 2023-02-13 DIAGNOSIS — Z0000 Encounter for general adult medical examination without abnormal findings: Secondary | ICD-10-CM | POA: Diagnosis not present

## 2023-02-13 DIAGNOSIS — E559 Vitamin D deficiency, unspecified: Secondary | ICD-10-CM | POA: Diagnosis not present

## 2023-02-13 DIAGNOSIS — G894 Chronic pain syndrome: Secondary | ICD-10-CM | POA: Diagnosis not present

## 2023-02-13 DIAGNOSIS — Z6841 Body Mass Index (BMI) 40.0 and over, adult: Secondary | ICD-10-CM | POA: Diagnosis not present

## 2023-02-13 DIAGNOSIS — Z7189 Other specified counseling: Secondary | ICD-10-CM | POA: Diagnosis not present

## 2023-02-13 DIAGNOSIS — I10 Essential (primary) hypertension: Secondary | ICD-10-CM | POA: Diagnosis not present

## 2023-02-14 LAB — COMPLETE METABOLIC PANEL WITH GFR
AG Ratio: 1.1 (calc) (ref 1.0–2.5)
ALT: 9 U/L (ref 6–29)
AST: 11 U/L (ref 10–30)
Albumin: 3.9 g/dL (ref 3.6–5.1)
Alkaline phosphatase (APISO): 95 U/L (ref 31–125)
BUN: 10 mg/dL (ref 7–25)
CO2: 23 mmol/L (ref 20–32)
Calcium: 9.6 mg/dL (ref 8.6–10.2)
Chloride: 100 mmol/L (ref 98–110)
Creat: 0.94 mg/dL (ref 0.50–0.97)
Globulin: 3.7 g/dL (ref 1.9–3.7)
Glucose, Bld: 84 mg/dL (ref 65–99)
Potassium: 4 mmol/L (ref 3.5–5.3)
Sodium: 136 mmol/L (ref 135–146)
Total Bilirubin: 0.3 mg/dL (ref 0.2–1.2)
Total Protein: 7.6 g/dL (ref 6.1–8.1)
eGFR: 82 mL/min/{1.73_m2} (ref >=60)

## 2023-02-14 LAB — CBC
HCT: 36.7 % (ref 35.0–45.0)
Hemoglobin: 11.8 g/dL (ref 11.7–15.5)
MCH: 25.8 pg — ABNORMAL LOW (ref 27.0–33.0)
MCHC: 32.2 g/dL (ref 32.0–36.0)
MCV: 80.1 fL (ref 80.0–100.0)
MPV: 9.5 fL (ref 7.5–12.5)
Platelets: 303 10*3/uL (ref 140–400)
RBC: 4.58 10*6/uL (ref 3.80–5.10)
RDW: 14.1 % (ref 11.0–15.0)
WBC: 10.8 10*3/uL (ref 3.8–10.8)

## 2023-02-14 LAB — LIPID PANEL
Cholesterol: 119 mg/dL (ref <200)
HDL: 43 mg/dL — ABNORMAL LOW (ref >=50)
LDL Cholesterol (Calc): 63 mg/dL
Non-HDL Cholesterol (Calc): 76 mg/dL (ref <130)
Total CHOL/HDL Ratio: 2.8 (calc) (ref <5.0)
Triglycerides: 47 mg/dL (ref <150)

## 2023-02-14 LAB — VITAMIN D 25 HYDROXY (VIT D DEFICIENCY, FRACTURES): Vit D, 25-Hydroxy: 21 ng/mL — ABNORMAL LOW (ref 30–100)

## 2023-02-14 LAB — TSH: TSH: 1.1 m[IU]/L

## 2023-02-18 ENCOUNTER — Encounter: Payer: Medicaid Other | Admitting: Student

## 2023-02-18 NOTE — Progress Notes (Deleted)
Patient Name: Shirley [name] Date of Birth: [birth year] [facility] Initial Prenatal Visit  Shirley [name] is a 32 y.o. year old [id number] at [redacted]w[redacted]d who presents for her initial prenatal visit. Pregnancy {Is/is not:9024} planned She reports {pregnancy symptoms:18128}. She {is/is not:320031::"is"} taking a prenatal vitamin.  She denies pelvic pain or vaginal bleeding.   Pregnancy Dating: The patient is dated by ***.  LMP: *** Period is certain:  {yes/no:20286}.  Periods were regular:  {yes/no:20286}.  LMP was a typical period:  {yes/no:20286}.  Using hormonal contraception in 3 months prior to conception: {yes/no:20286}  Lab Review: Blood type: O Rh Status: {Rh status :23298::"+"} Antibody screen: {NEGATIVE/POSITIVE FOR:19998::"Negative"} HIV: {NEGATIVE/POSITIVE FOR:19998::"Negative"} RPR: {NEGATIVE/APPRO/POSITIVE FOR:20006::"Negative"} Hemoglobin electrophoresis reviewed: {yes/no:20286::"Yes"} Results of OB urine culture are: {NEGATIVE/POSITIVE FOR:19998::"Negative"} Rubella: {Desc; immune/not/unknown:31571::"Immune"} Hep C Ab: {NEGATIVE/POSITIVE FOR:19998::"Negative"} Varicella status is {Desc; immune/not/unknown:31571::"Immune"}  PMH: Reviewed and as detailed below: HTN: {yes/no:20286::"No"}  Gestational Hypertension/preeclampsia: {yes/no:20286::"No"}  Type 1 or 2 Diabetes: {yes/no:20286::"No"}  Depression:  {yes/no:20286::"No"}  Seizure disorder:  {yes/no:20286::"No"} VTE: {yes/no:20286::"No"} ,  History of STI {yes/no:20286::"No"},  Abnormal Pap smear:  {yes/no:20286::"No"}, Genital herpes simplex:  {yes/no:20286::"No"}   PSH: Gynecologic Surgery:  {No/  **:31982:o:"no"} Surgical history reviewed, notable for: ***  Obstetric History: Obstetric history tab updated and reviewed.  Summary of prior pregnancies: *** Cesarean delivery: {yes/no:20286::"No"}  Gestational Diabetes:  {yes/no:20286::"No"} Hypertension in pregnancy:  {yes/no:20286::"No"} History of preterm birth: {yes/no:20286::"No"} History of LGA/SGA infant:  {yes/no:20286::"No"} History of shoulder dystocia: {yes/no:20286::"No"} Indications for referral were reviewed, and the patient has no obstetric indications for referral to High Risk OB Clinic at this time.   Social History: Partner's name: ***  Tobacco use: {yes/no:20286::"No"} Alcohol use:  {yes/no:20286::"No"} Other substance use:  {yes/no:20286::"No"}  Current Medications:  ***  Reviewed and appropriate in pregnancy.   Genetic and Infection Screen: Flow Sheet Updated {yes/no:20286::"Yes"}  Prenatal Exam: Gen: Well nourished, well developed.  No distress.  Vitals noted. HEENT: Normocephalic, atraumatic.  Neck supple without cervical lymphadenopathy, thyromegaly or thyroid nodules.  Fair dentition. CV: RRR no murmur, gallops or rubs Lungs: CTA B.  Normal respiratory effort without wheezes or rales. Abd: soft, NTND. +BS.  Uterus not appreciated above pelvis. GU: Normal external female genitalia without lesions.  Nl vaginal, well rugated without lesions. No vaginal discharge.  Bimanual exam: No adnexal mass or TTP. No CMT.  Uterus size *** Ext: No clubbing, cyanosis or edema. Psych: Normal grooming and dress.  Not depressed or anxious appearing.  Normal thought content and process without flight of ideas or looseness of associations  Fetal heart tones: {appropriate:23337::"Appropriate"}  Assessment/Plan:  Shirley [name] is a 32 y.o. [phone] at 101w4d who presents to initiate prenatal care. She is doing well.  Current pregnancy issues include ***.  Routine prenatal care: As dating {ACTION; IS/IS NOT:21021397::"is not"} reliable, a dating ultrasound {HAS HAS NOT:18834::"has"} been ordered. Dating tab updated. Pre-pregnancy weight updated. Expected weight gain this pregnancy is {weight gain pregnancy :23296::"25-35 pounds "} Prenatal labs reviewed, notable for ***. Indications for  referral to HROB were reviewed and the patient {DOES NOT does:27190::"does not"} meet criteria for referral.  Medication list reviewed and updated.  Recommended patient see a dentist for regular care.  Bleeding and pain precautions reviewed. Importance of prenatal vitamins reviewed.  Genetic screening offered. Patient opted for: {obgeneticscreen:23414}. The patient has the following indications for aspirinto begin 81 mg at 12-16 weeks: One high risk condition: {fmcaspirinobhigh:26167} MORE than one moderate risk condition: {fmcaspirinobmoderate:26168} Aspirin {WAS/WAS NOT:[phone]::"was not"}  recommended  today based upon above risk factors (one high risk condition or more than one moderate risk factor)  The patient {will/will not be:23415} age 28 or over at time of delivery. Referral to genetic counseling {WAS/WAS NOT:[phone]::"was not"} offered today.  The patient has the following risk factors for preexisting diabetes: {Pre-existing diabetes screening:23343::"Reviewed indications for early 1 hour glucose testing, not indicated "}. An early 1 hour glucose tolerance test {WAS/WAS NOT:[phone]::"was not"} ordered. Pregnancy Medical Home and PHQ-9 forms completed, problems noted: {yes/no:20286}  2. Pregnancy issues include the following which were addressed today:  ***   Follow up 4 weeks for next prenatal visit.

## 2023-02-24 ENCOUNTER — Inpatient Hospital Stay (HOSPITAL_COMMUNITY)
Admission: AD | Admit: 2023-02-24 | Discharge: 2023-02-24 | Disposition: A | Discharge status: Home / Self Care | Payer: Medicaid Other | Attending: Obstetrics & Gynecology | Admitting: Obstetrics & Gynecology

## 2023-02-24 ENCOUNTER — Other Ambulatory Visit: Payer: Self-pay

## 2023-02-24 ENCOUNTER — Inpatient Hospital Stay (HOSPITAL_COMMUNITY): Payer: Medicaid Other

## 2023-02-24 ENCOUNTER — Encounter (HOSPITAL_COMMUNITY): Payer: Self-pay

## 2023-02-24 DIAGNOSIS — Z7984 Long term (current) use of oral hypoglycemic drugs: Secondary | ICD-10-CM | POA: Insufficient documentation

## 2023-02-24 DIAGNOSIS — N939 Abnormal uterine and vaginal bleeding, unspecified: Secondary | ICD-10-CM

## 2023-02-24 DIAGNOSIS — Z3A08 8 weeks gestation of pregnancy: Secondary | ICD-10-CM | POA: Diagnosis not present

## 2023-02-24 DIAGNOSIS — O23591 Infection of other part of genital tract in pregnancy, first trimester: Secondary | ICD-10-CM | POA: Insufficient documentation

## 2023-02-24 DIAGNOSIS — O10911 Unspecified pre-existing hypertension complicating pregnancy, first trimester: Secondary | ICD-10-CM | POA: Diagnosis not present

## 2023-02-24 DIAGNOSIS — N760 Acute vaginitis: Secondary | ICD-10-CM

## 2023-02-24 DIAGNOSIS — O209 Hemorrhage in early pregnancy, unspecified: Secondary | ICD-10-CM | POA: Diagnosis not present

## 2023-02-24 DIAGNOSIS — Z3A09 9 weeks gestation of pregnancy: Secondary | ICD-10-CM | POA: Diagnosis not present

## 2023-02-24 DIAGNOSIS — O10919 Unspecified pre-existing hypertension complicating pregnancy, unspecified trimester: Secondary | ICD-10-CM

## 2023-02-24 LAB — CBC
HCT: 34.5 % — ABNORMAL LOW (ref 36.0–46.0)
Hemoglobin: 11 g/dL — ABNORMAL LOW (ref 12.0–15.0)
MCH: 26.3 pg (ref 26.0–34.0)
MCHC: 31.9 g/dL (ref 30.0–36.0)
MCV: 82.5 fL (ref 80.0–100.0)
Platelets: 263 10*3/uL (ref 150–400)
RBC: 4.18 MIL/uL (ref 3.87–5.11)
RDW: 14.5 % (ref 11.5–15.5)
WBC: 11.5 10*3/uL — ABNORMAL HIGH (ref 4.0–10.5)
nRBC: 0 % (ref 0.0–0.2)

## 2023-02-24 LAB — COMPREHENSIVE METABOLIC PANEL
ALT: 14 U/L (ref 0–44)
AST: 12 U/L — ABNORMAL LOW (ref 15–41)
Albumin: 2.8 g/dL — ABNORMAL LOW (ref 3.5–5.0)
Alkaline Phosphatase: 87 U/L (ref 38–126)
Anion gap: 10 (ref 5–15)
BUN: 8 mg/dL (ref 6–20)
CO2: 23 mmol/L (ref 22–32)
Calcium: 8.6 mg/dL — ABNORMAL LOW (ref 8.9–10.3)
Chloride: 100 mmol/L (ref 98–111)
Creatinine, Ser: 0.79 mg/dL (ref 0.44–1.00)
GFR, Estimated: >60 mL/min (ref >60)
Glucose, Bld: 85 mg/dL (ref 70–99)
Potassium: 3.6 mmol/L (ref 3.5–5.1)
Sodium: 133 mmol/L — ABNORMAL LOW (ref 135–145)
Total Bilirubin: 0.4 mg/dL (ref 0.3–1.2)
Total Protein: 7.1 g/dL (ref 6.5–8.1)

## 2023-02-24 LAB — URINALYSIS, ROUTINE W REFLEX MICROSCOPIC
Bilirubin Urine: NEGATIVE
Glucose, UA: NEGATIVE mg/dL
Ketones, ur: NEGATIVE mg/dL
Leukocytes,Ua: NEGATIVE
Nitrite: NEGATIVE
Protein, ur: 30 mg/dL — AB
RBC / HPF: >50 RBC/hpf (ref 0–5)
Specific Gravity, Urine: 1.026 (ref 1.005–1.030)
pH: 5 (ref 5.0–8.0)

## 2023-02-24 LAB — ABO/RH: ABO/RH(D): O POS

## 2023-02-24 LAB — WET PREP, GENITAL
Sperm: NONE SEEN
Trich, Wet Prep: NONE SEEN
WBC, Wet Prep HPF POC: <10 (ref <10)
Yeast Wet Prep HPF POC: NONE SEEN

## 2023-02-24 MED ORDER — PREPLUS 27-1 MG PO TABS: 1.0000 | ORAL_TABLET | Freq: Every day | ORAL | 13 refills | Status: DC

## 2023-02-24 MED ORDER — METRONIDAZOLE 500 MG PO TABS: 500.0000 mg | ORAL_TABLET | Freq: Two times a day (BID) | ORAL | 0 refills | Status: AC

## 2023-02-24 MED ORDER — NIFEDIPINE ER OSMOTIC RELEASE 30 MG PO TB24
60.0000 mg | ORAL_TABLET | Freq: Every day | ORAL | Status: DC
  Administered 2023-02-24: 60 mg via ORAL
  Filled 2023-02-24: qty 2

## 2023-02-24 MED ORDER — NIFEDIPINE ER 60 MG PO TB24: 60.0000 mg | ORAL_TABLET | Freq: Every day | ORAL | 11 refills | Status: DC

## 2023-02-24 NOTE — Discharge Instructions (Signed)

## 2023-02-24 NOTE — MAU Provider Note (Cosign Needed Addendum)
Vaginal Bleeding     S [name] is a 32 y.o. [phone] female at [redacted]w[redacted]d who presents to MAU today with complaint of VB. She reports 1 episode morning of 8/26.  During voiding and noticed VB and passed 1 golf ball sized clot.  Since has voided twice w/o VB noted.  Endorses mild abdominal cramping.  LMP noted to be 12/26/2022 per PCP Dr. Molli [name] note in media from 02/03/2023, positive Upreg.  She was noted to have BP elevated to 177/122 (asymptomatic, previously on Norvasc and restarted 8/5 10mg  QD) in s/o phentermine (last taken late July).    Has note established yet this pregnancy.   Pertinent items noted in HPI and remainder of comprehensive ROS otherwise negative.   O BP (!) 171/118 (BP Location: Right Arm)   Pulse 65   Temp 98.4 F (36.9 C) (Oral)   Resp 18   Ht 5\' 5"  (1.651 m)   Wt 134 kg   LMP 12/26/2022   SpO2 98%   BMI 49.17 kg/m  Physical Exam   MDM: MAU Course:  Labs: CBC = WBC 11.5, Hgb 11.0, plts 263 ABO/Rh = O positive, no rhogam needed UA = large blood >RBC, rare bacteria, neg LE / Nit Genprobe collected, pending Wet Prep clue cells present  CMP collected UPC collected   Imaging: US OB <14weeks = single live intrauterine gestation [redacted]w[redacted]d, no subchorionic hemorrhage   Intervention:  A&P: #8 weeks #BV during 1st trimester  #Vaginal Bleeding Pt with hx of surgical abortions at 9-10 weeks previously.  No threatened mention on [place] today, patient has not had any VB since the initial clot passing early this morning. Given her hx cannot r/o future miscarriage so gave usual strict return precautions.  Will also treat BV.  -metronidazole 500mg  BID Q7days   #CHTN Pt with hx of chronic HTN now affecting pregnancy.  Reports IOL 2/2 BP concerns previously, has not started antihypertensive given concerns for cost.  Asymptomatic denying HA, CP, SOB, RUQ.  Baseline labs collected and pending at time of d/c given ride concerns for patient. BP stable and pt remained  asymptomatic after Received Procardia 60mg . Has close PCP follow up on 8/28 for BP recheck.  -Procardia 60mg  every day prescribed at DC  -If BP at PCP still elevated on 8/28 then would increase to Procardia 90mg  or add Labetalol 100mg  BID  Discharge from MAU in stable condition with usual / strict precautions Follow up at PCP as scheduled for ongoing care until can establish with OBGYN for prenatal care  Allergies as of 02/24/2023   No Known Allergies      Medication List     STOP taking these medications    amLODipine 10 MG tablet Commonly known as: NORVASC   gabapentin 800 MG tablet Commonly known as: NEURONTIN   oxyCODONE-acetaminophen 10-325 MG tablet Commonly known as: PERCOCET   phentermine 15 MG capsule   phentermine 30 MG capsule       TAKE these medications    Blood Pressure Monitoring Devi 1 each by Does not apply route once a week.   cyclobenzaprine 10 MG tablet Commonly known as: FLEXERIL Take 1 tablet (10 mg total) by mouth 3 (three) times daily as needed for muscle spasms. What changed: Another medication with the same name was removed. Continue taking this medication, and follow the directions you see here.   metFORMIN 500 MG tablet Commonly known as: GLUCOPHAGE Take 1 tablet (500 mg total) by mouth daily in the  evening with largest meal. What changed: Another medication with the same name was removed. Continue taking this medication, and follow the directions you see here.   metroNIDAZOLE 500 MG tablet Commonly known as: Flagyl Take 1 tablet (500 mg total) by mouth 2 (two) times daily for 7 days.   naloxone 4 MG/0.1ML Liqd nasal spray kit Commonly known as: Narcan If found unresponsive  spray into nose and call 911 immediately   NIFEdipine 60 MG 24 hr tablet Commonly known as: ADALAT CC Take 1 tablet (60 mg total) by mouth daily. Start taking on: February 25, 2023   PrePLUS 27-1 MG Tabs Take 1 tablet by mouth daily.        Hessie Dibble, MD 02/24/2023 4:20 PM

## 2023-02-24 NOTE — MAU Note (Signed)
Shirley [name] is a 32 y.o. at [redacted]w[redacted]d here in MAU reporting: she had 1 episode a VB this morning.  Reports voided and noticed VB and passed 1 golf ball size blood clot.  States has voided twice since then and no VB noted.  Endorses mild abdominal cramping. LMP: NA Onset of complaint: today Pain score: 2 Vitals:   02/24/23 1201  BP: (!) 161/114  Pulse: 78  Resp: 19  Temp: 98.2 F (36.8 C)  SpO2: 100%     FHT:NA Lab orders placed from triage:   UA

## 2023-02-26 ENCOUNTER — Encounter: Payer: Medicaid Other | Admitting: Student

## 2023-02-26 DIAGNOSIS — Z79891 Long term (current) use of opiate analgesic: Secondary | ICD-10-CM | POA: Diagnosis not present

## 2023-02-26 DIAGNOSIS — M419 Scoliosis, unspecified: Secondary | ICD-10-CM | POA: Diagnosis not present

## 2023-02-26 DIAGNOSIS — G894 Chronic pain syndrome: Secondary | ICD-10-CM | POA: Diagnosis not present

## 2023-02-26 DIAGNOSIS — M5126 Other intervertebral disc displacement, lumbar region: Secondary | ICD-10-CM | POA: Diagnosis not present

## 2023-02-26 LAB — GC/CHLAMYDIA PROBE AMP (~~LOC~~) NOT AT ARMC
Chlamydia: NEGATIVE
Comment: NEGATIVE
Comment: NORMAL
Neisseria Gonorrhea: NEGATIVE

## 2023-02-26 NOTE — Progress Notes (Deleted)
Patient Name: Shirley [name] Date of Birth: 03-11-1991 [facility] Initial Prenatal Visit  Shirley [name] is a 32 y.o. year old [id number] at [redacted]w[redacted]d who presents for her initial prenatal visit. Pregnancy {Is/is not:9024} planned She reports {pregnancy symptoms:18128}. She {is/is not:320031::"is"} taking a prenatal vitamin.  She denies pelvic pain or vaginal bleeding.   Pregnancy Dating: The patient is dated by ***.  LMP: *** Period is certain:  {yes/no:20286}.  Periods were regular:  {yes/no:20286}.  LMP was a typical period:  {yes/no:20286}.  Using hormonal contraception in 3 months prior to conception: {yes/no:20286}  Lab Review: Blood type: O POS Rh Status: {Rh status :23298::"+"} Antibody screen: {NEGATIVE/POSITIVE FOR:19998::"Negative"} HIV: {NEGATIVE/POSITIVE FOR:19998::"Negative"} RPR: {NEGATIVE/APPRO/POSITIVE FOR:20006::"Negative"} Hemoglobin electrophoresis reviewed: {yes/no:20286::"Yes"} Results of OB urine culture are: {NEGATIVE/POSITIVE FOR:19998::"Negative"} Rubella: {Desc; immune/not/unknown:31571::"Immune"} Hep C Ab: {NEGATIVE/POSITIVE FOR:19998::"Negative"} Varicella status is {Desc; immune/not/unknown:31571::"Immune"}  PMH: Reviewed and as detailed below: HTN: {yes/no:20286::"No"}  Gestational Hypertension/preeclampsia: {yes/no:20286::"No"}  Type 1 or 2 Diabetes: {yes/no:20286::"No"}  Depression:  {yes/no:20286::"No"}  Seizure disorder:  {yes/no:20286::"No"} VTE: {yes/no:20286::"No"} ,  History of STI {yes/no:20286::"No"},  Abnormal Pap smear:  {yes/no:20286::"No"}, Genital herpes simplex:  {yes/no:20286::"No"}   PSH: Gynecologic Surgery:  {No/  **:31982:o:"no"} Surgical history reviewed, notable for: ***  Obstetric History: Obstetric history tab updated and reviewed.  Summary of prior pregnancies: *** Cesarean delivery: {yes/no:20286::"No"}  Gestational Diabetes:  {yes/no:20286::"No"} Hypertension in pregnancy:  {yes/no:20286::"No"} History of preterm birth: {yes/no:20286::"No"} History of LGA/SGA infant:  {yes/no:20286::"No"} History of shoulder dystocia: {yes/no:20286::"No"} Indications for referral were reviewed, and the patient has no obstetric indications for referral to [facility] at this time.   Social History: Partner's name: ***  Tobacco use: {yes/no:20286::"No"} Alcohol use:  {yes/no:20286::"No"} Other substance use:  {yes/no:20286::"No"}  Current Medications:  ***  Reviewed and appropriate in pregnancy.   Genetic and Infection Screen: Flow Sheet Updated {yes/no:20286::"Yes"}  Prenatal Exam: Gen: Well nourished, well developed.  No distress.  Vitals noted. HEENT: Normocephalic, atraumatic.  Neck supple without cervical lymphadenopathy, thyromegaly or thyroid nodules.  Fair dentition. CV: RRR no murmur, gallops or rubs Lungs: CTA B.  Normal respiratory effort without wheezes or rales. Abd: soft, NTND. +BS.  Uterus not appreciated above pelvis. GU: Normal external female genitalia without lesions.  Nl vaginal, well rugated without lesions. No vaginal discharge.  Bimanual exam: No adnexal mass or TTP. No CMT.  Uterus size *** Ext: No clubbing, cyanosis or edema. Psych: Normal grooming and dress.  Not depressed or anxious appearing.  Normal thought content and process without flight of ideas or looseness of associations  Fetal heart tones: {appropriate:23337::"Appropriate"}  Assessment/Plan:  Shirley [name] is a 32 y.o. [id number] at [redacted]w[redacted]d who presents to initiate prenatal care. She is doing well.  Current pregnancy issues include ***.  Routine prenatal care: As dating {ACTION; IS/IS NOT:21021397::"is not"} reliable, a dating ultrasound {HAS HAS NOT:18834::"has"} been ordered. Dating tab updated. Pre-pregnancy weight updated. Expected weight gain this pregnancy is {weight gain pregnancy :23296::"25-35 pounds "} Prenatal labs reviewed, notable for ***. Indications for  referral to [facility] were reviewed and the patient {DOES NOT does:27190::"does not"} meet criteria for referral.  Medication list reviewed and updated.  Recommended patient see a dentist for regular care.  Bleeding and pain precautions reviewed. Importance of prenatal vitamins reviewed.  Genetic screening offered. Patient opted for: {obgeneticscreen:23414}. The patient has the following indications for aspirinto begin 81 mg at 12-16 weeks: One high risk condition: chronic hypertension  MORE than one moderate risk condition: obesity and identifies as  African American  Aspirin was  recommended today based upon above risk factors (one high risk condition or more than one moderate risk factor)  The patient will not be age 69 or over at time of delivery. Referral to genetic counseling was not offered today.  The patient has the following risk factors for preexisting diabetes: BMI > 25 and high risk ethnicity (Latino, [place] American, Native American, [place] Islander, Asian Naval architect) . An early 1 hour glucose tolerance test {WAS/WAS NOT:517 432 6762::"was not"} ordered. Pregnancy Medical Home and PHQ-9 forms completed, problems noted: {yes/no:20286}  2. Pregnancy issues include the following which were addressed today:  ***   Follow up 4 weeks for next prenatal visit.

## 2023-02-27 ENCOUNTER — Telehealth: Payer: Medicaid Other

## 2023-02-27 DIAGNOSIS — O0990 Supervision of high risk pregnancy, unspecified, unspecified trimester: Secondary | ICD-10-CM | POA: Insufficient documentation

## 2023-02-27 DIAGNOSIS — Z3A09 9 weeks gestation of pregnancy: Secondary | ICD-10-CM

## 2023-02-27 DIAGNOSIS — O0991 Supervision of high risk pregnancy, unspecified, first trimester: Secondary | ICD-10-CM

## 2023-02-27 NOTE — Progress Notes (Cosign Needed Addendum)
New OB Intake  I connected with [name]  on 02/27/23 at  2:15 PM EDT by MyChart Video Visit and verified that I am speaking with the correct person using two identifiers. Nurse is located at [facility] and pt is located at home.  I discussed the limitations, risks, security and privacy concerns of performing an evaluation and management service by telephone and the availability of in person appointments. I also discussed with the patient that there may be a patient responsible charge related to this service. The patient expressed understanding and agreed to proceed.  I explained I am completing New OB Intake today. We discussed EDD of 10/02/2023, by Last Menstrual Period. Pt is [id number]. I reviewed her allergies, medications and Medical/Surgical/OB history.    Patient Active Problem List   Diagnosis Date Noted   History of termination of pregnancy 09/17/2022   Pregnancy of unknown anatomic location 05/16/2021   Assistance needed with transportation 11/22/2019   Low HDL (under 40) 10/05/2019   Vitamin D deficiency 10/05/2019   Elevated alkaline phosphatase level 10/05/2019   Excessive daytime sleepiness 10/05/2019   Anxiety and depression 10/05/2019   Neck pain 07/16/2019   Trapezius muscle spasm 02/25/2019   Acute pain of both shoulders 02/25/2019   Finger pain, right 02/13/2019   Disorder of hip joint 02/16/2018   Chronic low back pain 01/28/2018   Osteoarthritis of both knees 09/29/2015   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 Discussed the nature of our practice with multiple providers including residents and students. Due to  the size of the practice, the delivering provider may not be the same as those providing prenatal care.   Offered upcoming OB visit with CNM to discuss further.  MyChart/Babyscripts MyChart access verified. I explained pt will have some visits in office and some virtually. Babyscripts instructions given and order placed. Patient verifies receipt of registration text/e-mail. Account successfully created and app downloaded.  Blood Pressure Cuff/Weight Scale Patient has blood pressure cuff. Explained after first prenatal appt pt will check weekly and document in Babyscripts.  Anatomy US Explained first scheduled [place] will be around 19 weeks. Anatomy US scheduled for 05/08/2023 at 8:15am.  Is patient a CenteringPregnancy candidate?  Not a Candidate Not a candidate due to [facility], medication controlled If accepted,    Is patient a Mom+Baby Combined Care candidate?  Not a candidate   If accepted, confirm patient does not intend to move from the area for at least 12 months, then notify Mom+Baby staff  Interested in [place]? If yes, send referral and doula dot phrase.   Is patient a candidate for Babyscripts Optimization? No - HTN, DM  First visit review I reviewed new OB appt with patient. Explained pt will be seen by Dr. Para March at first visit. Discussed Avelina Laine genetic screening with patient.  Panorama and Horizon.. Routine prenatal labs is needed at new ob appointment.  Last Pap Diagnosis  Date Value Ref Range Status  05/07/2017   Final   NEGATIVE FOR INTRAEPITHELIAL LESIONS OR MALIGNANCY.    Lowry Bowl, CMA 02/27/2023  2:17 PM

## 2023-02-27 NOTE — Patient Instructions (Signed)
Safe Medications in Pregnancy   Acne:  Benzoyl Peroxide  Salicylic Acid   Backache/Headache:  Tylenol: 2 regular strength every 4 hours OR               2 Extra strength every 6 hours   Colds/Coughs/Allergies:  Benadryl (alcohol free) 25 mg every 6 hours as needed  Breath right strips  Claritin  Cepacol throat lozenges  Chloraseptic throat spray  Cold-Eeze- up to three times per day  Cough drops, alcohol free  Flonase (by prescription only)  Guaifenesin  Mucinex  Robitussin DM (plain only, alcohol free)  Saline nasal spray/drops  Sudafed (pseudoephedrine) & Actifed * use only after [redacted] weeks gestation and if you do not have high blood pressure  Tylenol  Vicks Vaporub  Zinc lozenges  Zyrtec   Constipation:  Colace  Ducolax suppositories  Fleet enema  Glycerin suppositories  Metamucil  Milk of magnesia  Miralax  Senokot  Smooth move tea   Diarrhea:  Kaopectate  Imodium A-D   *NO pepto Bismol   Hemorrhoids:  Anusol  Anusol HC  Preparation H  Tucks   Indigestion:  Tums  Maalox  Mylanta  Zantac  Pepcid   Insomnia:  Benadryl (alcohol free) 25mg every 6 hours as needed  Tylenol PM  Unisom, no Gelcaps   Leg Cramps:  Tums  MagGel   Nausea/Vomiting:  Bonine  Dramamine  Emetrol  Ginger extract  Sea bands  Meclizine  Nausea medication to take during pregnancy:  Unisom (doxylamine succinate 25 mg tablets) Take one tablet daily at bedtime. If symptoms are not adequately controlled, the dose can be increased to a maximum recommended dose of two tablets daily (1/2 tablet in the morning, 1/2 tablet mid-afternoon and one at bedtime).  Vitamin B6 100mg tablets. Take one tablet twice a day (up to 200 mg per day).   Skin Rashes:  Aveeno products  Benadryl cream or 25mg every 6 hours as needed  Calamine Lotion  1% cortisone cream   Yeast infection:  Gyne-lotrimin 7  Monistat 7    **If taking multiple medications, please check labels to avoid  duplicating the same active ingredients  **take medication as directed on the label  ** Do not exceed 4000 mg of tylenol in 24 hours  **Do not take medications that contain aspirin or ibuprofen            [place] Pediatric Providers  [place] ([address]) [facility] Brown, MD; Chambliss, MD; Eniola, MD; Hensel, MD; McDiarmid, MD; McIntyer, MD [address] [place], [place] [address] [phone] Mon-Fri 8:30-12:30, 1:30-5:00  Providers come to see babies during newborn hospitalization Only accepting infants of Mother's who are seen at Family Medicine Center or have siblings seen at   [facility] Center Medicaid - Yes; Tricare - Yes   [facility] Mulberry, MD [address] [place], [place] [address] [phone] Mon, Tue, Thur, Fri 8:30-5:00, Wed 10:00-7:00 (closed 1-2pm daily for lunch) Takes [place] residents with no insurance.  [place] [facility] only with Medicaid/insurance; Tricare - no  [facility] Ben-Davies, MD; Brown, MD; Chandler, MD; Ettefagh, MD; Grant, MD; Hanvey, MD; Herrin, MD; Jones,  MD; Lester, MD; McCormick, MD; McQueen, MD; Simha, MD; Stanley, MD; Stryffeler, NP [address], [place],  [address] [phone] Mon, Tue, Thur, Fri 8:30-5:30, Wed 9:30-5:30, Sat 8:30-12:30 Only accepting infants of first-time parents or siblings of current   patients Hospital discharge coordinator will make follow-up appointment Medicaid - yes; Tricare - yes  [place] ([address])  [facility] Cox, MD; Davis, MD; Dovico, MD; Ettefaugh, MD; Lowe, MD; Nation, MD; Slimp, MD; Sumner, MD; Williams, MD [address], [place], [place] [address] [phone] Mon-Fri 8:30-5:00, closed for lunch 12:30-1:30; Sat-Sun 10:00-1:00 Accepting Newborns with commercial insurance only, must call prior to  delivery to be accepted into  practice.  Medicaid - no, Tricare - yes   [facility] [address] [place], [place] [address] [phone] or [phone] Mon to Fri 8am to 10pm, Sat 8am to 1pm (virtual only on weekends) Only accepts Medicaid Healthy Blue pts  [facility]  Artis, MD; Coccaro, MD; Lock[place] Gardner, MD; Netherton, NP; Roper, MD; Wilmot, PA-C; Skinner, MD [address] [place], [place] [address] [phone] Mon-Fri 8:30-5:30 Medicaid - yes, Tricare - yes  [place] ([address]) [facility] Warner, MD [address], [place], [place] [address] [phone] Mon, Tues, Wed Fri 8:30-5:00, Sat 8:30-12:00, Closed Thursdays Accepting siblings of established patients and first time mom's if you call prenatally Medicaid- yes; Tricare - yes  [facility] Becker, PA; Hagler, MD; Quinn, PA-C; Scifres, PA; Sun, MD; Swayne, MD;  [address], [place], [place] [address] [phone] Mon-Fri 8:30-5:00, closed for lunch 1-2 Only accepting newborns of established patients Medicaid- no; Tricare - yes  [place] ([address]) [facility] Timberlake, MD; [address], [place], [place] [address] [phone] Mon-Fri 8:00-5:00 Medicaid - No; Tricare - Yes  [facility] [employer]  Brake, NP; Wharton, PA [address], [place], [place] [address] [phone] Mon-Fri 8:00-5:00 Medicaid - No, Tricare - Yes  [facility] Gay, MD; Quinlan, MD; Blatt, DNP [address] [place], [place] [address] [phone]  Mon-Fri 8:00-5:00 Medicaid - No; Tricare - Yes  [facility] [address] [place], [place] [address] [phone] Mon-Fri 8:30-5:00 (lunch 12:00-1:00) Medicaid -Yes; Tricare - Yes  [facility] Jordan, MD [address],  [place], [place] [address] [phone] Mon-Fri 8:00-5:00 Seeing newborns of current patients only. No new patients Medicaid - No, Tricare - yes  [facility] Parker, MD [address] [place], [place] [address] [phone] Mon-Fri 8:00-5:00 Medicaid -yes as secondary coverage only; Tricare - yes  [facility] Brecken, PA; Christy, NP; Dees, MD; DeClaire, MD; DeWeese, MD; Hodge, PA; Smoot, NP; Summer, MD; Vapne, MD [address] [place], [place] [address] [phone] Mon-Fri 8:30-5:00, Sat 9:00-11:00 Accepts commercial insurance ONLY. Offers free prenatal information sessions for families. Medicaid - No, Tricare - Call first  [facility] Bouska, MD; Gordon, PA; Jeffery, PA; Weber, PA [address] [place] [address] [phone] Mon-Fri 7:30-5:30 Medicaid - Yes; Tricare - yes  [place] ([address] & [address])  [facility] Reese, MD [address] [place], [place] [address] [phone] Mon-Thur 8:00-6:00, closed for lunch 12-2, closed Fridays Medicaid - yes; Tricare - no  [facility] Anderson, NP; Badger, MD; Beal, PA; Spencer, PA [address], [place], [place] [address] [phone] Mon-Fri 7:30-4:30 Medicaid - yes, Tricare - yes  [facility]  Agbuya, MD; Klett, NP; Romgoolam, MD; Rothstein, NP [address], , [place] [address] [phone] Mon-Fri 8:30-5:00, closed for lunch 1-2, Sat 8:30-12:00 - sick visits only Providers come to see   babies at [facility] Only accepting newborns of siblings and first time parents ONLY if who have met with office prior to delivery Medicaid -Yes; Tricare - yes  [facility] [place]  Golden, DO; Friddle, NP; Wallace, MD; Wood, MD:  [address], [place], [place] [address] [phone] Mon- Fri 8:00-5:00, Sat 9:00-12:00 - sick  visits only Accepting siblings of established patients and first time mom/baby Medicaid - Yes; Tricare - yes Patients must have vaccinations (baby vaccines)  [place] ([address] & [address])  [facility] [facility] [address] [place], [place] [address] [phone] Mon-Fri 8:00-5:00 Medicaid - no; Tricare - yes  [facility] Briscoe, MD; Schmidt, PA; Moreira, PA [address], [place], [place] [address] [phone] Mon-Fri 8:00-5:00 Medicaid- yes; Tricare - yes  [facility] Boyd, MD; Jones, NP; Osborn, PA [address], [place], [place] [address] [phone] Mon-Fri 8:00-5:00 Medicaid - Yes; Tricare - yes  [place] ([address])  [facility] Atkinson, PA; Calderon, PA; Cummings, MD; Dillard, MD; Henrish, NP; Isenhour, DO; Martin, PA; Olson, MD; Ott, MD; Phillips, MD; Valente, PA; VanDeven, PA; Yonjof, NP [address], [place], [place] [address] [phone] Mon-Fri 8:30-5:00, Sat 9:00-12:00 - sick only Please register online [email or web address] then schedule online or call office Medicaid-Yes; Tricare -yes  [facility]  Dabrusco, MD; Dial, MD; Koochiching, MD; Fleenor, NP; Goolsby, PA; Tonuzi, MD; Turner, NP; West, MD [address], [place], [place] [address] [phone] Mon-Fri 8:00-5:30, Sat&Sun by appointment (phones open at 8:30) Medicaid - Yes; Tricare - yes  [place] ([address]) [facility] Allen, CPNP; Bates, MD; Gordon, MD; Mills, NP; Weinshilboum, DO [address], [place], [place] [address] [phone] M-F 8:00 - 5:15, Sat/Sun 9-12 sick visits only Medicaid - No; Tricare - yes  [facility] [place] [facility]  Brown, PA-C; Cowen, PA-C; Dennis, DO; Fuster, PA-C; Martin, PA-C; Shelton,  PA-C; Spry, MD [address] [place], [place] [address] [phone] Mon-Thur 8:00-7:00, Fri 8:00-5:00 Accepting Medicaid for 13 and under only   [facility] [place] [facility] [place] Hayes, FNP; List, FNP; Moran, MD; Pitonzo, PA-C; Scholer, MD; Spangle, FNP; Nzenwa, FNP; Jasper, MD; Moran, MD [address] [place], [place] [address] [phone] Mon-Fri 8:30-5:30 Medicaid - Yes; Tricare - yes  [facility] [address]  Kelly, CPNP; Logan, MD; Poth, MD; Ramadoss, MD; Staton, NP [address], [place], [place] [address] [phone] Mon-Thur 8:00-5:30, Fri 8:00-5:00, Sat 9:00-12:00 Medicaid - yes, Tricare - yes  [place] ([address])  [facility] [place] Masneri, DO; Meyers, MD; Nelson, PA [address], [place], [place] [address] [phone] Mon-Fri 8:00-5:00, closed for lunch 12-1 Medicaid - No; Tricare - yes  [facility] [place] McGowen, MD [address], [place], [place] [address] [phone] Mon-Fri 8:00-5:00 Medicaid - No; Tricare - yes  [facility] [place] MacDonald, MD; Nayak, MD; Kearns, MD; Jones, MD [address] [place] [address], [place], [place] [address] [phone] Mon-Fri 8:00-5:00 Medicaid- Yes; Tricare - yes  [place] ([address])  [facility] [place] Martin, PA-C; Tabori, MD [address], [place],  [address] [phone] Mon-Fri 8:00-5:00 Medicaid - No; Tricare - yes  [facility] [place]  Margin - CPNP [address], [place], [place] [address] [phone] Mon-Weds 8:00-6:00, Thurs-Fri 8:00-5:00, Sat 9:00-12:00 Medicaid - yes; Tricare - yes   [facility] [place] Aubuchon, MD; Brandon, PA [address] [place], [place] [address] [phone] Mon-Fri 8:00-5:00 Medicaid - yes; Tricare - yes  [place]  Pediatric Providers  [facility] [address], [place], [place] [address] [phone] M, Thur: 8am -8pm, Tues, Weds: 8am - 5pm; Fri: 8-1 Medicaid - Yes; Tricare - yes  [facility] Mertz, MD; Johnson, MD; Wells, MD; Downs, PA; Hockenberger, PA [address], [place], [place] [address] [phone] M-F 8:30 - 5:00 Medicaid - Call office; Tricare -yes  [facility] Bonney, MD; Page, MD, Minter, MD; Mueller, PNP; Thomason, NP [address], [place], [place] [address] [phone] M-F 8:30 - 5:00, Sat/Sun 8:30 - 12:30 (sick visits) Medicaid - Call office; Tricare -yes  [facility] Lewis, MD; Shaub, PNP; Boylston, MD; Quaile, PA; Nonato, NP; Landon, CPNP [address], [place], [place] [address] [phone] M-F 8:30 - 5:00 Medicaid - Call office; Tricare - yes  [facility] [place] Cline, MD; Dvergsten, MD; Flores, MD; Kawatu, MD; Nogo, MD [address], [place], [place] [address] [phone] M-Thur: 8:00 - 5:00; Fri: 8:00 - 4:00 Medicaid - yes; Tricare - yes  [facility] [address] [place], [place], [place] [address] [phone] M-F: 8:30- 5:00, closed for lunch 12:30 - 1:00 Medicaid - yes; Tricare -yes  [facility] [place] [address], [place], [place] [address] [phone] M-F 8:00 - 5:00 Medicaid - yes; Tricare - yes  [facility] Johnson, DO; Rumball, DO; Wicker, NP [address] [place], [place], [place] [address] [phone] M-F 8:00 - 5:00, Closed 12-1 for lunch Medicaid - Call; Tricare - yes  [facility] Stein, MD [address], [place], [place] [address] [phone] M-F: 8:00-5:00, Sat: 8:00 - noon Medicaid - call; Tricare -yes  [place] Pediatric Providers  [facility] Collins, FNP-C [address], [place], [place] [address] [phone] M-W: 8:00-5:00, Thur: 8:00 -  7:00, Fri: 8:00 - noon Medicaid - yes; Tricare - yes  [facility] [place] Adams, FNP [address], [place], [place] [address] [phone] M-F 8:00 - 5:00, Closed for lunch 12-1 Medicaid - yes; Tricare - yes  [place] Pediatric Providers  [facility] Smith, FNP, Melvin, MD, Fay, FNP-C [address] [place] [address] [place], [place], [place] [address] [phone] M-T 8:00-5:00, Wed-Fri 7:00-6:00 Medicaid - Yes; Tricare -yes  [facility] [place] Civiletti, DO; [address] [place] [address], [place], [place] [address] [phone] M-F 8:00 - 5:00, closed for lunch 12-1 Medicaid - Yes; Tricare - yes  [facility]  Barnes, MD; Bergdolt, MD; Caulfield, MD; Emrich, MD; Fiscus, MD; Hoppens, MD; Kylstra, MD, McPherson, MD; Todd, MD; Prestwood, MD; Waters, MD; Wood, MD [address], [place], [place] [address] [phone] M-F 8:00-5:00 Medicaid - yes; Tricare - yes  [facility] Cheema, MD (speaks Punjabi and Hindi) [address] [place],  [address] [phone] M-F: 8:30 - 5:00, closed 12:30 - 1 for lunch Medicaid - Yes; Tricare -yes  [place] Pediatric Providers  [facility] Loda, MD; Timberlake, MD; Burke,   MD [address], [place] [address] [phone] M-Th: 8:00 - 5:30, Fri: 8:00 - 12:00 Medicaid - yes; Tricare - yes  [facility] [place] Lookabill, NP; Meier, MD; Daffron, MD [address], [place], [place] [address] [phone] M-F: 8:00 - 5:00 Medicaid - yes; Tricare - yes  [facility] Busse, NP; Bowman, NP; Baune, NP; Entwistle, MD; Williams, MD, Huffman, NP, Ferguson, MD; Patel, DO [address], [place], [place] [address] [phone] M-F: 8:30 - 5:30p Medicaid - yes; Tricare - yes Other locations available as well  [place] [facility] Rajan, MD; Wilson, MD; Morgan, PA-C, Domenech, PA-C; Myers, PA-C [address], [place], [place] [address] [phone] M-W: 8:00am - 7:00pm, Thurs: 8:00am - 8:00pm; Fri: 8:00am - 5:00pm, closed daily from 12-1 for lunch Medicaid - yes; Tricare - yes  [place] [facility] Providers  [facility] [place] Adams, MD; Crystal, FNP; Hadley, MD; Stokes, MD; Johnson, PNP; Brady, PA-C; West, PNP; Gardner, MD;  [address], [place], [place] [address] [phone] M - Fri: 8am - 5pm, Sat 9-noon Medicaid - Yes; Tricare -yes  [facility] [place] Nayak, MD; Jones, FNP; McDonald, MD; Kearns, MD [address] [place] [address], [place], [place] [phone] M-F 8:00 - 5:00 Medicaid - call; Tricare - yes  [facility] [place] Bell, MD; Emory, PNP; Pinder, MD; Anderson, MD; Light, PA-C; Johnson, MD; Latta, MD; Saul, PNP; Rainey, MD; Clifford, MD; McClung, MD [address], [place], [place] [address] [phone] M-F 8:00am - 5:00pm; Sat. 9:00 - 11:00 Medicaid - yes; Tricare - yes  [facility] [place] Soldato-Couture, MD [address], [place], [place] [address] [phone] M-F 8:00 - 5:00 Medicaid - [place] Medicaid only; Tricare - yes  [facility] [place] Walker, MD; Davis, PNP; Ajizian, MD [address] [place] [address], [place], [place] [address] [phone] M-F 8:00 - 5:00 Medicaid - yes; Tricare - yes  [facility] [place] Barry, MD; Brown, MD, Forest, MD, Hazek, MD; Hoyle, MD; Smith, MD; [address], [place], [place] [address] [phone] M-F: 8-5 Medicaid - yes; Tricare - yes  [facility] [place] Brady, Md; Dowlen, MD; [address] [place] [address], [place], [place] [address] [phone] M-F 8-5 Medicaid - yes; Tricare - yes  [facility] Kearns, MD; Nayak, MD; Soldato-Courture, MD; Pellam-Palmer, DNP;  Herring, PNP [address], , [place] [address] [phone] M-F 8-5 Medicaid - yes; Tricare - yes  [facility] Weathers, MD; Merritt, PA-C; Davis-PA-C; Warn[place]ont, MD [address], [place], [place] [address] [phone] M-F 7am - 5 pm Medicaid - call; Tricare - yes  [facility] Hill, PNP; Erickson, MD; Robinson, MD [address], [place], [place] [address] [phone] M-F 8-5 Medicaid - yes; Tricare - yes  [facility] Kribbs, MD; Warner, MD; Williams, FNP; [place], FNP; Boles, FNP; Romblad, PA-C; [place] - FNP [address], [place], [place] [address] [phone] M-F 8-5 Medicaid- yes; Tricare - yes  [facility] [place] [place] Yoder, MD; Verenes, MD; Armentrout, MD; Stewart, MD; Beasley, CPNP; Ford, MD; Erickson, MD; Rice, MD [address], [place], [place] [address] [phone] M-F: 8-5, Sat: 9-4, Sun 9-12 Medicaid - yes; Tricare - yes  [facility] Little, PNP; Davis, PNP [address], [place],   [place] [address] [phone] M-F 8 - 5, closed 12-1 for lunch Medicaid - yes; Tricare - yes  [facility] Friesen, MD; Cnegia, MD; Rice, MD; Patel, DO [address], [place], [place] [address] [phone] M-F 8- 5:30 Medicaid - yes; Tricare - yes  [facility] [place] Zvolensky, MD; Ray, MD; Haas, MD [address] [address], [place], [place] [address] [phone] M: 8-7; Tues-Fri: 8-5; Sat: 9-12 Medicaid - yes; Tricare - yes  [facility] [place] Heinrich, MD; Meyer, MD; Clark, MD; Rhyne, MD; Aubuchon, MD [address], [place], [place] [address] [phone] M: 8-7; Tues-Fri: 8-5; Sat: 8:30-12:30 Medicaid - yes;  Tricare - yes  [facility] [place] Bista, MD; Dillard, PA [address], [place], [place] [address] [phone] Mon-Fri: 8-5 Medicaid - yes; Tricare - yes  [facility] [place] Beasley, CPNP; Mahle, CPNP; Rice, MD; Duffy, MD; Culler, MD; [address], [place], [place] [address] [phone] M-F: 8-5, closed 1-2 for lunch Medicaid - yes; Tricare - yes  [facility] Rickman, PA; Mounce, NP; Smith, MD; Jordan, CPNP; Darty, PA; Ball, MD; Wallace, MD [address] [place] [address], [place], [place] [address] [phone] M-Thurs: 8-7; Fri: 8-6; Sat: 9-12; Sun 2-4 Medicaid - yes; Tricare - yes  [facility] Brown, MD; Shin, MD; Goodman, DNP, FNP; Sebesta, DO; [address], [place], [place] [address] [phone] M-F: 8-5 Medicaid - yes; Tricare - yes  [place] Pediatric Providers  [facility] Dough, MD; Welsh, NP [address], [place], [place] [address] [phone] M - Fri: 8am - 5pm, closed for lunch 12-1 Medicaid - Yes; Tricare - yes  [facility] Manandhar, MD; Riley, MD; Sanger, DO; Vinocur, MD;Hall, PA; Walsh, PA; Campbell, NP [address], [place] [address] [phone] M-F 8:00 - 5:00, Sat 8:00 - 11:30 Medicaid - yes; Tricare - yes  [facility] Khan, MD; Redding, MD, Street, MD, Holt, MD, Burgart, MD; Rhyne, NP; Dickinson, PA;  [address] [place] [address], [place], [place] [address] [phone] M-F 8:10am - 5:00pm Medicaid - yes; Tricare - yes  [facility] Connors, MD; Kime, NP [address], [place], [place] [address] [phone] M-F 8:00 - 5:00 Medicaid - [place] Medicaid only; Tricare - yes  [facility] [place] Whyte, MD; Fox, NP [address], [place], [place] [address] [phone] M-F 8:00 - 5:00; Closed for lunch 12 - 1:00 Medicaid - yes; Tricare - yes  [facility] Penner, MD; Wilburn, FNP [address], [place], [place] [address] [phone] Mon 9-5; Tues/Wed 10-5; Thurs 8:30-5; Fri: 8-12:30 Medicaid - yes; Tricare - yes  [place] Pediatric Providers  [facility]  Golding, MD; Jackson, PA-C [address], [place], [place] [address] [phone] phone [phone] fax M-F 7:15 - 4:30 Medicaid - yes; Tricare - yes  [facility] Gosrani, MD; Meccariello, DO [address] [place], [place] [address] [phone] M-Fri: 8:30 - 5:00, closed for lunch everyday noon - 1pm Medicaid - Yes; Tricare - yes  [facility] Burdine, MD; Daniel, MD; Howard, MD; Sasser, MD; Boles, PA; Boyd, PA-C; Carroll, PA; McGee, PA; Skillman,   PA; Wilson, PA [address] [place], [place] [address] [phone] M-Thurs: 7:30am - 7:00pm; Friday 7:30am - 4pm; Sat: 8:00 - 1:00 Medicaid - Yes; Tricare - yes  [facility] [place] Akhbari, MD; Law, MD; Qayumi, MD; Salvador, DO [address], [place], [place] [address] [phone] M-Thur: 8:00 - 5:00, Fri: 8:00 - Noon Medicaid - yes; Tricare - yes No [place] [employer]  [facility] Dettinger, MD; Gottschalk, DO; Hawks, NP; Martin, NP; Morgan, NP; Milian, NP; Rakes, NP; Stacks, MD; Webster, PA [address], [place], [place] [address] [phone] M-F 8:00 - 5:00 Medicaid - yes; Tricare - yes  [facility] Collins, FNP-C; Bucio, FNP-C [address], [place], [place] [address] [phone] M, W, R 8:00-5:00, Tues: 8:00am - 7:00pm; Fri 8:00 - noon Medicaid - Yes; Tricare - yes  [facility] Khan, MD [address] [place]  [address] [place], [place] [address] [phone]  M-Thurs 8:30-5:30, Fri: 8:30-12:30pm Medicaid - Yes; Tricare - N  

## 2023-03-01 ENCOUNTER — Inpatient Hospital Stay (HOSPITAL_COMMUNITY): Payer: Medicaid Other

## 2023-03-01 ENCOUNTER — Inpatient Hospital Stay (HOSPITAL_COMMUNITY)
Admission: AD | Admit: 2023-03-01 | Discharge: 2023-03-01 | Disposition: A | Discharge status: Home / Self Care | Payer: Medicaid Other | Attending: Obstetrics and Gynecology | Admitting: Obstetrics and Gynecology

## 2023-03-01 ENCOUNTER — Encounter (HOSPITAL_COMMUNITY): Payer: Self-pay | Admitting: Obstetrics and Gynecology

## 2023-03-01 DIAGNOSIS — Z3A09 9 weeks gestation of pregnancy: Secondary | ICD-10-CM | POA: Diagnosis not present

## 2023-03-01 DIAGNOSIS — O23591 Infection of other part of genital tract in pregnancy, first trimester: Secondary | ICD-10-CM | POA: Insufficient documentation

## 2023-03-01 DIAGNOSIS — R109 Unspecified abdominal pain: Secondary | ICD-10-CM | POA: Diagnosis not present

## 2023-03-01 DIAGNOSIS — O10911 Unspecified pre-existing hypertension complicating pregnancy, first trimester: Secondary | ICD-10-CM | POA: Insufficient documentation

## 2023-03-01 DIAGNOSIS — O209 Hemorrhage in early pregnancy, unspecified: Secondary | ICD-10-CM | POA: Diagnosis not present

## 2023-03-01 DIAGNOSIS — R0602 Shortness of breath: Secondary | ICD-10-CM | POA: Diagnosis not present

## 2023-03-01 DIAGNOSIS — O26891 Other specified pregnancy related conditions, first trimester: Secondary | ICD-10-CM | POA: Diagnosis not present

## 2023-03-01 DIAGNOSIS — B9689 Other specified bacterial agents as the cause of diseases classified elsewhere: Secondary | ICD-10-CM | POA: Diagnosis not present

## 2023-03-01 DIAGNOSIS — O10919 Unspecified pre-existing hypertension complicating pregnancy, unspecified trimester: Secondary | ICD-10-CM

## 2023-03-01 DIAGNOSIS — R519 Headache, unspecified: Secondary | ICD-10-CM | POA: Diagnosis not present

## 2023-03-01 LAB — URINALYSIS, ROUTINE W REFLEX MICROSCOPIC
Bilirubin Urine: NEGATIVE
Glucose, UA: NEGATIVE mg/dL
Ketones, ur: 5 mg/dL — AB
Nitrite: NEGATIVE
Protein, ur: NEGATIVE mg/dL
Specific Gravity, Urine: 1.024 (ref 1.005–1.030)
pH: 5 (ref 5.0–8.0)

## 2023-03-01 MED ORDER — NIFEDIPINE 10 MG PO CAPS
20.0000 mg | ORAL_CAPSULE | Freq: Once | ORAL | Status: AC
  Administered 2023-03-01: 20 mg via ORAL
  Filled 2023-03-01: qty 2

## 2023-03-01 MED ORDER — ACETAMINOPHEN 500 MG PO TABS
1000.0000 mg | ORAL_TABLET | Freq: Once | ORAL | Status: AC
  Administered 2023-03-01: 1000 mg via ORAL
  Filled 2023-03-01: qty 2

## 2023-03-01 MED ORDER — METRONIDAZOLE 0.75 % VA GEL
1.0000 | Freq: Every day | VAGINAL | Status: DC
  Administered 2023-03-01: 1 via VAGINAL
  Filled 2023-03-01: qty 70

## 2023-03-01 MED ORDER — NIFEDIPINE ER OSMOTIC RELEASE 30 MG PO TB24
30.0000 mg | ORAL_TABLET | Freq: Once | ORAL | Status: AC
  Administered 2023-03-01: 30 mg via ORAL
  Filled 2023-03-01: qty 1

## 2023-03-01 NOTE — MAU Note (Addendum)
.  Shirley [name] is a 32 y.o. at [redacted]w[redacted]d here in MAU reporting: Light pink/light red vaginal spotting that began earlier today around 1500. She reports last night and this morning she experienced a couple of episodes of lower abdominal cramping. Reports feeling short of breath for the past two days as well as a HA. Denies chest pain. Denies recent IC. Denies vaginal itching and vaginal odors. Denies urinary sx's.   Has not been able to pick up her prescriptions. Was diagnosed with BV in MAU on 8/26 and prescribed Flagyl but has not been able to pick it up. Was also rx Nifedepine for [facility]. Patient had several severe range BP's in MAU on 8/26.  Onset of complaint: 1500 Pain score: 7/10 HA  Lab orders placed from triage: UA

## 2023-03-01 NOTE — MAU Provider Note (Incomplete)
Chief Complaint: Abdominal Pain, Vaginal Bleeding, Headache, and Shortness of Breath   Event Date/Time   First Provider Initiated Contact with Patient 03/01/23 1703      SUBJECTIVE HPI: Shirley [name] is a 32 y.o. [id number] at [redacted]w[redacted]d by {Ob dating:14516} who presents to maternity admissions reporting ***. She denies vaginal bleeding, vaginal itching/burning, urinary symptoms, h/a, dizziness, n/v, or fever/chills.     Location: *** Quality: *** Severity: ***/10 on pain scale Duration: *** Timing: *** Modifying factors: *** Associated signs and symptoms: ***  HPI  Past Medical History:  Diagnosis Date  . Acute pain of both shoulders 02/25/2019  . Anxiety   . Anxiety and depression 10/05/2019  . Arthritis   . Assistance needed with transportation 11/22/2019  . Back pain   . [place] 08/28/2006   Qualifier: Diagnosis of   By: Para March MD, Cheree Ditto        . Chest pain   . Chronic low back pain 01/28/2018  . Constipation   . Depression   . Disorder of hip joint 02/16/2018  . Edema, lower extremity   . Elevated alkaline phosphatase level 10/05/2019  . Finger pain, right 02/13/2019  . GERD (gastroesophageal reflux disease)   . Hip dysplasia   . History of termination of pregnancy 09/17/2022  . Hypertension   . Joint pain   . Low back pain with right-sided sciatica 09/15/2015  . Low HDL (under 40) 10/05/2019  . Neck pain 07/16/2019  . Obesity   . Osteoarthritis   . Pregnancy of unknown anatomic location 05/16/2021   HCG 1020 05/15/21  Repeat in 48 hours    . Rheumatoid arthritis (HCC)   . Sciatica   . Shortness of breath   . Snapping hip syndrome   . 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   MRI (06/11/2016)  T11-T12: degenerative disc bulge and facet hypertrophy w/o significant stenosis  L4-L5: mild b/l facet arthrosis  L5-S1: mild b/l facet arthrosis, sm central disc protrusion w/o significant stenosis or frank neural impingement and mod b/l  foraminal narrowing at L5-S1 related to disc bulge and facet disease, right worse than left    . Trapezius muscle spasm 02/25/2019  . Vitamin D deficiency 10/05/2019   Past Surgical History:  Procedure Laterality Date  . TOOTH EXTRACTION N/A 10/22/2013   Procedure: DENTAL EXTRACTIONS TEETH #1, 16, 17, 32;  Surgeon: Georgia Lopes, DDS;  Location: [facility] OR;  Service: Oral Surgery;  Laterality: N/A;   Social History   Socioeconomic History  . Marital status: Single    Spouse name: Not on file  . Number of children: 3  . Years of education: Not on file  . Highest education level: Not on file  Occupational History  . Occupation: stay at home mom  Tobacco Use  . Smoking status: Former    Current packs/day: 0.00    Types: Cigarettes    Quit date: 01/29/2017    Years since quitting: 6.0  . Smokeless tobacco: Never  Substance and Sexual Activity  . Alcohol use: No    Alcohol/week: 0.0 standard drinks of alcohol    Comment: rare  . Drug use: No    Types: Marijuana    Comment: daily usage  . Sexual activity: Yes    Birth control/protection: None  Other Topics Concern  . Not on file  Social History Narrative  . Not on file   Social Determinants of Health   Financial Resource Strain: Not on file  Food Insecurity: Not  on file  Transportation Needs: Not on file  Physical Activity: Not on file  Stress: Not on file  Social Connections: Not on file  Intimate Partner Violence: Not on file   No current facility-administered medications on file prior to encounter.   Current Outpatient Medications on File Prior to Encounter  Medication Sig Dispense Refill  . Blood Pressure Monitoring DEVI 1 each by Does not apply route once a week. 1 each 0  . cyclobenzaprine (FLEXERIL) 10 MG tablet Take 1 tablet (10 mg total) by mouth 3 (three) times daily as needed for muscle spasms. 90 tablet 0  . metFORMIN (GLUCOPHAGE) 500 MG tablet Take 1 tablet (500 mg total) by mouth daily in the evening with  largest meal. 30 tablet 0  . metroNIDAZOLE (FLAGYL) 500 MG tablet Take 1 tablet (500 mg total) by mouth 2 (two) times daily for 7 days. 14 tablet 0  . naloxone (NARCAN) nasal spray 4 mg/0.1 mL If found unresponsive  spray into nose and call 911 immediately 1 each 12  . NIFEdipine (ADALAT CC) 60 MG 24 hr tablet Take 1 tablet (60 mg total) by mouth daily. 30 tablet 11  . Prenatal Vit-Fe Fumarate-FA (PREPLUS) 27-1 MG TABS Take 1 tablet by mouth daily. 30 tablet 13   No Known Allergies  ROS:  Review of Systems   I have reviewed patient's Past Medical Hx, Surgical Hx, Family Hx, Social Hx, medications and allergies.   Physical Exam  Patient Vitals for the past 24 hrs:  BP Temp Temp src Pulse Resp SpO2 Height Weight  03/01/23 1650 -- -- -- -- -- 100 % -- --  03/01/23 1646 (!) 174/123 -- -- 83 -- -- -- --  03/01/23 1633 (!) 170/117 98.4 F (36.9 C) Oral 89 20 99 % 5\' 5"  (1.651 m) 132.6 kg   Constitutional: Well-developed, well-nourished female in no acute distress.  Cardiovascular: normal rate Respiratory: normal effort GI: Abd soft, non-tender. Pos BS x 4 MS: Extremities nontender, no edema, normal ROM Neurologic: Alert and oriented x 4.  GU: Neg CVAT.  PELVIC EXAM: Cervix pink, visually closed, without lesion, scant white creamy discharge, vaginal walls and external genitalia normal Bimanual exam: Cervix 0/long/high, firm, anterior, neg CMT, uterus nontender, nonenlarged, adnexa without tenderness, enlargement, or mass  FHT *** by doppler  LAB RESULTS No results found for this or any previous visit (from the past 24 hour(s)).  --/--/O POS (08/26 1153)  IMAGING US OB Comp Less 14 Wks  Result Date: 02/24/2023 CLINICAL DATA:  Vaginal bleeding. EXAM: OBSTETRIC <14 WK ULTRASOUND TECHNIQUE: Transabdominal ultrasound was performed for evaluation of the gestation as well as the maternal uterus and adnexal regions. COMPARISON:  June 07, 2021. FINDINGS: Intrauterine gestational sac:  Single Yolk sac:  Visualized. Embryo:  Visualized. Cardiac Activity: Visualized. Heart Rate: 173 bpm CRL:   24.6 mm   9 w 1 d                  [place] EDC: September 28, 2023. Subchorionic hemorrhage:  None visualized. Maternal uterus/adnexae: Right ovary is normal. Probable corpus luteum cyst on the left. No free fluid is noted. IMPRESSION: Single live intrauterine gestation of 9 weeks 1 day. Electronically Signed   By: Lupita Raider M.D.   On: 02/24/2023 13:44    MAU Management/MDM: Orders Placed This Encounter  Procedures  . DG Chest Portable 1 View  . Urinalysis, Routine w reflex microscopic -Urine, Clean Catch  . ED EKG  Meds ordered this encounter  Medications  . NIFEdipine (PROCARDIA) capsule 20 mg    Consult ***.  Treatments in MAU included ***. Pt discharged with strict *** precautions.  ASSESSMENT No diagnosis found.  PLAN Discharge home Allergies as of 03/01/2023   No Known Allergies   Med Rec must be completed prior to using this [facility]***        Sharen Counter Certified Nurse-Midwife 03/01/2023  5:03 PM

## 2023-03-10 ENCOUNTER — Other Ambulatory Visit (HOSPITAL_COMMUNITY)
Admission: RE | Admit: 2023-03-10 | Discharge: 2023-03-10 | Disposition: A | Discharge status: Home / Self Care | Payer: Medicaid Other | Source: Ambulatory Visit | Attending: Obstetrics and Gynecology | Admitting: Obstetrics and Gynecology

## 2023-03-10 ENCOUNTER — Encounter: Payer: Self-pay | Admitting: Obstetrics and Gynecology

## 2023-03-10 ENCOUNTER — Ambulatory Visit (INDEPENDENT_AMBULATORY_CARE_PROVIDER_SITE_OTHER): Payer: Medicaid Other | Admitting: Obstetrics and Gynecology

## 2023-03-10 VITALS — BP 155/102 | HR 80 | Wt 297.0 lb

## 2023-03-10 DIAGNOSIS — Z6841 Body Mass Index (BMI) 40.0 and over, adult: Secondary | ICD-10-CM

## 2023-03-10 DIAGNOSIS — O0990 Supervision of high risk pregnancy, unspecified, unspecified trimester: Secondary | ICD-10-CM

## 2023-03-10 DIAGNOSIS — Z31430 Encounter of female for testing for genetic disease carrier status for procreative management: Secondary | ICD-10-CM | POA: Diagnosis not present

## 2023-03-10 DIAGNOSIS — Z3481 Encounter for supervision of other normal pregnancy, first trimester: Secondary | ICD-10-CM

## 2023-03-10 DIAGNOSIS — N760 Acute vaginitis: Secondary | ICD-10-CM

## 2023-03-10 DIAGNOSIS — B9689 Other specified bacterial agents as the cause of diseases classified elsewhere: Secondary | ICD-10-CM | POA: Diagnosis not present

## 2023-03-10 DIAGNOSIS — B3731 Acute candidiasis of vulva and vagina: Secondary | ICD-10-CM | POA: Diagnosis not present

## 2023-03-10 DIAGNOSIS — O10911 Unspecified pre-existing hypertension complicating pregnancy, first trimester: Secondary | ICD-10-CM

## 2023-03-10 DIAGNOSIS — Z3A10 10 weeks gestation of pregnancy: Secondary | ICD-10-CM

## 2023-03-10 DIAGNOSIS — O10919 Unspecified pre-existing hypertension complicating pregnancy, unspecified trimester: Secondary | ICD-10-CM | POA: Diagnosis not present

## 2023-03-10 DIAGNOSIS — R7309 Other abnormal glucose: Secondary | ICD-10-CM

## 2023-03-10 DIAGNOSIS — O0991 Supervision of high risk pregnancy, unspecified, first trimester: Secondary | ICD-10-CM

## 2023-03-10 DIAGNOSIS — D509 Iron deficiency anemia, unspecified: Secondary | ICD-10-CM | POA: Diagnosis not present

## 2023-03-10 MED ORDER — NIFEDIPINE ER 60 MG PO TB24: 60.0000 mg | ORAL_TABLET | Freq: Every day | ORAL | 3 refills | Status: DC

## 2023-03-10 MED ORDER — BLOOD PRESSURE MONITORING DEVI
1.0000 | 0 refills | Status: AC
Start: 2023-03-10 — End: ?

## 2023-03-10 NOTE — Progress Notes (Signed)
History:   Shirley [name] is a 32 y.o. [id number] at [redacted]w[redacted]d by L=9 being seen today for her first obstetrical visit.   Patient does intend to breast feed.   Pregnancy history fully reviewed. Obstetrical history is significant for 3 SVDs.  Patient reports  vaginal discharge. Wanted testing for BV. Did a couple days of metrogel .      HISTORY: OB History  Gravida Para Term Preterm AB Living  7 3 3  0 3 3  SAB IAB Ectopic Multiple Live Births  0 3 0 0 3    # Outcome Date GA Lbr Len/2nd Weight Sex Type Anes PTL Lv  7 Current           6 Term 11/02/17 [redacted]w[redacted]d 20:29 / 00:04 6 lb 7.2 oz (2.925 kg) M Vag-Spont EPI  LIV     Name: Weltman,BOY Sadeen     Apgar1: 9  Apgar5: 9  5 IAB 08/01/09          4 Term 05/19/09 [redacted]w[redacted]d  6 lb 2 oz (2.778 kg) F Vag-Spont EPI N LIV     Complications: IUGR (intrauterine growth restriction) affecting care of mother, Chronic hypertension affecting pregnancy  3 Term 07/31/05 [redacted]w[redacted]d  6 lb 4 oz (2.835 kg) F Vag-Spont EPI N LIV     Complications: Shoulder Dystocia, Preeclampsia  2 IAB           1 IAB             Lab Results  Component Value Date   DIAGPAP  05/07/2017    NEGATIVE FOR INTRAEPITHELIAL LESIONS OR MALIGNANCY.     Past Medical History:  Diagnosis Date   Anxiety and depression 10/05/2019   Arthritis    Assistance needed with transportation 11/22/2019   Chest pain    Chronic low back pain 01/28/2018   Constipation    Depression    Disorder of hip joint 02/16/2018   Edema, lower extremity    Elevated alkaline phosphatase level 10/05/2019   GERD (gastroesophageal reflux disease)    Hypertension    Joint pain    Low HDL (under 40) 10/05/2019   Neck pain 07/16/2019   Obesity    Osteoarthritis    Rheumatoid arthritis (HCC)    Sciatica    Shortness of breath    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   MRI (06/11/2016)  T11-T12: degenerative disc bulge and facet hypertrophy w/o significant stenosis  L4-L5: mild b/l  facet arthrosis  L5-S1: mild b/l facet arthrosis, sm central disc protrusion w/o significant stenosis or frank neural impingement and mod b/l foraminal narrowing at L5-S1 related to disc bulge and facet disease, right worse than left     Vitamin D deficiency 10/05/2019   Past Surgical History:  Procedure Laterality Date   TOOTH EXTRACTION N/A 10/22/2013   Procedure: DENTAL EXTRACTIONS TEETH #1, 16, 17, 32;  Surgeon: Georgia Lopes, DDS;  Location: [facility] OR;  Service: Oral Surgery;  Laterality: N/A;   Family History  Problem Relation Age of Onset   Hypertension Mother    Depression Mother    Anxiety disorder Mother    Obesity Mother    Diabetes Maternal Grandmother    Diabetes Paternal Grandmother    Stroke Paternal Grandmother    Hypertension Father    Social History   Tobacco Use   Smoking status: Former    Current packs/day: 0.00    Types: Cigarettes    Quit date: 01/29/2017  Years since quitting: 6.1   Smokeless tobacco: Never  Substance Use Topics   Alcohol use: No    Alcohol/week: 0.0 standard drinks of alcohol    Comment: rare   Drug use: No    Types: Marijuana    Comment: daily usage   No Known Allergies Current Outpatient Medications on File Prior to Visit  Medication Sig Dispense Refill   Prenatal Vit-Fe Fumarate-FA (PREPLUS) 27-1 MG TABS Take 1 tablet by mouth daily. 30 tablet 13   cyclobenzaprine (FLEXERIL) 10 MG tablet Take 1 tablet (10 mg total) by mouth 3 (three) times daily as needed for muscle spasms. (Patient not taking: Reported on 03/10/2023) 90 tablet 0   metFORMIN (GLUCOPHAGE) 500 MG tablet Take 1 tablet (500 mg total) by mouth daily in the evening with largest meal. (Patient not taking: Reported on 03/10/2023) 30 tablet 0   No current facility-administered medications on file prior to visit.    Review of Systems Pertinent items noted in HPI and remainder of comprehensive ROS otherwise negative.  Physical Exam:   Vitals:   03/10/23 1536  BP: (!)  181/107  Pulse: 80  Weight: 297 lb (134.7 kg)     Bedside Ultrasound for FHR check: Viable intrauterine pregnancy with positive cardiac activity noted, fetal heart rate 181 bpm  Patient informed that the ultrasound is considered a limited obstetric ultrasound and is not intended to be a complete ultrasound exam.  Patient also informed that the ultrasound is not being completed with the intent of assessing for fetal or placental anomalies or any pelvic abnormalities.  Explained that the purpose of today's ultrasound is to assess for fetal heart rate.  Patient acknowledges the purpose of the exam and the limitations of the study.  General: well-developed, well-nourished female in no acute distress  Breasts:  normal appearance, no masses or tenderness bilaterally  Skin: normal coloration and turgor, no rashes  Neurologic: oriented, normal, negative, normal mood  Extremities: normal strength, tone, and muscle mass, ROM of all joints is normal  HEENT PERRLA, extraocular movement intact and sclera clear, anicteric  Neck supple and no masses  Cardiovascular: regular rate and rhythm  Respiratory:  no respiratory distress, normal breath sounds  Abdomen: soft, non-tender; bowel sounds normal; no masses,  no organomegaly  Pelvic: normal external genitalia, no lesions, normal vaginal mucosa, normal vaginal discharge, normal cervix, pap smear done. Uterine size:  aga    Assessment:    Pregnancy: [id number] Patient Active Problem List   Diagnosis Date Noted   Supervision of high risk pregnancy, antepartum 02/27/2023   Chronic hypertension affecting pregnancy 10/27/2017   Osteoarthritis of both knees 09/29/2015   Essential hypertension    Class 3 severe obesity with serious comorbidity and body mass index (BMI) of 50.0 to 59.9 in adult [facility]) 12/02/2006   Primary hypertension 08/28/2006     Plan:    1. Supervision of high risk pregnancy, antepartum Initial labs ordered. Discussed ACURE4MOMs. Pt  accepts Continue prenatal vitamins. Problem list reviewed and updated. Genetic Screening discussed, NIPS: ordered. Ultrasound discussed; fetal anatomic survey: ordered. Anticipatory guidance about prenatal visits given including labs, ultrasounds, and testing. Discussed usage of Babyscripts and virtual visits Culture done for possible BV vs yeast.  Pap smear done.  - Blood Pressure Monitoring DEVI; 1 each by Does not apply route once a week.  Dispense: 1 each; Refill: 0  2. Class 3 severe obesity with serious comorbidity and body mass index (BMI) of 50.0 to 59.9 in adult, unspecified obesity  type [facility]) Will need serial growth  3. Chronic hypertension affecting pregnancy Recheck much improved. Discussed importance of BP control. She has had financial barriers to copay for procardia. Changed Rx to 90 day.  - Blood Pressure Monitoring DEVI; 1 each by Does not apply route once a week.  Dispense: 1 each; Refill: 0   The nature of [facility] with multiple MDs and Advanced Practice Providers was explained to patient; also emphasized that residents, students are part of our team. Routine obstetric precautions reviewed. Encouraged to seek out care at office or emergency room [facility] MAU preferred) for urgent and/or emergent concerns. Return in about 2 weeks (around 03/24/2023) for HROB VISIT.    Milas Hock, MD, FACOG Obstetrician & Gynecologist, [facility] for [facility]

## 2023-03-10 NOTE — Addendum Note (Signed)
Addended byVidal Schwalbe on: 03/10/2023 05:15 PM   Modules accepted: Orders

## 2023-03-11 ENCOUNTER — Other Ambulatory Visit: Payer: Medicaid Other

## 2023-03-11 LAB — PROTEIN / CREATININE RATIO, URINE
Creatinine, Urine: 273.4 mg/dL
Protein, Ur: 14.5 mg/dL
Protein/Creat Ratio: 53 mg/g{creat} (ref 0–200)

## 2023-03-12 ENCOUNTER — Encounter: Payer: Self-pay | Admitting: Obstetrics and Gynecology

## 2023-03-12 ENCOUNTER — Other Ambulatory Visit: Payer: Medicaid Other

## 2023-03-12 ENCOUNTER — Other Ambulatory Visit: Payer: Self-pay

## 2023-03-12 DIAGNOSIS — O0990 Supervision of high risk pregnancy, unspecified, unspecified trimester: Secondary | ICD-10-CM

## 2023-03-12 DIAGNOSIS — Z3481 Encounter for supervision of other normal pregnancy, first trimester: Secondary | ICD-10-CM | POA: Diagnosis not present

## 2023-03-12 DIAGNOSIS — Z31430 Encounter of female for testing for genetic disease carrier status for procreative management: Secondary | ICD-10-CM | POA: Diagnosis not present

## 2023-03-12 LAB — CERVICOVAGINAL ANCILLARY ONLY
Bacterial Vaginitis (gardnerella): POSITIVE — AB
Candida Glabrata: NEGATIVE
Candida Vaginitis: POSITIVE — AB
Chlamydia: NEGATIVE
Comment: NEGATIVE
Comment: NEGATIVE
Comment: NEGATIVE
Comment: NEGATIVE
Comment: NEGATIVE
Comment: NORMAL
Neisseria Gonorrhea: NEGATIVE
Trichomonas: NEGATIVE

## 2023-03-12 LAB — CULTURE, OB URINE

## 2023-03-12 LAB — CYTOLOGY - PAP
Comment: NEGATIVE
Diagnosis: NEGATIVE
High risk HPV: NEGATIVE

## 2023-03-12 LAB — URINE CULTURE, OB REFLEX

## 2023-03-12 MED ORDER — TERCONAZOLE 0.4 % VA CREA
1.0000 | TOPICAL_CREAM | Freq: Every day | VAGINAL | 0 refills | Status: DC
Start: 2023-03-12 — End: 2023-04-10

## 2023-03-12 MED ORDER — METRONIDAZOLE 500 MG PO TABS
500.0000 mg | ORAL_TABLET | Freq: Two times a day (BID) | ORAL | 0 refills | Status: DC
Start: 2023-03-12 — End: 2023-07-10

## 2023-03-12 NOTE — Addendum Note (Signed)
Addended by: Milas Hock A on: 03/12/2023 03:05 PM   Modules accepted: Orders

## 2023-03-13 ENCOUNTER — Encounter: Payer: Self-pay | Admitting: *Deleted

## 2023-03-13 LAB — CBC/D/PLT+RPR+RH+ABO+RUBIGG...
Antibody Screen: NEGATIVE
Basophils Absolute: 0 10*3/uL (ref 0.0–0.2)
Basos: 0 %
EOS (ABSOLUTE): 0.1 10*3/uL (ref 0.0–0.4)
Eos: 1 %
HCV Ab: NONREACTIVE
HIV Screen 4th Generation wRfx: NONREACTIVE
Hematocrit: 32.9 % — ABNORMAL LOW (ref 34.0–46.6)
Hemoglobin: 10.5 g/dL — ABNORMAL LOW (ref 11.1–15.9)
Hepatitis B Surface Ag: NEGATIVE
Immature Grans (Abs): 0 10*3/uL (ref 0.0–0.1)
Immature Granulocytes: 0 %
Lymphocytes Absolute: 2 10*3/uL (ref 0.7–3.1)
Lymphs: 18 %
MCH: 26.1 pg — ABNORMAL LOW (ref 26.6–33.0)
MCHC: 31.9 g/dL (ref 31.5–35.7)
MCV: 82 fL (ref 79–97)
Monocytes Absolute: 0.6 10*3/uL (ref 0.1–0.9)
Monocytes: 5 %
Neutrophils Absolute: 8.4 10*3/uL — ABNORMAL HIGH (ref 1.4–7.0)
Neutrophils: 76 %
Platelets: 297 10*3/uL (ref 150–450)
RBC: 4.02 x10E6/uL (ref 3.77–5.28)
RDW: 14.5 % (ref 11.7–15.4)
RPR Ser Ql: NONREACTIVE
Rh Factor: POSITIVE
Rubella Antibodies, IGG: 1.36 {index} (ref >0.99)
WBC: 11.1 10*3/uL — ABNORMAL HIGH (ref 3.4–10.8)

## 2023-03-13 LAB — HEMOGLOBIN A1C
Est. average glucose Bld gHb Est-mCnc: 123 mg/dL
Hgb A1c MFr Bld: 5.9 % — ABNORMAL HIGH (ref 4.8–5.6)

## 2023-03-13 LAB — HCV INTERPRETATION

## 2023-03-13 LAB — TSH RFX ON ABNORMAL TO FREE T4: TSH: 0.919 u[IU]/mL (ref 0.450–4.500)

## 2023-03-14 DIAGNOSIS — R7309 Other abnormal glucose: Secondary | ICD-10-CM | POA: Insufficient documentation

## 2023-03-14 DIAGNOSIS — D509 Iron deficiency anemia, unspecified: Secondary | ICD-10-CM | POA: Insufficient documentation

## 2023-03-14 MED ORDER — FERROUS SULFATE 325 (65 FE) MG PO TBEC
325.0000 mg | DELAYED_RELEASE_TABLET | ORAL | 1 refills | Status: DC
Start: 2023-03-14 — End: 2023-08-12

## 2023-03-14 NOTE — Addendum Note (Signed)
Addended by: Milas Hock A on: 03/14/2023 11:54 AM   Modules accepted: Orders

## 2023-03-18 ENCOUNTER — Telehealth: Payer: Self-pay

## 2023-03-18 NOTE — Telephone Encounter (Addendum)
-----   Message from Milas Hock sent at 03/14/2023 11:54 AM EDT ----- She needs 2 hr gtt. Future order placed but needs to be scheduled.    Called pt. Would like to schedule with next OB visit. Added to schedule for 03/27/23.

## 2023-03-19 ENCOUNTER — Encounter: Payer: Self-pay | Admitting: Obstetrics and Gynecology

## 2023-03-24 ENCOUNTER — Encounter: Payer: Self-pay | Admitting: Obstetrics and Gynecology

## 2023-03-24 DIAGNOSIS — Z141 Cystic fibrosis carrier: Secondary | ICD-10-CM

## 2023-03-24 DIAGNOSIS — D563 Thalassemia minor: Secondary | ICD-10-CM | POA: Insufficient documentation

## 2023-03-24 HISTORY — DX: Thalassemia minor: D56.3

## 2023-03-24 HISTORY — DX: Cystic fibrosis carrier: Z14.1

## 2023-03-24 LAB — HORIZON CUSTOM: REPORT SUMMARY: POSITIVE — AB

## 2023-03-25 ENCOUNTER — Telehealth: Payer: Self-pay | Admitting: General Practice

## 2023-03-25 NOTE — Telephone Encounter (Signed)
Called patient at her request per mychart, no answer- left message to call us back or respond via mychart.

## 2023-03-25 NOTE — Telephone Encounter (Signed)
Patient called and left message on nurse voicemail line requesting a call back to discuss her blood pressure medicine.

## 2023-03-26 ENCOUNTER — Telehealth: Payer: Self-pay

## 2023-03-26 NOTE — Telephone Encounter (Signed)
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 I recommend follow up with genetic counseling and offer partner testing please. Thanks, pad

## 2023-03-26 NOTE — Telephone Encounter (Signed)
Attempted to call patient at number listed in chart--[phone]--sent to unidentified VM. Left message to call us back for any concerns.   Maureen Ralphs RN on 03/26/23 at [phone]

## 2023-03-26 NOTE — Telephone Encounter (Signed)
Attempted to call patient at number listed in chart--[phone]--sent to unidentified VM. Left message to call us back to review test results.   Maureen Ralphs RN on 03/26/23 at [phone]

## 2023-03-27 ENCOUNTER — Other Ambulatory Visit: Payer: Medicaid Other

## 2023-03-27 ENCOUNTER — Encounter: Payer: Medicaid Other | Admitting: Obstetrics and Gynecology

## 2023-03-31 NOTE — Telephone Encounter (Signed)
I called Shirley [name] and explained I am calling back to follow up with her about her call about her bp meds. She informed me she stopped taking her blood pressure med about 5-6 days ago because it was causing her to have headaches and dizziness. She states she had been on 30 mg but then changed to 60 mg at hospital. We discussed she will need medication and I verified she had missed her last ob visit. I explained I will have front desk reschedule makeup ob asap but in meantime will have her come in for nurse visit for bp check. She agreed to 04/02/23.I transferred call to front desk to schedule makeup ob.  Nancy Fetter

## 2023-04-02 ENCOUNTER — Ambulatory Visit: Payer: Medicaid Other

## 2023-04-03 ENCOUNTER — Encounter: Payer: Self-pay | Admitting: General Practice

## 2023-04-07 ENCOUNTER — Other Ambulatory Visit: Payer: Self-pay

## 2023-04-07 ENCOUNTER — Ambulatory Visit (INDEPENDENT_AMBULATORY_CARE_PROVIDER_SITE_OTHER): Payer: Medicaid Other | Admitting: Family Medicine

## 2023-04-07 VITALS — BP 162/123 | HR 69 | Wt 305.3 lb

## 2023-04-07 DIAGNOSIS — O0992 Supervision of high risk pregnancy, unspecified, second trimester: Secondary | ICD-10-CM

## 2023-04-07 DIAGNOSIS — Z3A14 14 weeks gestation of pregnancy: Secondary | ICD-10-CM

## 2023-04-07 DIAGNOSIS — E66813 Obesity, class 3: Secondary | ICD-10-CM

## 2023-04-07 DIAGNOSIS — Z6841 Body Mass Index (BMI) 40.0 and over, adult: Secondary | ICD-10-CM

## 2023-04-07 DIAGNOSIS — O0990 Supervision of high risk pregnancy, unspecified, unspecified trimester: Secondary | ICD-10-CM

## 2023-04-07 DIAGNOSIS — O10919 Unspecified pre-existing hypertension complicating pregnancy, unspecified trimester: Secondary | ICD-10-CM

## 2023-04-07 DIAGNOSIS — D508 Other iron deficiency anemias: Secondary | ICD-10-CM | POA: Diagnosis not present

## 2023-04-07 DIAGNOSIS — O09292 Supervision of pregnancy with other poor reproductive or obstetric history, second trimester: Secondary | ICD-10-CM

## 2023-04-07 DIAGNOSIS — O10912 Unspecified pre-existing hypertension complicating pregnancy, second trimester: Secondary | ICD-10-CM

## 2023-04-07 MED ORDER — ASPIRIN 81 MG PO TBEC: 162.0000 mg | DELAYED_RELEASE_TABLET | Freq: Every day | ORAL | 2 refills | Status: DC

## 2023-04-07 MED ORDER — LABETALOL HCL 200 MG PO TABS
200.0000 mg | ORAL_TABLET | Freq: Two times a day (BID) | ORAL | 6 refills | Status: DC
Start: 2023-04-07 — End: 2023-05-08

## 2023-04-07 MED ORDER — NIFEDIPINE ER 30 MG PO TB24
60.0000 mg | ORAL_TABLET | Freq: Every day | ORAL | 6 refills | Status: DC
Start: 2023-04-07 — End: 2023-07-10

## 2023-04-07 NOTE — Progress Notes (Unsigned)
   PRENATAL VISIT NOTE  Subjective:  Shirley [name] is a 32 y.o. [phone] at [redacted]w[redacted]d being seen today for ongoing prenatal care.  She is currently monitored for the following issues for this high-risk pregnancy and has Class 3 severe obesity with serious comorbidity and body mass index (BMI) of 50.0 to 59.9 in adult [facility]); Primary hypertension; Essential hypertension; Osteoarthritis of both knees; Chronic hypertension affecting pregnancy; Supervision of high risk pregnancy, antepartum; Iron deficiency anemia; Elevated hemoglobin A1c; Cystic fibrosis carrier; and Alpha thalassemia silent carrier on their problem list.  Patient reports no complaints.  Contractions: Not present. Vag. Bleeding: None.  Movement: Absent. Denies leaking of fluid.   The following portions of the patient's history were reviewed and updated as appropriate: allergies, current medications, past family history, past medical history, past social history, past surgical history and problem list.   Objective:   Vitals:   04/07/23 1541  BP: (!) 174/130  Pulse: 86  Weight: (!) 305 lb 4.8 oz (138.5 kg)    Fetal Status: Fetal Heart Rate (bpm): 156   Movement: Absent     General:  Alert, oriented and cooperative. Patient is in no acute distress.  Skin: Skin is warm and dry. No rash noted.   Cardiovascular: Normal heart rate noted  Respiratory: Normal respiratory effort, no problems with respiration noted  Abdomen: Soft, gravid, appropriate for gestational age.  Pain/Pressure: Present (Pressure at times)     Pelvic: Cervical exam deferred        Extremities: Normal range of motion.  Edema: None  Mental Status: Normal mood and affect. Normal behavior. Normal judgment and thought content.   Assessment and Plan:  Pregnancy: [id number] at [redacted]w[redacted]d 1. Chronic hypertension affecting pregnancy Not taking her medications because they "make her dizzy" Prescribed Procardia 60mg  XL-- reports tolerating procardia 30mg  better in the past and  it actually made her feel better.  Plan to start Procardia XL 30mg  today with Labetalol 200mg  BID. Patient agrees  Has baseline PEC labs that were normal  2. Class 3 severe obesity with serious comorbidity and body mass index (BMI) of 50.0 to 59.9 in adult, unspecified obesity type (HCC) TWG=10 lb 4.8 oz (4.672 kg)   3. Other iron deficiency anemia Last HGB 10.5 Mild in nature and was prescribed iron at that time.   4. Supervision of high risk pregnancy, antepartum Reports she is not taking her prenatal vitamin and reports that it was "too expensive"  Preterm labor symptoms and general obstetric precautions including but not limited to vaginal bleeding, contractions, leaking of fluid and fetal movement were reviewed in detail with the patient. Please refer to After Visit Summary for other counseling recommendations.   No follow-ups on file.  Future Appointments  Date Time Provider Department Center  05/08/2023  8:15 AM [facility] NURSE [facility]  05/08/2023  8:30 AM [facility] US3 [facility]    Federico Flake, MD

## 2023-05-08 ENCOUNTER — Ambulatory Visit: Payer: Medicaid Other

## 2023-05-08 ENCOUNTER — Encounter: Payer: Medicaid Other | Admitting: Family Medicine

## 2023-05-08 ENCOUNTER — Telehealth: Payer: Medicaid Other | Admitting: Family Medicine

## 2023-05-08 ENCOUNTER — Encounter: Payer: Self-pay | Admitting: Lactation Services

## 2023-05-08 ENCOUNTER — Telehealth: Payer: Self-pay | Admitting: Family Medicine

## 2023-05-08 DIAGNOSIS — O0990 Supervision of high risk pregnancy, unspecified, unspecified trimester: Secondary | ICD-10-CM

## 2023-05-08 DIAGNOSIS — Z3A19 19 weeks gestation of pregnancy: Secondary | ICD-10-CM | POA: Diagnosis not present

## 2023-05-08 DIAGNOSIS — O10012 Pre-existing essential hypertension complicating pregnancy, second trimester: Secondary | ICD-10-CM | POA: Diagnosis not present

## 2023-05-08 DIAGNOSIS — Z141 Cystic fibrosis carrier: Secondary | ICD-10-CM

## 2023-05-08 DIAGNOSIS — O0992 Supervision of high risk pregnancy, unspecified, second trimester: Secondary | ICD-10-CM | POA: Diagnosis not present

## 2023-05-08 DIAGNOSIS — O10919 Unspecified pre-existing hypertension complicating pregnancy, unspecified trimester: Secondary | ICD-10-CM

## 2023-05-08 MED ORDER — LABETALOL HCL 200 MG PO TABS
400.0000 mg | ORAL_TABLET | Freq: Two times a day (BID) | ORAL | 6 refills | Status: DC
Start: 2023-05-08 — End: 2023-07-10

## 2023-05-08 NOTE — Telephone Encounter (Signed)
Patient wants to know the results of the saliva kit.

## 2023-05-08 NOTE — Telephone Encounter (Signed)
Called patient to assess when testing was submitted and the name of her Partner to investigate results.   She did not answer. LM for her to call the office or check her Mychart message for information needed.

## 2023-05-09 ENCOUNTER — Encounter: Payer: Self-pay | Admitting: Family Medicine

## 2023-05-09 NOTE — Progress Notes (Signed)
   OBSTETRICS PRENATAL VIRTUAL VISIT ENCOUNTER NOTE  Provider location: Center for [facility] at [facility]   Patient location: Home  I connected with Shirley [name] on 05/09/23 at 10:15 AM EST by MyChart Video Encounter and verified that I am speaking with the correct person using two identifiers. I discussed the limitations, risks, security and privacy concerns of performing an evaluation and management service virtually and the availability of in person appointments. I also discussed with the patient that there may be a patient responsible charge related to this service. The patient expressed understanding and agreed to proceed. Subjective:  Shirley [name] is a 32 y.o. [phone] at [redacted]w[redacted]d being seen today for ongoing prenatal care.  She is currently monitored for the following issues for this high-risk pregnancy and has Class 3 severe obesity with serious comorbidity and body mass index (BMI) of 50.0 to 59.9 in adult [facility]); Primary hypertension; Essential hypertension; Osteoarthritis of both knees; Chronic hypertension affecting pregnancy; Supervision of high risk pregnancy, antepartum; Iron deficiency anemia; Elevated hemoglobin A1c; Cystic fibrosis carrier; Alpha thalassemia silent carrier; and Hx of preeclampsia, prior pregnancy, currently pregnant, second trimester on their problem list.  Patient reports no complaints.  Contractions: Not present. Vag. Bleeding: None.  Movement: Absent. Denies any leaking of fluid.   The following portions of the patient's history were reviewed and updated as appropriate: allergies, current medications, past family history, past medical history, past social history, past surgical history and problem list.   Objective:  There were no vitals filed for this visit.  Fetal Status:     Movement: Absent     General:  Alert, oriented and cooperative. Patient is in no acute distress.  Respiratory: Normal respiratory effort, no problems with  respiration noted  Mental Status: Normal mood and affect. Normal behavior. Normal judgment and thought content.  Rest of physical exam deferred due to type of encounter  Imaging: No results found.  Assessment and Plan:  Pregnancy: [id number] at [redacted]w[redacted]d 1. Chronic hypertension affecting pregnancy Reports BPs at home are still in the 140-150 range On Procardia 30 XL, the 60 mg made her dizzy. On labetalol 200 mg bid--will increase to 400 mg bid On ASA - labetalol (NORMODYNE) 200 MG tablet; Take 2 tablets (400 mg total) by mouth 2 (two) times daily.  Dispense: 120 tablet; Refill: 6  2. Cystic fibrosis carrier Awaiting partner testing  3. Supervision of high risk pregnancy, antepartum Has u/s re-scheduled to December  4. [redacted] weeks gestation of pregnancy   General obstetric precautions including but not limited to vaginal bleeding, contractions, leaking of fluid and fetal movement were reviewed in detail with the patient. I discussed the assessment and treatment plan with the patient. The patient was provided an opportunity to ask questions and all were answered. The patient agreed with the plan and demonstrated an understanding of the instructions. The patient was advised to call back or seek an in-person office evaluation/go to MAU at [facility] for any urgent or concerning symptoms. Please refer to After Visit Summary for other counseling recommendations.   I provided 7 minutes of face-to-face time during this encounter.  Return in 2 weeks (on 05/22/2023) for HRC, 28 wk labs.  Future Appointments  Date Time Provider Department Center  06/09/2023 10:15 AM [facility] NURSE [facility]  06/09/2023 10:30 AM [facility] US5 [facility]    Reva Bores, MD Center for [facility]

## 2023-06-03 ENCOUNTER — Ambulatory Visit: Payer: Medicaid Other

## 2023-06-03 ENCOUNTER — Other Ambulatory Visit: Payer: Self-pay

## 2023-06-03 ENCOUNTER — Ambulatory Visit: Payer: Medicaid Other | Attending: Obstetrics and Gynecology

## 2023-06-03 ENCOUNTER — Other Ambulatory Visit: Payer: Self-pay | Admitting: *Deleted

## 2023-06-03 DIAGNOSIS — O09292 Supervision of pregnancy with other poor reproductive or obstetric history, second trimester: Secondary | ICD-10-CM

## 2023-06-03 DIAGNOSIS — O99012 Anemia complicating pregnancy, second trimester: Secondary | ICD-10-CM | POA: Diagnosis not present

## 2023-06-03 DIAGNOSIS — O0990 Supervision of high risk pregnancy, unspecified, unspecified trimester: Secondary | ICD-10-CM | POA: Insufficient documentation

## 2023-06-03 DIAGNOSIS — Z3A22 22 weeks gestation of pregnancy: Secondary | ICD-10-CM

## 2023-06-03 DIAGNOSIS — O10012 Pre-existing essential hypertension complicating pregnancy, second trimester: Secondary | ICD-10-CM

## 2023-06-03 DIAGNOSIS — O10912 Unspecified pre-existing hypertension complicating pregnancy, second trimester: Secondary | ICD-10-CM

## 2023-06-03 DIAGNOSIS — O99212 Obesity complicating pregnancy, second trimester: Secondary | ICD-10-CM

## 2023-06-03 DIAGNOSIS — E669 Obesity, unspecified: Secondary | ICD-10-CM | POA: Diagnosis not present

## 2023-06-03 DIAGNOSIS — D563 Thalassemia minor: Secondary | ICD-10-CM | POA: Diagnosis not present

## 2023-06-09 ENCOUNTER — Ambulatory Visit: Payer: Medicaid Other

## 2023-06-09 ENCOUNTER — Encounter: Payer: Medicaid Other | Admitting: Obstetrics & Gynecology

## 2023-07-03 ENCOUNTER — Inpatient Hospital Stay (HOSPITAL_COMMUNITY): Payer: Medicaid Other

## 2023-07-03 ENCOUNTER — Other Ambulatory Visit: Payer: Self-pay

## 2023-07-03 ENCOUNTER — Inpatient Hospital Stay (HOSPITAL_COMMUNITY)
Admission: AD | Admit: 2023-07-03 | Discharge: 2023-07-10 | DRG: 783 | Disposition: A | Discharge status: Home / Self Care | Payer: Medicaid Other | Attending: Obstetrics & Gynecology | Admitting: Obstetrics & Gynecology

## 2023-07-03 ENCOUNTER — Encounter (HOSPITAL_COMMUNITY): Payer: Self-pay | Admitting: Obstetrics & Gynecology

## 2023-07-03 DIAGNOSIS — O9962 Diseases of the digestive system complicating childbirth: Secondary | ICD-10-CM | POA: Diagnosis not present

## 2023-07-03 DIAGNOSIS — K219 Gastro-esophageal reflux disease without esophagitis: Secondary | ICD-10-CM | POA: Diagnosis present

## 2023-07-03 DIAGNOSIS — O903 Peripartum cardiomyopathy: Secondary | ICD-10-CM | POA: Diagnosis not present

## 2023-07-03 DIAGNOSIS — D509 Iron deficiency anemia, unspecified: Secondary | ICD-10-CM | POA: Diagnosis not present

## 2023-07-03 DIAGNOSIS — O99012 Anemia complicating pregnancy, second trimester: Secondary | ICD-10-CM | POA: Diagnosis not present

## 2023-07-03 DIAGNOSIS — R7989 Other specified abnormal findings of blood chemistry: Secondary | ICD-10-CM

## 2023-07-03 DIAGNOSIS — N179 Acute kidney failure, unspecified: Secondary | ICD-10-CM | POA: Diagnosis not present

## 2023-07-03 DIAGNOSIS — I340 Nonrheumatic mitral (valve) insufficiency: Secondary | ICD-10-CM

## 2023-07-03 DIAGNOSIS — I429 Cardiomyopathy, unspecified: Secondary | ICD-10-CM

## 2023-07-03 DIAGNOSIS — O99892 Other specified diseases and conditions complicating childbirth: Secondary | ICD-10-CM | POA: Diagnosis not present

## 2023-07-03 DIAGNOSIS — Z79899 Other long term (current) drug therapy: Secondary | ICD-10-CM

## 2023-07-03 DIAGNOSIS — O1413 Severe pre-eclampsia, third trimester: Secondary | ICD-10-CM | POA: Diagnosis not present

## 2023-07-03 DIAGNOSIS — R7309 Other abnormal glucose: Secondary | ICD-10-CM | POA: Diagnosis present

## 2023-07-03 DIAGNOSIS — O9952 Diseases of the respiratory system complicating childbirth: Secondary | ICD-10-CM | POA: Diagnosis not present

## 2023-07-03 DIAGNOSIS — Z141 Cystic fibrosis carrier: Secondary | ICD-10-CM | POA: Diagnosis not present

## 2023-07-03 DIAGNOSIS — I161 Hypertensive emergency: Secondary | ICD-10-CM | POA: Diagnosis not present

## 2023-07-03 DIAGNOSIS — I9581 Postprocedural hypotension: Secondary | ICD-10-CM | POA: Diagnosis not present

## 2023-07-03 DIAGNOSIS — O99212 Obesity complicating pregnancy, second trimester: Secondary | ICD-10-CM | POA: Diagnosis not present

## 2023-07-03 DIAGNOSIS — Z833 Family history of diabetes mellitus: Secondary | ICD-10-CM

## 2023-07-03 DIAGNOSIS — O10012 Pre-existing essential hypertension complicating pregnancy, second trimester: Secondary | ICD-10-CM | POA: Diagnosis not present

## 2023-07-03 DIAGNOSIS — O26892 Other specified pregnancy related conditions, second trimester: Secondary | ICD-10-CM | POA: Diagnosis not present

## 2023-07-03 DIAGNOSIS — R001 Bradycardia, unspecified: Secondary | ICD-10-CM | POA: Diagnosis not present

## 2023-07-03 DIAGNOSIS — Z3A27 27 weeks gestation of pregnancy: Principal | ICD-10-CM

## 2023-07-03 DIAGNOSIS — I10 Essential (primary) hypertension: Secondary | ICD-10-CM

## 2023-07-03 DIAGNOSIS — J810 Acute pulmonary edema: Secondary | ICD-10-CM | POA: Diagnosis not present

## 2023-07-03 DIAGNOSIS — E871 Hypo-osmolality and hyponatremia: Secondary | ICD-10-CM | POA: Diagnosis present

## 2023-07-03 DIAGNOSIS — Z302 Encounter for sterilization: Secondary | ICD-10-CM

## 2023-07-03 DIAGNOSIS — Z5986 Financial insecurity: Secondary | ICD-10-CM

## 2023-07-03 DIAGNOSIS — O114 Pre-existing hypertension with pre-eclampsia, complicating childbirth: Principal | ICD-10-CM | POA: Diagnosis present

## 2023-07-03 DIAGNOSIS — O1092 Unspecified pre-existing hypertension complicating childbirth: Secondary | ICD-10-CM | POA: Diagnosis present

## 2023-07-03 DIAGNOSIS — O10013 Pre-existing essential hypertension complicating pregnancy, third trimester: Secondary | ICD-10-CM | POA: Diagnosis not present

## 2023-07-03 DIAGNOSIS — O99214 Obesity complicating childbirth: Secondary | ICD-10-CM | POA: Diagnosis present

## 2023-07-03 DIAGNOSIS — O9902 Anemia complicating childbirth: Secondary | ICD-10-CM | POA: Diagnosis present

## 2023-07-03 DIAGNOSIS — O1415 Severe pre-eclampsia, complicating the puerperium: Secondary | ICD-10-CM | POA: Diagnosis not present

## 2023-07-03 DIAGNOSIS — Z3A00 Weeks of gestation of pregnancy not specified: Secondary | ICD-10-CM | POA: Diagnosis not present

## 2023-07-03 DIAGNOSIS — Z8249 Family history of ischemic heart disease and other diseases of the circulatory system: Secondary | ICD-10-CM

## 2023-07-03 DIAGNOSIS — Z5982 Transportation insecurity: Secondary | ICD-10-CM | POA: Diagnosis not present

## 2023-07-03 DIAGNOSIS — Z3A23 23 weeks gestation of pregnancy: Secondary | ICD-10-CM | POA: Diagnosis not present

## 2023-07-03 DIAGNOSIS — E669 Obesity, unspecified: Secondary | ICD-10-CM | POA: Diagnosis not present

## 2023-07-03 DIAGNOSIS — Z8759 Personal history of other complications of pregnancy, childbirth and the puerperium: Secondary | ICD-10-CM | POA: Diagnosis not present

## 2023-07-03 DIAGNOSIS — Z91148 Patient's other noncompliance with medication regimen for other reason: Secondary | ICD-10-CM

## 2023-07-03 DIAGNOSIS — O10919 Unspecified pre-existing hypertension complicating pregnancy, unspecified trimester: Secondary | ICD-10-CM | POA: Diagnosis present

## 2023-07-03 DIAGNOSIS — O1412 Severe pre-eclampsia, second trimester: Secondary | ICD-10-CM | POA: Diagnosis not present

## 2023-07-03 DIAGNOSIS — Z87891 Personal history of nicotine dependence: Secondary | ICD-10-CM | POA: Diagnosis not present

## 2023-07-03 DIAGNOSIS — Z91199 Patient's noncompliance with other medical treatment and regimen due to unspecified reason: Secondary | ICD-10-CM

## 2023-07-03 DIAGNOSIS — O0990 Supervision of high risk pregnancy, unspecified, unspecified trimester: Secondary | ICD-10-CM

## 2023-07-03 DIAGNOSIS — I517 Cardiomegaly: Secondary | ICD-10-CM | POA: Diagnosis not present

## 2023-07-03 DIAGNOSIS — O1492 Unspecified pre-eclampsia, second trimester: Secondary | ICD-10-CM | POA: Diagnosis not present

## 2023-07-03 DIAGNOSIS — E66813 Obesity, class 3: Secondary | ICD-10-CM | POA: Diagnosis present

## 2023-07-03 DIAGNOSIS — O09292 Supervision of pregnancy with other poor reproductive or obstetric history, second trimester: Secondary | ICD-10-CM | POA: Diagnosis not present

## 2023-07-03 DIAGNOSIS — D563 Thalassemia minor: Secondary | ICD-10-CM | POA: Diagnosis present

## 2023-07-03 DIAGNOSIS — J9601 Acute respiratory failure with hypoxia: Secondary | ICD-10-CM | POA: Diagnosis not present

## 2023-07-03 DIAGNOSIS — O1490 Unspecified pre-eclampsia, unspecified trimester: Secondary | ICD-10-CM

## 2023-07-03 DIAGNOSIS — O99412 Diseases of the circulatory system complicating pregnancy, second trimester: Secondary | ICD-10-CM | POA: Diagnosis not present

## 2023-07-03 DIAGNOSIS — Z6841 Body Mass Index (BMI) 40.0 and over, adult: Secondary | ICD-10-CM

## 2023-07-03 DIAGNOSIS — Z148 Genetic carrier of other disease: Secondary | ICD-10-CM

## 2023-07-03 HISTORY — DX: Hypertensive emergency: I16.1

## 2023-07-03 LAB — COMPREHENSIVE METABOLIC PANEL
ALT: 8 U/L (ref 0–44)
AST: 14 U/L — ABNORMAL LOW (ref 15–41)
Albumin: 1.9 g/dL — ABNORMAL LOW (ref 3.5–5.0)
Alkaline Phosphatase: 111 U/L (ref 38–126)
Anion gap: 11 (ref 5–15)
BUN: 11 mg/dL (ref 6–20)
CO2: 23 mmol/L (ref 22–32)
Calcium: 8.5 mg/dL — ABNORMAL LOW (ref 8.9–10.3)
Chloride: 101 mmol/L (ref 98–111)
Creatinine, Ser: 1.02 mg/dL — ABNORMAL HIGH (ref 0.44–1.00)
GFR, Estimated: >60 mL/min (ref >60)
Glucose, Bld: 104 mg/dL — ABNORMAL HIGH (ref 70–99)
Potassium: 3.4 mmol/L — ABNORMAL LOW (ref 3.5–5.1)
Sodium: 135 mmol/L (ref 135–145)
Total Bilirubin: 0.4 mg/dL (ref 0.0–1.2)
Total Protein: 6.3 g/dL — ABNORMAL LOW (ref 6.5–8.1)

## 2023-07-03 LAB — CBC
HCT: 33.7 % — ABNORMAL LOW (ref 36.0–46.0)
Hemoglobin: 10.8 g/dL — ABNORMAL LOW (ref 12.0–15.0)
MCH: 27 pg (ref 26.0–34.0)
MCHC: 32 g/dL (ref 30.0–36.0)
MCV: 84.3 fL (ref 80.0–100.0)
Platelets: 264 10*3/uL (ref 150–400)
RBC: 4 MIL/uL (ref 3.87–5.11)
RDW: 15.1 % (ref 11.5–15.5)
WBC: 10.7 10*3/uL — ABNORMAL HIGH (ref 4.0–10.5)
nRBC: 0.9 % — ABNORMAL HIGH (ref 0.0–0.2)

## 2023-07-03 LAB — PROTEIN / CREATININE RATIO, URINE
Creatinine, Urine: 112 mg/dL
Protein Creatinine Ratio: 2.88 mg/mg{creat} — ABNORMAL HIGH (ref 0.00–0.15)
Total Protein, Urine: 322 mg/dL

## 2023-07-03 LAB — TYPE AND SCREEN
ABO/RH(D): O POS
Antibody Screen: NEGATIVE

## 2023-07-03 MED ORDER — LABETALOL HCL 5 MG/ML IV SOLN
80.0000 mg | INTRAVENOUS | Status: DC | PRN
  Administered 2023-07-03: 80 mg via INTRAVENOUS
  Filled 2023-07-03: qty 16

## 2023-07-03 MED ORDER — CALCIUM CARBONATE ANTACID 500 MG PO CHEW
2.0000 | CHEWABLE_TABLET | ORAL | Status: DC | PRN
  Administered 2023-07-04 – 2023-07-06 (×2): 400 mg via ORAL
  Filled 2023-07-03 (×2): qty 2

## 2023-07-03 MED ORDER — NIFEDIPINE 10 MG PO CAPS
10.0000 mg | ORAL_CAPSULE | ORAL | Status: DC | PRN
  Administered 2023-07-05 – 2023-07-06 (×2): 10 mg via ORAL
  Filled 2023-07-03 (×3): qty 1

## 2023-07-03 MED ORDER — NIFEDIPINE ER OSMOTIC RELEASE 30 MG PO TB24
30.0000 mg | ORAL_TABLET | Freq: Once | ORAL | Status: AC
  Administered 2023-07-03: 30 mg via ORAL
  Filled 2023-07-03: qty 1

## 2023-07-03 MED ORDER — LABETALOL HCL 5 MG/ML IV SOLN
40.0000 mg | INTRAVENOUS | Status: DC | PRN
  Filled 2023-07-03: qty 8

## 2023-07-03 MED ORDER — ACETAMINOPHEN 325 MG PO TABS
650.0000 mg | ORAL_TABLET | ORAL | Status: DC | PRN
Start: 2023-07-03 — End: 2023-07-07
  Administered 2023-07-04 – 2023-07-07 (×2): 650 mg via ORAL
  Filled 2023-07-03 (×2): qty 2

## 2023-07-03 MED ORDER — NIFEDIPINE ER OSMOTIC RELEASE 30 MG PO TB24: 30.0000 mg | ORAL_TABLET | Freq: Every day | ORAL | Status: DC

## 2023-07-03 MED ORDER — BETAMETHASONE SOD PHOS & ACET 6 (3-3) MG/ML IJ SUSP
12.0000 mg | INTRAMUSCULAR | Status: AC
  Administered 2023-07-03 – 2023-07-04 (×2): 12 mg via INTRAMUSCULAR
  Filled 2023-07-03: qty 5

## 2023-07-03 MED ORDER — LABETALOL HCL 100 MG PO TABS
400.0000 mg | ORAL_TABLET | Freq: Two times a day (BID) | ORAL | Status: DC
  Administered 2023-07-03: 400 mg via ORAL
  Filled 2023-07-03: qty 4

## 2023-07-03 MED ORDER — LABETALOL HCL 5 MG/ML IV SOLN
20.0000 mg | INTRAVENOUS | Status: DC | PRN
Start: 2023-07-03 — End: 2023-07-06
  Administered 2023-07-03 – 2023-07-06 (×4): 20 mg via INTRAVENOUS
  Filled 2023-07-03 (×3): qty 4

## 2023-07-03 MED ORDER — LABETALOL HCL 5 MG/ML IV SOLN
INTRAVENOUS | Status: AC
  Filled 2023-07-03: qty 4

## 2023-07-03 MED ORDER — LABETALOL HCL 5 MG/ML IV SOLN: 80.0000 mg | INTRAVENOUS | Status: DC | PRN

## 2023-07-03 MED ORDER — LABETALOL HCL 5 MG/ML IV SOLN
40.0000 mg | INTRAVENOUS | Status: DC | PRN
Start: 2023-07-03 — End: 2023-07-03

## 2023-07-03 MED ORDER — CYCLOBENZAPRINE HCL 10 MG PO TABS
10.0000 mg | ORAL_TABLET | Freq: Three times a day (TID) | ORAL | Status: DC | PRN
  Administered 2023-07-03 – 2023-07-09 (×3): 10 mg via ORAL
  Filled 2023-07-03: qty 1
  Filled 2023-07-03: qty 2
  Filled 2023-07-03: qty 1

## 2023-07-03 MED ORDER — ACETAMINOPHEN 500 MG PO TABS
1000.0000 mg | ORAL_TABLET | Freq: Once | ORAL | Status: AC
  Administered 2023-07-03: 1000 mg via ORAL
  Filled 2023-07-03: qty 2

## 2023-07-03 MED ORDER — LABETALOL HCL 5 MG/ML IV SOLN
40.0000 mg | INTRAVENOUS | Status: DC | PRN
Start: 2023-07-03 — End: 2023-07-06
  Administered 2023-07-03 – 2023-07-06 (×2): 40 mg via INTRAVENOUS
  Filled 2023-07-03 (×3): qty 8

## 2023-07-03 MED ORDER — HYDRALAZINE HCL 20 MG/ML IJ SOLN
5.0000 mg | INTRAMUSCULAR | Status: DC | PRN
  Administered 2023-07-03: 5 mg via INTRAVENOUS
  Filled 2023-07-03 (×2): qty 1

## 2023-07-03 MED ORDER — ONDANSETRON HCL 4 MG/2ML IJ SOLN
4.0000 mg | Freq: Four times a day (QID) | INTRAMUSCULAR | Status: DC | PRN
  Administered 2023-07-03: 4 mg via INTRAVENOUS
  Filled 2023-07-03: qty 2

## 2023-07-03 MED ORDER — ASPIRIN 81 MG PO TBEC
162.0000 mg | DELAYED_RELEASE_TABLET | Freq: Every day | ORAL | Status: DC
  Administered 2023-07-03 – 2023-07-06 (×4): 162 mg via ORAL
  Filled 2023-07-03 (×4): qty 2

## 2023-07-03 MED ORDER — NIFEDIPINE ER OSMOTIC RELEASE 30 MG PO TB24: 60.0000 mg | ORAL_TABLET | Freq: Every day | ORAL | Status: DC

## 2023-07-03 MED ORDER — HYDRALAZINE HCL 20 MG/ML IJ SOLN: 10.0000 mg | INTRAMUSCULAR | Status: DC | PRN

## 2023-07-03 MED ORDER — LABETALOL HCL 5 MG/ML IV SOLN: 20.0000 mg | INTRAVENOUS | Status: DC | PRN

## 2023-07-03 MED ORDER — LABETALOL HCL 200 MG PO TABS
400.0000 mg | ORAL_TABLET | Freq: Three times a day (TID) | ORAL | Status: DC
  Administered 2023-07-03 – 2023-07-05 (×5): 400 mg via ORAL
  Filled 2023-07-03 (×5): qty 2

## 2023-07-03 MED ORDER — FERROUS SULFATE 325 (65 FE) MG PO TABS
325.0000 mg | ORAL_TABLET | ORAL | Status: DC
  Administered 2023-07-03 – 2023-07-09 (×3): 325 mg via ORAL
  Filled 2023-07-03 (×4): qty 1

## 2023-07-03 MED ORDER — MORPHINE SULFATE (PF) 4 MG/ML IV SOLN
2.0000 mg | Freq: Once | INTRAVENOUS | Status: AC
  Administered 2023-07-03: 2 mg via INTRAVENOUS
  Filled 2023-07-03: qty 1

## 2023-07-03 MED ORDER — HYDRALAZINE HCL 20 MG/ML IJ SOLN
10.0000 mg | INTRAMUSCULAR | Status: DC | PRN
  Administered 2023-07-03: 10 mg via INTRAVENOUS

## 2023-07-03 MED ORDER — SODIUM CHLORIDE 0.9% FLUSH: 3.0000 mL | INTRAVENOUS | Status: DC | PRN

## 2023-07-03 MED ORDER — NIFEDIPINE 10 MG PO CAPS
20.0000 mg | ORAL_CAPSULE | ORAL | Status: DC | PRN
Start: 2023-07-03 — End: 2023-07-07

## 2023-07-03 MED ORDER — NIFEDIPINE 10 MG PO CAPS
20.0000 mg | ORAL_CAPSULE | ORAL | Status: DC | PRN
  Filled 2023-07-03: qty 2

## 2023-07-03 MED ORDER — ZOLPIDEM TARTRATE 5 MG PO TABS
5.0000 mg | ORAL_TABLET | Freq: Every evening | ORAL | Status: DC | PRN
  Administered 2023-07-07: 5 mg via ORAL
  Filled 2023-07-03: qty 1

## 2023-07-03 MED ORDER — LABETALOL HCL 5 MG/ML IV SOLN
40.0000 mg | INTRAVENOUS | Status: DC | PRN
  Administered 2023-07-03: 40 mg via INTRAVENOUS
  Filled 2023-07-03: qty 8

## 2023-07-03 MED ORDER — SODIUM CHLORIDE 0.9% FLUSH
3.0000 mL | Freq: Two times a day (BID) | INTRAVENOUS | Status: DC
  Administered 2023-07-03 – 2023-07-06 (×6): 10 mL via INTRAVENOUS

## 2023-07-03 MED ORDER — NIFEDIPINE ER OSMOTIC RELEASE 60 MG PO TB24: 60.0000 mg | ORAL_TABLET | Freq: Every day | ORAL | Status: DC

## 2023-07-03 MED ORDER — NIFEDIPINE ER OSMOTIC RELEASE 30 MG PO TB24
30.0000 mg | ORAL_TABLET | Freq: Every day | ORAL | Status: DC
  Administered 2023-07-03: 30 mg via ORAL
  Filled 2023-07-03: qty 1

## 2023-07-03 MED ORDER — PRENATAL MULTIVITAMIN CH
1.0000 | ORAL_TABLET | Freq: Every day | ORAL | Status: DC
  Administered 2023-07-03 – 2023-07-06 (×4): 1 via ORAL
  Filled 2023-07-03 (×4): qty 1

## 2023-07-03 MED ORDER — DOCUSATE SODIUM 100 MG PO CAPS
100.0000 mg | ORAL_CAPSULE | Freq: Every day | ORAL | Status: DC
Start: 2023-07-03 — End: 2023-07-07
  Administered 2023-07-03 – 2023-07-06 (×4): 100 mg via ORAL
  Filled 2023-07-03 (×4): qty 1

## 2023-07-03 MED ORDER — LABETALOL HCL 5 MG/ML IV SOLN
20.0000 mg | INTRAVENOUS | Status: DC | PRN
  Administered 2023-07-03: 20 mg via INTRAVENOUS
  Filled 2023-07-03 (×2): qty 4

## 2023-07-03 NOTE — MAU Provider Note (Signed)
 Chief Complaint:  Headache and Hypertension  HPI  HPI: Shirley [name] is a 33 y.o. [phone] at 15w0dwho presents to maternity admissions via EMS reporting severe hypertension and headache. Hx of CHTN and Preeclampsia with prior pregnancy. Prescribed 400mg  Labetalol  BID and 30mg  Nifedipine  daily, but has been out for several days.   Description of Headaches: Location of pain: right-sided unilateral, temporal, retro-orbital, upper face without visual changed. Radiation of pain?:right-sided unilateral Character of pain:aching and throbbing Severity of pain: 10 Accompanying symptoms: eyelid edema and bilateral eye pain Prodromal sx?: eyelid edema Rapidity of onset: unknown  Temporal Pattern of Headaches: Started having HAs 3 days ago  Current Use of Meds to Treat HA: Abortive meds? NSAIDs (Aleve )   Per last office note on 11/7: 1. Chronic hypertension affecting pregnancy Reports BPs at home are still in the 140-150 range On Procardia  30 XL, the 60 mg made her dizzy. On labetalol  200 mg bid--will increase to 400 mg bid On ASA - labetalol  (NORMODYNE ) 200 MG tablet; Take 2 tablets (400 mg total) by mouth 2 (two) times daily.  Dispense: 120 tablet; Refill: 6  She reports good fetal movement, denies LOF, vaginal bleeding, vaginal itching/burning, urinary symptoms, h/a, dizziness, n/v, diarrhea, constipation or fever/chills.  She denies headache, visual changes or RUQ abdominal pain.  She is monitored at [facility] for this high-risk pregnancy and has Class 3 severe obesity with serious comorbidity and body mass index (BMI) of 50.0 to 59.9 in adult [facility]); Primary hypertension; Essential hypertension; Osteoarthritis of both knees; Chronic hypertension affecting pregnancy; Supervision of high risk pregnancy, antepartum; Iron  deficiency anemia; Elevated hemoglobin A1c; Cystic fibrosis carrier; Alpha thalassemia silent carrier; and Hx of preeclampsia, prior pregnancy, currently pregnant, second trimester  on their problem list.    Past Medical History: Past Medical History:  Diagnosis Date   Anxiety and depression 10/05/2019   Arthritis    Assistance needed with transportation 11/22/2019   Chest pain    Chronic low back pain 01/28/2018   Constipation    Depression    Disorder of hip joint 02/16/2018   Edema, lower extremity    Elevated alkaline phosphatase level 10/05/2019   GERD (gastroesophageal reflux disease)    Hypertension    Joint pain    Low HDL (under 40) 10/05/2019   Neck pain 07/16/2019   Obesity    Osteoarthritis    Rheumatoid arthritis (HCC)    Sciatica    Shortness of breath    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   MRI (06/11/2016)  T11-T12: degenerative disc bulge and facet hypertrophy w/o significant stenosis  L4-L5: mild b/l facet arthrosis  L5-S1: mild b/l facet arthrosis, sm central disc protrusion w/o significant stenosis or frank neural impingement and mod b/l foraminal narrowing at L5-S1 related to disc bulge and facet disease, right worse than left     Vitamin D  deficiency 10/05/2019    Past obstetric history: OB History  Gravida Para Term Preterm AB Living  7 3 3  3 3   SAB IAB Ectopic Multiple Live Births   3   3    # Outcome Date GA Lbr Len/2nd Weight Sex Type Anes PTL Lv  7 Current           6 Term 11/02/17 [redacted]w[redacted]d 20:29 / 00:04 2925 g M Vag-Spont EPI  LIV  5 IAB 08/01/09          4 Term 05/19/09 [redacted]w[redacted]d  2778 g F Vag-Spont EPI N LIV  Complications: IUGR (intrauterine growth restriction) affecting care of mother, Chronic hypertension affecting pregnancy  3 Term 07/31/05 [redacted]w[redacted]d  2835 g F Vag-Spont EPI N LIV     Complications: Shoulder Dystocia, Preeclampsia  2 IAB           1 IAB             Past Surgical History: Past Surgical History:  Procedure Laterality Date   TOOTH EXTRACTION N/A 10/22/2013   Procedure: DENTAL EXTRACTIONS TEETH #1, 16, 17, 32;  Surgeon: Glendia CHRISTELLA Primrose, DDS;  Location: [facility] OR;  Service: Oral  Surgery;  Laterality: N/A;    Family History: Family History  Problem Relation Age of Onset   Hypertension Mother    Depression Mother    Anxiety disorder Mother    Obesity Mother    Hypertension Father    Diabetes Maternal Grandmother    Diabetes Paternal Grandmother    Stroke Paternal Grandmother     Social History: Social History   Tobacco Use   Smoking status: Former    Current packs/day: 0.00    Types: Cigarettes    Quit date: 01/29/2017    Years since quitting: 6.4   Smokeless tobacco: Never  Substance Use Topics   Alcohol use: No    Alcohol/week: 0.0 standard drinks of alcohol    Comment: rare   Drug use: No    Types: Marijuana    Comment: not since confirmed pregnancy    Allergies: No Known Allergies  Meds:  Medications Prior to Admission  Medication Sig Dispense Refill Last Dose/Taking   Blood Pressure Monitoring DEVI 1 each by Does not apply route once a week. 1 each 0 07/03/2023   aspirin  EC 81 MG tablet Take 2 tablets (162 mg total) by mouth daily. Take after 12 weeks for prevention of preeclampsia later in pregnancy (Patient not taking: Reported on 05/08/2023) 300 tablet 2 Unknown   cyclobenzaprine  (FLEXERIL ) 10 MG tablet Take 1 tablet (10 mg total) by mouth 3 (three) times daily as needed for muscle spasms. 90 tablet 0 Unknown   ferrous sulfate  325 (65 FE) MG EC tablet Take 1 tablet (325 mg total) by mouth every other day. 90 tablet 1 Unknown   labetalol  (NORMODYNE ) 200 MG tablet Take 2 tablets (400 mg total) by mouth 2 (two) times daily. 120 tablet 6 More than a month   metFORMIN  (GLUCOPHAGE ) 500 MG tablet Take 1 tablet (500 mg total) by mouth daily in the evening with largest meal. (Patient not taking: Reported on 03/10/2023) 30 tablet 0    metroNIDAZOLE  (FLAGYL ) 500 MG tablet Take 1 tablet (500 mg total) by mouth 2 (two) times daily. 14 tablet 0    NIFEdipine  (ADALAT  CC) 30 MG 24 hr tablet Take 2 tablets (60 mg total) by mouth daily. 30 tablet 6 More than a  month   Prenatal Vit-Fe Fumarate-FA (PREPLUS) 27-1 MG TABS Take 1 tablet by mouth daily. 30 tablet 13 Unknown    I have reviewed patient's Past Medical Hx, Surgical Hx, Family Hx, Social Hx, medications and allergies.   ROS:  Review of Systems Other systems negative  Physical Exam  Patient Vitals for the past 24 hrs:  BP Temp Temp src Pulse Resp SpO2 Height  07/03/23 1352 (!) 172/98 -- -- 89 -- -- --  07/03/23 1330 (!) 179/104 -- -- 91 -- 97 % --  07/03/23 1315 (!) 181/102 -- -- 96 -- 99 % --  07/03/23 1302 (!) 208/115 -- -- 82 -- 96 % --  07/03/23 1245 (!) 201/118 -- -- -- -- -- --  07/03/23 1233 (!) 207/120 97.9 F (36.6 C) Oral 84 16 95 % 5' 7 (1.702 m)   Constitutional: Well-developed, well-nourished female in no acute distress.  Cardiovascular: normal rate and rhythm Respiratory: normal effort, clear to auscultation bilaterally GI: Abd soft, non-tender, gravid appropriate for gestational age.   No rebound or guarding. MS: Extremities nontender, no edema, normal ROM Neurologic: Alert and oriented x 4.  GU: Neg CVAT.    FHT:  Baseline 150, moderate variability, accelerations present, no decelerations Contractions: Irritability   Labs: Results for orders placed or performed during the hospital encounter of 07/03/23 (from the past 24 hours)  CBC     Status: Abnormal   Collection Time: 07/03/23 12:51 PM  Result Value Ref Range   WBC 10.7 (H) 4.0 - 10.5 K/uL   RBC 4.00 3.87 - 5.11 MIL/uL   Hemoglobin 10.8 (L) 12.0 - 15.0 g/dL   HCT 66.2 (L) 63.9 - 53.9 %   MCV 84.3 80.0 - 100.0 fL   MCH 27.0 26.0 - 34.0 pg   MCHC 32.0 30.0 - 36.0 g/dL   RDW 84.8 88.4 - 84.4 %   Platelets 264 150 - 400 K/uL   nRBC 0.9 (H) 0.0 - 0.2 %  Comprehensive metabolic panel     Status: Abnormal   Collection Time: 07/03/23 12:51 PM  Result Value Ref Range   Sodium 135 135 - 145 mmol/L   Potassium 3.4 (L) 3.5 - 5.1 mmol/L   Chloride 101 98 - 111 mmol/L   CO2 23 22 - 32 mmol/L   Glucose, Bld  104 (H) 70 - 99 mg/dL   BUN 11 6 - 20 mg/dL   Creatinine, Ser 8.97 (H) 0.44 - 1.00 mg/dL   Calcium  8.5 (L) 8.9 - 10.3 mg/dL   Total Protein 6.3 (L) 6.5 - 8.1 g/dL   Albumin 1.9 (L) 3.5 - 5.0 g/dL   AST 14 (L) 15 - 41 U/L   ALT 8 0 - 44 U/L   Alkaline Phosphatase 111 38 - 126 U/L   Total Bilirubin 0.4 0.0 - 1.2 mg/dL   GFR, Estimated >39 >39 mL/min   Anion gap 11 5 - 15  Protein / creatinine ratio, urine     Status: Abnormal   Collection Time: 07/03/23 12:51 PM  Result Value Ref Range   Creatinine, Urine 112 mg/dL   Total Protein, Urine 322 mg/dL   Protein Creatinine Ratio 2.88 (H) 0.00 - 0.15 mg/mg[Cre]   O/Positive/-- (09/11 1042)  Imaging:  No results found.  MAU Course/MDM: I have reviewed the triage vital signs and the nursing notes.   Pertinent labs & imaging results that were available during my care of the patient were reviewed by me and considered in my medical decision making (see chart for details).      I have reviewed her medical records including past results, notes and treatments.   I have ordered labs and reviewed results.  NST reviewed  Treatments in MAU included CBC, CMP, Urine pro/cr, Serial BP, Labetalol , Nifedipine .    Assessment: 1. [redacted] weeks gestation of pregnancy   2. Essential hypertension   Severe symptomatic hypertension due to medication noncompliance for the last several days - Hydralazine  protocol, no BP improvement.  - Labetalol  protocol, minimal BP improvement.  - Provided home labetalol  400mg , and Nifedipine  30mg    Headache - Trial tylenol  and flexeril   Plan: Admit to [facility] for BP management and determination  of appropriate home regimen.  Mardy Shropshire, MD FMOB Fellow, Faculty practice [facility], Center for [facility]  07/03/2023 2:35 PM

## 2023-07-03 NOTE — H&P (Signed)
 OBSTETRIC ADMISSION HISTORY AND PHYSICAL  Shirley [name] is a 33 y.o. female [phone] with IUP at [redacted]w[redacted]d by LMP presenting for severe CHTN refractory to IV antihypertensive. Pt unable to afford and acquire PO antihypertensives. Associated right sided headache and nausea on presentation today. She reports +FMs, No LOF, no VB, no blurry vision, headaches or peripheral edema, and RUQ pain.    She received her prenatal care at [facility]   Dating: By LMP --->  Estimated Date of Delivery: 10/02/23  Sono:   @[redacted]w[redacted]d , CWD, normal anatomy, cephalic presentation, posterior placental lie, 572 gm 1 lb 4 oz 68 %   Prenatal History/Complications:  - Chronic severe HTN - BMI 50 - Hx of Preeclampsia; requiring induction  at 36 weeks in 2019 - Transportation insecurity - Financial insecurity  Past Medical History: Past Medical History:  Diagnosis Date   Anxiety and depression 10/05/2019   Arthritis    Assistance needed with transportation 11/22/2019   Chest pain    Chronic low back pain 01/28/2018   Constipation    Depression    Disorder of hip joint 02/16/2018   Edema, lower extremity    Elevated alkaline phosphatase level 10/05/2019   GERD (gastroesophageal reflux disease)    Hypertension    Joint pain    Low HDL (under 40) 10/05/2019   Neck pain 07/16/2019   Obesity    Osteoarthritis    Rheumatoid arthritis (HCC)    Sciatica    Shortness of breath    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   MRI (06/11/2016)  T11-T12: degenerative disc bulge and facet hypertrophy w/o significant stenosis  L4-L5: mild b/l facet arthrosis  L5-S1: mild b/l facet arthrosis, sm central disc protrusion w/o significant stenosis or frank neural impingement and mod b/l foraminal narrowing at L5-S1 related to disc bulge and facet disease, right worse than left     Vitamin D  deficiency 10/05/2019    Past Surgical History: Past Surgical History:  Procedure Laterality Date   TOOTH EXTRACTION N/A  10/22/2013   Procedure: DENTAL EXTRACTIONS TEETH #1, 16, 17, 32;  Surgeon: Glendia CHRISTELLA Primrose, DDS;  Location: [facility] OR;  Service: Oral Surgery;  Laterality: N/A;    Obstetrical History: OB History     Gravida  7   Para  3   Term  3   Preterm      AB  3   Living  3      SAB      IAB  3   Ectopic      Multiple      Live Births  3           Social History Social History   Socioeconomic History   Marital status: Single    Spouse name: Not on file   Number of children: 3   Years of education: Not on file   Highest education level: Not on file  Occupational History   Occupation: stay at home mom  Tobacco Use   Smoking status: Former    Current packs/day: 0.00    Types: Cigarettes    Quit date: 01/29/2017    Years since quitting: 6.4   Smokeless tobacco: Never  Substance and Sexual Activity   Alcohol use: No    Alcohol/week: 0.0 standard drinks of alcohol    Comment: rare   Drug use: No    Types: Marijuana    Comment: not since confirmed pregnancy   Sexual activity: Yes    Birth control/protection:  None  Other Topics Concern   Not on file  Social History Narrative   Not on file   Social Drivers of Health   Financial Resource Strain: Not on file  Food Insecurity: Food Insecurity Present (03/11/2023)   Hunger Vital Sign    Worried About Running Out of Food in the Last Year: Sometimes true    Ran Out of Food in the Last Year: Sometimes true  Transportation Needs: No Transportation Needs (03/11/2023)   PRAPARE - Administrator, Civil Service (Medical): No    Lack of Transportation (Non-Medical): No  Physical Activity: Not on file  Stress: Not on file  Social Connections: Not on file    Family History: Family History  Problem Relation Age of Onset   Hypertension Mother    Depression Mother    Anxiety disorder Mother    Obesity Mother    Hypertension Father    Diabetes Maternal Grandmother    Diabetes Paternal Grandmother    Stroke  Paternal Grandmother     Allergies: No Known Allergies  Medications Prior to Admission  Medication Sig Dispense Refill Last Dose/Taking   Blood Pressure Monitoring DEVI 1 each by Does not apply route once a week. 1 each 0 07/03/2023   aspirin  EC 81 MG tablet Take 2 tablets (162 mg total) by mouth daily. Take after 12 weeks for prevention of preeclampsia later in pregnancy (Patient not taking: Reported on 05/08/2023) 300 tablet 2 Unknown   cyclobenzaprine  (FLEXERIL ) 10 MG tablet Take 1 tablet (10 mg total) by mouth 3 (three) times daily as needed for muscle spasms. 90 tablet 0 Unknown   ferrous sulfate  325 (65 FE) MG EC tablet Take 1 tablet (325 mg total) by mouth every other day. 90 tablet 1 Unknown   labetalol  (NORMODYNE ) 200 MG tablet Take 2 tablets (400 mg total) by mouth 2 (two) times daily. 120 tablet 6 More than a month   metFORMIN  (GLUCOPHAGE ) 500 MG tablet Take 1 tablet (500 mg total) by mouth daily in the evening with largest meal. (Patient not taking: Reported on 03/10/2023) 30 tablet 0    metroNIDAZOLE  (FLAGYL ) 500 MG tablet Take 1 tablet (500 mg total) by mouth 2 (two) times daily. 14 tablet 0    NIFEdipine  (ADALAT  CC) 30 MG 24 hr tablet Take 2 tablets (60 mg total) by mouth daily. 30 tablet 6 More than a month   Prenatal Vit-Fe Fumarate-FA (PREPLUS) 27-1 MG TABS Take 1 tablet by mouth daily. 30 tablet 13 Unknown     Review of Systems   All systems reviewed and negative except as stated in HPI  Blood pressure (!) 170/97, pulse 74, temperature 97.9 F (36.6 C), temperature source Oral, resp. rate 16, height 5' 7 (1.702 m), last menstrual period 12/26/2022, SpO2 96%, unknown if currently breastfeeding. General appearance: alert, cooperative, appears stated age, and moderate distress,  Eyes: PERRLA, mild periorbital edema Lungs: clear to auscultation bilaterally Heart: regular rate and rhythm Abdomen: soft, non-tender; bowel sounds normal Extremities: Homans sign is negative, no  sign of DVT DTR's 2+ Presentation: cephalic Fetal monitoringBaseline: 145 bpm, Variability: Good {> 6 bpm), Accelerations: Reactive, and Decelerations: Absent Uterine activityNone    Prenatal labs: ABO, Rh: --/--/PENDING (01/02 1248) Antibody: PENDING (01/02 1248) Rubella: 1.36 (09/11 1042) RPR: Non Reactive (09/11 1042)  HBsAg: Negative (09/11 1042)  HIV: Non Reactive (09/11 1042)  GBS:   unknown 2 hr Glucola not yet completed; A1C 5.6 Genetic screening CF carrier, Alpha thalassemia silent carrier  Anatomy US  normal  Prenatal Transfer Tool  Maternal Diabetes: Unknown Genetic Screening: CF carrier, Alpha thalassemia silent carrier Maternal Ultrasounds/Referrals: Normal Fetal Ultrasounds or other Referrals:  None Maternal Substance Abuse:  No Significant Maternal Medications:  None Significant Maternal Lab Results:  None Number of Prenatal Visits:Less than or equal to 3 verified prenatal visits Other Comments:  None  Results for orders placed or performed during the hospital encounter of 07/03/23 (from the past 24 hours)  Type and screen [facility]   Collection Time: 07/03/23 12:48 PM  Result Value Ref Range   ABO/RH(D) PENDING    Antibody Screen PENDING    Sample Expiration      07/06/2023,2359 Performed at [facility], [address] [place], [place] [address]   CBC   Collection Time: 07/03/23 12:51 PM  Result Value Ref Range   WBC 10.7 (H) 4.0 - 10.5 K/uL   RBC 4.00 3.87 - 5.11 MIL/uL   Hemoglobin 10.8 (L) 12.0 - 15.0 g/dL   HCT 66.2 (L) 63.9 - 53.9 %   MCV 84.3 80.0 - 100.0 fL   MCH 27.0 26.0 - 34.0 pg   MCHC 32.0 30.0 - 36.0 g/dL   RDW 84.8 88.4 - 84.4 %   Platelets 264 150 - 400 K/uL   nRBC 0.9 (H) 0.0 - 0.2 %  Comprehensive metabolic panel   Collection Time: 07/03/23 12:51 PM  Result Value Ref Range   Sodium 135 135 - 145 mmol/L   Potassium 3.4 (L) 3.5 - 5.1 mmol/L   Chloride 101 98 - 111 mmol/L   CO2 23 22 - 32 mmol/L    Glucose, Bld 104 (H) 70 - 99 mg/dL   BUN 11 6 - 20 mg/dL   Creatinine, Ser 8.97 (H) 0.44 - 1.00 mg/dL   Calcium  8.5 (L) 8.9 - 10.3 mg/dL   Total Protein 6.3 (L) 6.5 - 8.1 g/dL   Albumin 1.9 (L) 3.5 - 5.0 g/dL   AST 14 (L) 15 - 41 U/L   ALT 8 0 - 44 U/L   Alkaline Phosphatase 111 38 - 126 U/L   Total Bilirubin 0.4 0.0 - 1.2 mg/dL   GFR, Estimated >39 >39 mL/min   Anion gap 11 5 - 15  Protein / creatinine ratio, urine   Collection Time: 07/03/23 12:51 PM  Result Value Ref Range   Creatinine, Urine 112 mg/dL   Total Protein, Urine 322 mg/dL   Protein Creatinine Ratio 2.88 (H) 0.00 - 0.15 mg/mg[Cre]    Patient Active Problem List   Diagnosis Date Noted   Hypertensive emergency 07/03/2023   Hx of preeclampsia, prior pregnancy, currently pregnant, second trimester 04/07/2023   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   Alpha thalassemia silent carrier 03/24/2023   Iron  deficiency anemia 03/14/2023   Elevated hemoglobin A1c 03/14/2023   Supervision of high risk pregnancy, antepartum 02/27/2023   Chronic hypertension affecting pregnancy 10/27/2017   Osteoarthritis of both knees 09/29/2015   Essential hypertension    Class 3 severe obesity with serious comorbidity and body mass index (BMI) of 50.0 to 59.9 in adult [facility]) 12/02/2006   Primary hypertension 08/28/2006    Assessment/Plan:  ELWANDA MOGER is a 33 y.o. [id number] at [redacted]w[redacted]d here for poorly controlled CHTN   Admit to [facility] care  Determine appropriate home antihypertensive regimen - pt noncompliant due to financial and transportation insecurity.  - s/p PO 400mg  Labetalol ; PO 30mg  Nifedipine ; IV 5 & 10 hydralazine ; IV Labetalol  20mg , 40mg , 80mg   x2 - Trial 400mg  Labetalol  TID - IV Labetalol  80mg  available for hypertensive emergency 180/120 q61mins PRN  Headache without focal neurologic deficits; unilateral right-sided - minimal improvement with Tylenol  1000mg  and 10mg  Flexeril  - trial 2mg  morphine  and zofran   Growth US   tomorrow  Mardy Shropshire, MD  07/03/2023, 3:22 PM

## 2023-07-03 NOTE — MAU Note (Signed)
.  Alicea M Guidroz is a 33 y.o. at [redacted]w[redacted]d here in MAU reporting: arrival by EMS reporting severe range Bps at home and a headache that started yesterday. Denies visual changes and epigastric pain. Denies VB and LOF. +FM  Onset of complaint: 3 days ago Pain score: 8/10 Vitals:   07/03/23 1233 07/03/23 1245  BP: (!) 207/120 (!) 201/118  Pulse: 84   Resp: 16   Temp: 97.9 F (36.6 C)   SpO2: 95%      FHT:145 Lab orders placed from triage: urine obtained in triage

## 2023-07-04 ENCOUNTER — Ambulatory Visit: Payer: Medicaid Other

## 2023-07-04 ENCOUNTER — Inpatient Hospital Stay (HOSPITAL_COMMUNITY): Payer: Medicaid Other

## 2023-07-04 DIAGNOSIS — O26892 Other specified pregnancy related conditions, second trimester: Secondary | ICD-10-CM

## 2023-07-04 DIAGNOSIS — D563 Thalassemia minor: Secondary | ICD-10-CM

## 2023-07-04 DIAGNOSIS — Z3A27 27 weeks gestation of pregnancy: Secondary | ICD-10-CM

## 2023-07-04 DIAGNOSIS — O09292 Supervision of pregnancy with other poor reproductive or obstetric history, second trimester: Secondary | ICD-10-CM

## 2023-07-04 DIAGNOSIS — O10012 Pre-existing essential hypertension complicating pregnancy, second trimester: Secondary | ICD-10-CM

## 2023-07-04 DIAGNOSIS — Z141 Cystic fibrosis carrier: Secondary | ICD-10-CM

## 2023-07-04 DIAGNOSIS — O99212 Obesity complicating pregnancy, second trimester: Secondary | ICD-10-CM | POA: Diagnosis not present

## 2023-07-04 DIAGNOSIS — E669 Obesity, unspecified: Secondary | ICD-10-CM | POA: Diagnosis not present

## 2023-07-04 DIAGNOSIS — O99012 Anemia complicating pregnancy, second trimester: Secondary | ICD-10-CM

## 2023-07-04 MED ORDER — NIFEDIPINE ER OSMOTIC RELEASE 30 MG PO TB24
30.0000 mg | ORAL_TABLET | Freq: Two times a day (BID) | ORAL | Status: DC
  Administered 2023-07-04 – 2023-07-08 (×10): 30 mg via ORAL
  Filled 2023-07-04 (×10): qty 1

## 2023-07-04 NOTE — Progress Notes (Signed)
 FACULTY PRACTICE ANTEPARTUM PROGRESS NOTE  Shirley [name] is a 33 y.o. [phone] at [redacted]w[redacted]d who is admitted for uncontrolled chronic hypertension, possible superimposed.  Estimated Date of Delivery: 10/02/23 Fetal presentation is  unknown .  Length of Stay:  1 Days. Admitted 07/03/2023  Subjective: Pt seen and feels better this AM.  She denies current headache, visual changes and RUQ pain.   Patient reports normal fetal movement.  She denies uterine contractions, denies bleeding and leaking of fluid per vagina.  Vitals:  Blood pressure (!) 156/91, pulse 77, temperature 97.6 F (36.4 C), temperature source Oral, resp. rate 20, height 5' 7 (1.702 m), last menstrual period 12/26/2022, SpO2 95%, unknown if currently breastfeeding. Physical Examination: CONSTITUTIONAL: Well-developed, well-nourished female in no acute distress.  HENT:  Normocephalic, atraumatic, External right and left ear normal. Oropharynx is clear and moist EYES: Conjunctivae and EOM are normal.  NECK: Normal range of motion, supple, no masses. SKIN: Skin is warm and dry. No rash noted. Not diaphoretic. No erythema. No pallor. NEUROLGIC: Alert and oriented to person, place, and time. Normal reflexes, muscle tone coordination. No cranial nerve deficit noted.  DTR at biceps tendon +1 PSYCHIATRIC: Normal mood and affect. Normal behavior. Normal judgment and thought content. CARDIOVASCULAR: Normal heart rate noted, regular rhythm RESPIRATORY: Effort and breath sounds normal, no problems with respiration noted MUSCULOSKELETAL: Normal range of motion. No edema and no tenderness. ABDOMEN: Soft, nontender, nondistended, gravid. CERVIX: deferred  Fetal monitoring: FHR: 140s bpm, Variability: moderate, Accelerations: Present, Decelerations: occasional variable, occasional prolonged , 2-3 minute, decel with good variability throughout Uterine activity:none  Results for orders placed or performed during the hospital encounter of 07/03/23  (from the past 48 hours)  Type and screen [facility]     Status: None   Collection Time: 07/03/23 12:48 PM  Result Value Ref Range   ABO/RH(D) O POS    Antibody Screen NEG    Sample Expiration      07/06/2023,2359 Performed at [facility], [address], [place], [place] [address]   CBC     Status: Abnormal   Collection Time: 07/03/23 12:51 PM  Result Value Ref Range   WBC 10.7 (H) 4.0 - 10.5 K/uL   RBC 4.00 3.87 - 5.11 MIL/uL   Hemoglobin 10.8 (L) 12.0 - 15.0 g/dL   HCT 66.2 (L) 63.9 - 53.9 %   MCV 84.3 80.0 - 100.0 fL   MCH 27.0 26.0 - 34.0 pg   MCHC 32.0 30.0 - 36.0 g/dL   RDW 84.8 88.4 - 84.4 %   Platelets 264 150 - 400 K/uL   nRBC 0.9 (H) 0.0 - 0.2 %    Comment: Performed at [facility], [address], [place], [place] [address]  Comprehensive metabolic panel     Status: Abnormal   Collection Time: 07/03/23 12:51 PM  Result Value Ref Range   Sodium 135 135 - 145 mmol/L   Potassium 3.4 (L) 3.5 - 5.1 mmol/L   Chloride 101 98 - 111 mmol/L   CO2 23 22 - 32 mmol/L   Glucose, Bld 104 (H) 70 - 99 mg/dL    Comment: Glucose reference range applies only to samples taken after fasting for at least 8 hours.   BUN 11 6 - 20 mg/dL   Creatinine, Ser 8.97 (H) 0.44 - 1.00 mg/dL   Calcium  8.5 (L) 8.9 - 10.3 mg/dL   Total Protein 6.3 (L) 6.5 - 8.1 g/dL   Albumin 1.9 (  L) 3.5 - 5.0 g/dL   AST 14 (L) 15 - 41 U/L   ALT 8 0 - 44 U/L   Alkaline Phosphatase 111 38 - 126 U/L   Total Bilirubin 0.4 0.0 - 1.2 mg/dL   GFR, Estimated >39 >39 mL/min    Comment: (NOTE) Calculated using the CKD-EPI Creatinine Equation (2021)    Anion gap 11 5 - 15    Comment: Performed at [facility], [address] [place], [place] [address]  Protein / creatinine ratio, urine     Status: Abnormal   Collection Time: 07/03/23 12:51 PM  Result Value Ref Range   Creatinine, Urine 112 mg/dL   Total Protein, Urine 322 mg/dL    Comment: RESULTS CONFIRMED BY MANUAL  DILUTION NO NORMAL RANGE ESTABLISHED FOR THIS TEST    Protein Creatinine Ratio 2.88 (H) 0.00 - 0.15 mg/mg[Cre]    Comment: Performed at [facility], [address], [place], [place] [address]    I have reviewed the patient's current medications.  ASSESSMENT: Principal Problem:   Hypertensive emergency   PLAN: Long discussion with patient regarding the need for her blood pressure medicines as well as baby ASA. BP much improved on current regimen. Pt not currently on magnesium  sulfate.  Pt is s/p BMZ x 1, will repeat this PM. Concern for possible superimposed preeclampsia due to elevated P/C ratio. Continue to monitor blood pressures. NST BID Growth scan today   Continue routine antenatal care.   Jerilynn Buddle, MD [facility] Faculty Attending, [facility] 07/04/2023 8:45 AM

## 2023-07-04 NOTE — Clinical SW OB High Risk (Signed)
 OB Specialty Care  Clinical Social Worker:  Nat DELENA Quiet, [place] Date/Time:  07/04/2023, 12:02 PM Gestational Age on Admission:  33 y.o. Admitting Diagnosis:  uncontrolled chronic hypertension    Expected Delivery Date:  10/02/23.  Family/Home Environment  Home Address:  [address] [place], [place] [address]  Household Member/Support Name:   Relationship:  Children (65yr, 28yr, 66yr) Other Support:  Mother, FOB ( [employer])   Psychosocial Data  Employment:  Homemaker  Type of Work:    Education: 9 to 11 years   Cultural/Environment Issues Impacting Care:     Strengths/Weaknesses/Factors to Consider  Concerns Related to Hospitalization:  MOB unable to obtain medication due to financial and transportation insecurity.     Previous Pregnancies/Feelings Towards Pregnancy?  Concerns related to being/becoming a mother?:  MOB reported that she is very happy about the pregnancy.   Social Support (FOB? Who is/will be helping with baby/other kids?): Spouse/significant other, Immediate Family    Recent Stressful Life Events (life changes in past year?):  MOB reported for the past month she has been stressed and worried about her health. MOB explained that her blood pressure numbers have been high. She reported that her heart was beating really fast which she described as new symptoms and yesterday called EMS after she had difficulty breathing.   CSW noted history of mental health for anxiety and depression. MOB denied mental health concerns besides her feeling worried about her health.    Prenatal Care/Education/Home Preparations: MOB has established prenatal care at [facility]. MOB reported that she will be able to get some infant supplies. CSW informed MOB that social work can make a community referral to assist with infant supplies when she gives birth. CSW asked if MOB received WIC and food stamps. MOB reported that she receives food and will  eventually apply for [facility].    Domestic Violence (of any type):  No If Yes to Domestic Violence, Describe/Action Plan:  N/A   Substance Use During Pregnancy: No   Clinical Assessment/Plan:  CSW met with MOB at the infant's bedside and introduced CSW role. CSW explained the reason for the visit. MOB was receptive. CSW inquired how MOB had been feeling. MOB reported that she feels a little better. CSW inquired about MOB financial and transportation concerns. MOB confirmed that her Medicaid is active and shared financial constraints to affording her eight-dollar co-pay.  MOB reported that she does not have reliable transportation.   CSW educated MOB about Medicaid transportation and provided resources for her to follow up. MOB reported that she would follow up. CSW reached out to [facility] RNCM Everlina S.) about patient's need for medication assistance. RNCM reported that the attending physician will need to send the patient's medication to the [facility]. CSW unable to reach physician by secure chat. CSW notified patient's RN. RN to notify the physician.   Nat Quiet, MSW, LCSW Clinical Social Worker  [phone] 07/04/2023  1:26 PM

## 2023-07-05 DIAGNOSIS — Z3A27 27 weeks gestation of pregnancy: Secondary | ICD-10-CM

## 2023-07-05 MED ORDER — BISACODYL 10 MG RE SUPP: 10.0000 mg | Freq: Two times a day (BID) | RECTAL | Status: DC | PRN

## 2023-07-05 MED ORDER — LABETALOL HCL 200 MG PO TABS
400.0000 mg | ORAL_TABLET | Freq: Three times a day (TID) | ORAL | Status: DC
  Administered 2023-07-05 – 2023-07-06 (×2): 400 mg via ORAL
  Filled 2023-07-05 (×2): qty 2

## 2023-07-05 MED ORDER — POLYETHYLENE GLYCOL 3350 17 G PO PACK
17.0000 g | PACK | Freq: Two times a day (BID) | ORAL | Status: DC
  Administered 2023-07-05 – 2023-07-06 (×3): 17 g via ORAL
  Filled 2023-07-05 (×6): qty 1

## 2023-07-05 NOTE — Progress Notes (Signed)
 FACULTY PRACTICE ANTEPARTUM(COMPREHENSIVE) NOTE  Shirley [name] is a 33 y.o. [phone] with Estimated Date of Delivery: 10/02/23   By  early ultrasound [redacted]w[redacted]d  who is admitted for cHTN with superimpoased pre eclampsia(protein-->P/Cr 9/9 53, now 2880).    Fetal presentation is cephalic on 07/04/23 sonogram Length of Stay:  2  Days  Date of admission:07/03/2023  Subjective: No complaints Patient reports the fetal movement as active. Patient reports uterine contraction  activity as none. Patient reports  vaginal bleeding as none. Patient describes fluid per vagina as None.  Vitals:  Blood pressure (!) 150/82, pulse 79, temperature 98 F (36.7 C), temperature source Oral, resp. rate (!) 22, height 5' 7 (1.702 m), last menstrual period 12/26/2022, SpO2 95%, unknown if currently breastfeeding. Vitals:   07/04/23 2336 07/05/23 0401 07/05/23 0502 07/05/23 0725  BP:  (!) 160/83 (!) 152/97 (!) 150/82  Pulse: 75 80 78 79  Resp: 18 19  (!) 22  Temp: 98.3 F (36.8 C) 99.1 F (37.3 C)  98 F (36.7 C)  TempSrc: Oral Oral  Oral  SpO2: 95% 97%  95%  Height:       Physical Examination:  General appearance - alert, well appearing, and in no distress Abdomen - soft, nontender, nondistended, no masses or organomegaly Fundal Height:  size equals dates Pelvic Exam:  examination not indicated Cervical Exam: Not evaluated. . Extremities: extremities normal, atraumatic, no cyanosis or edema with DTRs 2+ bilaterally Membranes:intact  Fetal Monitoring:  Baseline: 140 bpm, Variability: Good {> 6 bpm), Accelerations: Reactive, and Decelerations: Absent   reactive  Labs:  No results found for this or any previous visit (from the past 24 hours).  Imaging Studies:    US  MFM OB FOLLOW UP Result Date: 07/04/2023 ----------------------------------------------------------------------  OBSTETRICS REPORT                       (Signed Final 07/04/2023 09:40 pm)  ---------------------------------------------------------------------- Patient Info  ID #:       [id number]                          D.O.B.:  1990-10-03 (32 yrs)(F)  Name:       Shirley [name]                Visit Date: 07/04/2023 09:41 am ---------------------------------------------------------------------- Performed By  Attending:        Steffan Keys MD         Ref. Address:      [address]                                                              [place], [place]                                                              [address]  Performed By:     Heron Gay        Secondary Phy.:    [facility]                                                              Care  Referred By:      [facility]          [facility] ---------------------------------------------------------------------- Orders  #  Description                           Code        Ordered By  1  US  MFM OB FOLLOW UP                   [id number]    MARDY SHROPSHIRE ----------------------------------------------------------------------  #  Order #                     Accession #                Episode #  1  [id number]                   [id number]                 [id number] ---------------------------------------------------------------------- Indications  Hypertension - Chronic/Pre-existing             O10.019  (labetalol  and nifedipine )  Obesity complicating pregnancy, second          O99.212  trimester  [redacted] weeks gestation of pregnancy                 Z3A.27  Poor obstetric history: Previous preeclampsia   O09.299  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, 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)  Low risk NIPS ---------------------------------------------------------------------- Fetal Evaluation  Num Of Fetuses:          1  Fetal Heart Rate(bpm):   150  Cardiac Activity:        Observed  Presentation:            Cephalic  Placenta:                 Posterior  Amniotic Fluid  AFI FV:      Within normal limits  AFI Sum(cm)     %Tile       Largest Pocket(cm)  14.8            51          5.6  RUQ(cm)       RLQ(cm)       LUQ(cm)        LLQ(cm)  3.7           5.6           1.6            3.9 ---------------------------------------------------------------------- Biometry  BPD:      67.5  mm     G. Age:  27w 1d         40  %    CI:          74.3  %    70 - 86                                                          FL/HC:       20.4  %    18.6 - 20.4  HC:      248.6  mm     G. Age:  27w 0d         18  %    HC/AC:       1.10       1.05 - 1.21  AC:      226.7  mm     G. Age:  27w 0d         39  %    FL/BPD:      75.1  %    71 - 87  FL:       50.7  mm     G. Age:  27w 1d         36  %    FL/AC:       22.4  %    20 - 24  Est. FW:    1027   gm     2 lb 4 oz     36  % ---------------------------------------------------------------------- OB History  Blood Type:   O+  Gravidity:    7         Term:   3  TOP:          3        Living:  3 ---------------------------------------------------------------------- Gestational Age  LMP:           27w 1d        Date:  12/26/22                  EDD:   10/02/23  U/S Today:     27w 1d                                        EDD:   10/02/23  Best:          27w 1d     Det. By:  LMP  (12/26/22)          EDD:   10/02/23 ---------------------------------------------------------------------- Anatomy  Heart:                 Appears normal         Kidneys:                Appear normal                         (4CH, axis, and                         situs)  Diaphragm:             Appears normal         Bladder:  Appears normal  Stomach:               Appears normal, left                         sided ---------------------------------------------------------------------- Comments  This patient was admitted for blood pressure control.  She  has a history of chronic hypertension and was noncompliant  with medication.  The overall EFW  obtained today measured 2 pounds 4  ounces (36 percentile).  There was normal amniotic fluid noted with a total AFI of 14.8  cm.  The fetus is in the vertex presentation. ----------------------------------------------------------------------                  Steffan Keys, MD Electronically Signed Final Report   07/04/2023 09:40 pm ----------------------------------------------------------------------   DG Chest 2 View Result Date: 07/03/2023 CLINICAL DATA:  Pregnancy related hypertension EXAM: CHEST - 2 VIEW COMPARISON:  03/01/2023 FINDINGS: Cardiac shadow is enlarged. Lungs are well aerated bilaterally. No focal infiltrate or effusion is seen. No bony abnormality is noted. IMPRESSION: Stable cardiomegaly. Electronically Signed   By: Oneil Devonshire M.D.   On: 07/03/2023 20:14     Medications:  Scheduled  aspirin  EC  162 mg Oral Daily   docusate sodium   100 mg Oral Daily   ferrous sulfate   325 mg Oral QODAY   labetalol   400 mg Oral Q8H   NIFEdipine   30 mg Oral BID   polyethylene glycol  17 g Oral BID   prenatal multivitamin  1 tablet Oral Q1200   sodium chloride  flush  3-10 mL Intravenous Q12H   I have reviewed the patient's current medications.  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   Alpha thalassemia silent carrier 03/24/2023   Iron  deficiency anemia 03/14/2023   Elevated hemoglobin A1c 03/14/2023   Supervision of high risk pregnancy, antepartum 02/27/2023   Chronic hypertension affecting pregnancy 10/27/2017   Osteoarthritis of both knees 09/29/2015   Essential hypertension    Class 3 severe obesity with serious comorbidity and body mass index (BMI) of 50.0 to 59.9 in adult [facility]) 12/02/2006   Primary hypertension 08/28/2006    PLAN: >S/P BMZ x  2 >Growth: 36% >24 hour urine ordered this am >No magnesium  given yet >titrating BP meds to desired range  Vonn H Artavius Stearns 07/05/2023,10:58 AM

## 2023-07-06 ENCOUNTER — Encounter (HOSPITAL_COMMUNITY): Payer: Self-pay | Admitting: Obstetrics & Gynecology

## 2023-07-06 ENCOUNTER — Inpatient Hospital Stay (HOSPITAL_COMMUNITY): Payer: Medicaid Other

## 2023-07-06 DIAGNOSIS — R0600 Dyspnea, unspecified: Secondary | ICD-10-CM | POA: Diagnosis not present

## 2023-07-06 DIAGNOSIS — I517 Cardiomegaly: Secondary | ICD-10-CM

## 2023-07-06 DIAGNOSIS — I429 Cardiomyopathy, unspecified: Secondary | ICD-10-CM

## 2023-07-06 DIAGNOSIS — R918 Other nonspecific abnormal finding of lung field: Secondary | ICD-10-CM | POA: Diagnosis not present

## 2023-07-06 LAB — TYPE AND SCREEN
ABO/RH(D): O POS
Antibody Screen: NEGATIVE

## 2023-07-06 LAB — COMPREHENSIVE METABOLIC PANEL
ALT: 16 U/L (ref 0–44)
AST: 21 U/L (ref 15–41)
Albumin: 1.9 g/dL — ABNORMAL LOW (ref 3.5–5.0)
Alkaline Phosphatase: 103 U/L (ref 38–126)
Anion gap: 8 (ref 5–15)
BUN: 18 mg/dL (ref 6–20)
CO2: 20 mmol/L — ABNORMAL LOW (ref 22–32)
Calcium: 8.4 mg/dL — ABNORMAL LOW (ref 8.9–10.3)
Chloride: 106 mmol/L (ref 98–111)
Creatinine, Ser: 0.94 mg/dL (ref 0.44–1.00)
GFR, Estimated: >60 mL/min (ref >60)
Glucose, Bld: 92 mg/dL (ref 70–99)
Potassium: 3.7 mmol/L (ref 3.5–5.1)
Sodium: 134 mmol/L — ABNORMAL LOW (ref 135–145)
Total Bilirubin: 0.4 mg/dL (ref 0.0–1.2)
Total Protein: 5.7 g/dL — ABNORMAL LOW (ref 6.5–8.1)

## 2023-07-06 LAB — BRAIN NATRIURETIC PEPTIDE: B Natriuretic Peptide: 538.6 pg/mL — ABNORMAL HIGH (ref 0.0–100.0)

## 2023-07-06 LAB — ECHOCARDIOGRAM COMPLETE
AR max vel: 3.33 cm2
AV Peak grad: 7.7 mm[Hg]
Ao pk vel: 1.39 m/s
Area-P 1/2: 4.31 cm2
Height: 67 in
MV M vel: 4.69 m/s
MV Peak grad: 88.1 mm[Hg]
S' Lateral: 4 cm

## 2023-07-06 LAB — RESP PANEL BY RT-PCR (RSV, FLU A&B, COVID)  RVPGX2
Influenza A by PCR: NEGATIVE
Influenza B by PCR: NEGATIVE
Resp Syncytial Virus by PCR: NEGATIVE
SARS Coronavirus 2 by RT PCR: NEGATIVE

## 2023-07-06 LAB — CREATININE CLEARANCE, URINE, 24 HOUR
Collection Interval-CRCL: 24 h
Creatinine Clearance: 132 mL/min — ABNORMAL HIGH (ref 75–115)
Creatinine, 24H Ur: 1936 mg/d — ABNORMAL HIGH (ref 600–1800)
Creatinine, Urine: 121 mg/dL
Urine Total Volume-CRCL: 1600 mL

## 2023-07-06 LAB — CBC
HCT: 28.8 % — ABNORMAL LOW (ref 36.0–46.0)
Hemoglobin: 9.2 g/dL — ABNORMAL LOW (ref 12.0–15.0)
MCH: 27.5 pg (ref 26.0–34.0)
MCHC: 31.9 g/dL (ref 30.0–36.0)
MCV: 86 fL (ref 80.0–100.0)
Platelets: 221 10*3/uL (ref 150–400)
RBC: 3.35 MIL/uL — ABNORMAL LOW (ref 3.87–5.11)
RDW: 16.1 % — ABNORMAL HIGH (ref 11.5–15.5)
WBC: 15.1 10*3/uL — ABNORMAL HIGH (ref 4.0–10.5)
nRBC: 0.9 % — ABNORMAL HIGH (ref 0.0–0.2)

## 2023-07-06 LAB — TROPONIN I (HIGH SENSITIVITY)
Troponin I (High Sensitivity): 152 ng/L (ref <18)
Troponin I (High Sensitivity): 140 ng/L (ref <18)

## 2023-07-06 LAB — PROTEIN, URINE, 24 HOUR
Collection Interval-UPROT: 24 h
Protein, 24H Urine: 800 mg/d — ABNORMAL HIGH (ref 50–100)
Protein, Urine: 50 mg/dL
Urine Total Volume-UPROT: 1600 mL

## 2023-07-06 MED ORDER — SODIUM CHLORIDE 0.9 % IV SOLN
5.0000 10*6.[IU] | Freq: Once | INTRAVENOUS | Status: AC
  Administered 2023-07-07: 5 10*6.[IU] via INTRAVENOUS
  Filled 2023-07-06: qty 5

## 2023-07-06 MED ORDER — HYDRALAZINE HCL 50 MG PO TABS
50.0000 mg | ORAL_TABLET | Freq: Four times a day (QID) | ORAL | Status: DC
  Administered 2023-07-07 (×3): 50 mg via ORAL
  Filled 2023-07-06 (×5): qty 1

## 2023-07-06 MED ORDER — FLEET ENEMA RE ENEM: 1.0000 | ENEMA | RECTAL | Status: DC | PRN

## 2023-07-06 MED ORDER — OXYTOCIN BOLUS FROM INFUSION: 333.0000 mL | Freq: Once | INTRAVENOUS | Status: DC

## 2023-07-06 MED ORDER — SOD CITRATE-CITRIC ACID 500-334 MG/5ML PO SOLN
30.0000 mL | ORAL | Status: DC | PRN
  Filled 2023-07-06: qty 30

## 2023-07-06 MED ORDER — MISOPROSTOL 50MCG HALF TABLET
50.0000 ug | ORAL_TABLET | Freq: Once | ORAL | Status: AC
  Administered 2023-07-07: 50 ug via ORAL
  Filled 2023-07-06: qty 1

## 2023-07-06 MED ORDER — FENTANYL CITRATE (PF) 100 MCG/2ML IJ SOLN: 50.0000 ug | INTRAMUSCULAR | Status: DC | PRN

## 2023-07-06 MED ORDER — ONDANSETRON HCL 4 MG/2ML IJ SOLN: 4.0000 mg | Freq: Four times a day (QID) | INTRAMUSCULAR | Status: DC | PRN

## 2023-07-06 MED ORDER — LACTATED RINGERS IV SOLN: INTRAVENOUS | Status: DC

## 2023-07-06 MED ORDER — LABETALOL HCL 300 MG PO TABS
600.0000 mg | ORAL_TABLET | Freq: Three times a day (TID) | ORAL | Status: DC
  Administered 2023-07-06 – 2023-07-07 (×3): 600 mg via ORAL
  Filled 2023-07-06: qty 2
  Filled 2023-07-06 (×3): qty 3

## 2023-07-06 MED ORDER — PENICILLIN G POT IN DEXTROSE 60000 UNIT/ML IV SOLN
3.0000 10*6.[IU] | INTRAVENOUS | Status: DC
  Administered 2023-07-07 (×2): 3 10*6.[IU] via INTRAVENOUS
  Filled 2023-07-06 (×2): qty 50

## 2023-07-06 MED ORDER — LABETALOL HCL 5 MG/ML IV SOLN
40.0000 mg | INTRAVENOUS | Status: DC | PRN
  Administered 2023-07-07: 40 mg via INTRAVENOUS
  Filled 2023-07-06: qty 8

## 2023-07-06 MED ORDER — PANTOPRAZOLE SODIUM 40 MG PO TBEC
40.0000 mg | DELAYED_RELEASE_TABLET | Freq: Two times a day (BID) | ORAL | Status: DC
  Administered 2023-07-06 – 2023-07-10 (×9): 40 mg via ORAL
  Filled 2023-07-06 (×9): qty 1

## 2023-07-06 MED ORDER — OXYTOCIN-SODIUM CHLORIDE 30-0.9 UT/500ML-% IV SOLN
2.5000 [IU]/h | INTRAVENOUS | Status: DC
Start: 2023-07-06 — End: 2023-07-07

## 2023-07-06 MED ORDER — FUROSEMIDE 10 MG/ML IJ SOLN
40.0000 mg | Freq: Four times a day (QID) | INTRAMUSCULAR | Status: DC
  Administered 2023-07-06 – 2023-07-07 (×3): 40 mg via INTRAVENOUS
  Filled 2023-07-06 (×3): qty 4

## 2023-07-06 MED ORDER — MAGNESIUM SULFATE BOLUS VIA INFUSION
4.0000 g | Freq: Once | INTRAVENOUS | Status: AC
  Administered 2023-07-06: 4 g via INTRAVENOUS
  Filled 2023-07-06: qty 1000

## 2023-07-06 MED ORDER — MAGNESIUM SULFATE 40 GM/1000ML IV SOLN
1.0000 g/h | INTRAVENOUS | Status: DC
Start: 2023-07-06 — End: 2023-07-07
  Administered 2023-07-06: 1 g/h via INTRAVENOUS
  Filled 2023-07-06: qty 1000

## 2023-07-06 MED ORDER — HYDRALAZINE HCL 20 MG/ML IJ SOLN
5.0000 mg | INTRAMUSCULAR | Status: DC | PRN
  Administered 2023-07-06 – 2023-07-07 (×3): 5 mg via INTRAVENOUS
  Filled 2023-07-06 (×3): qty 1

## 2023-07-06 MED ORDER — MISOPROSTOL 25 MCG QUARTER TABLET
25.0000 ug | ORAL_TABLET | Freq: Once | ORAL | Status: AC
  Administered 2023-07-07: 25 ug via VAGINAL
  Filled 2023-07-06: qty 1

## 2023-07-06 MED ORDER — TERBUTALINE SULFATE 1 MG/ML IJ SOLN: 0.2500 mg | Freq: Once | INTRAMUSCULAR | Status: DC | PRN

## 2023-07-06 MED ORDER — LABETALOL HCL 5 MG/ML IV SOLN
20.0000 mg | INTRAVENOUS | Status: DC | PRN
  Administered 2023-07-06 – 2023-07-07 (×3): 20 mg via INTRAVENOUS
  Filled 2023-07-06 (×3): qty 4

## 2023-07-06 MED ORDER — LIDOCAINE HCL (PF) 1 % IJ SOLN: 30.0000 mL | INTRAMUSCULAR | Status: DC | PRN

## 2023-07-06 MED ORDER — LACTATED RINGERS IV SOLN: 500.0000 mL | INTRAVENOUS | Status: DC | PRN

## 2023-07-06 MED ORDER — HYDRALAZINE HCL 20 MG/ML IJ SOLN
10.0000 mg | INTRAMUSCULAR | Status: DC | PRN
  Administered 2023-07-06 – 2023-07-07 (×3): 10 mg via INTRAVENOUS

## 2023-07-06 NOTE — Progress Notes (Signed)
 OB note 1830 Called about patient re: SOB and new O2 requirement needing 3L. BPs upper mild range and I ordered blood and pCXR. Echocardiogram back, which I ordered early this afternoon after seeing that her 2v CXR on 1/2 showed cardiomegaly. Echo showed low normal LV function with 50-55% LVEF (see results below).  I went to see patient and pCXR just done. Patient endorses SOB but no chest pain, abdominal pain. In room, patient's O2 goes down to 93% when [place] is off but then back up to high 90s with 3L.   Patient laying in bed in NAD Normal s1 and s2, no MRGs CTAB, but limited by patient's body habitus Abdomen: obese nttp Ext: no SCDs in room  A/P: pt's status uncertain I told her that I'm concerned that her pre-eclampsia is getting worse and I'm also concerned that it may be getting to the point where delivery may be indicated. I ordered stat labs, follow up her pCXR and an ECG. Leave on O2 for now and touch base with cardiology once labs are back.  Patient does not have SCDs in room and is not on lovenox  prophylaxis. Follow up labs but she may need imaging to rule out chest PE as well.    She last had a full meal at 1800 and I told her to stay NPO  I also saw that NICU has not been notified of her and they are aware of her and will see her  Bebe Izell Raddle MD Attending [facility] [employer] [facility]Faculty Practice) GYN Consult Phone: [phone] (M-F, [phone]) & [phone] (Off hours, weekends, holidays)

## 2023-07-06 NOTE — Progress Notes (Signed)
 Echocardiogram 2D Echocardiogram has been performed.  Shirley [name] 07/06/2023, 5:54 PM

## 2023-07-06 NOTE — Progress Notes (Signed)
 Date and time results received: 07/06/23 2005 (use smartphrase ".now" to insert current time)  Test:Troponin Critical Value:152  Name of Provider Notified:Dr Pickens  Orders Received? Or Actions Taken?: No New Orders

## 2023-07-06 NOTE — Progress Notes (Signed)
 Patient ID: Shirley [name], female   DOB: [birth year], 33 y.o.   MRN: [id number]  FACULTY PRACTICE ANTEPARTUM(COMPREHENSIVE) NOTE  Shirley [name] is a 33 y.o. H2E6966 with Estimated Date of Delivery: 10/02/23   By  early ultrasound [redacted]w[redacted]d  who is admitted for cHTN with superimposed pre eclampsia management.    Fetal presentation is cephalic. Length of Stay:  3  Days  Date of admission:07/03/2023  Subjective: No current complaints Patient reports the fetal movement as active. Patient reports uterine contraction  activity as none. Patient reports  vaginal bleeding as none. Patient describes fluid per vagina as None.  Vitals:  Blood pressure (!) 142/72, pulse 79, temperature 97.6 F (36.4 C), temperature source Oral, resp. rate 20, height 5' 7 (1.702 m), last menstrual period 12/26/2022, SpO2 96%, unknown if currently breastfeeding. Vitals:   07/06/23 0617 07/06/23 0700 07/06/23 0733 07/06/23 0800  BP: (!) 155/98  (!) 142/72   Pulse: 87  79   Resp: 18  20   Temp: 98 F (36.7 C)  97.6 F (36.4 C)   TempSrc:   Oral   SpO2:  93% 94% 96%  Height:       Physical Examination:  General appearance - alert, well appearing, and in no distress Abdomen - soft, nontender, nondistended, no masses or organomegaly Fundal Height:  size equals dates Pelvic Exam:  examination not indicated Cervical Exam: Not evaluated.. Extremities: extremities normal, atraumatic, no cyanosis or edema with DTRs 2+ bilaterally Membranes:intact  Fetal Monitoring:  Baseline: 140s bpm, Variability: Good {> 6 bpm), Accelerations: Non-reactive but appropriate for gestational age, and Decelerations: Absent   reassuring  Labs:  No results found for this or any previous visit (from the past 24 hours).  Imaging Studies:    US  MFM OB FOLLOW UP Result Date: 07/04/2023 ----------------------------------------------------------------------  OBSTETRICS REPORT                       (Signed Final 07/04/2023 09:40 pm)  ---------------------------------------------------------------------- Patient Info  ID #:       [id number]                          D.O.B.:  1990-09-06 (32 yrs)(F)  Name:       Shirley [name]                Visit Date: 07/04/2023 09:41 am ---------------------------------------------------------------------- Performed By  Attending:        Steffan Keys MD         Ref. Address:      [address]                                                              [place], [place]                                                              [address]  Performed By:     Heron Gay        Secondary Phy.:    [facility]                                                              [facility]  Referred By:      [facility]          [facility] ---------------------------------------------------------------------- Orders  #  Description                           Code        Ordered By  1  US  MFM OB FOLLOW UP                   [id number]    MARDY SHROPSHIRE ----------------------------------------------------------------------  #  Order #                     Accession #                Episode #  1  [id number]                   [id number]                 [id number] ---------------------------------------------------------------------- Indications  Hypertension - Chronic/Pre-existing             O10.019  (labetalol  and nifedipine )  Obesity complicating pregnancy, second          O99.212  trimester  [redacted] weeks gestation of pregnancy                 Z3A.27  Poor obstetric history: Previous preeclampsia   O09.299  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, 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)  Low risk NIPS ---------------------------------------------------------------------- Fetal Evaluation  Num Of Fetuses:          1  Fetal Heart Rate(bpm):   150  Cardiac Activity:        Observed  Presentation:            Cephalic  Placenta:                 Posterior  Amniotic Fluid  AFI FV:      Within normal limits  AFI Sum(cm)     %Tile       Largest Pocket(cm)  14.8            51          5.6  RUQ(cm)       RLQ(cm)       LUQ(cm)        LLQ(cm)  3.7           5.6           1.6            3.9 ---------------------------------------------------------------------- Biometry  BPD:      67.5  mm     G. Age:  27w 1d         40  %    CI:          74.3  %    70 - 86                                                          FL/HC:       20.4  %    18.6 - 20.4  HC:      248.6  mm     G. Age:  27w 0d         18  %    HC/AC:       1.10       1.05 - 1.21  AC:      226.7  mm     G. Age:  27w 0d         39  %    FL/BPD:      75.1  %    71 - 87  FL:       50.7  mm     G. Age:  27w 1d         36  %    FL/AC:       22.4  %    20 - 24  Est. FW:    1027   gm     2 lb 4 oz     36  % ---------------------------------------------------------------------- OB History  Blood Type:   O+  Gravidity:    7         Term:   3  TOP:          3        Living:  3 ---------------------------------------------------------------------- Gestational Age  LMP:           27w 1d        Date:  12/26/22                  EDD:   10/02/23  U/S Today:     27w 1d                                        EDD:   10/02/23  Best:          27w 1d     Det. By:  LMP  (12/26/22)          EDD:   10/02/23 ---------------------------------------------------------------------- Anatomy  Heart:                 Appears normal         Kidneys:                Appear normal                         (4CH, axis, and                         situs)  Diaphragm:             Appears normal         Bladder:  Appears normal  Stomach:               Appears normal, left                         sided ---------------------------------------------------------------------- Comments  This patient was admitted for blood pressure control.  She  has a history of chronic hypertension and was noncompliant  with medication.  The overall EFW  obtained today measured 2 pounds 4  ounces (36 percentile).  There was normal amniotic fluid noted with a total AFI of 14.8  cm.  The fetus is in the vertex presentation. ----------------------------------------------------------------------                  Steffan Keys, MD Electronically Signed Final Report   07/04/2023 09:40 pm ----------------------------------------------------------------------   DG Chest 2 View Result Date: 07/03/2023 CLINICAL DATA:  Pregnancy related hypertension EXAM: CHEST - 2 VIEW COMPARISON:  03/01/2023 FINDINGS: Cardiac shadow is enlarged. Lungs are well aerated bilaterally. No focal infiltrate or effusion is seen. No bony abnormality is noted. IMPRESSION: Stable cardiomegaly. Electronically Signed   By: Oneil Devonshire M.D.   On: 07/03/2023 20:14     Medications:  Scheduled  aspirin  EC  162 mg Oral Daily   docusate sodium   100 mg Oral Daily   ferrous sulfate   325 mg Oral QODAY   labetalol   600 mg Oral Q8H   NIFEdipine   30 mg Oral BID   polyethylene glycol  17 g Oral BID   prenatal multivitamin  1 tablet Oral Q1200   sodium chloride  flush  3-10 mL Intravenous Q12H   I have reviewed the patient's current medications.  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   Alpha thalassemia silent carrier 03/24/2023   Iron  deficiency anemia 03/14/2023   Elevated hemoglobin A1c 03/14/2023   Supervision of high risk pregnancy, antepartum 02/27/2023   Chronic hypertension affecting pregnancy 10/27/2017   Osteoarthritis of both knees 09/29/2015   Essential hypertension    Class 3 severe obesity with serious comorbidity and body mass index (BMI) of 50.0 to 59.9 in adult [facility]) 12/02/2006   Primary hypertension 08/28/2006    PLAN: >S/P BMZ x  2 >dialing up BP meds to desired range >24 hour urine finishes this afternoon, should be back tomorrow am >will recheck labs in am  Evaluate for discharge in am if labs stable and BP in desired range   [employer] 07/06/2023,10:43 AM

## 2023-07-06 NOTE — Plan of Care (Signed)
  Problem: Education: Goal: Knowledge of disease or condition will improve Outcome: Progressing Goal: Knowledge of the prescribed therapeutic regimen will improve Outcome: Progressing   Problem: Fluid Volume: Goal: Peripheral tissue perfusion will improve Outcome: Progressing   Problem: Clinical Measurements: Goal: Complications related to disease process, condition or treatment will be avoided or minimized Outcome: Progressing   Problem: Education: Goal: Knowledge of disease or condition will improve Outcome: Progressing Goal: Knowledge of the prescribed therapeutic regimen will improve Outcome: Progressing Goal: Individualized Educational Video(s) Outcome: Progressing   Problem: Education: Goal: Knowledge of General Education information will improve Description: Including pain rating scale, medication(s)/side effects and non-pharmacologic comfort measures Outcome: Progressing

## 2023-07-06 NOTE — Consult Note (Addendum)
 [facility]  Prenatal Consult      07/06/2023   9:03 PM  I was asked by Dr. Izell to consult on this patient for possible preterm delivery. I had the pleasure of meeting with Shirley [name] and her family today.   She is a 33 yo [phone] female presenting at [redacted]w[redacted]d IUP with concerns for worsening pre-eclampsia (elevated BPs, new SOB) and possible need for preterm delivery. Pregnancy has also been complicated by cHTN. She has received BMZ x2 (last dose 1/3). Most recent estimated fetal weight of 1027 grams on 1/3. She is having a baby boy.   We discussed the possible needs for an infant born at this gestation. I explained that the neonatal intensive care team would be present for the delivery and outlined the likely delivery room course for this baby including routine resuscitation and NRP-guided approaches to the treatment of respiratory distress. We discussed the potential need for CPAP or mechanical ventilation and surfactant administration for respiratory distress, IV fluids pending establishment of enteral feeds, temperature support, and monitoring.   We discussed other common problems associated with prematurity including respiratory distress syndrome/CLD, feeding issues, temperature regulation, and infection risk. I also discussed the potential risk of complications such as intracranial hemorrhage and a range of long term developmental delays, and how these risks decrease with each additional week of gestation.  We discussed the importance of good nutrition and various methods of providing nutrition (parenteral hyperalimentation, gavage feedings and/or oral feeding). We discussed the benefits of human milk. I encouraged breast feeding and pumping soon after birth and outlined resources that are available to support breast feeding. We discussed the possibility of using donor breast milk as a bridge, and she expressed the desire to use DBM if needed to supplement  MBM.  We discussed the average length of stay but I noted that the actual LOS would depend on the severity of problems encountered and response to treatments. We discussed visitation policies and the resources available while her child is in the hospital.  She expressed understanding and agreement with the plan for resuscitation and intensive care. All questions were answered.  Thank you for involving us  in the care of this patient. A member of our team will be available should the family have additional questions.   Alyce Kirsch, MD Neonatal Medicine  I spent ~40 minutes in consultation time, of which 30 minutes was spent in direct face to face counseling.

## 2023-07-06 NOTE — Progress Notes (Signed)
 Have seen pt. And reviewed labs and discussed with Dr. Ileana, MFM. Due to pre-eclampsia with severe features including pulmonary edema, we will move to delivery. She has an elevated BNP and troponins, which are elevated and consistent with strain.  XCR is c/w pulmonary edema. Paged cards fellow. EKG is unchanged and ECHO shows mildly decreased EF with asymmetric septal hypertrophy, normal right sided pressures. Her BMI is > 40 and she has h/o SVD x 3. Baby is vertex by bedside u/s. Will move to delivery, add magnesium  for severe Pre-E at lower dose due to elevated Cr.  Pr:Cr is 2.88. Lasix  is given and added Hydral for afterload reduction.  Would change to ACE-I, ARB or the like post delivery. On Telemetry. Aggressive IOL as baby tolerates. Most recent EFW is 36%. Formal cardiology consult post delivery.

## 2023-07-07 ENCOUNTER — Encounter (HOSPITAL_COMMUNITY): Payer: Self-pay | Admitting: Obstetrics & Gynecology

## 2023-07-07 ENCOUNTER — Other Ambulatory Visit: Payer: Self-pay

## 2023-07-07 ENCOUNTER — Encounter (HOSPITAL_COMMUNITY)
Admission: AD | Disposition: A | Discharge status: Home / Self Care | Payer: Self-pay | Source: Home / Self Care | Attending: Obstetrics & Gynecology

## 2023-07-07 ENCOUNTER — Inpatient Hospital Stay (HOSPITAL_COMMUNITY): Payer: Medicaid Other | Admitting: Anesthesiology

## 2023-07-07 DIAGNOSIS — O1413 Severe pre-eclampsia, third trimester: Secondary | ICD-10-CM

## 2023-07-07 DIAGNOSIS — Z3A00 Weeks of gestation of pregnancy not specified: Secondary | ICD-10-CM

## 2023-07-07 DIAGNOSIS — O10013 Pre-existing essential hypertension complicating pregnancy, third trimester: Secondary | ICD-10-CM

## 2023-07-07 DIAGNOSIS — I429 Cardiomyopathy, unspecified: Secondary | ICD-10-CM

## 2023-07-07 DIAGNOSIS — I161 Hypertensive emergency: Secondary | ICD-10-CM

## 2023-07-07 DIAGNOSIS — O99412 Diseases of the circulatory system complicating pregnancy, second trimester: Secondary | ICD-10-CM

## 2023-07-07 DIAGNOSIS — Z3A27 27 weeks gestation of pregnancy: Secondary | ICD-10-CM

## 2023-07-07 DIAGNOSIS — O1492 Unspecified pre-eclampsia, second trimester: Secondary | ICD-10-CM

## 2023-07-07 DIAGNOSIS — Z3A23 23 weeks gestation of pregnancy: Secondary | ICD-10-CM

## 2023-07-07 DIAGNOSIS — Z302 Encounter for sterilization: Secondary | ICD-10-CM

## 2023-07-07 DIAGNOSIS — J810 Acute pulmonary edema: Secondary | ICD-10-CM

## 2023-07-07 DIAGNOSIS — O1415 Severe pre-eclampsia, complicating the puerperium: Secondary | ICD-10-CM

## 2023-07-07 DIAGNOSIS — Z8759 Personal history of other complications of pregnancy, childbirth and the puerperium: Secondary | ICD-10-CM

## 2023-07-07 DIAGNOSIS — J9601 Acute respiratory failure with hypoxia: Secondary | ICD-10-CM

## 2023-07-07 HISTORY — DX: Acute pulmonary edema: J81.0

## 2023-07-07 HISTORY — PX: TUBAL LIGATION: SHX77

## 2023-07-07 LAB — CBC WITH DIFFERENTIAL/PLATELET
Abs Immature Granulocytes: 0.1 10*3/uL — ABNORMAL HIGH (ref 0.00–0.07)
Basophils Absolute: 0.1 10*3/uL (ref 0.0–0.1)
Basophils Relative: 0 %
Eosinophils Absolute: 0 10*3/uL (ref 0.0–0.5)
Eosinophils Relative: 0 %
HCT: 33.4 % — ABNORMAL LOW (ref 36.0–46.0)
Hemoglobin: 10.7 g/dL — ABNORMAL LOW (ref 12.0–15.0)
Immature Granulocytes: 1 %
Lymphocytes Relative: 11 %
Lymphs Abs: 1.9 10*3/uL (ref 0.7–4.0)
MCH: 27.4 pg (ref 26.0–34.0)
MCHC: 32 g/dL (ref 30.0–36.0)
MCV: 85.4 fL (ref 80.0–100.0)
Monocytes Absolute: 0.8 10*3/uL (ref 0.1–1.0)
Monocytes Relative: 5 %
Neutro Abs: 14 10*3/uL — ABNORMAL HIGH (ref 1.7–7.7)
Neutrophils Relative %: 83 %
Platelets: 244 10*3/uL (ref 150–400)
RBC: 3.91 MIL/uL (ref 3.87–5.11)
RDW: 16.2 % — ABNORMAL HIGH (ref 11.5–15.5)
WBC: 16.9 10*3/uL — ABNORMAL HIGH (ref 4.0–10.5)
nRBC: 0.5 % — ABNORMAL HIGH (ref 0.0–0.2)

## 2023-07-07 LAB — CBC
HCT: 34.3 % — ABNORMAL LOW (ref 36.0–46.0)
HCT: 28.9 % — ABNORMAL LOW (ref 36.0–46.0)
Hemoglobin: 11.2 g/dL — ABNORMAL LOW (ref 12.0–15.0)
Hemoglobin: 9.3 g/dL — ABNORMAL LOW (ref 12.0–15.0)
MCH: 27.5 pg (ref 26.0–34.0)
MCH: 27.3 pg (ref 26.0–34.0)
MCHC: 32.7 g/dL (ref 30.0–36.0)
MCHC: 32.2 g/dL (ref 30.0–36.0)
MCV: 84.3 fL (ref 80.0–100.0)
MCV: 84.8 fL (ref 80.0–100.0)
Platelets: 257 10*3/uL (ref 150–400)
Platelets: 220 10*3/uL (ref 150–400)
RBC: 4.07 MIL/uL (ref 3.87–5.11)
RBC: 3.41 MIL/uL — ABNORMAL LOW (ref 3.87–5.11)
RDW: 16 % — ABNORMAL HIGH (ref 11.5–15.5)
RDW: 16 % — ABNORMAL HIGH (ref 11.5–15.5)
WBC: 16.2 10*3/uL — ABNORMAL HIGH (ref 4.0–10.5)
WBC: 14.1 10*3/uL — ABNORMAL HIGH (ref 4.0–10.5)
nRBC: 0.6 % — ABNORMAL HIGH (ref 0.0–0.2)
nRBC: 0.7 % — ABNORMAL HIGH (ref 0.0–0.2)

## 2023-07-07 LAB — COMPREHENSIVE METABOLIC PANEL
ALT: 26 U/L (ref 0–44)
ALT: 38 U/L (ref 0–44)
AST: 31 U/L (ref 15–41)
AST: 42 U/L — ABNORMAL HIGH (ref 15–41)
Albumin: 2.2 g/dL — ABNORMAL LOW (ref 3.5–5.0)
Albumin: 2.2 g/dL — ABNORMAL LOW (ref 3.5–5.0)
Alkaline Phosphatase: 110 U/L (ref 38–126)
Alkaline Phosphatase: 105 U/L (ref 38–126)
Anion gap: 10 (ref 5–15)
Anion gap: 10 (ref 5–15)
BUN: 16 mg/dL (ref 6–20)
BUN: 19 mg/dL (ref 6–20)
CO2: 21 mmol/L — ABNORMAL LOW (ref 22–32)
CO2: 23 mmol/L (ref 22–32)
Calcium: 8.3 mg/dL — ABNORMAL LOW (ref 8.9–10.3)
Calcium: 7.9 mg/dL — ABNORMAL LOW (ref 8.9–10.3)
Chloride: 103 mmol/L (ref 98–111)
Chloride: 101 mmol/L (ref 98–111)
Creatinine, Ser: 0.97 mg/dL (ref 0.44–1.00)
Creatinine, Ser: 1.19 mg/dL — ABNORMAL HIGH (ref 0.44–1.00)
GFR, Estimated: >60 mL/min (ref >60)
GFR, Estimated: >60 mL/min (ref >60)
Glucose, Bld: 93 mg/dL (ref 70–99)
Glucose, Bld: 106 mg/dL — ABNORMAL HIGH (ref 70–99)
Potassium: 4.1 mmol/L (ref 3.5–5.1)
Potassium: 4.3 mmol/L (ref 3.5–5.1)
Sodium: 134 mmol/L — ABNORMAL LOW (ref 135–145)
Sodium: 134 mmol/L — ABNORMAL LOW (ref 135–145)
Total Bilirubin: 0.5 mg/dL (ref 0.0–1.2)
Total Bilirubin: 0.5 mg/dL (ref 0.0–1.2)
Total Protein: 6.6 g/dL (ref 6.5–8.1)
Total Protein: 6.6 g/dL (ref 6.5–8.1)

## 2023-07-07 LAB — MRSA NEXT GEN BY PCR, NASAL: MRSA by PCR Next Gen: NOT DETECTED

## 2023-07-07 LAB — MAGNESIUM: Magnesium: 3.6 mg/dL — ABNORMAL HIGH (ref 1.7–2.4)

## 2023-07-07 LAB — RPR: RPR Ser Ql: NONREACTIVE

## 2023-07-07 SURGERY — Surgical Case
Anesthesia: Epidural

## 2023-07-07 MED ORDER — ONDANSETRON HCL 4 MG/2ML IJ SOLN
INTRAMUSCULAR | Status: AC
  Filled 2023-07-07: qty 2

## 2023-07-07 MED ORDER — MISOPROSTOL 50MCG HALF TABLET: 50.0000 ug | ORAL_TABLET | ORAL | Status: DC | PRN

## 2023-07-07 MED ORDER — OXYTOCIN-SODIUM CHLORIDE 30-0.9 UT/500ML-% IV SOLN
2.5000 [IU]/h | INTRAVENOUS | Status: AC
  Administered 2023-07-07: 2.5 [IU]/h via INTRAVENOUS
  Filled 2023-07-07: qty 500

## 2023-07-07 MED ORDER — COCONUT OIL OIL
1.0000 | TOPICAL_OIL | Status: DC | PRN
  Administered 2023-07-09: 1 via TOPICAL

## 2023-07-07 MED ORDER — PRENATAL MULTIVITAMIN CH
1.0000 | ORAL_TABLET | Freq: Every day | ORAL | Status: DC
  Administered 2023-07-08 – 2023-07-10 (×3): 1 via ORAL
  Filled 2023-07-07 (×3): qty 1

## 2023-07-07 MED ORDER — CEFAZOLIN SODIUM-DEXTROSE 2-4 GM/100ML-% IV SOLN
INTRAVENOUS | Status: AC
  Filled 2023-07-07: qty 100

## 2023-07-07 MED ORDER — MORPHINE SULFATE (PF) 2 MG/ML IV SOLN
2.0000 mg | Freq: Once | INTRAVENOUS | Status: AC
  Administered 2023-07-07: 2 mg via INTRAVENOUS
  Filled 2023-07-07: qty 1

## 2023-07-07 MED ORDER — FUROSEMIDE 10 MG/ML IJ SOLN
80.0000 mg | Freq: Two times a day (BID) | INTRAMUSCULAR | Status: DC
  Administered 2023-07-07 – 2023-07-08 (×2): 80 mg via INTRAVENOUS
  Filled 2023-07-07 (×2): qty 8

## 2023-07-07 MED ORDER — FENTANYL CITRATE (PF) 100 MCG/2ML IJ SOLN
INTRAMUSCULAR | Status: AC
  Filled 2023-07-07: qty 2

## 2023-07-07 MED ORDER — SIMETHICONE 80 MG PO CHEW: 80.0000 mg | CHEWABLE_TABLET | ORAL | Status: DC | PRN

## 2023-07-07 MED ORDER — LIDOCAINE-EPINEPHRINE (PF) 2 %-1:200000 IJ SOLN
INTRAMUSCULAR | Status: DC | PRN
  Administered 2023-07-07: 3 mL via EPIDURAL
  Administered 2023-07-07: 5 mL via EPIDURAL
  Administered 2023-07-07: 2 mL via EPIDURAL
  Administered 2023-07-07: 10 mL via EPIDURAL

## 2023-07-07 MED ORDER — SENNOSIDES-DOCUSATE SODIUM 8.6-50 MG PO TABS
2.0000 | ORAL_TABLET | Freq: Every day | ORAL | Status: DC
  Administered 2023-07-08 – 2023-07-10 (×3): 2 via ORAL
  Filled 2023-07-07 (×3): qty 2

## 2023-07-07 MED ORDER — SODIUM CHLORIDE 0.9 % IR SOLN
Status: DC | PRN
  Administered 2023-07-07 (×2): 1000 mL

## 2023-07-07 MED ORDER — ACETAMINOPHEN 500 MG PO TABS
1000.0000 mg | ORAL_TABLET | Freq: Four times a day (QID) | ORAL | Status: DC
  Administered 2023-07-07 – 2023-07-09 (×7): 1000 mg via ORAL
  Filled 2023-07-07 (×7): qty 2

## 2023-07-07 MED ORDER — MENTHOL 3 MG MT LOZG: 1.0000 | LOZENGE | OROMUCOSAL | Status: DC | PRN

## 2023-07-07 MED ORDER — ORAL CARE MOUTH RINSE
15.0000 mL | OROMUCOSAL | Status: DC | PRN
Start: 2023-07-07 — End: 2023-07-10

## 2023-07-07 MED ORDER — EPHEDRINE 5 MG/ML INJ
10.0000 mg | INTRAVENOUS | Status: DC | PRN
Start: 2023-07-07 — End: 2023-07-07

## 2023-07-07 MED ORDER — FENTANYL-BUPIVACAINE-NACL 0.5-0.125-0.9 MG/250ML-% EP SOLN
12.0000 mL/h | EPIDURAL | Status: DC | PRN
  Administered 2023-07-07: 12 mL/h via EPIDURAL
  Filled 2023-07-07: qty 250

## 2023-07-07 MED ORDER — MORPHINE SULFATE (PF) 2 MG/ML IV SOLN: 2.0000 mg | INTRAVENOUS | Status: DC | PRN

## 2023-07-07 MED ORDER — FENTANYL CITRATE (PF) 100 MCG/2ML IJ SOLN: 25.0000 ug | INTRAMUSCULAR | Status: DC | PRN

## 2023-07-07 MED ORDER — OXYTOCIN-SODIUM CHLORIDE 30-0.9 UT/500ML-% IV SOLN
INTRAVENOUS | Status: DC | PRN
  Administered 2023-07-07: 30 [IU] via INTRAVENOUS

## 2023-07-07 MED ORDER — KETOROLAC TROMETHAMINE 30 MG/ML IJ SOLN
30.0000 mg | Freq: Once | INTRAMUSCULAR | Status: AC | PRN
  Administered 2023-07-07: 30 mg via INTRAVENOUS

## 2023-07-07 MED ORDER — IBUPROFEN 600 MG PO TABS
600.0000 mg | ORAL_TABLET | Freq: Four times a day (QID) | ORAL | Status: DC
  Administered 2023-07-08 – 2023-07-09 (×4): 600 mg via ORAL
  Filled 2023-07-07 (×4): qty 1

## 2023-07-07 MED ORDER — MORPHINE SULFATE (PF) 0.5 MG/ML IJ SOLN
INTRAMUSCULAR | Status: DC | PRN
  Administered 2023-07-07: 3 mg via EPIDURAL

## 2023-07-07 MED ORDER — DEXMEDETOMIDINE HCL IN NACL 80 MCG/20ML IV SOLN
INTRAVENOUS | Status: DC | PRN
  Administered 2023-07-07: 12 ug via INTRAVENOUS

## 2023-07-07 MED ORDER — SODIUM CHLORIDE 0.9 % IV SOLN
INTRAVENOUS | Status: AC
  Filled 2023-07-07: qty 3

## 2023-07-07 MED ORDER — EPHEDRINE SULFATE-NACL 50-0.9 MG/10ML-% IV SOSY
PREFILLED_SYRINGE | INTRAVENOUS | Status: DC | PRN
  Administered 2023-07-07: 10 mg via INTRAVENOUS

## 2023-07-07 MED ORDER — DIPHENHYDRAMINE HCL 50 MG/ML IJ SOLN
INTRAMUSCULAR | Status: AC
  Filled 2023-07-07: qty 1

## 2023-07-07 MED ORDER — KETOROLAC TROMETHAMINE 30 MG/ML IJ SOLN
30.0000 mg | Freq: Four times a day (QID) | INTRAMUSCULAR | Status: AC
  Administered 2023-07-08 (×3): 30 mg via INTRAVENOUS
  Filled 2023-07-07 (×3): qty 1

## 2023-07-07 MED ORDER — FENTANYL CITRATE (PF) 100 MCG/2ML IJ SOLN
INTRAMUSCULAR | Status: DC | PRN
  Administered 2023-07-07: 100 ug via EPIDURAL

## 2023-07-07 MED ORDER — PHENYLEPHRINE 80 MCG/ML (10ML) SYRINGE FOR IV PUSH (FOR BLOOD PRESSURE SUPPORT)
80.0000 ug | PREFILLED_SYRINGE | INTRAVENOUS | Status: DC | PRN
  Filled 2023-07-07: qty 10

## 2023-07-07 MED ORDER — DEXMEDETOMIDINE HCL IN NACL 80 MCG/20ML IV SOLN
INTRAVENOUS | Status: AC
  Filled 2023-07-07: qty 20

## 2023-07-07 MED ORDER — HYDROXYZINE HCL 10 MG PO TABS
10.0000 mg | ORAL_TABLET | Freq: Three times a day (TID) | ORAL | Status: DC | PRN
  Administered 2023-07-07 – 2023-07-08 (×3): 10 mg via ORAL
  Filled 2023-07-07 (×4): qty 1

## 2023-07-07 MED ORDER — SODIUM CHLORIDE 0.9 % IV SOLN
INTRAVENOUS | Status: DC | PRN
  Administered 2023-07-07: 500 mg via INTRAVENOUS

## 2023-07-07 MED ORDER — ONDANSETRON HCL 4 MG/2ML IJ SOLN
INTRAMUSCULAR | Status: DC | PRN
  Administered 2023-07-07: 4 mg via INTRAVENOUS

## 2023-07-07 MED ORDER — LABETALOL HCL 200 MG PO TABS
400.0000 mg | ORAL_TABLET | Freq: Three times a day (TID) | ORAL | Status: DC
  Filled 2023-07-07 (×3): qty 2

## 2023-07-07 MED ORDER — OXYCODONE HCL 5 MG PO TABS
5.0000 mg | ORAL_TABLET | ORAL | Status: DC | PRN
  Administered 2023-07-07: 5 mg via ORAL
  Administered 2023-07-08 – 2023-07-10 (×8): 10 mg via ORAL
  Filled 2023-07-07: qty 1
  Filled 2023-07-07 (×8): qty 2

## 2023-07-07 MED ORDER — MAGNESIUM SULFATE 40 GM/1000ML IV SOLN
1.0000 g/h | INTRAVENOUS | Status: AC
  Administered 2023-07-07 – 2023-07-08 (×2): 1 g/h via INTRAVENOUS

## 2023-07-07 MED ORDER — TERBUTALINE SULFATE 1 MG/ML IJ SOLN: 0.2500 mg | Freq: Once | INTRAMUSCULAR | Status: DC | PRN

## 2023-07-07 MED ORDER — DIPHENHYDRAMINE HCL 25 MG PO CAPS
25.0000 mg | ORAL_CAPSULE | Freq: Four times a day (QID) | ORAL | Status: DC | PRN
  Administered 2023-07-07 – 2023-07-08 (×2): 25 mg via ORAL
  Filled 2023-07-07 (×2): qty 1

## 2023-07-07 MED ORDER — ENOXAPARIN SODIUM 40 MG/0.4ML IJ SOSY
40.0000 mg | PREFILLED_SYRINGE | INTRAMUSCULAR | Status: DC
  Administered 2023-07-08 – 2023-07-10 (×3): 40 mg via SUBCUTANEOUS
  Filled 2023-07-07 (×3): qty 0.4

## 2023-07-07 MED ORDER — KETOROLAC TROMETHAMINE 30 MG/ML IJ SOLN
INTRAMUSCULAR | Status: AC
  Filled 2023-07-07: qty 1

## 2023-07-07 MED ORDER — DIBUCAINE (PERIANAL) 1 % EX OINT: 1.0000 | TOPICAL_OINTMENT | CUTANEOUS | Status: DC | PRN

## 2023-07-07 MED ORDER — WITCH HAZEL-GLYCERIN EX PADS: 1.0000 | MEDICATED_PAD | CUTANEOUS | Status: DC | PRN

## 2023-07-07 MED ORDER — EPHEDRINE 5 MG/ML INJ: 10.0000 mg | INTRAVENOUS | Status: DC | PRN

## 2023-07-07 MED ORDER — LIDOCAINE-EPINEPHRINE (PF) 2 %-1:200000 IJ SOLN
INTRAMUSCULAR | Status: AC
Start: 2023-07-07 — End: ?
  Filled 2023-07-07: qty 20

## 2023-07-07 MED ORDER — CHLORHEXIDINE GLUCONATE CLOTH 2 % EX PADS
6.0000 | MEDICATED_PAD | Freq: Every day | CUTANEOUS | Status: DC
  Administered 2023-07-07: 6 via TOPICAL

## 2023-07-07 MED ORDER — CEFAZOLIN SODIUM-DEXTROSE 2-3 GM-%(50ML) IV SOLR
INTRAVENOUS | Status: DC | PRN
  Administered 2023-07-07: 3 g via INTRAVENOUS

## 2023-07-07 MED ORDER — OXYTOCIN-SODIUM CHLORIDE 30-0.9 UT/500ML-% IV SOLN
1.0000 m[IU]/min | INTRAVENOUS | Status: DC
  Administered 2023-07-07: 2 m[IU]/min via INTRAVENOUS
  Filled 2023-07-07: qty 500

## 2023-07-07 MED ORDER — SIMETHICONE 80 MG PO CHEW
80.0000 mg | CHEWABLE_TABLET | Freq: Three times a day (TID) | ORAL | Status: DC
  Administered 2023-07-07 – 2023-07-10 (×8): 80 mg via ORAL
  Filled 2023-07-07 (×9): qty 1

## 2023-07-07 MED ORDER — LACTATED RINGERS IV SOLN: INTRAVENOUS | Status: AC

## 2023-07-07 MED ORDER — LIDOCAINE HCL (PF) 1 % IJ SOLN
INTRAMUSCULAR | Status: DC | PRN
  Administered 2023-07-07 (×2): 4 mL via EPIDURAL

## 2023-07-07 MED ORDER — PHENYLEPHRINE 80 MCG/ML (10ML) SYRINGE FOR IV PUSH (FOR BLOOD PRESSURE SUPPORT): 80.0000 ug | PREFILLED_SYRINGE | INTRAVENOUS | Status: DC | PRN

## 2023-07-07 MED ORDER — IPRATROPIUM-ALBUTEROL 0.5-2.5 (3) MG/3ML IN SOLN
3.0000 mL | Freq: Every day | RESPIRATORY_TRACT | Status: DC | PRN
  Administered 2023-07-07 (×5): 3 mL via RESPIRATORY_TRACT
  Filled 2023-07-07: qty 3
  Filled 2023-07-07 (×2): qty 6

## 2023-07-07 MED ORDER — LACTATED RINGERS IV SOLN: 500.0000 mL | Freq: Once | INTRAVENOUS | Status: DC

## 2023-07-07 MED ORDER — ACETAMINOPHEN 325 MG PO TABS
650.0000 mg | ORAL_TABLET | ORAL | Status: DC | PRN
  Filled 2023-07-07: qty 2

## 2023-07-07 MED ORDER — MORPHINE SULFATE (PF) 0.5 MG/ML IJ SOLN
INTRAMUSCULAR | Status: AC
  Filled 2023-07-07: qty 10

## 2023-07-07 MED ORDER — DIPHENHYDRAMINE HCL 50 MG/ML IJ SOLN
12.5000 mg | INTRAMUSCULAR | Status: DC | PRN
  Administered 2023-07-07: 12.5 mg via INTRAVENOUS

## 2023-07-07 SURGICAL SUPPLY — 32 items
BENZOIN TINCTURE PRP APPL 2/3 (GAUZE/BANDAGES/DRESSINGS) IMPLANT
CHLORAPREP W/TINT 26 (MISCELLANEOUS) ×4 IMPLANT
CLAMP UMBILICAL CORD (MISCELLANEOUS) ×2 IMPLANT
CLOTH BEACON ORANGE TIMEOUT ST (SAFETY) ×2 IMPLANT
DERMABOND ADVANCED .7 DNX12 (GAUZE/BANDAGES/DRESSINGS) ×4 IMPLANT
DRSG OPSITE POSTOP 4X10 (GAUZE/BANDAGES/DRESSINGS) ×2 IMPLANT
ELECT REM PT RETURN 9FT ADLT (ELECTROSURGICAL) ×2 IMPLANT
ELECTRODE REM PT RTRN 9FT ADLT (ELECTROSURGICAL) ×2 IMPLANT
EXTRACTOR VACUUM M CUP 4 TUBE (SUCTIONS) IMPLANT
GAUZE SPONGE 4X4 12PLY STRL LF (GAUZE/BANDAGES/DRESSINGS) IMPLANT
GLOVE BIOGEL PI IND STRL 7.0 (GLOVE) ×4 IMPLANT
GLOVE BIOGEL PI IND STRL 7.5 (GLOVE) ×4 IMPLANT
GLOVE ECLIPSE 7.5 STRL STRAW (GLOVE) ×2 IMPLANT
GOWN STRL REUS W/TWL LRG LVL3 (GOWN DISPOSABLE) ×6 IMPLANT
KIT ABG SYR 3ML LUER SLIP (SYRINGE) IMPLANT
LIGASURE IMPACT 36 18CM CVD LR (INSTRUMENTS) IMPLANT
NDL HYPO 25X5/8 SAFETYGLIDE (NEEDLE) IMPLANT
NEEDLE HYPO 25X5/8 SAFETYGLIDE (NEEDLE) IMPLANT
NS IRRIG 1000ML POUR BTL (IV SOLUTION) ×2 IMPLANT
PACK C SECTION WH (CUSTOM PROCEDURE TRAY) ×2 IMPLANT
PAD ABD 8X10 STRL (GAUZE/BANDAGES/DRESSINGS) IMPLANT
PAD OB MATERNITY 4.3X12.25 (PERSONAL CARE ITEMS) ×2 IMPLANT
RTRCTR C-SECT PINK 25CM LRG (MISCELLANEOUS) ×2 IMPLANT
SUT MNCRL 0 VIOLET CTX 36 (SUTURE) ×4 IMPLANT
SUT VIC AB 0 CTX36XBRD ANBCTRL (SUTURE) ×2 IMPLANT
SUT VIC AB 2-0 CT1 TAPERPNT 27 (SUTURE) ×2 IMPLANT
SUT VIC AB 4-0 KS 27 (SUTURE) ×2 IMPLANT
SUTURE PLAIN GUT 2.0 ETHICON (SUTURE) IMPLANT
TAPE CLOTH SURG 4X10 WHT LF (GAUZE/BANDAGES/DRESSINGS) IMPLANT
TOWEL OR 17X24 6PK STRL BLUE (TOWEL DISPOSABLE) ×2 IMPLANT
TRAY FOLEY W/BAG SLVR 14FR LF (SET/KITS/TRAYS/PACK) ×2 IMPLANT
WATER STERILE IRR 1000ML POUR (IV SOLUTION) ×2 IMPLANT

## 2023-07-07 NOTE — Consult Note (Addendum)
 NAME:  Shirley [name], MRN:  [id number], DOB:  02/25/1991, LOS: 4 ADMISSION DATE:  07/03/2023 CONSULTATION DATE:  07/07/2023 REFERRING MD:  Barbra - OB CHIEF COMPLAINT:  Pulmonary edema, preeclampsia   History of Present Illness:  33 year old woman [id number] who presented to [facility] 2H ICU as a transfer from [facility] for pulmonary edema in the setting of preeclampsia. PMHx significant for uncontrolled HTN (noncompliant with medications, also with history of preeclampsia requiring induction at 36 weeks in 2019), prediabetes, GERD, chronic LBP, anxiety/depression.  Initially presented to [facility] 1/2 for headache, nausea in the setting of uncontrolled HTN refractory to IV medications/superimposed preeclampsia. At time of presentation, patient was [redacted]w[redacted]d and hypertensive with SBP 170s-180s. Multiple PO/IV medications (labetalol , nifedipine , hydralazine ) given with some improvement. Patient was symptomatically improving with plan for discharge 1/5 until she became acutely SOB with new O2 requirement of 3L. Echo was obtained demonstrating EF 50-55%, low normal LV function without RWMAs and normal diastolic function; mild to moderate MR was noted. Troponin was noted to be elevated to 152 and BNP 538. Cr 2.88. CXR showed new perihilar/bibasilar infiltrates versus edema. Given preeclampsia in the setting of developing pulmonary edema, decision to move to delivery. Mg gtt initiated, Lasix  and hydralazine  given (5L off 1/5, additional 2L off 1/6). Labor was induced; later 1/6 fetal HR decels were noted prompting transition to C-section.   Patient underwent C-section and bilateral salpingectomy 1/6. EBL . Intraoperative course was uncomplicated. Postoperatively, patient was noted to have ongoing high O2 needs (SpO2 high 80s) on [place] and AHF/PCCM were consulted for ICU admission/management.  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   MRI (06/11/2016)  T11-T12: degenerative disc bulge and facet hypertrophy w/o significant stenosis  L4-L5: mild b/l facet arthrosis  L5-S1: mild b/l facet arthrosis, sm central disc protrusion w/o significant stenosis or frank neural impingement and mod b/l foraminal narrowing at L5-S1 related to disc bulge and facet disease, right worse than left     Vitamin D  deficiency 10/05/2019   Significant Hospital Events: Including procedures, antibiotic start and stop dates in addition to other pertinent events   1/2 - Presented to [facility] for HA, nausea and high BP, concern for hypertensive urgency/preeclampsia. Admitted for close monitoring. 1/5 - Echo completed with EF 50-55%, low normal LV function without RWMAs and normal diastolic function; mild to moderate MR was noted. BNP/trop elevated as well as Cr. CXR c/f pulmonary edema. Decision to proceed with delivery. 1/6 - IOL completed, later in day fetal HR decels noted prompting C-section. Underwent C-section and bilateral salpingectomy. SpO2 80s on HFNC postoperatively. AHF/PCCM consulted for ICU.  Interim History / Subjective:  PCCM consulted for ICU transfer and management.  Objective:  Blood pressure 115/72, pulse 62, temperature (!) 97.3 F (36.3 C), temperature source  Oral, resp. rate 17, height 5' 7 (1.702 m), last menstrual period 12/26/2022, SpO2 97%, unknown if currently breastfeeding.    FiO2 (%):  [100 %] 100 %    Intake/Output Summary (Last 24 hours) at 07/07/2023 1547 Last data filed at 07/07/2023 1406 Gross per 24 hour  Intake 895.3 ml  Output 7600 ml  Net -6704.7 ml   There were no vitals filed for this visit.  Physical Examination: General: Acutely ill-appearing woman in mild respiratory distress. HEENT: [place]/AT, anicteric sclera, PERRL, moist mucous membranes. Neuro: Awake, oriented x 4, at times slow to respond. Responds to verbal stimuli. Following commands consistently. Moves all 4 extremities spontaneously. Mild generalized weakness. CV: RRR, no m/g/r. PULM: Breathing even and unlabored on HHFNC (25L, FiO2 45%). Lung fields with fine crackles. GI: Soft, appropriate mild TTP postoperatively, mildly distended. Pfannenstiel incision present with dressing clean/dry/intact. Extremities: Trace bilateral symmetric LE edema noted. Skin: Warm/dry, no rashes.  Resolved Hospital Problem List:    Assessment & Plan:  Preeclampsia Hypertensive emergency S/p Cesarean section and bilateral salpingectomy - Admit to ICU for further care - Postoperative management per OB team - Continue Mg gtt x 24H for seizure ppx (per OB team) - Normotensive at present, not on any gtt - Resume home meds as able, SW/TOC following for med assistance - Cardiac monitoring  Elevated troponin, suspect demand-related - Trend trop/BNP - Optimize electrolyes (K > 4, Mg > 2) - Cardiac monitoring  Pulmonary edema in the setting of preeclampsia Acute respiratory distress - Diuresis as renal function tolerates, goal net even to slightly net negative - Monitor strict I&Os, daily weights - Supplemental O2 support, continue HHFNC for now, wean as able to [place] - Wean O2 for sat > 90% - DuoNebs PRN - Pulmonary hygiene - Follow CXR  AKI in the setting off uncontrolled HTN - Trend BMP - Replete electrolytes as indicated - Monitor I&Os - Avoid nephrotoxic agents as able - Ensure adequate renal perfusion  GERD -  PPI  Anxiety Depression Concern for food/transportation insecurity - SW involved, appreciate assistance  Best Practice: (right click and Reselect all SmartList Selections daily)   Diet/type: NPO, ADAT as tolerated DVT prophylaxis: LMWH GI prophylaxis: PPI Lines: N/A Foley:  N/A Code Status:  full code Last date of multidisciplinary goals of care discussion [Per OB Team]  Labs:  CBC: Recent Labs  Lab 07/03/23 1251 07/06/23 1849 07/07/23 0520 07/07/23 1456  WBC 10.7* 15.1* 16.2* 14.1*  HGB 10.8* 9.2* 11.2* 9.3*  HCT 33.7* 28.8* 34.3* 28.9*  MCV 84.3 86.0 84.3 84.8  PLT 264 221 257 220   Basic Metabolic Panel: Recent Labs  Lab 07/03/23 1251 07/06/23 1849 07/07/23 0520  NA 135 134* 134*  K 3.4* 3.7 4.1  CL 101 106 103  CO2 23 20* 21*  GLUCOSE 104* 92 93  BUN 11 18 16   CREATININE 1.02* 0.94 0.97  CALCIUM  8.5* 8.4* 8.3*  MG  --   --  3.6*   GFR: CrCl cannot be calculated (Unknown ideal weight.). Recent Labs  Lab 07/03/23 1251 07/06/23 1849 07/07/23 0520 07/07/23 1456  WBC 10.7* 15.1* 16.2* 14.1*   Liver Function Tests: Recent Labs  Lab 07/03/23 1251 07/06/23 1849 07/07/23 0520  AST 14* 21 31  ALT 8 16 26   ALKPHOS 111 103 110  BILITOT 0.4 0.4 0.5  PROT 6.3* 5.7* 6.6  ALBUMIN 1.9* 1.9* 2.2*   No results for input(s): LIPASE, AMYLASE in the last 168 hours. No results for input(s): AMMONIA  in the last 168 hours.  ABG:    Component Value Date/Time   TCO2 30 10/17/2013 2013    Coagulation Profile: No results for input(s): INR, PROTIME in the last 168 hours.  Cardiac Enzymes: No results for input(s): CKTOTAL, CKMB, CKMBINDEX, TROPONINI in the last 168 hours.  HbA1C: Hemoglobin A1C  Date/Time Value Ref Range Status  09/16/2022 02:50 PM 5.6 4.0 - 5.6 % Final   Hgb A1c MFr Bld  Date/Time Value Ref Range Status  03/12/2023 10:42 AM 5.9 (H) 4.8 - 5.6 % Final    Comment:             Prediabetes: 5.7 - 6.4           Diabetes: >6.4          Glycemic control for adults with diabetes: <7.0   09/30/2019 03:30 PM 5.6 4.8 - 5.6 % Final    Comment:             Prediabetes: 5.7 - 6.4          Diabetes: >6.4          Glycemic control for adults with diabetes: <7.0    CBG: No results for input(s): GLUCAP in the last 168 hours.  Review of Systems:   Review of Systems  Constitutional:  Positive for malaise/fatigue. Negative for chills and fever.  HENT:  Negative for congestion.   Respiratory:  Positive for cough and shortness of breath.   Cardiovascular:  Positive for chest pain.  Gastrointestinal:  Positive for nausea. Negative for abdominal pain, heartburn and vomiting.  Genitourinary:  Positive for frequency.  Skin:  Negative for itching and rash.  Neurological:  Positive for dizziness and headaches. Negative for weakness.   Past Medical History:  She,  has a past medical history of Anxiety and depression (10/05/2019), Arthritis, Assistance needed with transportation (11/22/2019), Chest pain, Chronic low back pain (01/28/2018), Constipation, Depression, Disorder of hip joint (02/16/2018), Edema, lower extremity, Elevated alkaline phosphatase level (10/05/2019), GERD (gastroesophageal reflux disease), Hypertension, Joint pain, Low HDL (under 40) (10/05/2019), Neck pain (07/16/2019), Obesity, Osteoarthritis, Rheumatoid arthritis (HCC), Sciatica, Shortness of breath, Spinal stenosis of lumbar region with radiculopathy (09/16/2015), and Vitamin D  deficiency (10/05/2019).   Surgical History:   Past Surgical History:  Procedure Laterality Date   TOOTH EXTRACTION N/A 10/22/2013   Procedure: DENTAL EXTRACTIONS TEETH #1, 16, 17, 32;  Surgeon: Glendia CHRISTELLA Primrose, DDS;  Location: [facility] OR;  Service: Oral Surgery;  Laterality: N/A;   Social History:   reports that she quit smoking about 6 years ago. Her smoking use included cigarettes. She has never used smokeless tobacco. She reports that she does not drink alcohol and  does not use drugs.   Family History:  Her family history includes Anxiety disorder in her mother; Depression in her mother; Diabetes in her maternal grandmother and paternal grandmother; Hypertension in her father and mother; Obesity in her mother; Stroke in her paternal grandmother.   Allergies: No Known Allergies   Home Medications: Prior to Admission medications   Medication Sig Start Date End Date Taking? Authorizing Provider  Blood Pressure Monitoring DEVI 1 each by Does not apply route once a week. 03/10/23  Yes Cleatus Moccasin, MD  aspirin  EC 81 MG tablet Take 2 tablets (162 mg total) by mouth daily. Take after 12 weeks for prevention of preeclampsia later in pregnancy Patient not taking: Reported on 05/08/2023 04/07/23   Eldonna Suzen Octave, MD  cyclobenzaprine  (FLEXERIL ) 10 MG tablet Take 1  tablet (10 mg total) by mouth 3 (three) times daily as needed for muscle spasms. 12/20/22     ferrous sulfate  325 (65 FE) MG EC tablet Take 1 tablet (325 mg total) by mouth every other day. 03/14/23   Cleatus Moccasin, MD  labetalol  (NORMODYNE ) 200 MG tablet Take 2 tablets (400 mg total) by mouth 2 (two) times daily. 05/08/23   Fredirick Glenys RAMAN, MD  metFORMIN  (GLUCOPHAGE ) 500 MG tablet Take 1 tablet (500 mg total) by mouth daily in the evening with largest meal. Patient not taking: Reported on 03/10/2023 12/20/22     metroNIDAZOLE  (FLAGYL ) 500 MG tablet Take 1 tablet (500 mg total) by mouth 2 (two) times daily. 03/12/23   Cleatus Moccasin, MD  NIFEdipine  (ADALAT  CC) 30 MG 24 hr tablet Take 2 tablets (60 mg total) by mouth daily. 04/07/23   Eldonna Suzen Octave, MD  Prenatal Vit-Fe Fumarate-FA (PREPLUS) 27-1 MG TABS Take 1 tablet by mouth daily. 02/24/23   Jhonny Augustin BROCKS, MD   Critical care time:   The patient is critically ill with multiple organ system failure and requires high complexity decision making for assessment and support, frequent evaluation and titration of therapies, advanced monitoring, review of  radiographic studies and interpretation of complex data.   Critical Care Time devoted to patient care services, exclusive of separately billable procedures, described in this note is 39 minutes.  Corean CHRISTELLA Coletta Lockner, PA-C [facility] 07/07/23 3:47 PM  Please see [email or web address] for pager details.  From 7A-7P if no response, please call [phone] After hours, please call ELink [phone]

## 2023-07-07 NOTE — Progress Notes (Signed)
 LABOR PROGRESS NOTE  Patient Name: Shirley [name], female   DOB: Oct 07, 1990, 33 y.o.  MRN: [id number]  [id number] at [redacted]w[redacted]d admitted for hypertensive emergency 1/2 with known cHTN. Developed O2 need, SOB today. Found to have cardiomyopathy and marked fluid overload. Now on L&D for IOL.  S: Still feeling SOB. No longer able to walk back and forth to the bathroom steadily. Is urinating large volumes.  O:  BP 126/76   Pulse 72   Temp 98.5 F (36.9 C) (Axillary)   Resp 20   Ht 5' 7 (1.702 m)   LMP 12/26/2022   SpO2 96%   BMI 47.82 kg/m  EFM:135 bpm/Moderate variability/ 10x10 accels/ None decels CAT: 1 - appropriate for gestational age Toco: none   CVE: fingertip/50/ballotable Vertex confirmed via BSUS   A&P:   #Labor: Dual cytotec  given. Discussed FB placement. Patient declined even after offering pre-procedure pain management. Discussed that we are trying to deliver her quickly as she is sick. Although babe is tolerating, this is not a guarantee especially if she continues to get sicker. She verbalized understanding. #Pain: Per pt preference #FWB: CAT 1 #GBS  unknown - PCN #Cautiously anticipate vaginal delivery  #Cardiomyopathy: Catheter in place for strict I's&O's. Lasix  40 mg IV Q6. Oxygen PRN for O2 >95%. Labs Q12. Tele.  #cHTN w/SIPE: Labetalol  600 Q8, Procardia  XL 30 BID, hydralazine  50 mg Q6. Mag 4 g bolus/1 g/hr given Cr 0.9. PRN hydralazine  for severe range.  Almarie CHRISTELLA Moats, MD FMOB Fellow, Faculty practice [facility], Center for [facility] 07/07/23  3:39 AM

## 2023-07-07 NOTE — Progress Notes (Signed)
 LABOR PROGRESS NOTE  Patient Name: Shirley [name], female   DOB: [birth year], 33 y.o.  MRN: [id number]  [id number] at [redacted]w[redacted]d admitted for hypertensive emergency 1/2 with known cHTN. Developed O2 need, SOB today. Found to have cardiomyopathy and marked fluid overload. Now on L&D for IOL.  S: Patient not feeling any significant contractions. Is feeling increasingly short of breath. Up to 5 L [place].  O:  BP (!) 161/90   Pulse 83   Temp 97.7 F (36.5 C) (Oral)   Resp 20   Ht 5' 7 (1.702 m)   LMP 12/26/2022   SpO2 91%   BMI 47.82 kg/m  EFM:125 bpm/Moderate variability/ 10x10 accels/ None decels CAT: 1 - reactive for gestation Toco: none   CVE: Dilation: 1 Effacement (%): 50 Cervical Position: Posterior Station: -3 Presentation: Vertex Exam by:: Nicholaus, MD   A&P:   #Labor: S/p dual cytotec . Made some cervical change. Another extensive discussion about need for quick induction in setting of worsening clinical status. Patient is now amenable to balloon placement. FB placed without difficulty with 40 cc. Will start pit 2x2 up to 8 U while balloon is in place. If balloon has not come out in 6 hours, will remove and AROM. AROM as soon as possible. #Pain: Per pt preference #FWB: CAT 1 #GBS  unknown > PCN #Anticipate vaginal delivery  #Cardiomyopathy: Catheter in place for strict I's&O's. Lasix  40 mg IV Q6. Oxygen PRN for O2 >95%. Labs Q12. Tele. Started morphine  2mg  Q1 PRN for air hunger/pain management in place of fentanyl .   #cHTN w/SIPE: Labetalol  600 Q8, Procardia  XL 30 BID, hydralazine  50 mg Q6. Mag 4 g bolus/1 g/hr given Cr 0.9. PRN hydralazine  for severe range.  Almarie CHRISTELLA Nicholaus, MD FMOB Fellow, Faculty practice [facility], Center for [facility] 07/07/23  7:19 AM

## 2023-07-07 NOTE — Progress Notes (Deleted)
 Unable to complete Delivery Summary without baby length. Called NICU and length has not been done yet, NICU RN reports will complete delivery summary upon measuring baby.

## 2023-07-07 NOTE — Progress Notes (Signed)
 Patient ID: Shirley [name], female   DOB: 1990/10/17, 33 y.o.   MRN: [id number]  Patient's O2 on [place] high 80s postoperatively. She was placed back on HF [place]. I spoke with Dr Cherrie with Advanced heart failure team with patient. He agreed with taking patient to 2H.  Starling Christofferson J Krysti Hickling, DO

## 2023-07-07 NOTE — Anesthesia Procedure Notes (Addendum)
 Date/Time: 07/07/2023 1:13 PM  Performed by: Sheppard Evens, CRNAPre-anesthesia Checklist: Patient identified Patient Re-evaluated:Patient Re-evaluated prior to induction Oxygen Delivery Method: Non-rebreather mask

## 2023-07-07 NOTE — Anesthesia Procedure Notes (Signed)
 Epidural Patient location during procedure: OB Start time: 07/07/2023 9:01 AM End time: 07/07/2023 9:06 AM  Staffing Anesthesiologist: Peggye Delon Brunswick, MD Performed: anesthesiologist   Preanesthetic Checklist Completed: patient identified, IV checked, site marked, risks and benefits discussed, surgical consent, monitors and equipment checked, pre-op evaluation and timeout performed  Epidural Patient position: sitting Prep: DuraPrep and site prepped and draped Patient monitoring: continuous pulse ox and blood pressure Approach: midline Location: L3-L4 Injection technique: LOR saline  Needle:  Needle type: Tuohy  Needle gauge: 17 G Needle length: 9 cm and 9 Needle insertion depth: 7.5 cm Catheter type: closed end flexible Catheter size: 19 Gauge Catheter at skin depth: 12.5 cm Test dose: negative  Assessment Events: blood not aspirated, no cerebrospinal fluid, injection not painful, no injection resistance, no paresthesia and negative IV test  Additional Notes The patient has requested an epidural for labor pain management. Risks and benefits including, but not limited to, infection, bleeding, local anesthetic toxicity, headache, hypotension, back pain, block failure, etc. were discussed with the patient. The patient expressed understanding and consented to the procedure. I confirmed that the patient has no bleeding disorders and is not taking blood thinners. I confirmed the patient's last platelet count with the nurse. A time-out was performed immediately prior to the procedure. Please see nursing documentation for vital signs. Sterile technique was used throughout the whole procedure. Once LOR achieved, the epidural catheter threaded easily without resistance. Aspiration of the catheter was negative for blood and CSF. The epidural was dosed slowly and an infusion was started.  1 attempt(s)Reason for block:procedure for pain

## 2023-07-07 NOTE — Op Note (Signed)
 Cesarean Section Operative Note   Patient: Shirley [name]  Date of Procedure: 07/07/2023  Procedure: Primary Low Transverse Cesarean   Indications: non-reassuring fetal status  Pre-operative Diagnosis: Nonreassuring Fetal Heart Tracing remote from delivery.   Post-operative Diagnosis: Same and Bilateral Tubal Sterilization via Bilateral salpingectomy  TOLAC Candidate: No  Surgeon: Surgeons and Role:    * Barbra Lang PARAS, DO - Primary    * Ilean Norleen GAILS, MD  Assistants: none  An experienced assistant was required given the standard of surgical care given the complexity of the case.  This assistant was needed for exposure, dissection, suctioning, retraction, instrument exchange, assisting with delivery with administration of fundal pressure, and for overall help during the procedure.   Anesthesia: epidural  Anesthesiologist: Peggye Delon Brunswick, MD   Antibiotics: Cefazolin  and Azithromycin    Estimated Blood Loss: 225 ml   Total IV Fluids: 100 ml  Urine Output:  200 cc OF clear urine  Specimens: Fallopian tubes   Complications: no complications   Indications: Shirley [name] is a 33 y.o. [id number] with an IUP [redacted]w[redacted]d presenting for unscheduled, emergent cesarean secondary to the indications listed above. Clinical course notable for progression to 4 cm. Prolonged deceleration resulting in co.  The risks of cesarean section were discussed with the patient including but were not limited to: bleeding which may require transfusion or reoperation; infection which may require antibiotics; injury to bowel, bladder, ureters or other surrounding organs; injury to the fetus; need for additional procedures including hysterectomy in the event of a life-threatening hemorrhage; placental abnormalities wth subsequent pregnancies, incisional problems, thromboembolic phenomenon and other postoperative/anesthesia complications.  Patient also desires permanent sterilization.  Other  reversible forms of contraception were discussed with patient; she declines all other modalities. Risks of procedure discussed with patient including but not limited to: risk of regret, permanence of method, bleeding, infection, injury to surrounding organs and need for additional procedures.  Failure risk of about 1% with increased risk of ectopic gestation if pregnancy occurs was also discussed with patient.  Also discussed possibility of post-tubal pain syndrome. The patient concurred with the proposed plan, giving informed written consent for the procedures.  Patient NPO status waived given urgency of case. Anesthesia and OR aware.  Preoperative prophylactic antibiotics and SCDs ordered on call to the OR.   Findings: Viable infant in cephalic presentation, nuchal x1. Apgars , , . Weight 1220 g. Clear amniotic fluid. Normal placenta, three vessel cord. Normal uterus, Normal bilateral fallopian tubes, Normal bilateral ovaries. No adhesive disease was encountered.  Procedure Details: A Time Out was skipped due to emergent case and the above information confirmed. The patient received intravenous antibiotics and had sequential compression devices applied to her lower extremities preoperatively. The patient was taken back to the operative suite where epidural anesthesia was administered. After induction of anesthesia, the patient was draped and prepped in the usual sterile manner and placed in a dorsal supine position with a leftward tilt. A low transverse skin incision was made with scalpel and carried down through the subcutaneous tissue to the fascia. Fascial incision was made and extended transversely. The fascia was separated from the underlying rectus tissue superiorly and inferiorly. The rectus muscles were separated in the midline bluntly and the peritoneum was entered bluntly. . A bladder flap was not developed. A low transverse uterine incision was made. The infant was successfully delivered from  cephalic presentation, the umbilical cord was clamped after 1 minute. Cord ph was sent, and cord blood was  obtained for evaluation. An Alexis retractor was placed to aid in visualization of the uterus. The placenta was removed Intact and appeared normal. The uterine incision was closed with a single layer running unlocked suture of 0-Monocryl. Due to ongoing bleeding from the hysterotomy figure of eight sutures of 0 Monocryl were placed after which there was excellent hemostasis.  Attention was then turned to the fallopian tubes. The left Fallopian tube was identified and then traced to it's fimbriae. Using the Ligasure device and taking care to avoid large vascular structures, the left fallopian tube was removed sequentially from the fimbriae to the cornua, with excellent hemostasis noted. Attention was then turned to the right fallopian tube, and after confirmation of identification by tracing the tube out to the fimbriae, the same procedure was then performed on with excellent hemostasis noted. .  The abdomen and pelvis were cleared of all clot and debris and the Thersia was removed. Hemostasis was confirmed on all surfaces.  The peritoneum was reapproximated using 2-0 vicryl . The fascia was then closed using 0 Vicryl in a running fashion. The subcutaneous layer was reapproximated with 2-0 plain gut suture. The skin was closed with a 4-0 vicryl subcuticular stitch. The patient tolerated the procedure well. Sponge, lap, instrument and needle counts were correct x 2. She was taken to the recovery room in stable condition.  Disposition: PACU - hemodynamically stable.    Signed: Norleen LULLA Rover, MD Attending Family Medicine Physician, [facility], [facility]

## 2023-07-07 NOTE — Consult Note (Signed)
 Advanced Heart Failure Team Consult Note   Primary Physician: Joshua Domino, DO Cardiologist:  None  Reason for Consultation: Hypertensive urgency and moderate MR  HPI:    Shirley [name] is seen today for evaluation of hypertensive urgency and moderate MR at the request of Dr. Barbra, [facility].   Shirley [name] is a 33 y.o. female with hypertension, hx preeclampsia in 2019  and obesity. She has a hx of preeclampsia in 2019 which required induction of pregnancy at 36 weeks. She has known HTN but has been off of meds for at least a year d/t cost. She was admitted 1/2 with hypertensive emergency refractory to IV antihypertensives. Also associated with R sided headache and nausea. BP 170/97> preeclamptic and was started on Mg 1/5. Yesterday she developed SOB with new oxygen requirements. Labs reviewed: K 3.7, SCr 0.94, HsTrop 152>140, Hgb 9.2, BNP 538. Echo 07/06/23 showed EF 50-55%, LV with no RWMA, RV normal, mild MR/TR.   Earlier today she was being induced for labor but had poor progression with worsening SOB and increased O2 requirements.  Baby started to show distress so she was taken for an emergency c-section. Post c-section she had persistent SOB and HTN.  She diuresed 5L yesterday with 40 IV lasix  Q6 and another 2L diuresed today. AHF team asked to see post c-section with increased oxygen requirements and hypertension.  On assessment patient is resting comfortably in bed. Denies CP. Says her breathing is feeling better. Currently on 30L HHFNC. BP 127/84 (97).    Home Medications Prior to Admission medications   Medication Sig Start Date End Date Taking? Authorizing Provider  Blood Pressure Monitoring DEVI 1 each by Does not apply route once a week. 03/10/23  Yes Cleatus Moccasin, MD  aspirin  EC 81 MG tablet Take 2 tablets (162 mg total) by mouth daily. Take after 12 weeks for prevention of preeclampsia later in pregnancy Patient not taking: Reported on 05/08/2023 04/07/23    Eldonna Suzen Octave, MD  cyclobenzaprine  (FLEXERIL ) 10 MG tablet Take 1 tablet (10 mg total) by mouth 3 (three) times daily as needed for muscle spasms. 12/20/22     ferrous sulfate  325 (65 FE) MG EC tablet Take 1 tablet (325 mg total) by mouth every other day. 03/14/23   Cleatus Moccasin, MD  labetalol  (NORMODYNE ) 200 MG tablet Take 2 tablets (400 mg total) by mouth 2 (two) times daily. 05/08/23   Fredirick Glenys RAMAN, MD  metFORMIN  (GLUCOPHAGE ) 500 MG tablet Take 1 tablet (500 mg total) by mouth daily in the evening with largest meal. Patient not taking: Reported on 03/10/2023 12/20/22     metroNIDAZOLE  (FLAGYL ) 500 MG tablet Take 1 tablet (500 mg total) by mouth 2 (two) times daily. 03/12/23   Cleatus Moccasin, MD  NIFEdipine  (ADALAT  CC) 30 MG 24 hr tablet Take 2 tablets (60 mg total) by mouth daily. 04/07/23   Eldonna Suzen Octave, MD  Prenatal Vit-Fe Fumarate-FA (PREPLUS) 27-1 MG TABS Take 1 tablet by mouth daily. 02/24/23   Jhonny Augustin BROCKS, MD    Past Medical History: Past Medical History:  Diagnosis Date   Anxiety and depression 10/05/2019   Arthritis    Assistance needed with transportation 11/22/2019   Chest pain    Chronic low back pain 01/28/2018   Constipation    Depression    Disorder of hip joint 02/16/2018   Edema, lower extremity    Elevated alkaline phosphatase level 10/05/2019   GERD (gastroesophageal reflux disease)  Hypertension    Joint pain    Low HDL (under 40) 10/05/2019   Neck pain 07/16/2019   Obesity    Osteoarthritis    Rheumatoid arthritis (HCC)    Sciatica    Shortness of breath    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   MRI (06/11/2016)  T11-T12: degenerative disc bulge and facet hypertrophy w/o significant stenosis  L4-L5: mild b/l facet arthrosis  L5-S1: mild b/l facet arthrosis, sm central disc protrusion w/o significant stenosis or frank neural impingement and mod b/l foraminal narrowing at L5-S1 related to disc bulge and facet disease,  right worse than left     Vitamin D  deficiency 10/05/2019    Past Surgical History: Past Surgical History:  Procedure Laterality Date   TOOTH EXTRACTION N/A 10/22/2013   Procedure: DENTAL EXTRACTIONS TEETH #1, 16, 17, 32;  Surgeon: Glendia CHRISTELLA Primrose, DDS;  Location: [facility] OR;  Service: Oral Surgery;  Laterality: N/A;    Family History: Family History  Problem Relation Age of Onset   Hypertension Mother    Depression Mother    Anxiety disorder Mother    Obesity Mother    Hypertension Father    Diabetes Maternal Grandmother    Diabetes Paternal Grandmother    Stroke Paternal Grandmother     Social History: Social History   Socioeconomic History   Marital status: Single    Spouse name: Not on file   Number of children: 3   Years of education: Not on file   Highest education level: Not on file  Occupational History   Occupation: stay at home mom  Tobacco Use   Smoking status: Former    Current packs/day: 0.00    Types: Cigarettes    Quit date: 01/29/2017    Years since quitting: 6.4   Smokeless tobacco: Never  Substance and Sexual Activity   Alcohol use: No    Alcohol/week: 0.0 standard drinks of alcohol    Comment: rare   Drug use: No    Types: Marijuana    Comment: not since confirmed pregnancy   Sexual activity: Yes    Birth control/protection: None  Other Topics Concern   Not on file  Social History Narrative   Not on file   Social Drivers of Health   Financial Resource Strain: Not on file  Food Insecurity: No Food Insecurity (07/06/2023)   Hunger Vital Sign    Worried About Running Out of Food in the Last Year: Never true    Ran Out of Food in the Last Year: Never true  Recent Concern: Food Insecurity - Food Insecurity Present (07/03/2023)   Hunger Vital Sign    Worried About Running Out of Food in the Last Year: Never true    Ran Out of Food in the Last Year: Sometimes true  Transportation Needs: No Transportation Needs (07/06/2023)   PRAPARE - Therapist, Art (Medical): No    Lack of Transportation (Non-Medical): No  Recent Concern: Transportation Needs - Unmet Transportation Needs (07/03/2023)   PRAPARE - Administrator, Civil Service (Medical): Yes    Lack of Transportation (Non-Medical): Yes  Physical Activity: Not on file  Stress: Not on file  Social Connections: Not on file    Allergies:  No Known Allergies  Objective:    Vital Signs:   Temp:  [96.9 F (36.1 C)-98.5 F (36.9 C)] 96.9 F (36.1 C) (01/06 1445) Pulse Rate:  [54-85] 56 (01/06 1445) Resp:  [  12-22] 12 (01/06 1445) BP: (90-207)/(60-124) 93/65 (01/06 1445) SpO2:  [86 %-100 %] 98 % (01/06 1500) FiO2 (%):  [90 %-100 %] 90 % (01/06 1508)    Weight change: There were no vitals filed for this visit.  Intake/Output:   Intake/Output Summary (Last 24 hours) at 07/07/2023 1531 Last data filed at 07/07/2023 1406 Gross per 24 hour  Intake 895.3 ml  Output 7600 ml  Net -6704.7 ml    Physical Exam    General:  drowsy appearing.   HEENT: +HHFNC 30L Neck: supple. JVD difficult to see. Carotids 2+ bilat; no bruits. No lymphadenopathy or thyromegaly appreciated. Cor: PMI nondisplaced. Regular rate & rhythm. No rubs, gallops or murmurs. Lungs: diminished Abdomen: soft, nontender, nondistended. No hepatosplenomegaly. No bruits or masses. Good bowel sounds. Extremities: no cyanosis, clubbing, rash, edema  Neuro: drowsy, follows commands   Telemetry   NSR 60 (Personally reviewed)    EKG    NSR 75 bpm   Labs   Basic Metabolic Panel: Recent Labs  Lab 07/03/23 1251 07/06/23 1849 07/07/23 0520  NA 135 134* 134*  K 3.4* 3.7 4.1  CL 101 106 103  CO2 23 20* 21*  GLUCOSE 104* 92 93  BUN 11 18 16   CREATININE 1.02* 0.94 0.97  CALCIUM  8.5* 8.4* 8.3*  MG  --   --  3.6*    Liver Function Tests: Recent Labs  Lab 07/03/23 1251 07/06/23 1849 07/07/23 0520  AST 14* 21 31  ALT 8 16 26   ALKPHOS 111 103 110  BILITOT 0.4 0.4 0.5   PROT 6.3* 5.7* 6.6  ALBUMIN 1.9* 1.9* 2.2*   No results for input(s): LIPASE, AMYLASE in the last 168 hours. No results for input(s): AMMONIA in the last 168 hours.  CBC: Recent Labs  Lab 07/03/23 1251 07/06/23 1849 07/07/23 0520 07/07/23 1456  WBC 10.7* 15.1* 16.2* 14.1*  HGB 10.8* 9.2* 11.2* 9.3*  HCT 33.7* 28.8* 34.3* 28.9*  MCV 84.3 86.0 84.3 84.8  PLT 264 221 257 220    Cardiac Enzymes: No results for input(s): CKTOTAL, CKMB, CKMBINDEX, TROPONINI in the last 168 hours.  BNP: BNP (last 3 results) Recent Labs    07/06/23 1849  BNP 538.6*    ProBNP (last 3 results) No results for input(s): PROBNP in the last 8760 hours.   CBG: No results for input(s): GLUCAP in the last 168 hours.  Coagulation Studies: No results for input(s): LABPROT, INR in the last 72 hours.   Imaging   DG CHEST PORT 1 VIEW Result Date: 07/06/2023 CLINICAL DATA:  [id number] Dyspnea [id number] EXAM: PORTABLE CHEST - 1 VIEW COMPARISON:  07/03/2023 FINDINGS: Coarse perihilar and bibasilar interstitial opacities, new since previous. Heart size upper limits normal. No effusion. Visualized bones unremarkable. IMPRESSION: New  perihilar and bibasilar infiltrates or edema. Electronically Signed   By: JONETTA Faes M.D.   On: 07/06/2023 18:37   ECHOCARDIOGRAM COMPLETE Result Date: 07/06/2023    ECHOCARDIOGRAM REPORT   Patient Name:   Shirley [name] Date of Exam: 07/06/2023 Medical Rec #:  [id number]        Height:       67.0 in Accession #:    [id number]       Weight:       305.3 lb Date of Birth:  July 04, 1990        BSA:          2.421 m Patient Age:    32 years  BP:           157/84 mmHg Patient Gender: F                HR:           72 bpm. Exam Location:  Inpatient Procedure: 2D Echo, Cardiac Doppler and Color Doppler Indications:    Cardiomegaly I51.7  History:        Patient has no prior history of Echocardiogram examinations.                 Signs/Symptoms:Chest Pain and Shortness of  Breath; Risk                 Factors:Hypertension.  Sonographer:    Thea Norlander RCS Referring Phys: [id number] CHARLIE PICKENS IMPRESSIONS  1. Left ventricular ejection fraction, by estimation, is 50 to 55%. The left ventricle has low normal function. The left ventricle has no regional wall motion abnormalities. There is mild asymmetric left ventricular hypertrophy of the septal segment. Left ventricular diastolic parameters were normal.  2. Right ventricular systolic function is normal. The right ventricular size is normal. Tricuspid regurgitation signal is inadequate for assessing PA pressure.  3. The mitral valve is grossly normal. Mild mitral valve regurgitation.  4. The aortic valve is tricuspid. Aortic valve regurgitation is not visualized.  5. Aortic dilatation noted. There is mild dilatation of the ascending aorta, measuring 38 mm.  6. The inferior vena cava is normal in size with greater than 50% respiratory variability, suggesting right atrial pressure of 3 mmHg. Comparison(s): No prior Echocardiogram. FINDINGS  Left Ventricle: Left ventricular ejection fraction, by estimation, is 50 to 55%. The left ventricle has low normal function. The left ventricle has no regional wall motion abnormalities. The left ventricular internal cavity size was normal in size. There is mild asymmetric left ventricular hypertrophy of the septal segment. Left ventricular diastolic parameters were normal. Right Ventricle: The right ventricular size is normal. No increase in right ventricular wall thickness. Right ventricular systolic function is normal. Tricuspid regurgitation signal is inadequate for assessing PA pressure. Left Atrium: Left atrial size was normal in size. Right Atrium: Right atrial size was normal in size. Pericardium: There is no evidence of pericardial effusion. Mitral Valve: The mitral valve is grossly normal. Mild mitral valve regurgitation. Tricuspid Valve: The tricuspid valve is grossly normal. Tricuspid  valve regurgitation is mild. Aortic Valve: The aortic valve is tricuspid. Aortic valve regurgitation is not visualized. Aortic valve peak gradient measures 7.7 mmHg. Pulmonic Valve: The pulmonic valve was grossly normal. Pulmonic valve regurgitation is mild. Aorta: Aortic dilatation noted. There is mild dilatation of the ascending aorta, measuring 38 mm. Venous: The inferior vena cava is normal in size with greater than 50% respiratory variability, suggesting right atrial pressure of 3 mmHg. IAS/Shunts: No atrial level shunt detected by color flow Doppler.  LEFT VENTRICLE PLAX 2D LVIDd:         5.60 cm   Diastology LVIDs:         4.00 cm   LV e' medial:    8.03 cm/s LV PW:         0.60 cm   LV E/e' medial:  13.6 LV IVS:        1.10 cm   LV e' lateral:   6.65 cm/s LVOT diam:     2.30 cm   LV E/e' lateral: 16.4 LV SV:         91 LV SV Index:   37 LVOT  Area:     4.15 cm  RIGHT VENTRICLE             IVC RV S prime:     14.90 cm/s  IVC diam: 1.90 cm TAPSE (M-mode): 2.6 cm LEFT ATRIUM             Index        RIGHT ATRIUM           Index LA diam:        4.80 cm 1.98 cm/m   RA Area:     18.60 cm LA Vol (A2C):   56.9 ml 23.51 ml/m  RA Volume:   46.70 ml  19.29 ml/m LA Vol (A4C):   57.1 ml 23.59 ml/m LA Biplane Vol: 62.3 ml 25.74 ml/m  AORTIC VALVE AV Area (Vmax): 3.33 cm AV Vmax:        139.00 cm/s AV Peak Grad:   7.7 mmHg LVOT Vmax:      111.33 cm/s LVOT Vmean:     72.500 cm/s LVOT VTI:       0.218 m  AORTA Ao Root diam: 3.50 cm Ao Asc diam:  3.80 cm MITRAL VALVE MV Area (PHT): 4.31 cm     SHUNTS MV Decel Time: 176 msec     Systemic VTI:  0.22 m MR Peak grad: 88.1 mmHg     Systemic Diam: 2.30 cm MR Vmax:      469.33 cm/s MV E velocity: 109.00 cm/s MV A velocity: 65.20 cm/s MV E/A ratio:  1.67 Jayson Sierras MD Electronically signed by Jayson Sierras MD Signature Date/Time: 07/06/2023/5:56:52 PM    Final     Medications:   Current Medications:  [MAR Hold] ferrous sulfate   325 mg Oral QODAY   [MAR Hold]  furosemide   40 mg Intravenous Q6H   [MAR Hold] hydrALAZINE   50 mg Oral Q6H   [MAR Hold] labetalol   600 mg Oral Q8H   [MAR Hold] NIFEdipine   30 mg Oral BID   [MAR Hold] oxytocin  40 units in LR 1000 mL  333 mL Intravenous Once   [MAR Hold] pantoprazole   40 mg Oral BID   [MAR Hold] polyethylene glycol  17 g Oral BID   [MAR Hold] prenatal multivitamin  1 tablet Oral Q1200   [MAR Hold] sodium chloride  flush  3-10 mL Intravenous Q12H    Infusions:  fentaNYL  2 mcg/mL w/bupivacaine  0.125% in NS 250 mL Stopped (07/07/23 1310)   [MAR Hold] lactated ringers      [MAR Hold] lactated ringers      lactated ringers      lactated ringers  10 mL/hr at 07/07/23 0901   magnesium  sulfate 1 g/hr (07/07/23 0901)   oxytocin      [MAR Hold] oxytocin  Stopped (07/07/23 1307)   [MAR Hold] pencillin G potassium IV 3 Million Units (07/07/23 0954)    Patient Profile   Shirley [name] is a 33 y.o. female with hypertension, hx preeclampsia in 2019 and obesity. Admitted 07/03/23 with hypertensive emergency. Now s/p emergency c-section and tubal ligation 07/07/23. AHF team to see   Assessment/Plan   Hypertensive emergency>> preeclampsia - s/p emergency c-section and tubal ligation 07/07/23. OB following - Continue antihypertensives labetalol  600 mg Q8 and nifedipine  30 mg BID.  - SBP 120s on assessment, stable - Echo showed EF 50-55%, normal RV, mild MR (read as Mod MR per Dr. Gardenia) - Continue Mg for 24 hrs per OB - Change lasix  to 80 mg BID, follow diuresis   MR - Moderate MR on echo -  May improve with diuresis, suspect repeat limited echo once diuresed - diuresis as above  Acute respiratory distress - Per CCM - Suspect flash pulmonary edema with mod MR - Continue diuresis - Currently on HHFNC 30L, plan to wean to HFNC once on unit  Elevated troponin's - HsTrop 152>140 - Denies CP - Suspect demand ischemia with volume overload.   Length of Stay: 4  Shirley LITTIE Coe, NP  07/07/2023, 3:31 PM  Advanced  Heart Failure Team Pager [phone] (M-F; 7a - 5p)  Please contact [facility] for night-coverage after hours (4p -7a ) and weekends on [email or web address]

## 2023-07-07 NOTE — Anesthesia Postprocedure Evaluation (Signed)
 Anesthesia Post Note  Patient: Jennavie M Ober  Procedure(s) Performed: CESAREAN SECTION WITH BILATERAL TUBAL LIGATION BILATERAL TUBAL LIGATION     Patient location during evaluation: PACU Anesthesia Type: Epidural Level of consciousness: awake Pain management: pain level controlled Vital Signs Assessment: post-procedure vital signs reviewed and stable Respiratory status: spontaneous breathing, nonlabored ventilation and respiratory function stable Cardiovascular status: stable Postop Assessment: no headache, no backache and epidural receding Anesthetic complications: no Comments: Patient back on HFNC as she was preop. Patient to go to ICU.   No notable events documented.  Last Vitals:  Vitals:   07/07/23 1645 07/07/23 1700  BP:    Pulse: 74 68  Resp:  15  Temp:  36.4 C  SpO2: 91% 94%    Last Pain:  Vitals:   07/07/23 1700  TempSrc: Oral  PainSc: 0-No pain                 Delon Aisha Arch

## 2023-07-07 NOTE — Anesthesia Preprocedure Evaluation (Signed)
 Anesthesia Evaluation  Patient identified by MRN, date of birth, ID band Patient awake    Reviewed: Allergy & Precautions, NPO status , Patient's Chart, lab work & pertinent test results  History of Anesthesia Complications Negative for: history of anesthetic complications  Airway Mallampati: III  TM Distance: >3 FB Neck ROM: Full    Dental   Pulmonary shortness of breath, former smoker   Pulmonary exam normal breath sounds clear to auscultation       Cardiovascular hypertension, Pt. on medications and Pt. on home beta blockers  Rhythm:Regular Rate:Normal  TTE 07/06/2023: IMPRESSIONS     1. Left ventricular ejection fraction, by estimation, is 50 to 55%. The  left ventricle has low normal function. The left ventricle has no regional  wall motion abnormalities. There is mild asymmetric left ventricular  hypertrophy of the septal segment.  Left ventricular diastolic parameters were normal.   2. Right ventricular systolic function is normal. The right ventricular  size is normal. Tricuspid regurgitation signal is inadequate for assessing  PA pressure.   3. The mitral valve is grossly normal. Mild mitral valve regurgitation.   4. The aortic valve is tricuspid. Aortic valve regurgitation is not  visualized.   5. Aortic dilatation noted. There is mild dilatation of the ascending  aorta, measuring 38 mm.   6. The inferior vena cava is normal in size with greater than 50%  respiratory variability, suggesting right atrial pressure of 3 mmHg.     Neuro/Psych neg Seizures PSYCHIATRIC DISORDERS Anxiety Depression     Neuromuscular disease (chronic low back pain)    GI/Hepatic Neg liver ROS,GERD  ,,  Endo/Other  neg diabetes  Class 4 obesity  Renal/GU negative Renal ROS     Musculoskeletal  (+) Arthritis , Osteoarthritis and Rheumatoid disorders,    Abdominal  (+) + obese  Peds  Hematology  (+) Blood dyscrasia (alpha  thalassemia silent carrier), anemia Lab Results      Component                Value               Date                      WBC                      16.2 (H)            07/07/2023                HGB                      11.2 (L)            07/07/2023                HCT                      34.3 (L)            07/07/2023                MCV                      84.3                07/07/2023                PLT  257                 07/07/2023              Anesthesia Other Findings [id number] at [redacted]w[redacted]d admitted for hypertensive emergency 1/2 with known cHTN. Developed O2 need, SOB today. Found to have cardiomyopathy and marked fluid overload. Now on L&D for IOL.  Reproductive/Obstetrics                              Anesthesia Physical Anesthesia Plan  ASA: 4  Anesthesia Plan: Epidural   Post-op Pain Management:    Induction:   PONV Risk Score and Plan:   Airway Management Planned: Natural Airway  Additional Equipment:   Intra-op Plan:   Post-operative Plan:   Informed Consent: I have reviewed the patients History and Physical, chart, labs and discussed the procedure including the risks, benefits and alternatives for the proposed anesthesia with the patient or authorized representative who has indicated his/her understanding and acceptance.       Plan Discussed with: Anesthesiologist  Anesthesia Plan Comments: (I have discussed risks of neuraxial anesthesia including but not limited to infection, bleeding, nerve injury, back pain, headache, seizures, and failure of block. Patient denies bleeding disorders and is not currently anticoagulated. Labs have been reviewed. Risks and benefits discussed. All patient's questions answered.  )         Anesthesia Quick Evaluation

## 2023-07-07 NOTE — Progress Notes (Addendum)
 eLink Physician-Brief Progress Note Patient Name: Shirley [name] DOB: 1991/01/29 MRN: [id number]   Date of Service  07/07/2023  HPI/Events of Note  33 year old woman with a history of uncontrolled hypertension and previous preeclampsia who presented on 1/2 with progressive shortness of breath and elevated blood pressures.  Here for preeclampsia with flash pulmonary edema  Asked by team whether or not to give scheduled labetalol   eICU Interventions  Would maintain scheduled medications as currently ordered   0245 -refractory hypertension despite all antihypertensives.  Will add hydralazine  x 1.  Intervention Category Intermediate Interventions: Hypertension - evaluation and management  Ailany Koren 07/07/2023, 9:11 PM

## 2023-07-07 NOTE — Progress Notes (Signed)
 LABOR PROGRESS NOTE  Patient Name: Shirley [name], female   DOB: 04-Mar-1991, 33 y.o.  MRN: [id number]  [id number] at [redacted]w[redacted]d admitted for hypertensive emergency 1/2 with known cHTN. Developed O2 need, SOB today. Found to have cardiomyopathy and marked fluid overload. Now on L&D for IOL.  S: Called to patients room due to prolonged deceleration   O:  BP 137/85   Pulse (!) 59   Temp (!) 97.5 F (36.4 C) (Oral)   Resp 20   Ht 5' 7 (1.702 m)   LMP 12/26/2022   SpO2 100%   Breastfeeding Unknown   BMI 47.82 kg/m   CVE: Dilation: 3.5 Effacement (%): 50 Cervical Position: Posterior Station: Ballotable Presentation: Vertex Exam by:: Bodee Lafoe MD   A&P:   #Labor: called to patients room. On arrival FHR 50-60 BMP. SVE 4/thick/-3. Discontinued pitocin  and attempted position changes.  FHR improved to 90s but returned to 60s. Whie monitoring for improvement in FHR patient consented for cesarean delivery. Patient also requesting tubal ligation if she needs surgery. Approximately 6 minutes into prolonged deceleration CODE cesarean called.   The risks of cesarean section were discussed with the patient including but were not limited to: bleeding which may require transfusion or reoperation; infection which may require antibiotics; injury to bowel, bladder, ureters or other surrounding organs; injury to the fetus; need for additional procedures including hysterectomy in the event of a life-threatening hemorrhage; placental abnormalities wth subsequent pregnancies, incisional problems, thromboembolic phenomenon and other postoperative/anesthesia complications.  Patient also desires permanent sterilization.  Other reversible forms of contraception were discussed with patient; she declines all other modalities. Risks of procedure discussed with patient including but not limited to: risk of regret, permanence of method, bleeding, infection, injury to surrounding organs and need for additional procedures.   Failure risk of about 1% with increased risk of ectopic gestation if pregnancy occurs was also discussed with patient.  Also discussed possibility of post-tubal pain syndrome. The patient concurred with the proposed plan, giving informed written consent for the procedures.  Patient NPO status waived given urgency of case. Anesthesia and OR aware.  Preoperative prophylactic antibiotics and SCDs ordered on call to the OR.   #Pain: epidural in place  #FWB: CAT 3 #GBS  unknown > PCN  #Cardiomyopathy: Catheter in place for strict I's&O's. Lasix  40 mg IV Q6. Oxygen PRN for O2 >95%. Labs Q12. Tele. Started morphine  2mg  Q1 PRN for air hunger/pain management in place of fentanyl .   #cHTN w/SIPE: Labetalol  600 Q8, Procardia  XL 30 BID, hydralazine  50 mg Q6. Mag 4 g bolus/1 g/hr given Cr 0.9. PRN hydralazine  for severe range.  Shirley Neale V Pakou Rainbow, MD [facility], [facility] 07/07/23  2:10 PM

## 2023-07-07 NOTE — Transfer of Care (Signed)
 Immediate Anesthesia Transfer of Care Note  Patient: Shirley [name]  Procedure(s) Performed: CESAREAN SECTION  Patient Location: PACU  Anesthesia Type:Epidural  Level of Consciousness: awake, alert , and oriented  Airway & Oxygen Therapy: Patient Spontanous Breathing and Patient connected to nasal cannula oxygen  Post-op Assessment: Report given to RN and Post -op Vital signs reviewed and stable  Post vital signs: Reviewed and stable  Last Vitals:  Vitals Value Taken Time  BP 88/55 07/07/23 1416  Temp 36.1 C 07/07/23 1414  Pulse 54 07/07/23 1418  Resp 14 07/07/23 1418  SpO2 88 % 07/07/23 1418  Vitals shown include unfiled device data.  Last Pain:  Vitals:   07/07/23 1414  TempSrc: Temporal  PainSc:          Complications: No notable events documented.

## 2023-07-08 ENCOUNTER — Inpatient Hospital Stay (HOSPITAL_COMMUNITY): Payer: Medicaid Other

## 2023-07-08 DIAGNOSIS — R0989 Other specified symptoms and signs involving the circulatory and respiratory systems: Secondary | ICD-10-CM | POA: Diagnosis not present

## 2023-07-08 DIAGNOSIS — R918 Other nonspecific abnormal finding of lung field: Secondary | ICD-10-CM | POA: Diagnosis not present

## 2023-07-08 DIAGNOSIS — I10 Essential (primary) hypertension: Secondary | ICD-10-CM

## 2023-07-08 LAB — CBC WITH DIFFERENTIAL/PLATELET
Abs Immature Granulocytes: 0.11 10*3/uL — ABNORMAL HIGH (ref 0.00–0.07)
Abs Immature Granulocytes: 0.1 10*3/uL — ABNORMAL HIGH (ref 0.00–0.07)
Basophils Absolute: 0.1 10*3/uL (ref 0.0–0.1)
Basophils Absolute: 0 10*3/uL (ref 0.0–0.1)
Basophils Relative: 0 %
Basophils Relative: 0 %
Eosinophils Absolute: 0.1 10*3/uL (ref 0.0–0.5)
Eosinophils Absolute: 0.1 10*3/uL (ref 0.0–0.5)
Eosinophils Relative: 1 %
Eosinophils Relative: 1 %
HCT: 32.8 % — ABNORMAL LOW (ref 36.0–46.0)
HCT: 33.9 % — ABNORMAL LOW (ref 36.0–46.0)
Hemoglobin: 10.5 g/dL — ABNORMAL LOW (ref 12.0–15.0)
Hemoglobin: 10.9 g/dL — ABNORMAL LOW (ref 12.0–15.0)
Immature Granulocytes: 1 %
Immature Granulocytes: 1 %
Lymphocytes Relative: 13 %
Lymphocytes Relative: 14 %
Lymphs Abs: 2.3 10*3/uL (ref 0.7–4.0)
Lymphs Abs: 2.2 10*3/uL (ref 0.7–4.0)
MCH: 27.6 pg (ref 26.0–34.0)
MCH: 27.5 pg (ref 26.0–34.0)
MCHC: 32 g/dL (ref 30.0–36.0)
MCHC: 32.2 g/dL (ref 30.0–36.0)
MCV: 86.3 fL (ref 80.0–100.0)
MCV: 85.4 fL (ref 80.0–100.0)
Monocytes Absolute: 0.8 10*3/uL (ref 0.1–1.0)
Monocytes Absolute: 0.7 10*3/uL (ref 0.1–1.0)
Monocytes Relative: 5 %
Monocytes Relative: 5 %
Neutro Abs: 14.6 10*3/uL — ABNORMAL HIGH (ref 1.7–7.7)
Neutro Abs: 12.4 10*3/uL — ABNORMAL HIGH (ref 1.7–7.7)
Neutrophils Relative %: 80 %
Neutrophils Relative %: 79 %
Platelets: 263 10*3/uL (ref 150–400)
Platelets: 263 10*3/uL (ref 150–400)
RBC: 3.8 MIL/uL — ABNORMAL LOW (ref 3.87–5.11)
RBC: 3.97 MIL/uL (ref 3.87–5.11)
RDW: 16.1 % — ABNORMAL HIGH (ref 11.5–15.5)
RDW: 16.1 % — ABNORMAL HIGH (ref 11.5–15.5)
WBC: 17.9 10*3/uL — ABNORMAL HIGH (ref 4.0–10.5)
WBC: 15.6 10*3/uL — ABNORMAL HIGH (ref 4.0–10.5)
nRBC: 0.3 % — ABNORMAL HIGH (ref 0.0–0.2)
nRBC: 0.3 % — ABNORMAL HIGH (ref 0.0–0.2)

## 2023-07-08 LAB — COMPREHENSIVE METABOLIC PANEL
ALT: 35 U/L (ref 0–44)
ALT: 30 U/L (ref 0–44)
AST: 37 U/L (ref 15–41)
AST: 32 U/L (ref 15–41)
Albumin: 2.1 g/dL — ABNORMAL LOW (ref 3.5–5.0)
Albumin: 2.1 g/dL — ABNORMAL LOW (ref 3.5–5.0)
Alkaline Phosphatase: 111 U/L (ref 38–126)
Alkaline Phosphatase: 106 U/L (ref 38–126)
Anion gap: 10 (ref 5–15)
Anion gap: 10 (ref 5–15)
BUN: 20 mg/dL (ref 6–20)
BUN: 20 mg/dL (ref 6–20)
CO2: 25 mmol/L (ref 22–32)
CO2: 25 mmol/L (ref 22–32)
Calcium: 7.7 mg/dL — ABNORMAL LOW (ref 8.9–10.3)
Calcium: 7.3 mg/dL — ABNORMAL LOW (ref 8.9–10.3)
Chloride: 97 mmol/L — ABNORMAL LOW (ref 98–111)
Chloride: 100 mmol/L (ref 98–111)
Creatinine, Ser: 1.11 mg/dL — ABNORMAL HIGH (ref 0.44–1.00)
Creatinine, Ser: 1.2 mg/dL — ABNORMAL HIGH (ref 0.44–1.00)
GFR, Estimated: >60 mL/min (ref >60)
GFR, Estimated: >60 mL/min (ref >60)
Glucose, Bld: 88 mg/dL (ref 70–99)
Glucose, Bld: 96 mg/dL (ref 70–99)
Potassium: 4.4 mmol/L (ref 3.5–5.1)
Potassium: 4.1 mmol/L (ref 3.5–5.1)
Sodium: 132 mmol/L — ABNORMAL LOW (ref 135–145)
Sodium: 135 mmol/L (ref 135–145)
Total Bilirubin: 0.4 mg/dL (ref 0.0–1.2)
Total Bilirubin: 0.4 mg/dL (ref 0.0–1.2)
Total Protein: 6.4 g/dL — ABNORMAL LOW (ref 6.5–8.1)
Total Protein: 6.6 g/dL (ref 6.5–8.1)

## 2023-07-08 MED ORDER — NIFEDIPINE ER OSMOTIC RELEASE 60 MG PO TB24
60.0000 mg | ORAL_TABLET | Freq: Two times a day (BID) | ORAL | Status: DC
  Administered 2023-07-09 – 2023-07-10 (×3): 60 mg via ORAL
  Filled 2023-07-08 (×3): qty 1

## 2023-07-08 MED ORDER — LOSARTAN POTASSIUM 50 MG PO TABS: 100.0000 mg | ORAL_TABLET | Freq: Every day | ORAL | Status: DC

## 2023-07-08 MED ORDER — HYDRALAZINE HCL 20 MG/ML IJ SOLN
10.0000 mg | Freq: Once | INTRAMUSCULAR | Status: AC
  Administered 2023-07-08: 10 mg via INTRAVENOUS
  Filled 2023-07-08: qty 1

## 2023-07-08 MED ORDER — NIFEDIPINE ER OSMOTIC RELEASE 30 MG PO TB24
30.0000 mg | ORAL_TABLET | Freq: Once | ORAL | Status: AC
  Administered 2023-07-08: 30 mg via ORAL
  Filled 2023-07-08: qty 1

## 2023-07-08 MED ORDER — CARVEDILOL 25 MG PO TABS
12.5000 mg | ORAL_TABLET | Freq: Two times a day (BID) | ORAL | Status: DC
  Administered 2023-07-08 – 2023-07-09 (×2): 12.5 mg via ORAL
  Filled 2023-07-08 (×2): qty 1

## 2023-07-08 MED ORDER — DIPHENHYDRAMINE HCL 25 MG PO CAPS
25.0000 mg | ORAL_CAPSULE | Freq: Once | ORAL | Status: AC
  Administered 2023-07-08: 25 mg via ORAL
  Filled 2023-07-08: qty 1

## 2023-07-08 NOTE — Plan of Care (Signed)
 Problem: Education: Goal: Knowledge of disease or condition will improve Outcome: Progressing Goal: Knowledge of the prescribed therapeutic regimen will improve Outcome: Progressing   Problem: Fluid Volume: Goal: Peripheral tissue perfusion will improve Outcome: Progressing   Problem: Clinical Measurements: Goal: Complications related to disease process, condition or treatment will be avoided or minimized Outcome: Progressing   Problem: Education: Goal: Knowledge of disease or condition will improve Outcome: Progressing Goal: Knowledge of the prescribed therapeutic regimen will improve Outcome: Progressing   Problem: Fluid Volume: Goal: Peripheral tissue perfusion will improve Outcome: Progressing   Problem: Clinical Measurements: Goal: Complications related to disease process, condition or treatment will be avoided or minimized Outcome: Progressing   Problem: Education: Goal: Knowledge of disease or condition will improve Outcome: Progressing Goal: Knowledge of the prescribed therapeutic regimen will improve Outcome: Progressing Goal: Individualized Educational Video(s) Outcome: Progressing   Problem: Clinical Measurements: Goal: Complications related to the disease process, condition or treatment will be avoided or minimized Outcome: Progressing   Problem: Education: Goal: Knowledge of General Education information will improve Description: Including pain rating scale, medication(s)/side effects and non-pharmacologic comfort measures Outcome: Progressing   Problem: Health Behavior/Discharge Planning: Goal: Ability to manage health-related needs will improve Outcome: Progressing   Problem: Clinical Measurements: Goal: Ability to maintain clinical measurements within normal limits will improve Outcome: Progressing Goal: Will remain free from infection Outcome: Progressing Goal: Diagnostic test results will improve Outcome: Progressing Goal: Respiratory  complications will improve Outcome: Progressing Goal: Cardiovascular complication will be avoided Outcome: Progressing   Problem: Activity: Goal: Risk for activity intolerance will decrease Outcome: Progressing   Problem: Nutrition: Goal: Adequate nutrition will be maintained Outcome: Progressing   Problem: Coping: Goal: Level of anxiety will decrease Outcome: Progressing   Problem: Elimination: Goal: Will not experience complications related to bowel motility Outcome: Progressing Goal: Will not experience complications related to urinary retention Outcome: Progressing   Problem: Pain Management: Goal: General experience of comfort will improve Outcome: Progressing   Problem: Safety: Goal: Ability to remain free from injury will improve Outcome: Progressing   Problem: Skin Integrity: Goal: Risk for impaired skin integrity will decrease Outcome: Progressing   Problem: Education: Goal: Knowledge of the prescribed therapeutic regimen will improve Outcome: Progressing Goal: Understanding of sexual limitations or changes related to disease process or condition will improve Outcome: Progressing Goal: Individualized Educational Video(s) Outcome: Progressing   Problem: Self-Concept: Goal: Communication of feelings regarding changes in body function or appearance will improve Outcome: Progressing   Problem: Skin Integrity: Goal: Demonstration of wound healing without infection will improve Outcome: Progressing   Problem: Education: Goal: Knowledge of Childbirth will improve Outcome: Progressing Goal: Ability to make informed decisions regarding treatment and plan of care will improve Outcome: Progressing Goal: Ability to state and carry out methods to decrease the pain will improve Outcome: Progressing Goal: Individualized Educational Video(s) Outcome: Progressing   Problem: Coping: Goal: Ability to verbalize concerns and feelings about labor and delivery will  improve Outcome: Progressing   Problem: Life Cycle: Goal: Ability to make normal progression through stages of labor will improve Outcome: Progressing Goal: Ability to effectively push during vaginal delivery will improve Outcome: Progressing   Problem: Role Relationship: Goal: Will demonstrate positive interactions with the child Outcome: Progressing   Problem: Safety: Goal: Risk of complications during labor and delivery will decrease Outcome: Progressing   Problem: Pain Management: Goal: Relief or control of pain from uterine contractions will improve Outcome: Progressing   Problem: Education: Goal: Knowledge of condition will improve  Outcome: Progressing Goal: Individualized Educational Video(s) Outcome: Progressing Goal: Individualized Newborn Educational Video(s) Outcome: Progressing   Problem: Activity: Goal: Will verbalize the importance of balancing activity with adequate rest periods Outcome: Progressing Goal: Ability to tolerate increased activity will improve Outcome: Progressing

## 2023-07-08 NOTE — Progress Notes (Signed)
 Received msg from CHF APP to reassign to gen cards for AM rounds, moved to team A.

## 2023-07-08 NOTE — Progress Notes (Signed)
 Post Partum Day 1 s/p pLTCS & BS for NRFHT Subjective: Feeling improved. Reports breathing feels easier. Pain controlled. Foley in place. Not yet OOB. Denies CP, HA, vis changes, RUQ/epigastric pain.   Objective: Blood pressure (!) 158/103, pulse 63, temperature 98.1 F (36.7 C), temperature source Oral, resp. rate 17, height 5' 7 (1.702 m), last menstrual period 12/26/2022, SpO2 100%, unknown if currently breastfeeding.  Physical Exam:  General: alert, cooperative, and no distress. On 16L HHFNC Lochia: appropriate Uterine Fundus: firm Incision: pressure dressing in place, dried blood on dressing DVT Evaluation: No evidence of DVT seen on physical exam. Minimal pedal edema  Recent Labs    07/07/23 2016 07/08/23 0717  HGB 10.7* 10.5*  HCT 33.4* 32.8*  Appropriate for EBL  Assessment/Plan: Postpartum/Routine Post Op - Usual pain regimen for CS: scheduled tylenol  1g q6h, toradol  30mg  q6h x 24h followed by scheduled ibuprofen , oxycodone  5-10mg  prn - Advance diet as tolerated - Pressure dressing can be removed 24-48 hours post op. Underlying honeycomb dressing to remain in place until POD5-7 - Foley per primary. Typically removed POD1 for CS, but reasonable to continue for strict I/Os - Ambulate as cardiopulmonary status will allow - Contraception: s/p BS - MOF: formula - Rh status: Rh+ - Rubella status: RI  2. Superimposed preeclampsia with severe features, peripartum cardiomyopathy - Continue magnesium  4/1 x 24 hours post op - Consider postpartum BP goal < 140/90, typically use IV antihypertensives for BP > 160/110 - Current antihypertensives: labetalol  400mg  q8h & nifedipine  30mg  BID - Diuresis per primary  3. Neonatal - in NICU, will address circ at later date  Appreciate care by CVICU team. Will continue to follow along. Please call [phone] with any questions or concerns M-F 8-5p or [phone] after 5p or on weekends.    LOS: 5 days   Shirley [name] 07/08/2023, 8:22 AM

## 2023-07-08 NOTE — Progress Notes (Signed)
 RT took patient off HHFNC. Patient tolerating RA very well at this time. No distress noted at this time. RT will continue to monitor as needed.

## 2023-07-08 NOTE — Consult Note (Signed)
 NAME:  Shirley [name], MRN:  [id number], DOB:  1991/01/29, LOS: 5 ADMISSION DATE:  07/03/2023 CONSULTATION DATE:  07/07/2023 REFERRING MD:  Barbra - OB CHIEF COMPLAINT:  Pulmonary edema, preeclampsia   History of Present Illness:  33 year old woman [id number] who presented to [facility] 2H ICU as a transfer from [facility] for pulmonary edema in the setting of preeclampsia. PMHx significant for uncontrolled HTN (noncompliant with medications, also with history of preeclampsia requiring induction at 36 weeks in 2019), prediabetes, GERD, chronic LBP, anxiety/depression.  Initially presented to [facility] 1/2 for headache, nausea in the setting of uncontrolled HTN refractory to IV medications/superimposed preeclampsia. At time of presentation, patient was 100w0d and hypertensive with SBP 170s-180s. Multiple PO/IV medications (labetalol , nifedipine , hydralazine ) given with some improvement. Patient was symptomatically improving with plan for discharge 1/5 until she became acutely SOB with new O2 requirement of 3L. Echo was obtained demonstrating EF 50-55%, low normal LV function without RWMAs and normal diastolic function; mild to moderate MR was noted. Troponin was noted to be elevated to 152 and BNP 538. Cr 2.88. CXR showed new perihilar/bibasilar infiltrates versus edema. Given preeclampsia in the setting of developing pulmonary edema, decision to move to delivery. Mg gtt initiated, Lasix  and hydralazine  given (5L off 1/5, additional 2L off 1/6). Labor was induced; later 1/6 fetal HR decels were noted prompting transition to C-section.   Patient underwent C-section and bilateral salpingectomy 1/6. EBL . Intraoperative course was uncomplicated. Postoperatively, patient was noted to have ongoing high O2 needs (SpO2 high 80s) on [place] and AHF/PCCM were consulted for ICU admission/management.  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   MRI (06/11/2016)  T11-T12: degenerative disc bulge and facet hypertrophy w/o significant stenosis  L4-L5: mild b/l facet arthrosis  L5-S1: mild b/l facet arthrosis, sm central disc protrusion w/o significant stenosis or frank neural impingement and mod b/l foraminal narrowing at L5-S1 related to disc bulge and facet disease, right worse than left     Vitamin D  deficiency 10/05/2019   Significant Hospital Events: Including procedures, antibiotic start and stop dates in addition to other pertinent events   1/2 - Presented to [facility] for HA, nausea and high BP, concern for hypertensive urgency/preeclampsia. Admitted for close monitoring. 1/5 - Echo completed with EF 50-55%, low normal LV function without RWMAs and normal diastolic function; mild to moderate MR was noted. BNP/trop elevated as well as Cr. CXR c/f pulmonary edema. Decision to proceed with delivery. 1/6 - IOL completed, later in day fetal HR decels noted prompting C-section. Underwent C-section and bilateral salpingectomy. SpO2 80s on HFNC postoperatively. AHF/PCCM consulted for ICU.  Interim History / Subjective:  PCCM consulted for ICU transfer and management.  Objective:  Blood pressure (!) 168/84, pulse 71, temperature 98.1 F (36.7 C), temperature source  Oral, resp. rate 15, height 5' 7 (1.702 m), last menstrual period 12/26/2022, SpO2 93%, unknown if currently breastfeeding.    FiO2 (%):  [45 %-100 %] 55 %    Intake/Output Summary (Last 24 hours) at 07/08/2023 0931 Last data filed at 07/08/2023 0900 Gross per 24 hour  Intake 2774.48 ml  Output 6125 ml  Net -3350.52 ml   Net -7.5 L this admission  Physical Examination: General: young woman sitting up in bed in NAD HEENT: [place]/AT, eyes anicteric Neuro: awake, alert, moving all extremities, answering questions appropriately. CV: S1S2, RRR. PULM: breathing comfortably on RA, CTAB. No conversational dyspnea. Less nasal sound to her voice.  GI: soft, NT Extremities: no edema Skin:  no rashes, warm & dry  BUN 20 Creatinine 1.11 WBC 17.9 H/H10.5/32.8 Platelets 263   Resolved Hospital Problem List:    Assessment & Plan:  Preeclampsia Hypertensive emergency S/p Cesarean section and bilateral salpingectomy -Stable to transfer back to general OB floor-discussed with Dr. Eveline who agrees. - Continue magnesium  for 24 hours postdelivery - Blood pressure medication changes-added losartan , change labetalol  to Coreg .  Continue nifedipine .  Patient had tubal ligation and is not planning on future pregnancies. - Need to ensure patient has appropriate resources to help get medications after discharge.  Recommend referral to community health and wellness for assistance affording antihypertensives to ensure compliance.  Elevated troponin, suspect demand-related -No additional monitoring needed  Pulmonary edema in the setting of preeclampsia Acute respiratory failure with hypoxia -No additional diuresis needed at this time - Pulmonary hygiene - Ambulate and recommend DVT prophylaxis post-op.  AKI in the setting off uncontrolled HTN, improved -Monitor on ARB -Need to be very careful with NSAIDs  GERD -Continue PPI  Anxiety Depression Concern for food/transportation insecurity - SW involved, appreciate assistance  PCCM will be available as needed.  Best Practice: (right click and Reselect all SmartList Selections daily)   Diet/type:  Regular consistency (see orders) DVT prophylaxis: LMWH GI prophylaxis: PPI Lines: N/A Foley:  N/A Code Status:  full code Last date of multidisciplinary goals of care discussion [Per OB Team]  Labs:  CBC: Recent Labs  Lab 07/06/23 1849 07/07/23 0520 07/07/23 1456 07/07/23 2016 07/08/23 0717  WBC 15.1* 16.2* 14.1* 16.9* 17.9*  NEUTROABS  --   --   --  14.0* 14.6*  HGB 9.2* 11.2* 9.3* 10.7* 10.5*  HCT 28.8* 34.3* 28.9* 33.4* 32.8*  MCV 86.0 84.3 84.8 85.4 86.3  PLT 221 257 220 244 263   Basic Metabolic Panel: Recent Labs  Lab 07/03/23 1251 07/06/23 1849 07/07/23 0520 07/07/23 2016 07/08/23 0717  NA 135 134* 134* 134* 132*  K 3.4* 3.7 4.1 4.3 4.4  CL 101 106 103 101 97*  CO2 23 20* 21* 23 25  GLUCOSE 104* 92 93 106* 88  BUN 11 18 16 19 20   CREATININE 1.02* 0.94 0.97 1.19* 1.11*  CALCIUM  8.5* 8.4* 8.3* 7.9* 7.7*  MG  --   --  3.6*  --   --    GFR: CrCl cannot be calculated (Unknown ideal weight.). Recent Labs  Lab 07/07/23 0520 07/07/23 1456 07/07/23 2016 07/08/23 0717  WBC 16.2* 14.1* 16.9* 17.9*   Liver Function Tests: Recent Labs  Lab 07/03/23 1251 07/06/23 1849 07/07/23 0520 07/07/23 2016 07/08/23 0717  AST 14* 21 31 42* 37  ALT 8 16 26  38 35  ALKPHOS 111 103 110 105 111  BILITOT 0.4 0.4 0.5 0.5 0.4  PROT 6.3* 5.7* 6.6 6.6 6.4*  ALBUMIN  1.9* 1.9* 2.2* 2.2* 2.1*      Critical care time:   Leita SHAUNNA Gaskins, DO 07/08/23 9:52 AM [facility]  For contact information, see Amion. If no response to pager, please call PCCM consult pager. After hours, 7PM- 7AM, please call Elink.

## 2023-07-08 NOTE — Lactation Note (Signed)
 This note was copied from a baby's chart. Lactation Consultation Note  Patient Name: Shirley [name] Unijb'd Date: 07/08/2023 Age:35 hours   LC messaged RN taking care of MOB.  LC inquired about whether Mom would like to start pumping.  RN stated that Mom will be transferred to [facility] on magnesium  sulfate. RN to notify [facility] when she arrives and can initiate pumping.  Claudene Aleck BRAVO 07/08/2023, 11:11 AM

## 2023-07-08 NOTE — Lactation Note (Signed)
 This note was copied from a baby's chart. Lactation Consultation Note  Patient Name: Shirley [name] Date: 07/08/2023 Age:35 hours   LC attempted to consult with Mom on OBSC.  RN stated that Mom was sleepy and wouldn't be able to grasp education provided.  LC peeked in to see if Mom was awake, she was sleeping.    RN states Mom didn't say anything about pumping for her baby, but she wasn't asked.  RN to awaken Mom for meds at 4pm, [facility] will try again then.   Shirley [name] 07/08/2023, 1:48 PM

## 2023-07-08 NOTE — Progress Notes (Signed)
 Advanced Heart Failure Rounding Note  Cardiologist: None  Chief Complaint:  Subjective:    Resting comfortably in bed. Stable now on RA.   Objective:   Weight Range:   Body mass index is 47.82 kg/m.   Vital Signs:   Temp:  [96.9 F (36.1 C)-98.1 F (36.7 C)] 98.1 F (36.7 C) (01/07 0754) Pulse Rate:  [54-96] 96 (01/07 1030) Resp:  [11-32] 30 (01/07 1030) BP: (90-177)/(60-124) 122/97 (01/07 1030) SpO2:  [84 %-100 %] 95 % (01/07 1030) FiO2 (%):  [45 %-100 %] 55 % (01/07 0153) Last BM Date : 07/04/23  Weight change: There were no vitals filed for this visit.  Intake/Output:   Intake/Output Summary (Last 24 hours) at 07/08/2023 1049 Last data filed at 07/08/2023 1000 Gross per 24 hour  Intake 2790.01 ml  Output 7900 ml  Net -5109.99 ml      Physical Exam  General:  well appearing.  No respiratory difficulty HEENT: normal Neck: supple. JVD ~8 cm. Carotids 2+ bilat; no bruits. No lymphadenopathy or thyromegaly appreciated. Cor: PMI nondisplaced. Regular rate & rhythm. No rubs, gallops or murmurs. Lungs: clear Abdomen: soft, nontender, nondistended. No hepatosplenomegaly. No bruits or masses. Good bowel sounds. Extremities: no cyanosis, clubbing, rash, trace BLE edema  Neuro: alert & oriented x 3, cranial nerves grossly intact. moves all 4 extremities w/o difficulty. Affect pleasant.   Telemetry   NSR 70s (Personally reviewed)    EKG    No new EKG to review  Labs    CBC Recent Labs    07/07/23 2016 07/08/23 0717  WBC 16.9* 17.9*  NEUTROABS 14.0* 14.6*  HGB 10.7* 10.5*  HCT 33.4* 32.8*  MCV 85.4 86.3  PLT 244 263   Basic Metabolic Panel Recent Labs    98/93/74 0520 07/07/23 2016 07/08/23 0717  NA 134* 134* 132*  K 4.1 4.3 4.4  CL 103 101 97*  CO2 21* 23 25  GLUCOSE 93 106* 88  BUN 16 19 20   CREATININE 0.97 1.19* 1.11*  CALCIUM  8.3* 7.9* 7.7*  MG 3.6*  --   --    Liver Function Tests Recent Labs    07/07/23 2016 07/08/23 0717  AST  42* 37  ALT 38 35  ALKPHOS 105 111  BILITOT 0.5 0.4  PROT 6.6 6.4*  ALBUMIN 2.2* 2.1*   No results for input(s): LIPASE, AMYLASE in the last 72 hours. Cardiac Enzymes No results for input(s): CKTOTAL, CKMB, CKMBINDEX, TROPONINI in the last 72 hours.  BNP: BNP (last 3 results) Recent Labs    07/06/23 1849  BNP 538.6*    ProBNP (last 3 results) No results for input(s): PROBNP in the last 8760 hours.   D-Dimer No results for input(s): DDIMER in the last 72 hours. Hemoglobin A1C No results for input(s): HGBA1C in the last 72 hours. Fasting Lipid Panel No results for input(s): CHOL, HDL, LDLCALC, TRIG, CHOLHDL, LDLDIRECT in the last 72 hours. Thyroid  Function Tests No results for input(s): TSH, T4TOTAL, T3FREE, THYROIDAB in the last 72 hours.  Invalid input(s): FREET3  Other results:   Imaging    No results found.   Medications:     Scheduled Medications:  acetaminophen   1,000 mg Oral Q6H   carvedilol   12.5 mg Oral BID WC   Chlorhexidine  Gluconate Cloth  6 each Topical Daily   enoxaparin  (LOVENOX ) injection  40 mg Subcutaneous Q24H   ferrous sulfate   325 mg Oral QODAY   ketorolac   30 mg Intravenous Q6H  Followed by   ibuprofen   600 mg Oral Q6H   NIFEdipine   30 mg Oral BID   pantoprazole   40 mg Oral BID   prenatal multivitamin  1 tablet Oral Q1200   senna-docusate  2 tablet Oral Daily   simethicone   80 mg Oral TID PC    Infusions:  lactated ringers  10 mL/hr at 07/08/23 1000   magnesium  sulfate 1 g/hr (07/08/23 1000)    PRN Medications: acetaminophen , coconut oil, cyclobenzaprine , witch hazel-glycerin  **AND** dibucaine, diphenhydrAMINE , hydrOXYzine , ipratropium-albuterol , menthol -cetylpyridinium, mouth rinse, oxyCODONE , simethicone , zolpidem     Patient Profile   Shirley [name] is a 33 y.o. female with hypertension, hx preeclampsia in 2019 and obesity. Admitted 07/03/23 with hypertensive emergency. Now s/p  emergency c-section and tubal ligation 07/07/23. AHF team to see   Assessment/Plan  Hypertensive emergency>> preeclampsia - s/p emergency c-section and tubal ligation 07/07/23. OB following - SBP elevated. Labetolol held overnight with SB. Stop labetolol. Will start coreg  12.5 mg BID - Can increase nifedipine  as needed - Will hold off on losartan /olmesartan  for now with Toradol  and ibuprofen . Plan to add once off of these medications.  - Echo showed EF 50-55%, normal RV, mild MR (read as Mod MR per Dr. Gardenia) - Continue Mg for 24 hrs per OB - Continue lasix  80 mg BID through today   MR - Moderate MR on echo - May improve with diuresis, diuresis as above - Repeat limited echo prior to discharge to reassess MR   Acute respiratory distress - Per CCM - Suspect flash pulmonary edema with mod MR - Continue diuresis through today - Stable now on RA   Elevated troponin's - HsTrop 152>140 - Denies CP - Suspect demand ischemia with volume overload.   Length of Stay: 5  Beckey LITTIE Coe, NP  07/08/2023, 10:49 AM  Advanced Heart Failure Team Pager [phone] (M-F; 7a - 5p)  Please contact [facility] for night-coverage after hours (5p -7a ) and weekends on [email or web address]

## 2023-07-08 NOTE — TOC Initial Note (Addendum)
 Transition of Care [facility]) - Initial/Assessment Note    Patient Details  Name: Shirley [name] MRN: [id number] Date of Birth: 09-26-1990  Transition of [facility]) CM/SW Contact:    Justina Delcia Czar, RN Phone Number: [phone] 07/08/2023, 1:07 PM  Clinical Narrative:                 HF TOC CM spoke to pt and states she is will have assistance from her mother at dc. Discussed medications and states she will be able to afford copay for meds.  OB CSW has provided pt with information on transportation.  Pt gave permission to arrange follow up with her PCP. Office closed for lunch will retry at 130 pm. Wednesday 07/30/2023 at 10:10 am, hospital follow up.   Discuss heart healthy diet, message sent to attending to see if Dietician can be consulted.   Expected Discharge Plan: Home/Self Care Barriers to Discharge: Continued Medical Work up   Patient Goals and CMS Choice Patient states their goals for this hospitalization and ongoing recovery are:: wants to focus on getting well to take care of baby and children          Expected Discharge Plan and Services   Discharge Planning Services: CM Consult   Living arrangements for the past 2 months: Single Family Home                                      Prior Living Arrangements/Services Living arrangements for the past 2 months: Single Family Home Lives with:: Minor Children, Parents Patient language and need for interpreter reviewed:: Yes Do you feel safe going back to the place where you live?: Yes      Need for Family Participation in Patient Care: Yes (Comment) Care giver support system in place?: Yes (comment)   Criminal Activity/Legal Involvement Pertinent to Current Situation/Hospitalization: No - Comment as needed  Activities of Daily Living   ADL Screening (condition at time of admission) Independently performs ADLs?: Yes (appropriate for developmental age) Is the patient deaf or have difficulty hearing?:  No Does the patient have difficulty seeing, even when wearing glasses/contacts?: No Does the patient have difficulty concentrating, remembering, or making decisions?: No  Permission Sought/Granted Permission sought to share information with : Case Manager, Family Supports, PCP Permission granted to share information with : Yes, Verbal Permission Granted  Share Information with NAME: Rosaline Minor     Permission granted to share info w Relationship: mother  Permission granted to share info w Contact Information: [phone]  Emotional Assessment   Attitude/Demeanor/Rapport: Engaged Affect (typically observed): Accepting Orientation: : Oriented to Self, Oriented to Place, Oriented to  Time, Oriented to Situation   Psych Involvement: No (comment)  Admission diagnosis:  Hypertensive emergency [I16.1] Patient Active Problem List   Diagnosis Date Noted   Acute pulmonary edema (HCC) 07/07/2023   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, antepartum (HCC) 07/06/2023   Hypertensive emergency 07/03/2023   Hx of preeclampsia, prior pregnancy, currently pregnant, second trimester 04/07/2023   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 [place] [address] Phone: [phone] Fax: [phone]     Social Drivers of Health (SDOH) Social History: SDOH Screenings   Food Insecurity: No Food Insecurity  (07/06/2023)  Recent Concern: Food Insecurity - Food Insecurity Present (07/03/2023)  Housing: Low Risk  (07/06/2023)  Transportation Needs: No Transportation Needs (07/06/2023)  Recent Concern: Transportation Needs - Unmet Transportation Needs (07/03/2023)  Utilities: Not At Risk (07/06/2023)  Depression (PHQ2-9): Medium Risk (03/11/2023)  Tobacco Use: Medium Risk (07/06/2023)   SDOH Interventions: Food Insecurity Interventions: Inpatient TOC Transportation Interventions: Inpatient TOC   Readmission Risk Interventions     No data to display

## 2023-07-08 NOTE — Lactation Note (Addendum)
 This note was copied from a baby's chart.  NICU Lactation Consultation Note  Patient Name: Shirley [name] Unijb'd Date: 07/08/2023 Age:35 hours  Reason for consult: Initial assessment; NICU baby; Preterm <34wks; Infant < 6lbs; 1st time breastfeeding; Other (Comment) (CHTN)  SUBJECTIVE  LC in to visit with P4 Mom of preterm infant TJ in the NICU.  Mom was recently transferred from [facility] where she needed respiratory support.  Mom was put on magnesium  sulfate and was recently DC'd from that.  Mom continues on 1L/min [place].  Mom states she would like to breastfeed with this baby, as she didn't with her other 3 babies.  She knows the benefit of MOM to very preterm infants in the NICU.  LC supported her decision and praised her for this.    Reviewed breast massage and hand expression, colostrum noted to spray with demonstration. LC provided a pumping top and assisted with first pumping using 24 mm flanges.  Mom aware of colostrum protectors and how they can't be used after 24 hr use.  Mom encouraged to collect every drop and transport colostrum to NICU for oral care.  Mom does not have a DEBP at home.  STORK pump requested. LC set up 2 bins and labeled them.  Mom very fatigued and will need reinforcement of education tomorrow and the next day.  OBJECTIVE Infant data: No data recorded O2 Device: CPAP FiO2 (%): 28 %  Infant feeding assessment  Maternal data: [id number] C-Section, Low Transverse Has patient been taught Hand Expression?: Yes Hand Expression Comments: milk easily expressed Significant Breast History:: ++ breast changes Current breast feeding challenges:: infant separation, late initiation of pumping Does the patient have breastfeeding experience prior to this delivery?: No Pumping frequency: Initiated double pumping at 35 hrs post partum Flange Size: 24 Hands-free pumping top sizes: X-Large [name]) Risk factor for low/delayed milk supply:: Infant separation/Baby in NICU/Mom  in cardiac ICU for 35 hrs  WIC Program: No (Did not sign [facility] for [facility] yet) WIC Referral Sent?: No Pump:  (submitted a STORK pump request)  ASSESSMENT Infant:  Feeding Status: NPO  Maternal: No data recorded INTERVENTIONS/PLAN Interventions: Interventions: Breast feeding basics reviewed; Breast massage; Hand express; DEBP; Education Tools: Pump; Flanges; Hands-free pumping top Pump Education: Setup, frequency, and cleaning; Milk Storage  Plan: 1- Massage breasts and hand express 2- Pump both breasts on initiation setting 3- ask for help prn  Consult Status: NICU follow-up NICU Follow-up type: Verify onset of copious milk; Verify absence of engorgement; New admission follow up   Claudene Aleck BRAVO 07/08/2023, 5:06 PM

## 2023-07-08 NOTE — Progress Notes (Signed)
 Nutrition Education consult for Heart Healthy diet received. Diet order changed to Heart Healthy. Diet instructions placed in discharge instructions.  Will plan to see patient afternoon of 1/8 for education

## 2023-07-08 NOTE — Discharge Instructions (Signed)
Heart-Healthy Nutrition Therapy  A heart-healthy diet is recommended to reduce your unhealthy blood cholesterol levels, manage high blood pressure, and lower your risk for heart disease. To follow a heart-healthy diet, Eat a balanced diet with whole grains, fruits and vegetables, and lean protein sources. Achieve and maintain a healthy weight. Choose heart-healthy unsaturated fats. Limit saturated fats, trans fats, and cholesterol intake. Eat more plant-based or vegetarian meals using beans and soy foods for protein. Eat whole, unprocessed foods to limit the amount of sodium (salt) you eat. Limit refined carbohydrates especially sugar, sweets and sugar-sweetened beverages. If you drink alcohol, do so in moderation: one serving per day (women) and two servings per day (men). One serving is equivalent to 12 ounces beer, 5 ounces wine, or 1.5 ounces distilled spirits  Tips Tips for Choosing Heart-Healthy Fats Choose lean protein and low-fat dairy foods to reduce saturated fat intake. Saturated fat is usually found in animal-based protein and is associated with certain health risks. Saturated fat is the biggest contributor to raised low-density lipoprotein (LDL) cholesterol levels in the diet. Research shows that limiting saturated fat lowers unhealthy cholesterol levels. Eat no more than 5-6% of your total calories each day from saturated fat. Ask your registered dietitian nutritionist (RDN) to help you determine how much saturated fat is right for you. There are many foods that do not contain large amounts of saturated fats. Swapping these foods to replace foods high in saturated fats will help you limit the saturated fat you eat and improve your cholesterol levels. You can also try eating more plant-based or vegetarian meals. Instead of. Try:  Whole milk, cheese, yogurt, and ice cream 1%, %, or skim milk, low-fat cheese, non-fat yogurt, and low-fat ice cream  Fatty, marbled beef and pork Lean  beef, pork, or venison  Poultry with skin Poultry without skin  Butter, stick margarine Reduced-fat, whipped, or liquid spreads  Coconut oil, palm oil Liquid vegetable oils: corn, canola, olive, soybean and safflower oils   Avoid trans fats. Trans fats increase levels of LDL-cholesterol. Hydrogenated fat in processed foods is the main source of trans fats in foods.  Trans fats can be found in stick margarine, shortening, processed sweets, baked goods, some fried foods, and packaged foods made with hydrogenated oils. Avoid foods with "partially hydrogenated oil" on the ingredient list such as: cookies, pastries, baked goods, biscuits, crackers, microwave popcorn, and frozen dinners. Choose foods with heart healthy fats. Polyunsaturated and monounsaturated fat are unsaturated fats that may help lower your blood cholesterol level when used in place of saturated fat in your diet. Ask your RDN about taking a dietary supplement with plant sterols and stanols to help lower your cholesterol level. Research shows that substituting saturated fats with unsaturated fats is beneficial to cholesterol levels. Try these easy swaps:   Instead of. Try:  Butter, stick margarine, or solid shortening Reduced-fat, whipped, or liquid spreads  Beef, pork, or poultry with skin   Fish and seafood  Chips, crackers, snack foods Raw or unsalted nuts and seeds or nut butters Hummus with vegetables Avocado on toast  Coconut oil, palm oil Liquid vegetable oils: corn, canola, olive, soybean and safflower oils    Limit the amount of cholesterol you eat to less than 200 milligrams per day. Cholesterol is a substance carried through the bloodstream via lipoproteins, which are known as "transporters" of fat. Some body functions need cholesterol to work properly, but too much cholesterol in the bloodstream can damage arteries and build up blood   vessel linings (which can lead to heart attack and stroke). You should eat less than  200 milligrams cholesterol per day. People respond differently to eating cholesterol. There is no test available right now that can figure out which people will respond more to dietary cholesterol and which will respond less. For individuals with high intake of dietary cholesterol, different types of increase (none, small, moderate, large) in LDL-cholesterol levels are all possible.   Food sources of cholesterol include egg yolks and organ meats such as liver, gizzards.  Limit egg yolks to two to four per week and avoid organ meats like liver and gizzards to control cholesterol intake.  Tips for Choosing Heart-Healthy Carbohydrates Consume foods rich in viscous (soluble) fiber Viscous, or soluble, fiber is found in the walls of plant cells. Viscous fiber is found only in plant-based foods--animal-based foods like meat or dairy products do not contain fiber. In the stomach, viscous fibers absorb water and swell to form a thick, jelly-like mass. This helps to lower your unhealthy cholesterol Rich sources of viscous fiber include asparagus, Brussels sprouts, sweet potatoes, turnips, apricots, mangoes, oranges, legumes, barley, oats, and oat bran. Eat at least 5 to 10 grams of viscous fiber each day. As you increase your fiber intake gradually, also increase the amount of water you drink. This will help prevent constipation. If you have difficulty achieving this goal, ask your RDN about fiber laxatives. Choose fiber supplements made with viscous fibers such as psyllium seed husks or methylcellulose to help lower unhealthy cholesterol. Limit refined carbohydrates There are three types of carbohydrates: starches, sugar, and fiber. Some carbohydrates occur naturally in food, like the starches in rice or corn or the sugars in fruits and milk. Refined carbohydrates--foods with high amounts of simple sugars--can raise triglyceride levels. High triglyceride levels are associated with coronary heart disease. Some  examples of refined carbohydrate foods are table sugar, sweets, and beverages sweetened with added sugar. Tips for Reducing Sodium (Salt) Although sodium is important for your body to function, too much sodium can be harmful for people with high blood pressure.  As sodium and fluid buildup in your tissues and bloodstream, your blood pressure increases. High blood pressure may cause damage to other organs and increase your risk for a stroke. Keep your salt intake to 2300 milligrams or less per day. Even if you take a pill for blood pressure or a water pill (diuretic) to remove fluid, it is still important to have less salt in your diet. Ask your RDN what amount of sodium is right for you. Avoid processed foods.  Eat more fresh foods. Fresh fruits and vegetables are naturally low in sodium, as well as frozen vegetables and fruits that have no added juices or sauces. Fresh meats are lower in sodium than processed meats, such as bacon, sausage, and hotdogs.  Read the nutrition label or ask your butcher to help you find a fresh meat that is low in sodium. Eat less salt--at the table and when cooking. A single teaspoon of table salt has 2,300 mg of sodium. Leave the salt out of recipes for pasta, casseroles, and soups. Ask your RDN how to cook your favorite recipes without sodium Be a smart shopper. Look for food packages that say "salt-free" or "sodium-free." These items contain less than 5 milligrams of sodium per serving. "Very low-sodium" products contain less than 35 milligrams of sodium per serving. "Low-sodium" products contain less than 140 milligrams of sodium per serving.  Beware of "reduced salt" or "reduced   sodium" products.  These items may still be high in sodium. Check the nutrition label.  Add flavors to your food without adding sodium. Try lemon juice, lime juice, fruit juice or vinegar.   Dry or fresh herbs add flavor. Try basil, bay leaf, dill, rosemary, parsley, sage, dry mustard,  nutmeg, thyme, and paprika. Pepper, red pepper flakes, and cayenne pepper can add spice to your meals without adding sodium. Hot sauce contains sodium, but if you use just a drop or two, it will not add up to much. Buy a sodium-free seasoning blend or make your own at home.    Additional Lifestyle Tips Achieve and maintain a healthy weight. Talk with your RDN or your doctor about what is a healthy weight for you. Set goals to reach and maintain that weight.  To lose weight, reduce your calorie intake along with increasing your physical activity. A weight loss of 10 to 15 pounds could reduce LDL-cholesterol by 5 milligrams per deciliter. Participate in physical activity. Talk with your health care team to find out what types of physical activity are best for you. Set a plan to get about 30 minutes of exercise on most days.   Foods to Choose or to Limit Food Group Foods to Choose Foods to Limit  Grains Whole grain breads and cereals, including whole wheat, barley, rye, buckwheat, corn, teff, quinoa, millet, amaranth, brown or wild rice, sorghum, and oats Pasta, especially whole wheat or other whole grain types Brown rice, quinoa or wild rice Whole grain crackers, bread, rolls, pitas Home-made bread with reduced-sodium baking soda Breads or crackers topped with salt Cereals (hot or cold) with more than 300 mg sodium per serving Biscuits, cornbread, and other "quick" breads prepared with baking soda Bread crumbs or stuffing mix from a store High-fat bakery products, such as doughnuts, biscuits, croissants, danish pastries, pies, cookies Instant cooking foods to which you add hot water and stir--potatoes, noodles, rice, etc. Packaged starchy foods--seasoned noodle or rice dishes, stuffing mix, macaroni and cheese dinner Snacks made with partially hydrogenated oils, including chips, cheese puffs, snack mixes, regular crackers, butter-flavored popcorn  Protein Foods Lean cuts of beef and pork  (loin, leg, round, extra lean hamburger) Skinless poultry Fish Venison and other wild game Dried beans and peas Nuts and nut butters (unsalted) Seeds and seed butters (unsalted) Meat alternatives made with soy or textured vegetable protein Egg whites or egg substitute Cold cuts made with lean meat or soy protein Higher-fat cuts of meats (ribs, t-bone steak, regular hamburger) Bacon, sausage, or hot dogs Cold cuts, such as salami or bologna, deli meats, cured meats, corned beef Organ meats (liver, brains, gizzards, sweetbreads) Poultry with skin Fried or smoked meat, poultry, and fish Whole eggs and egg yolks (more than 2-4 per week) Salted legumes, nuts, seeds, or nut/seed butters Meat alternatives with high levels of sodium (>300 mg per serving) or saturated fat (>5 g per serving)  Dairy and Dairy Alternatives Nonfat (skim), low-fat, or 1%-fat milk Nonfat or low-fat yogurt or cottage cheese Fat-free and low-fat cheese Fortified non-dairy milk: almond, cashew, pea, and soy Whole milk, 2% fat milk, buttermilk Whole milk yogurt or ice cream Cream, half-&-half Cream cheese Sour cream Cheese  Vegetables Fresh, frozen, or canned vegetables without added fat or salt Avocados Canned or frozen vegetables with salt, fresh vegetables prepared with salt, butter, cheese, or cream sauce Fried vegetables Pickled vegetables such as olives, pickles, or sauerkraut  Fruits Fresh, frozen, canned, or dried fruit Fried fruits Fruits   served with butter or cream  Oils Unsaturated oils (corn, olive, peanut, soy, sunflower, canola) Soft or liquid margarines and vegetable oil spreads Salad dressings made from saturated fats Butter, stick margarine, shortening Partially hydrogenated oils or trans fats Tropical oils (coconut, palm, palm kernel oils)  Other Prepared or homemade foods, including soups, casseroles, and salads made from recommended ingredients and contain <600 mg sodium. Low-sodium seasonings  (ketchup, barbeque sauce) Spices, herbs, Salt-free seasoning mixes and marinades Vinegar Lemon or lime juice Prepared foods, including soups, casseroles, and salads made from recommended ingredients and contain >600 mg sodium. Frozen meals and prepared sides that are >600 mg of sodium Sugary and/or fatty desserts, candy, and other sweets Salts:  sea salt, kosher salt, onion salt, and garlic salt, seasoning mixes containing salt Flavorings: bouillon cubes, catsup or ketchup, barbeque sauce, Worcestershire sauce, soy sauce, salsa, relish, teriyaki sauce   Heart-Healthy Sample 1-Day Menu View Nutrient Info Breakfast 1 cup oatmeal 1 cup fat-free milk 1 cup blueberries 1 ounce almonds, unsalted 1 cup brewed coffee  Lunch 2 slices whole-wheat bread 2 ounces lean turkey breast 1 ounce low-fat Swiss cheese 2 slices tomato 2 lettuce leaves 1 pear 1 cup skim milk  Afternoon Snack 1 ounce trail mix with unsalted nuts, seeds, and raisins  Evening Meal 3 ounces broiled salmon 2/3 cup brown rice 1 teaspoon margarine, soft tub  cup broccoli, cooked  cup carrots, cooked 1 cup tossed salad 1 teaspoon olive oil and vinegar dressing 1 small whole-wheat roll 1 teaspoon margarine, soft tub 1 cup tea  Evening Snack 1 small banana  Daily Sum Nutrient Unit Value  Macronutrients  Energy kcal 2069  Energy kJ 8655  Protein g 146  Total lipid (fat) g 69  Carbohydrate, by difference g 228  Fiber, total dietary g 33  Sugars, total g 85  Minerals  Calcium, Ca mg 1292  Iron, Fe mg 14  Sodium, Na mg 1751  Vitamins  Vitamin C, total ascorbic acid mg 85  Vitamin A, IU IU 17756  Vitamin D IU 231  Lipids  Fatty acids, total saturated g 12  Fatty acids, total monounsaturated g 27  Fatty acids, total polyunsaturated g 24  Cholesterol mg 270     Heart-Healthy Vegan 1-Day Sample Menu View Nutrient Info Breakfast 1 slice whole wheat toast 2 tablespoons peanut butter without salt Tofu scramble  made with:  cup calcium-set tofu  cup green pepper  cup spinach  cup tomatoes  cup white mushrooms 1 teaspoon canola oil 1 cup soymilk fortified with calcium, vitamin B12, and vitamin D  Lunch 1 cup reduced sodium split pea soup 1 whole wheat dinner roll 1 medium apple  Dinner Salad made with: 1 cup lentils  cup cooked broccoli  cup cooked carrots 2 tablespoons hummus 1 cup sliced strawberries 1 cup soymilk fortified with calcium, vitamin B12, and vitamin D  Evening Snack 1 cup soy yogurt  cup mixed nuts  Daily Sum Nutrient Unit Value  Macronutrients  Energy kcal 1672  Energy kJ 7000  Protein g 86  Total lipid (fat) g 65  Carbohydrate, by difference g 205  Fiber, total dietary g 47  Sugars, total g 73  Minerals  Calcium, Ca mg 1443  Iron, Fe mg 19  Sodium, Na mg 1148  Vitamins  Vitamin C, total ascorbic acid mg 253  Vitamin A, IU IU 15451  Vitamin D IU 361  Lipids  Fatty acids, total saturated g 11  Fatty acids, total monounsaturated g   29  Fatty acids, total polyunsaturated g 19  Cholesterol mg 5     Heart-Healthy Vegetarian (Lacto-Ovo) Sample 1-Day Menu View Nutrient Info Breakfast 1 cup cooked oatmeal 1 tablespoon ground flaxseed 1 cup blueberries 2 scrambled egg whites with 1 teaspoon canola oil 2 tablespoons salsa 1 cup fat-free milk 1 cup coffee Oil, canola  Lunch 2 slices whole wheat bread 2 tablespoons peanut butter without salt 1 small banana 6 ounces fat-free plain yogurt 1 cup sliced red pepper 2 tablespoons hummus 1 cup fat-free milk  Evening Meal Stir fry made with:  cup tofu 1 cup brown rice  cup cooked broccoli  cup cooked carrots  cup cooked green beans 1 teaspoon peanut oil  Evening Snack 1 slice low-fat mozzarella cheese 1 medium apple  Daily Sum Nutrient Unit Value  Macronutrients  Energy kcal 1764  Energy kJ 7375  Protein g 87  Total lipid (fat) g 53  Carbohydrate, by difference g 254  Fiber, total dietary g 35   Sugars, total g 98  Minerals  Calcium, Ca mg 1609  Iron, Fe mg 12  Sodium, Na mg 1434  Vitamins  Vitamin C, total ascorbic acid mg 210  Vitamin A, IU IU 16317  Vitamin D IU 234  Lipids  Fatty acids, total saturated g 12  Fatty acids, total monounsaturated g 21  Fatty acids, total polyunsaturated g 15  Cholesterol mg 31    Copyright 2020  [employer]. All rights reserved  

## 2023-07-08 NOTE — Progress Notes (Signed)
 0800 patient alert x4 able to make all needs known SP02 at 100% on 16L RT will decrease patient tearful would like to go back to mother baby ASAP for she has not seen her son education given on heart and respiratory status as well as diet because patient want regular diet vs heart healthy.

## 2023-07-09 DIAGNOSIS — O1490 Unspecified pre-eclampsia, unspecified trimester: Secondary | ICD-10-CM

## 2023-07-09 DIAGNOSIS — R7989 Other specified abnormal findings of blood chemistry: Secondary | ICD-10-CM

## 2023-07-09 DIAGNOSIS — I340 Nonrheumatic mitral (valve) insufficiency: Secondary | ICD-10-CM

## 2023-07-09 HISTORY — DX: Unspecified pre-eclampsia, unspecified trimester: O14.90

## 2023-07-09 LAB — COMPREHENSIVE METABOLIC PANEL
ALT: 26 U/L (ref 0–44)
ALT: 25 U/L (ref 0–44)
AST: 24 U/L (ref 15–41)
AST: 24 U/L (ref 15–41)
Albumin: 2 g/dL — ABNORMAL LOW (ref 3.5–5.0)
Albumin: 2.1 g/dL — ABNORMAL LOW (ref 3.5–5.0)
Alkaline Phosphatase: 102 U/L (ref 38–126)
Alkaline Phosphatase: 110 U/L (ref 38–126)
Anion gap: 10 (ref 5–15)
Anion gap: 7 (ref 5–15)
BUN: 26 mg/dL — ABNORMAL HIGH (ref 6–20)
BUN: 20 mg/dL (ref 6–20)
CO2: 23 mmol/L (ref 22–32)
CO2: 25 mmol/L (ref 22–32)
Calcium: 7.3 mg/dL — ABNORMAL LOW (ref 8.9–10.3)
Calcium: 7.5 mg/dL — ABNORMAL LOW (ref 8.9–10.3)
Chloride: 103 mmol/L (ref 98–111)
Chloride: 103 mmol/L (ref 98–111)
Creatinine, Ser: 1.19 mg/dL — ABNORMAL HIGH (ref 0.44–1.00)
Creatinine, Ser: 1.02 mg/dL — ABNORMAL HIGH (ref 0.44–1.00)
GFR, Estimated: >60 mL/min (ref >60)
GFR, Estimated: >60 mL/min (ref >60)
Glucose, Bld: 96 mg/dL (ref 70–99)
Glucose, Bld: 95 mg/dL (ref 70–99)
Potassium: 4.3 mmol/L (ref 3.5–5.1)
Potassium: 4.5 mmol/L (ref 3.5–5.1)
Sodium: 136 mmol/L (ref 135–145)
Sodium: 135 mmol/L (ref 135–145)
Total Bilirubin: 0.3 mg/dL (ref 0.0–1.2)
Total Bilirubin: 0.3 mg/dL (ref 0.0–1.2)
Total Protein: 6 g/dL — ABNORMAL LOW (ref 6.5–8.1)
Total Protein: 6.6 g/dL (ref 6.5–8.1)

## 2023-07-09 LAB — CBC WITH DIFFERENTIAL/PLATELET
Abs Immature Granulocytes: 0.17 10*3/uL — ABNORMAL HIGH (ref 0.00–0.07)
Abs Immature Granulocytes: 0.17 10*3/uL — ABNORMAL HIGH (ref 0.00–0.07)
Basophils Absolute: 0.1 10*3/uL (ref 0.0–0.1)
Basophils Absolute: 0.1 10*3/uL (ref 0.0–0.1)
Basophils Relative: 0 %
Basophils Relative: 0 %
Eosinophils Absolute: 0.1 10*3/uL (ref 0.0–0.5)
Eosinophils Absolute: 0.2 10*3/uL (ref 0.0–0.5)
Eosinophils Relative: 1 %
Eosinophils Relative: 1 %
HCT: 33.2 % — ABNORMAL LOW (ref 36.0–46.0)
HCT: 34.1 % — ABNORMAL LOW (ref 36.0–46.0)
Hemoglobin: 10.5 g/dL — ABNORMAL LOW (ref 12.0–15.0)
Hemoglobin: 10.8 g/dL — ABNORMAL LOW (ref 12.0–15.0)
Immature Granulocytes: 1 %
Immature Granulocytes: 1 %
Lymphocytes Relative: 16 %
Lymphocytes Relative: 17 %
Lymphs Abs: 2.4 10*3/uL (ref 0.7–4.0)
Lymphs Abs: 2.7 10*3/uL (ref 0.7–4.0)
MCH: 27.1 pg (ref 26.0–34.0)
MCH: 27.1 pg (ref 26.0–34.0)
MCHC: 31.6 g/dL (ref 30.0–36.0)
MCHC: 31.7 g/dL (ref 30.0–36.0)
MCV: 85.8 fL (ref 80.0–100.0)
MCV: 85.7 fL (ref 80.0–100.0)
Monocytes Absolute: 0.9 10*3/uL (ref 0.1–1.0)
Monocytes Absolute: 0.9 10*3/uL (ref 0.1–1.0)
Monocytes Relative: 6 %
Monocytes Relative: 6 %
Neutro Abs: 11.2 10*3/uL — ABNORMAL HIGH (ref 1.7–7.7)
Neutro Abs: 12 10*3/uL — ABNORMAL HIGH (ref 1.7–7.7)
Neutrophils Relative %: 76 %
Neutrophils Relative %: 75 %
Platelets: 271 10*3/uL (ref 150–400)
Platelets: 278 10*3/uL (ref 150–400)
RBC: 3.87 MIL/uL (ref 3.87–5.11)
RBC: 3.98 MIL/uL (ref 3.87–5.11)
RDW: 16.2 % — ABNORMAL HIGH (ref 11.5–15.5)
RDW: 16 % — ABNORMAL HIGH (ref 11.5–15.5)
WBC: 14.8 10*3/uL — ABNORMAL HIGH (ref 4.0–10.5)
WBC: 16 10*3/uL — ABNORMAL HIGH (ref 4.0–10.5)
nRBC: 0.3 % — ABNORMAL HIGH (ref 0.0–0.2)
nRBC: 0.2 % (ref 0.0–0.2)

## 2023-07-09 LAB — SURGICAL PATHOLOGY

## 2023-07-09 MED ORDER — FUROSEMIDE 20 MG PO TABS
20.0000 mg | ORAL_TABLET | Freq: Every day | ORAL | Status: DC
  Administered 2023-07-09 – 2023-07-10 (×2): 20 mg via ORAL
  Filled 2023-07-09 (×2): qty 1

## 2023-07-09 MED ORDER — IBUPROFEN 600 MG PO TABS
600.0000 mg | ORAL_TABLET | Freq: Four times a day (QID) | ORAL | Status: DC | PRN
  Administered 2023-07-10 (×2): 600 mg via ORAL
  Filled 2023-07-09 (×2): qty 1

## 2023-07-09 MED ORDER — CARVEDILOL 25 MG PO TABS
25.0000 mg | ORAL_TABLET | Freq: Two times a day (BID) | ORAL | Status: DC
  Administered 2023-07-09 – 2023-07-10 (×2): 25 mg via ORAL
  Filled 2023-07-09 (×2): qty 1

## 2023-07-09 MED ORDER — ACETAMINOPHEN 500 MG PO TABS
1000.0000 mg | ORAL_TABLET | Freq: Four times a day (QID) | ORAL | Status: DC | PRN
  Administered 2023-07-10 (×2): 1000 mg via ORAL
  Filled 2023-07-09 (×2): qty 2

## 2023-07-09 MED ORDER — ACETAMINOPHEN 500 MG PO TABS: 1000.0000 mg | ORAL_TABLET | Freq: Four times a day (QID) | ORAL | Status: DC | PRN

## 2023-07-09 NOTE — Progress Notes (Signed)
 Cardiologist: None  Chief Complaint:  Subjective:    Denies anginal chest pain. No heart failure symptoms. Resting in bed comfortably. On room air  Objective:   Weight Range:   Body mass index is 47.82 kg/m.   Vital Signs:   Temp:  [97.9 F (36.6 C)-98.1 F (36.7 C)] 98.1 F (36.7 C) (01/08 0754) Pulse Rate:  [68-99] 96 (01/08 0754) Resp:  [15-30] 20 (01/08 0534) BP: (122-167)/(77-102) 159/90 (01/08 0754) SpO2:  [89 %-100 %] 95 % (01/08 0534) Last BM Date : 07/04/23  Weight change: There were no vitals filed for this visit.  Intake/Output:   Intake/Output Summary (Last 24 hours) at 07/09/2023 0935 Last data filed at 07/09/2023 0534 Gross per 24 hour  Intake 630.93 ml  Output 4660 ml  Net -4029.07 ml      Physical Exam  General:  well appearing.  No respiratory difficulty HEENT: normal Neck: supple. JVD ~8 cm. Carotids 2+ bilat; no bruits. No lymphadenopathy or thyromegaly appreciated. Cor: PMI nondisplaced. Regular rate & rhythm. No rubs, gallops or murmurs. Lungs: clear Abdomen: soft, nontender, nondistended. No hepatosplenomegaly. No bruits or masses. Good bowel sounds. Extremities: no cyanosis, clubbing,  no edema in the bilateral lower extremities. Neuro: alert & oriented x 3, cranial nerves grossly intact. moves all 4 extremities w/o difficulty. Affect pleasant.   Telemetry   Not on telemetry.  (Personally reviewed)    EKG / cardiac studies    No new EKG to review  Echo: 07/05/2022  1. Left ventricular ejection fraction, by estimation, is 50 to 55%. The  left ventricle has low normal function. The left ventricle has no regional  wall motion abnormalities. There is mild asymmetric left ventricular  hypertrophy of the septal segment.  Left ventricular diastolic parameters were normal.   2. Right ventricular systolic function is normal. The right ventricular  size is normal. Tricuspid regurgitation signal is inadequate for assessing  PA  pressure.   3. The mitral valve is grossly normal. Mild mitral valve regurgitation.   4. The aortic valve is tricuspid. Aortic valve regurgitation is not  visualized.   5. Aortic dilatation noted. There is mild dilatation of the ascending  aorta, measuring 38 mm.   6. The inferior vena cava is normal in size with greater than 50%  respiratory variability, suggesting right atrial pressure of 3 mmHg.   Comparison(s): No prior Echocardiogram.   Labs    CBC Recent Labs    07/08/23 1703 07/09/23 0543  WBC 15.6* 14.8*  NEUTROABS 12.4* 11.2*  HGB 10.9* 10.5*  HCT 33.9* 33.2*  MCV 85.4 85.8  PLT 263 271   Basic Metabolic Panel Recent Labs    98/93/74 0520 07/07/23 2016 07/08/23 1703 07/09/23 0543  NA 134*   < > 135 136  K 4.1   < > 4.1 4.3  CL 103   < > 100 103  CO2 21*   < > 25 23  GLUCOSE 93   < > 96 96  BUN 16   < > 20 26*  CREATININE 0.97   < > 1.20* 1.19*  CALCIUM  8.3*   < > 7.3* 7.3*  MG 3.6*  --   --   --    < > = values in this interval not displayed.   Liver Function Tests Recent Labs    07/08/23 1703 07/09/23 0543  AST 32 24  ALT 30 26  ALKPHOS 106 102  BILITOT 0.4 0.3  PROT 6.6 6.0*  ALBUMIN  2.1* 2.0*   No results for input(s): LIPASE, AMYLASE in the last 72 hours. Cardiac Enzymes No results for input(s): CKTOTAL, CKMB, CKMBINDEX, TROPONINI in the last 72 hours.  BNP: BNP (last 3 results) Recent Labs    07/06/23 1849  BNP 538.6*    ProBNP (last 3 results) No results for input(s): PROBNP in the last 8760 hours.   D-Dimer No results for input(s): DDIMER in the last 72 hours. Hemoglobin A1C No results for input(s): HGBA1C in the last 72 hours. Fasting Lipid Panel No results for input(s): CHOL, HDL, LDLCALC, TRIG, CHOLHDL, LDLDIRECT in the last 72 hours. Thyroid  Function Tests No results for input(s): TSH, T4TOTAL, T3FREE, THYROIDAB in the last 72 hours.  Invalid input(s): FREET3  Other  results:   Imaging    No results found.   Medications:     Scheduled Medications:  acetaminophen   1,000 mg Oral Q6H   carvedilol   12.5 mg Oral BID WC   Chlorhexidine  Gluconate Cloth  6 each Topical Daily   enoxaparin  (LOVENOX ) injection  40 mg Subcutaneous Q24H   ferrous sulfate   325 mg Oral QODAY   furosemide   20 mg Oral Daily   ibuprofen   600 mg Oral Q6H   NIFEdipine   60 mg Oral BID   pantoprazole   40 mg Oral BID   prenatal multivitamin  1 tablet Oral Q1200   senna-docusate  2 tablet Oral Daily   simethicone   80 mg Oral TID PC    Infusions:    PRN Medications: acetaminophen , coconut oil, cyclobenzaprine , witch hazel-glycerin  **AND** dibucaine, diphenhydrAMINE , hydrOXYzine , ipratropium-albuterol , menthol -cetylpyridinium, mouth rinse, oxyCODONE , simethicone , zolpidem     Patient Profile   TYNIESHA HOWALD is a 33 y.o. female with hypertension, hx preeclampsia in 2019 and obesity. Admitted 07/03/23 with hypertensive emergency. Now s/p emergency c-section and tubal ligation 07/07/23. AHF team to see   Assessment/Plan  Hypertensive emergency>> preeclampsia - s/p emergency c-section and tubal ligation 07/07/23. OB following -Was on labetalol  earlier this hospitalization but held secondary to bradycardia.   -On carvedilol  12.5 mg p.o. twice daily.  Will increase it to 25 mg p.o. twice daily with holding parameters.   -Remains on nifedipine , increase as needed. - Will hold off on losartan /olmesartan  for now with Toradol  and ibuprofen . Plan to add once off of these medications.  - Echo showed EF 50-55%, normal RV, mild MR (read as Mod MR per Dr. Gardenia) - Continue Mg for 24 hrs per OB -She is already transitioned from IV Lasix  80 mg IV push twice daily to Lasix  20 mg p.o. daily. -Should be evaluated for sleep apnea as outpatient. -Outpatient renal artery duplex to evaluate for renal artery stenosis. -Currently nursing and therefore should be cognizant with medical therapy.    MR - Moderate MR on echo -Likely secondary to hypertensive emergency and volume overload.  Should improve. -Repeat echocardiogram as outpatient to reassess MR.     Acute respiratory distress - Per CCM - Suspect flash pulmonary edema with mod MR - Stable now on RA   Elevated troponin's - HsTrop 152>140, demand ischemia in the setting of hypertensive emergency and volume overload. - Denies CP and EKG has not illustrated ACS.  Length of Stay: 6  Melaya Hoselton [place], DO, [facility]  [facility]  [address] [place], [place] [address] Pager: [phone] Office: [phone] 9:40 AM 07/09/23

## 2023-07-09 NOTE — Progress Notes (Signed)
 Subjective: Postpartum Day 2: Cesarean Delivery Patient reports some incisional pain this morning.  Patient admits to not ambulating much this overnight. She is tolerating a regular diet. She denies chest pain, shortness of breath, lightheadedness/dizziness  Objective: Vital signs in last 24 hours: Temp:  [97.9 F (36.6 C)-98.1 F (36.7 C)] 98.1 F (36.7 C) (01/08 0534) Pulse Rate:  [63-99] 78 (01/08 0534) Resp:  [15-30] 20 (01/08 0534) BP: (122-168)/(77-103) 153/77 (01/08 0534) SpO2:  [89 %-100 %] 95 % (01/08 0534)  Physical Exam:  General: alert, cooperative, and no distress Lochia: appropriate Uterine Fundus: firm Incision: honeycomb dressing, clean, dry and intact- pressure dressing removed DVT Evaluation: No evidence of DVT seen on physical exam.  Recent Labs    07/08/23 1703 07/09/23 0543  HGB 10.9* 10.5*  HCT 33.9* 33.2*    Assessment/Plan: Status post Cesarean section. Doing well postoperatively.  Patient completed magnesium  sulfate for seizure prophylaxis BP remain elevated- Procardia  increased to 60 mg twice daily with first dose given last night Will add lasix  daily Continue monitoring BP Plan for discharge home tomorrow if stable.  Winton Felt, MD 07/09/2023, 7:38 AM

## 2023-07-09 NOTE — Progress Notes (Signed)
 Nutrition Education Note  RD consulted for nutrition education regarding a Heart Healthy diet.   Lipid Panel     Component Value Date/Time   CHOL 119 02/13/2023 1652   CHOL 105 09/30/2019 1530   TRIG 47 02/13/2023 1652   HDL 43 (L) 02/13/2023 1652   HDL 36 (L) 09/30/2019 1530   CHOLHDL 2.8 02/13/2023 1652   VLDL 12 09/29/2015 0957   LDLCALC 63 02/13/2023 1652    RD provided Heart Healthy Nutrition Therapy handout from the [employer]. Reviewed patient's dietary recall. Provided examples on ways to decrease sodium and fat intake in diet. Discouraged intake of processed foods and use of salt shaker. Encouraged fresh fruits and vegetables as well as whole grain sources of carbohydrates to maximize fiber intake. Teach back method used.  Expect good compliance. Patient plans weight loss post partum. Has history of 35 Lb weight loss prior to pregnancy  Body mass index is 47.82 kg/m. Pt meets criteria for morbid obesity based on current BMI.  Current diet order is Regular per patient request. Labs and medications reviewed. No further nutrition interventions warranted at this time.  If additional nutrition issues arise, please re-consult RD.

## 2023-07-09 NOTE — Plan of Care (Signed)
 Problem: Education: Goal: Knowledge of disease or condition will improve Outcome: Progressing Goal: Knowledge of the prescribed therapeutic regimen will improve Outcome: Progressing   Problem: Fluid Volume: Goal: Peripheral tissue perfusion will improve Outcome: Progressing   Problem: Clinical Measurements: Goal: Complications related to disease process, condition or treatment will be avoided or minimized Outcome: Progressing   Problem: Education: Goal: Knowledge of disease or condition will improve Outcome: Progressing Goal: Knowledge of the prescribed therapeutic regimen will improve Outcome: Progressing Goal: Individualized Educational Video(s) Outcome: Progressing   Problem: Clinical Measurements: Goal: Complications related to the disease process, condition or treatment will be avoided or minimized Outcome: Progressing   Problem: Education: Goal: Knowledge of General Education information will improve Description: Including pain rating scale, medication(s)/side effects and non-pharmacologic comfort measures Outcome: Progressing   Problem: Health Behavior/Discharge Planning: Goal: Ability to manage health-related needs will improve Outcome: Progressing   Problem: Clinical Measurements: Goal: Ability to maintain clinical measurements within normal limits will improve Outcome: Progressing Goal: Will remain free from infection Outcome: Progressing Goal: Diagnostic test results will improve Outcome: Progressing Goal: Respiratory complications will improve Outcome: Progressing Goal: Cardiovascular complication will be avoided Outcome: Progressing   Problem: Activity: Goal: Risk for activity intolerance will decrease Outcome: Progressing   Problem: Nutrition: Goal: Adequate nutrition will be maintained Outcome: Progressing   Problem: Coping: Goal: Level of anxiety will decrease Outcome: Progressing   Problem: Elimination: Goal: Will not experience  complications related to bowel motility Outcome: Progressing Goal: Will not experience complications related to urinary retention Outcome: Progressing   Problem: Pain Management: Goal: General experience of comfort will improve Outcome: Progressing   Problem: Safety: Goal: Ability to remain free from injury will improve Outcome: Progressing   Problem: Skin Integrity: Goal: Risk for impaired skin integrity will decrease Outcome: Progressing   Problem: Education: Goal: Knowledge of the prescribed therapeutic regimen will improve Outcome: Progressing Goal: Understanding of sexual limitations or changes related to disease process or condition will improve Outcome: Progressing Goal: Individualized Educational Video(s) Outcome: Progressing   Problem: Self-Concept: Goal: Communication of feelings regarding changes in body function or appearance will improve Outcome: Progressing   Problem: Skin Integrity: Goal: Demonstration of wound healing without infection will improve Outcome: Progressing   Problem: Education: Goal: Knowledge of Childbirth will improve Outcome: Progressing Goal: Ability to make informed decisions regarding treatment and plan of care will improve Outcome: Progressing Goal: Ability to state and carry out methods to decrease the pain will improve Outcome: Progressing Goal: Individualized Educational Video(s) Outcome: Progressing   Problem: Coping: Goal: Ability to verbalize concerns and feelings about labor and delivery will improve Outcome: Progressing   Problem: Life Cycle: Goal: Ability to make normal progression through stages of labor will improve Outcome: Progressing Goal: Ability to effectively push during vaginal delivery will improve Outcome: Progressing   Problem: Role Relationship: Goal: Will demonstrate positive interactions with the child Outcome: Progressing   Problem: Safety: Goal: Risk of complications during labor and delivery will  decrease Outcome: Progressing   Problem: Pain Management: Goal: Relief or control of pain from uterine contractions will improve Outcome: Progressing   Problem: Education: Goal: Knowledge of condition will improve Outcome: Progressing Goal: Individualized Educational Video(s) Outcome: Progressing Goal: Individualized Newborn Educational Video(s) Outcome: Progressing   Problem: Activity: Goal: Will verbalize the importance of balancing activity with adequate rest periods Outcome: Progressing Goal: Ability to tolerate increased activity will improve Outcome: Progressing   Problem: Education: Goal: Knowledge of disease or condition will improve Outcome: Progressing Goal: Knowledge  of the prescribed therapeutic regimen will improve Outcome: Progressing   Problem: Fluid Volume: Goal: Peripheral tissue perfusion will improve Outcome: Progressing   Problem: Clinical Measurements: Goal: Complications related to disease process, condition or treatment will be avoided or minimized Outcome: Progressing

## 2023-07-09 NOTE — Progress Notes (Signed)
 3 small areas noted skin tear lower right abdomen post MD removal of pressure dressing. Dr. Debroah Loop made aware. Vaseline and loose 2x2 dressing applied with paper tape.

## 2023-07-09 NOTE — Lactation Note (Signed)
 This note was copied from a baby's chart.  NICU Lactation Consultation Note  Patient Name: Shirley [name] Date: 07/09/2023 Age:35 hours  Reason for consult: Follow-up assessment; NICU baby; 1st time breastfeeding; Other (Comment); Preterm <34wks; Infant < 6lbs (cHTN (severe) BMI 50, former smoker)  SUBJECTIVE Visited with family of 2 17/59 weeks old AGA NICU female; [name] reported she's been pumping for baby Shirley [name] but she's no longer getting enough volume to collect, she voiced it's nothing to drops now. Noticed that pumping hasn't been consistent. Explained that the importance of consistent pumping is mainly for breast stimulation and not to get volume, she voiced understanding. Reviewed pumping schedule, pumping log, lactogenesis II, and anticipatory guidelines. Noticed that she was still using the colostrum containers past the 24 hour period, discarded them. She had a question regarding smoking and providing milk for Shirley [name], she is no planning on going back to smoking but wanted to know if her milk will still be safe for baby in case she does. Let her know that the benefits of premature milk will outweigh the risks and that the best scenario would be to avoid smoking all together if possible. She also inquired about breastmilk storage guidelines since Shirley [name] was put back on NPO, they were also reviewed.   OBJECTIVE Infant data: Mother's Current Feeding Choice: -- (NPO)  O2 Device: CPAP FiO2 (%): 29 %  Maternal data: [id number] C-Section, Low Transverse Has patient been taught Hand Expression?: Yes Hand Expression Comments: milk easily expressed Significant Breast History:: ++ breast changes Current breast feeding challenges:: infant separation, late initiation of pumping Does the patient have breastfeeding experience prior to this delivery?: No Pumping frequency: 2 times/24 hours Pumped volume: 1 mL (drops) Flange Size: 24 Hands-free pumping top sizes: X-Large [name]) Risk  factor for low/delayed milk supply:: C/S, prematurity, BMI 50, MOB in cardiac ICU X 24 hours, infant separation  WIC Program: No (Did not sign up for [facility] yet) WIC Referral Sent?: No Pump: Stork Pump (Spectra  S2)  ASSESSMENT Infant: Feeding Status: NPO Feeding method: Tube/Gavage (Bolus)  Maternal: Milk volume: Normal  INTERVENTIONS/PLAN Interventions: Interventions: Breast feeding basics reviewed; DEBP; Education; NICU Pumping Log; Coconut oil Tools: Pump; Flanges; Hands-free pumping top; Coconut oil Pump Education: Setup, frequency, and cleaning; Milk Storage  Plan: Encouraged pumping every 3 hours, ideally 8 pumping sessions/24 hours Breast massage, hand expression and coconut oil were also encouraged prior pumping  Family friend (one of her kid's godmother) present. All questions and concerns answered, family to contact [facility] services PRN.  Consult Status: NICU follow-up NICU Follow-up type: Maternal D/C visit   Shirley [name] 07/09/2023, 1:14 PM

## 2023-07-10 ENCOUNTER — Other Ambulatory Visit (HOSPITAL_COMMUNITY): Payer: Self-pay

## 2023-07-10 DIAGNOSIS — I10 Essential (primary) hypertension: Secondary | ICD-10-CM

## 2023-07-10 DIAGNOSIS — I340 Nonrheumatic mitral (valve) insufficiency: Secondary | ICD-10-CM

## 2023-07-10 LAB — COMPREHENSIVE METABOLIC PANEL
ALT: 21 U/L (ref 0–44)
AST: 20 U/L (ref 15–41)
Albumin: 2 g/dL — ABNORMAL LOW (ref 3.5–5.0)
Alkaline Phosphatase: 102 U/L (ref 38–126)
Anion gap: 7 (ref 5–15)
BUN: 18 mg/dL (ref 6–20)
CO2: 24 mmol/L (ref 22–32)
Calcium: 7.8 mg/dL — ABNORMAL LOW (ref 8.9–10.3)
Chloride: 104 mmol/L (ref 98–111)
Creatinine, Ser: 0.94 mg/dL (ref 0.44–1.00)
GFR, Estimated: >60 mL/min (ref >60)
Glucose, Bld: 100 mg/dL — ABNORMAL HIGH (ref 70–99)
Potassium: 4.6 mmol/L (ref 3.5–5.1)
Sodium: 135 mmol/L (ref 135–145)
Total Bilirubin: 0.4 mg/dL (ref 0.0–1.2)
Total Protein: 6.3 g/dL — ABNORMAL LOW (ref 6.5–8.1)

## 2023-07-10 LAB — CBC
HCT: 31.6 % — ABNORMAL LOW (ref 36.0–46.0)
Hemoglobin: 10.1 g/dL — ABNORMAL LOW (ref 12.0–15.0)
MCH: 27.4 pg (ref 26.0–34.0)
MCHC: 32 g/dL (ref 30.0–36.0)
MCV: 85.6 fL (ref 80.0–100.0)
Platelets: 293 10*3/uL (ref 150–400)
RBC: 3.69 MIL/uL — ABNORMAL LOW (ref 3.87–5.11)
RDW: 15.9 % — ABNORMAL HIGH (ref 11.5–15.5)
WBC: 14.9 10*3/uL — ABNORMAL HIGH (ref 4.0–10.5)
nRBC: 0.1 % (ref 0.0–0.2)

## 2023-07-10 MED ORDER — OXYCODONE HCL 5 MG PO TABS
5.0000 mg | ORAL_TABLET | ORAL | 0 refills | Status: DC | PRN
  Filled 2023-07-10: qty 30, 3d supply, fill #0

## 2023-07-10 MED ORDER — IBUPROFEN 600 MG PO TABS
600.0000 mg | ORAL_TABLET | Freq: Four times a day (QID) | ORAL | 0 refills | Status: DC | PRN
  Filled 2023-07-10: qty 30, 8d supply, fill #0

## 2023-07-10 MED ORDER — FUROSEMIDE 20 MG PO TABS
20.0000 mg | ORAL_TABLET | Freq: Every day | ORAL | 0 refills | Status: DC
  Filled 2023-07-10: qty 30, 30d supply, fill #0

## 2023-07-10 MED ORDER — CARVEDILOL 25 MG PO TABS
25.0000 mg | ORAL_TABLET | Freq: Two times a day (BID) | ORAL | 0 refills | Status: DC
  Filled 2023-07-10: qty 30, 15d supply, fill #0

## 2023-07-10 MED ORDER — NIFEDIPINE ER 60 MG PO TB24
60.0000 mg | ORAL_TABLET | Freq: Two times a day (BID) | ORAL | 0 refills | Status: DC
  Filled 2023-07-10: qty 60, 30d supply, fill #0

## 2023-07-10 NOTE — Discharge Summary (Signed)
 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, antepartum (HCC) Active Problems:   Class 3 severe obesity with serious comorbidity and body mass index (BMI) of 50.0 to 59.9 in adult [facility])   Chronic hypertension affecting pregnancy   Supervision of high risk pregnancy, antepartum   Iron  deficiency anemia   Elevated hemoglobin A1c   Cystic fibrosis carrier   Alpha thalassemia silent carrier   Hx of preeclampsia, prior pregnancy, currently pregnant, second trimester   Hypertensive emergency   Acute pulmonary edema (HCC)   Pre-eclampsia, antepartum   Elevated troponin I level   Nonrheumatic mitral valve regurgitation  Additional problems: none    Discharge diagnosis: Preterm Pregnancy Delivered and CHTN with superimposed preeclampsia                                              Post partum procedures:  Hospital course: Sceduled C/S   32 y.o. yo [id number] at [redacted]w[redacted]d was admitted to the hospital 07/03/2023 for [facility] with superimposed pre-eclampsia. During the course of her admission, patient became short of breath and was noted to have pulmonary edema. Decision was made to proceed with cesarean section. Delivery details are as follows:  Membrane Rupture Time/Date: 12:40 PM,07/07/2023  Delivery Method:C-Section, Low Transverse Details of operation can be found in separate operative note.  Patient had a postpartum course complicated by cardiomyopathy. She received lasix  and responded well. Patient completed magnesium  sulfate for seizure prophylaxis. Blood pressure management improved with the addition of Cored to her nifedipine  and  lasix .  She is ambulating, tolerating a regular diet, passing flatus, and urinating well. Patient is discharged home in stable condition on  07/10/23. Discharge precautions reviewed with plan to follow up in the office in 1 week for BP and incision check. Patient also has follow up with cardiology        Newborn Data: Birth date:07/07/2023 Birth time:1:18 PM Gender:Female Living status:Living Apgars:6 ,8  Weight:1220 g    Magnesium  Sulfate received: Yes: Seizure prophylaxis   Immunizations received: There is no immunization history for the selected administration types on file for this patient.  Physical exam  Vitals:   07/09/23 2041 07/09/23 2314 07/10/23 0405 07/10/23 0827  BP:  (!) 142/79 (!) 156/96 (!) 147/84  Pulse:  76 86 89  Resp:  18 18 18   Temp:  (!) 97.5 F (36.4 C) 97.8 F (36.6 C) 97.8 F (36.6 C)  TempSrc:  Oral Oral Oral  SpO2:  98% 95% 97%  Weight: (!) 138 kg     Height: 5' 7 (1.702 m)      General: alert, cooperative, and no distress Lochia: appropriate Uterine Fundus: firm Incision: Dressing is clean, dry, and intact DVT Evaluation: No evidence of DVT seen on physical exam. Labs: Lab Results  Component Value Date   WBC 14.9 (H) 07/10/2023   HGB 10.1 (L) 07/10/2023   HCT 31.6 (L) 07/10/2023   MCV 85.6 07/10/2023   PLT 293 07/10/2023      Latest Ref Rng & Units 07/10/2023    6:17 AM  CMP  Glucose 70 - 99 mg/dL 899   BUN 6 - 20 mg/dL 18   Creatinine 9.55 - 1.00 mg/dL 9.05   Sodium 864 - 854 mmol/L 135   Potassium 3.5 - 5.1 mmol/L 4.6   Chloride 98 - 111 mmol/L 104   CO2 22 - 32 mmol/L 24   Calcium  8.9 - 10.3 mg/dL 7.8   Total Protein 6.5 - 8.1 g/dL 6.3   Total Bilirubin 0.0 - 1.2 mg/dL 0.4   Alkaline Phos 38 - 126 U/L 102   AST 15 - 41 U/L 20   ALT 0 - 44 U/L 21    Edinburgh Score:    07/10/2023    5:14 AM  Edinburgh Postnatal Depression Scale Screening Tool  I have been able to laugh and see the funny side of things. 1  I have looked  forward with enjoyment to things. 0  I have blamed myself unnecessarily when things went wrong. 2  I have been anxious or worried for no good reason. 2  I have felt scared or panicky for no good reason. 2  Things have been getting on top of me. 2  I have been so unhappy that I have had difficulty sleeping. 2  I have felt sad or miserable. 2  I have been so unhappy that I have been crying. 2  The thought of harming myself has occurred to me. 0  Edinburgh Postnatal Depression Scale Total 15   Edinburgh Postnatal Depression Scale Total: (!) 15   After visit meds:  Allergies as of 07/10/2023   No Known Allergies      Medication List     STOP taking these medications    aspirin  EC 81 MG tablet   labetalol  200 MG tablet Commonly known as: NORMODYNE    metroNIDAZOLE  500 MG tablet Commonly known as: FLAGYL        TAKE these medications    Blood Pressure Monitoring Devi 1 each by Does not apply route once a week.   carvedilol  25 MG tablet Commonly known as: COREG  Take 1 tablet (25 mg total) by mouth 2 (two) times daily with a meal.   cyclobenzaprine  10 MG tablet Commonly known as: FLEXERIL  Take 1 tablet (10 mg total) by mouth 3 (three) times daily as needed for muscle spasms.   ferrous sulfate  325 (65 FE) MG EC tablet Take 1 tablet (325 mg total) by mouth every other day.   furosemide  20 MG tablet Commonly known as: LASIX  Take 1 tablet (20 mg total) by mouth daily. Start taking on: July 11, 2023   ibuprofen  600 MG tablet Commonly known as: ADVIL  Take 1 tablet (600 mg total) by mouth every 6 (six) hours as needed for cramping, headache or fever.   metFORMIN  500 MG tablet Commonly known as: GLUCOPHAGE  Take 1 tablet (500 mg total) by mouth daily in the evening with largest meal.   NIFEdipine  60 MG 24 hr tablet Commonly known as: ADALAT  CC Take 1 tablet (60 mg total) by mouth 2 (two) times daily. What changed:  medication strength when to take this    oxyCODONE  5 MG immediate release tablet Commonly known as: Oxy IR/ROXICODONE  Take 1-2 tablets (5-10 mg total) by mouth every 4 (four) hours as needed for moderate pain (pain score 4-6).   PrePLUS 27-1 MG Tabs Take 1 tablet by mouth daily.         Discharge home in stable condition Infant Feeding: Bottle Infant Disposition:NICU Discharge instruction: per After Visit Summary and  Postpartum booklet. Activity: Advance as tolerated. Pelvic rest for 6 weeks.  Diet: low salt diet Future Appointments: Future Appointments  Date Time Provider Department Center  07/23/2023  9:40 AM Gardenia Led, DO [facility] None  07/30/2023 10:10 AM Cleotilde Perkins, DO [facility]  08/20/2023 10:55 AM Wyn, Jackee VEAR Raddle., NP [facility]   Follow up Visit:  Follow-up Information     Joshua Domino, DO Follow up.   Specialty: Family Medicine Why: Wednesday 07/30/2023 at 10:10 am, hospital follow up. please call if you are unable to keep appt Contact information: [address] [place] [address] [phone]         Wyn Jackee VEAR Raddle., NP Follow up.   Specialty: Cardiology Why: Wednesday Aug 20, 2023 Arrive by 10:40 AMAppt at 10:55 AM (25 min) Contact information: [address] [place] [address] [phone]         Gardenia Led, DO Follow up.   Specialty: Cardiology Why: 1/22 at 9:40 Contact information: [address] [place] [address] [phone]         [facility] Follow up in 1 week(s).   Specialty: Obstetrics and Gynecology Why: An appointment will be made for you to check on your incision and your blood pressure Contact information: [address] [place]   [address] [phone]                    07/10/2023 Winton Felt, MD

## 2023-07-10 NOTE — Lactation Note (Signed)
 This note was copied from a baby's chart.  NICU Lactation Consultation Note  Patient Name: Shirley [name] Unijb'd Date: 07/10/2023 Age:33 years  Reason for consult: Follow-up assessment; NICU baby; 1st time breastfeeding; Other (Comment); Maternal discharge; Preterm <34wks (cHTN (severe) BMI 50, former smoker)  SUBJECTIVE Visited with family of 63 59/46 weeks old AGA NICU female; Ms. Corrigan is a P4 and reports she's been pumping and getting enough to collect now, praised her for her efforts. She voiced she pumped for an hour and her nipples got sore. Re-educated about the importance of consistent pumping for 15 minutes at a time every 3 hours. Ms. Barich is getting [facility] from [facility]. Reviewed discharge education, pump settings and the importance of consistent pumping for the onset of lactogenesis II and the prevention of engorgement.   OBJECTIVE Infant data: Mother's Current Feeding Choice: -- (NPO)  O2 Device: CPAP FiO2 (%): 24 %   Maternal data: [id number] C-Section, Low Transverse Pumping frequency: 4 times/24 hours Pumped volume: 5 mL Flange Size: 24 Hands-free pumping top sizes: X-Large [name]) Risk factor for low/delayed milk supply:: C/S, prematurity, BMI 50, MOB in cardiac ICU X 24 hours, infant separation  WIC Program: No (Did not sign up for [facility] yet) WIC Referral Sent?: No Pump: Hands Free, Stork Pump (Spectra  S2 and wearable pump at home)  ASSESSMENT Infant: Feeding Status: NPO Feeding method: Tube/Gavage (Bolus)  Maternal: Milk volume: Normal  INTERVENTIONS/PLAN Interventions: Interventions: Breast feeding basics reviewed; DEBP; Education; Coconut oil Discharge Education: Engorgement and breast care Tools: Pump; Flanges; Coconut oil; Hands-free pumping top Pump Education: Setup, frequency, and cleaning; Milk Storage  Plan: Encouraged pumping every 3 hours, ideally 8 pumping sessions/24 hours She'll take all pump parts to baby's room after her  discharge She'll switch pump settings from initiate to maintain mode once she starts getting 20 ml of EBM combined   Female visitor present. All questions and concerns answered, family to contact [facility] services PRN.  Consult Status: NICU follow-up NICU Follow-up type: Verify onset of copious milk; Verify absence of engorgement   Gianni Fuchs S Caley Volkert 07/10/2023, 12:27 PM

## 2023-07-10 NOTE — Clinical Social Work Maternal (Signed)
 CLINICAL SOCIAL WORK MATERNAL/CHILD NOTE  Patient Details  Name: Shirley [name] MRN: [id number] Date of Birth: 1991/04/29  Date:  07/10/2023  Clinical Social Worker Initiating Note:  [place], [place] Date/Time: Initiated:  07/10/23/1053     Child's Name:  Tovorris Beverley Raddle.   Biological Parents:    Father: Tovorris Murphy  Need for Interpreter:  None   Reason for Referral:  Parental Support of Premature Babies < 32 weeks/or Critically Ill babies, Behavioral Health Concerns (Edinburgh score 15)   Address:   [address] [place] [address]   Phone number:  [phone] (home)     Additional phone number:   Household Members/Support Persons (HM/SP):   Household Member/Support Person 1, Household Member/Support Person 2, Household Member/Support Person 3   HM/SP Name Relationship DOB or Age  HM/SP -1 Tacey Shuck daughter 07/31/05  HM/SP -2 Richie Shuck daughter 05/19/09  HM/SP -3 Ozell Fabry son 11/02/17  HM/SP -4        HM/SP -5        HM/SP -6        HM/SP -7        HM/SP -8          Natural Supports (not living in the home):  Immediate Family, Other (Comment) (Children; FOBs)   Professional Supports: None   Employment: Homemaker   Type of Work:     Education:  9 to 11 years (9th Grade)   Homebound arranged: No  Financial Resources:  [employer]   Other Resources:  Archivist Considerations Which May Impact Care:    Strengths:  Understanding of illness, Ability to meet basic needs     Psychotropic Medications:         Pediatrician:       Pediatrician List: Provided a list of pediatricians   [employer]    [place]    [facility]      Pediatrician Fax Number:    Risk Factors/Current Problems:  Scientist, Product/process Development:  Alert  , Able to Concentrate  , Goal Oriented  , Linear Thinking     Mood/Affect:  Calm  , Interested  ,  Euthymic     CSW Assessment: CSW met with MOB at bedside to complete psychosocial assessment, MOB accompanied by her daughter. CSW introduced self. MOB granted CSW verbal permission to speak in front of her daughter about anything. MOB was welcoming, pleasant, and remained engaged during assessment. MOB reported that she resides with her children. MOB reported that she receives food stamps and needs to make an appointment for [facility]. MOB shared that she has been sick for a while and has missed several appointments with [facility] and they informed her to reach out when she can. CSW asked if MOB needed CSW to complete a [facility] referral, MOB agreed. CSW agreed to complete [facility] referral. MOB reported that she has not started to shop for infant, noting she has been too sick to do anything. CSW informed MOB about [employer]'s [employer] if any assistance is needed obtaining items for infant. MOB reported that a referral for all essential items and a pack n play would be helpful. CSW agreed to make a referral. CSW inquired about MOB's support system, MOB reported that her children and FOBs are supports.   CSW inquired about MOB's mental health history. MOB denied any mental  health history. CSW inquired about any history of postpartum depression, MOB endorsed experiencing postpartum depression after her last pregnancy. MOB reported that it lasted for approximately a year and described her postpartum depression as sadness and isolation. MOB reported that she did not participate in any treatment and noted that the symptoms subsided on their own. CSW and MOB discussed elevated edinburgh score 15. MOB attributed elevated edinburgh score to infant's NICU admission. CSW and MOB discussed emotions that can arise during a NICU admission. CSW emphasized the importance of MOB caring for herself during this time. CSW inquired about how MOB was feeling emotionally since giving birth, MOB reported that she was feeling worried  about infant. CSW acknowledged, normalized, and validated MOB's feelings. MOB presented calm and did not demonstrate any acute mental health signs/symptoms. CSW assessed for safety, MOB denied SI, HI, and domestic violence.   CSW provided education regarding the baby blues period vs. perinatal mood disorders, discussed treatment and gave resources for mental health follow up if concerns arise.  CSW recommends self-evaluation during the postpartum time period using the New Mom Checklist from Postpartum Progress and encouraged MOB to contact a medical professional if symptoms are noted at any time.    CSW informed MOB about the hospital drug screen policy due to limited prenatal care. MOB denied any barriers to getting to her prenatal care visits. MOB reported that she doesn't think she'll encounter any barriers with getting infant to the pediatrician. CSW informed MOB about Medicaid transportation as a resource if needed. CSW provided contact information for Medicaid transportation. CSW informed MOB that infant's UDS and CDS would be monitored and a CPS report would be made if warranted. MOB denied any substance use during pregnancy. MOB verbalized understanding and denied any questions regarding the hospital drug screen policy.   CSW and MOB discussed infant's NICU admission. CSW informed MOB about the NICU, what to expect, and resources/supports available while infant is admitted to the NICU. MOB reported that she feels informed about infant's care and denied any transportation barriers with visiting infant in the NICU. MOB denied any questions/concerns regarding the NICU. CSW informed MOB that infant qualifies to apply for SSI benefits, MOB reported that she was interested. CSW went over application process and provided information, MOB denied any questions.   CSW inquired about any additional needs for resources/supports, MOB reported none.   CSW will continue to offer resources/supports while infant is  admitted to the NICU. MOB opted for CSW to check in weekly.   CSW made FSN referral for requested items.   CSW completed [facility] referral.   CSW Plan/Description:  Perinatal Mood and Anxiety Disorder (PMADs) Education, Psychosocial Support and Ongoing Assessment of Needs, Hospital Drug Screen Policy Information, CSW Will Continue to Monitor Umbilical Cord Tissue Drug Screen Results and Make Report if Warranted, [employer] Income (SSI) Information, Other Patient/Family Education, Other Information/Referral to [employer], [place] 07/10/2023, 10:56 AM

## 2023-07-10 NOTE — Progress Notes (Signed)
 Cardiologist: None  Chief Complaint:  Subjective:    No events overnight  BP improving  Tentative being scheduled for discharge later today.  Objective:   Weight Range: (!) 138 kg Body mass index is 47.65 kg/m.   Vital Signs:   Temp:  [97.5 F (36.4 C)-97.8 F (36.6 C)] 97.8 F (36.6 C) (01/09 0827) Pulse Rate:  [76-91] 89 (01/09 0827) Resp:  [18-19] 18 (01/09 0827) BP: (127-156)/(79-96) 147/84 (01/09 0827) SpO2:  [95 %-98 %] 97 % (01/09 0827) Weight:  [861 kg] 138 kg (01/08 2041) Last BM Date : 07/04/23  Weight change: Filed Weights   07/09/23 2041  Weight: (!) 138 kg    Intake/Output:  No intake or output data in the 24 hours ending 07/10/23 1119     Physical Exam  General:  well appearing.  No respiratory difficulty HEENT: normal Neck: supple.  No JVP. Carotids 2+ bilat; no bruits. No lymphadenopathy or thyromegaly appreciated. Cor: PMI nondisplaced. Regular rate & rhythm. No rubs, gallops or murmurs. Lungs: clear Abdomen: soft, nontender, nondistended. No hepatosplenomegaly. No bruits or masses. Good bowel sounds. Extremities: no cyanosis, clubbing,  no edema in the bilateral lower extremities. Neuro: alert & oriented x 3, cranial nerves grossly intact. moves all 4 extremities w/o difficulty. Affect pleasant.   Telemetry   Not on telemetry.  (Personally reviewed)    EKG / cardiac studies    No new EKG to review  Echo: 07/05/2022  1. Left ventricular ejection fraction, by estimation, is 50 to 55%. The  left ventricle has low normal function. The left ventricle has no regional  wall motion abnormalities. There is mild asymmetric left ventricular  hypertrophy of the septal segment.  Left ventricular diastolic parameters were normal.   2. Right ventricular systolic function is normal. The right ventricular  size is normal. Tricuspid regurgitation signal is inadequate for assessing  PA pressure.   3. The mitral valve is grossly normal. Mild  mitral valve regurgitation.   4. The aortic valve is tricuspid. Aortic valve regurgitation is not  visualized.   5. Aortic dilatation noted. There is mild dilatation of the ascending  aorta, measuring 38 mm.   6. The inferior vena cava is normal in size with greater than 50%  respiratory variability, suggesting right atrial pressure of 3 mmHg.   Comparison(s): No prior Echocardiogram.   Labs    CBC Recent Labs    07/09/23 0543 07/09/23 1710 07/10/23 0617  WBC 14.8* 16.0* 14.9*  NEUTROABS 11.2* 12.0*  --   HGB 10.5* 10.8* 10.1*  HCT 33.2* 34.1* 31.6*  MCV 85.8 85.7 85.6  PLT 271 278 293   Basic Metabolic Panel Recent Labs    98/91/74 1710 07/10/23 0617  NA 135 135  K 4.5 4.6  CL 103 104  CO2 25 24  GLUCOSE 95 100*  BUN 20 18  CREATININE 1.02* 0.94  CALCIUM  7.5* 7.8*   Liver Function Tests Recent Labs    07/09/23 1710 07/10/23 0617  AST 24 20  ALT 25 21  ALKPHOS 110 102  BILITOT 0.3 0.4  PROT 6.6 6.3*  ALBUMIN 2.1* 2.0*   No results for input(s): LIPASE, AMYLASE in the last 72 hours. Cardiac Enzymes No results for input(s): CKTOTAL, CKMB, CKMBINDEX, TROPONINI in the last 72 hours.  BNP: BNP (last 3 results) Recent Labs    07/06/23 1849  BNP 538.6*    ProBNP (last 3 results) No results for input(s): PROBNP in the last 8760 hours.  D-Dimer No results for input(s): DDIMER in the last 72 hours. Hemoglobin A1C No results for input(s): HGBA1C in the last 72 hours. Fasting Lipid Panel No results for input(s): CHOL, HDL, LDLCALC, TRIG, CHOLHDL, LDLDIRECT in the last 72 hours. Thyroid  Function Tests No results for input(s): TSH, T4TOTAL, T3FREE, THYROIDAB in the last 72 hours.  Invalid input(s): FREET3  Other results:   Imaging    No results found.   Medications:     Scheduled Medications:  carvedilol   25 mg Oral BID WC   Chlorhexidine  Gluconate Cloth  6 each Topical Daily   enoxaparin  (LOVENOX )  injection  40 mg Subcutaneous Q24H   ferrous sulfate   325 mg Oral QODAY   furosemide   20 mg Oral Daily   NIFEdipine   60 mg Oral BID   pantoprazole   40 mg Oral BID   prenatal multivitamin  1 tablet Oral Q1200   senna-docusate  2 tablet Oral Daily   simethicone   80 mg Oral TID PC    Infusions:    PRN Medications: acetaminophen , coconut oil, cyclobenzaprine , witch hazel-glycerin  **AND** dibucaine, diphenhydrAMINE , hydrOXYzine , [EXPIRED] ketorolac  **FOLLOWED BY** ibuprofen , ipratropium-albuterol , menthol -cetylpyridinium, mouth rinse, oxyCODONE , simethicone     Patient Profile   Shirley [name] is a 33 y.o. female with hypertension, hx preeclampsia in 2019 and obesity. Admitted 07/03/23 with hypertensive emergency. Now s/p emergency c-section and tubal ligation 07/07/23. AHF team to see   Assessment/Plan  Hypertensive emergency>> preeclampsia - s/p emergency c-section and tubal ligation 07/07/23. OB following -Was on labetalol  earlier this hospitalization but held secondary to bradycardia.   -Carvedilol  was increased to 25 mg p.o. twice daily with holding parameters.   -Remains on nifedipine  60 mg p.o. twice daily with holding parameters.   -Continue Lasix  20 mg p.o. daily. - Will hold off on losartan /olmesartan  for now with Toradol  and ibuprofen . Plan to add once off of these medications.  - Echo showed EF 50-55%, normal RV, mild MR (read as Mod MR per Dr. Gardenia) -Should be evaluated for sleep apnea as outpatient. -Outpatient renal artery duplex to evaluate for renal artery stenosis. -Currently nursing and therefore should be cognizant with medical therapy. -Outpatient follow-up will be scheduled.   MR - Moderate MR on echo -Likely secondary to hypertensive emergency and volume overload.  Should improve. -Repeat echocardiogram as outpatient to reassess MR.     Acute respiratory distress -Has resolved. - Suspect flash pulmonary edema with mod MR - Stable now on RA   Elevated  troponin's - HsTrop 152>140, demand ischemia in the setting of hypertensive emergency and volume overload. - Denies CP and EKG has not illustrated ACS.  Plan of care has been discussed with Dr. Winton Felt via secure chat.  Tentative plan is to discharge the patient later today.  For any reason if she stays for extended period of time please reach out to cardiology as concerns or needs arise.  For now cardiology will sign off.  Length of Stay: 7  Laporchia Nakajima Sherman, DO, [facility]  [facility]  [address] [place], [place] [address] Pager: [phone] Office: [phone] 11:19 AM 07/10/23

## 2023-07-11 ENCOUNTER — Emergency Department (HOSPITAL_COMMUNITY): Payer: Medicaid Other

## 2023-07-11 ENCOUNTER — Other Ambulatory Visit: Payer: Self-pay

## 2023-07-11 ENCOUNTER — Emergency Department (HOSPITAL_COMMUNITY)
Admission: EM | Admit: 2023-07-11 | Discharge: 2023-07-12 | Disposition: A | Discharge status: Home / Self Care | Payer: Medicaid Other | Attending: Emergency Medicine | Admitting: Emergency Medicine

## 2023-07-11 ENCOUNTER — Encounter (HOSPITAL_COMMUNITY): Payer: Self-pay | Admitting: Emergency Medicine

## 2023-07-11 DIAGNOSIS — M50222 Other cervical disc displacement at C5-C6 level: Secondary | ICD-10-CM | POA: Diagnosis not present

## 2023-07-11 DIAGNOSIS — R29898 Other symptoms and signs involving the musculoskeletal system: Secondary | ICD-10-CM | POA: Diagnosis not present

## 2023-07-11 DIAGNOSIS — Z3A27 27 weeks gestation of pregnancy: Secondary | ICD-10-CM | POA: Insufficient documentation

## 2023-07-11 DIAGNOSIS — O99891 Other specified diseases and conditions complicating pregnancy: Secondary | ICD-10-CM | POA: Diagnosis present

## 2023-07-11 DIAGNOSIS — R000 Tachycardia, unspecified: Secondary | ICD-10-CM | POA: Diagnosis not present

## 2023-07-11 DIAGNOSIS — M4802 Spinal stenosis, cervical region: Secondary | ICD-10-CM | POA: Diagnosis not present

## 2023-07-11 DIAGNOSIS — O10912 Unspecified pre-existing hypertension complicating pregnancy, second trimester: Secondary | ICD-10-CM | POA: Insufficient documentation

## 2023-07-11 DIAGNOSIS — M5412 Radiculopathy, cervical region: Secondary | ICD-10-CM | POA: Diagnosis not present

## 2023-07-11 DIAGNOSIS — R202 Paresthesia of skin: Secondary | ICD-10-CM | POA: Diagnosis not present

## 2023-07-11 DIAGNOSIS — R531 Weakness: Secondary | ICD-10-CM | POA: Insufficient documentation

## 2023-07-11 DIAGNOSIS — I10 Essential (primary) hypertension: Secondary | ICD-10-CM | POA: Diagnosis not present

## 2023-07-11 DIAGNOSIS — M4803 Spinal stenosis, cervicothoracic region: Secondary | ICD-10-CM | POA: Diagnosis not present

## 2023-07-11 DIAGNOSIS — M50221 Other cervical disc displacement at C4-C5 level: Secondary | ICD-10-CM | POA: Diagnosis not present

## 2023-07-11 LAB — COMPREHENSIVE METABOLIC PANEL
ALT: 29 U/L (ref 0–44)
AST: 32 U/L (ref 15–41)
Albumin: 2.3 g/dL — ABNORMAL LOW (ref 3.5–5.0)
Alkaline Phosphatase: 114 U/L (ref 38–126)
Anion gap: 12 (ref 5–15)
BUN: 14 mg/dL (ref 6–20)
CO2: 23 mmol/L (ref 22–32)
Calcium: 8.7 mg/dL — ABNORMAL LOW (ref 8.9–10.3)
Chloride: 101 mmol/L (ref 98–111)
Creatinine, Ser: 0.96 mg/dL (ref 0.44–1.00)
GFR, Estimated: >60 mL/min (ref >60)
Glucose, Bld: 111 mg/dL — ABNORMAL HIGH (ref 70–99)
Potassium: 4.7 mmol/L (ref 3.5–5.1)
Sodium: 136 mmol/L (ref 135–145)
Total Bilirubin: 0.4 mg/dL (ref 0.0–1.2)
Total Protein: 7 g/dL (ref 6.5–8.1)

## 2023-07-11 LAB — CBC WITH DIFFERENTIAL/PLATELET
Abs Immature Granulocytes: 0.18 10*3/uL — ABNORMAL HIGH (ref 0.00–0.07)
Basophils Absolute: 0.1 10*3/uL (ref 0.0–0.1)
Basophils Relative: 1 %
Eosinophils Absolute: 0.2 10*3/uL (ref 0.0–0.5)
Eosinophils Relative: 2 %
HCT: 34.3 % — ABNORMAL LOW (ref 36.0–46.0)
Hemoglobin: 10.8 g/dL — ABNORMAL LOW (ref 12.0–15.0)
Immature Granulocytes: 1 %
Lymphocytes Relative: 20 %
Lymphs Abs: 2.6 10*3/uL (ref 0.7–4.0)
MCH: 27.4 pg (ref 26.0–34.0)
MCHC: 31.5 g/dL (ref 30.0–36.0)
MCV: 87.1 fL (ref 80.0–100.0)
Monocytes Absolute: 1 10*3/uL (ref 0.1–1.0)
Monocytes Relative: 7 %
Neutro Abs: 9.4 10*3/uL — ABNORMAL HIGH (ref 1.7–7.7)
Neutrophils Relative %: 69 %
Platelets: 334 10*3/uL (ref 150–400)
RBC: 3.94 MIL/uL (ref 3.87–5.11)
RDW: 15.9 % — ABNORMAL HIGH (ref 11.5–15.5)
WBC: 13.5 10*3/uL — ABNORMAL HIGH (ref 4.0–10.5)
nRBC: 0 % (ref 0.0–0.2)

## 2023-07-11 LAB — MAGNESIUM: Magnesium: 1.9 mg/dL (ref 1.7–2.4)

## 2023-07-11 LAB — CBG MONITORING, ED: Glucose-Capillary: 102 mg/dL — ABNORMAL HIGH (ref 70–99)

## 2023-07-11 LAB — TROPONIN I (HIGH SENSITIVITY): Troponin I (High Sensitivity): 42 ng/L — ABNORMAL HIGH (ref <18)

## 2023-07-11 MED ORDER — LORAZEPAM 2 MG/ML IJ SOLN
1.0000 mg | Freq: Once | INTRAMUSCULAR | Status: AC
  Administered 2023-07-12: 1 mg via INTRAVENOUS
  Filled 2023-07-11: qty 1

## 2023-07-11 NOTE — ED Provider Notes (Signed)
 I saw and evaluated the patient, reviewed the resident's note and I agree with the findings and plan.  EKG Interpretation Date/Time:  Friday July 11 2023 21:14:03 EST Ventricular Rate:  101 PR Interval:  153 QRS Duration:  95 QT Interval:  318 QTC Calculation: 413 R Axis:   81  Text Interpretation: Sinus tachycardia Abnormal T, consider ischemia, diffuse leads lateral changes new compared to prior Confirmed by Dasie Faden ([id number]) on 07/11/2023 9:19:50 PM   Patient is EKG per interpretation shows sinus tachycardia with new lateral ischemia changes.  Patient presents with left upper extremity weakness which has been going on for several days.  Patiently recently diagnosed with preeclampsia and delivered a child a few days ago.  States that she has symptoms while she was hospitalized.  Called EMS and they noted some slight drift on her left upper extremity however on my exam I do not really appreciate that.  Last seen normal was over 48 hours ago.  Patient denies any back symptoms, headache, emesis, visual changes.  Plan is for head CT and likely MRI with possible neurology consult    Dasie Faden, MD 07/11/23 2121

## 2023-07-11 NOTE — ED Provider Notes (Signed)
 [place] EMERGENCY DEPARTMENT AT [facility] Provider Note  HPI   Shirley [name] is a 33 y.o. female patient with a PMHx of chronic hypertension here today with left arm weakness   33 y.o. yo [id number] at [redacted]w[redacted]d was admitted to the hospital 07/03/2023 for [facility] with superimposed pre-eclampsia. During the course of her admission, patient became short of breath and was noted to have pulmonary edema. Decision was made to proceed with cesarean section   Patient had a prolonged hospital course after the above presentation, was discharged yesterday from the hospital  Patient states that her left arm has been bothering her for a couple of days, with the IV, felt that it got worse last night, and this more communicate this to her family.  Her symptoms are some subjective tingling and weakness.  Today, she called 9 1 because she was concerned if she checked her blood pressure and is high in the 160s 170s so she came in for evaluation.  ROS Negative except as per HPI   Medical Decision Making   Upon presentation, the patient is afebrile, blood pressure is 120/84, with a normal sized blood pressure cuff she is not tachypneic heart rate rate around 105  Sensation is diffusely intact in all 4 extremities, and face, there is no cranial nerve deficit 2 through 12, she may have some faint left upper extremity drift compared to the right upper extremity there is no drift in the lower extremities, her strength in grip strength, and flexion extension at elbow might be slightly reduced on the left compared to the right.  Cerebellar testing with finger-nose intact bilaterally  Glucose check was 102, EMS already also took this, EKG shows a lot of T wave inversions which most of them are new  For this patient, we are going obtain some basic labs CBC CMP troponin magnesium   We immediately involved our neurology colleagues, and asked for recommendations regarding imaging.  We are going to with their  advice, skip the CTs of her head, and obtain brain imaging with an MRI with and without of her brain as well as her cervical spine to look for anything that be causing this neurodeficit.  Will get 1 troponin as well given that she is having some left upper extremity deficit and not endorsing any chest pain or shortness of breath, but did have significant pulmonary edema in the hospital related to her preeclampsia.  She is on room air right now, again she has no chest pain shortness of breath abdominal pain back pain or headaches.  Considered a diagnosis of a venous sinus thrombosis, however with no headache no nausea no vomiting no blurry vision, this makes it much less likely.  The MRI of the brain with and without contrast will catch a large venous clot fortunately.  First troponin 42, will follow-up on second troponin, leukocytosis of 13.5, low calcium  8.7, no other major metabolic derangement,  I have transferred care of this patient to the incoming emergency medicine resident at the end of my shift. I have discussed the patient's HPI, physical exam, and workup and therapeutics with the incoming team. Please see their documentation for the remainder of care. Patient is pending MRI brain and cervical spine   No diagnosis found.  @DISPOSITION @  Rx / DC Orders ED Discharge Orders     None        Past Medical History:  Diagnosis Date   Anxiety and depression 10/05/2019   Arthritis  Assistance needed with transportation 11/22/2019   Chest pain    Chronic low back pain 01/28/2018   Constipation    Depression    Disorder of hip joint 02/16/2018   Edema, lower extremity    Elevated alkaline phosphatase level 10/05/2019   GERD (gastroesophageal reflux disease)    Hypertension    Joint pain    Low HDL (under 40) 10/05/2019   Neck pain 07/16/2019   Obesity    Osteoarthritis    Rheumatoid arthritis (HCC)    Sciatica    Shortness of breath    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   MRI (06/11/2016)  T11-T12: degenerative disc bulge and facet hypertrophy w/o significant stenosis  L4-L5: mild b/l facet arthrosis  L5-S1: mild b/l facet arthrosis, sm central disc protrusion w/o significant stenosis or frank neural impingement and mod b/l foraminal narrowing at L5-S1 related to disc bulge and facet disease, right worse than left     Vitamin D  deficiency 10/05/2019   Past Surgical History:  Procedure Laterality Date   CESAREAN SECTION WITH BILATERAL TUBAL LIGATION N/A 07/07/2023   Procedure: CESAREAN SECTION WITH BILATERAL TUBAL LIGATION;  Surgeon: Barbra Lang PARAS, DO;  Location: [facility] LD ORS;  Service: Obstetrics;  Laterality: N/A;  Bilateral Tubal ligation   TOOTH EXTRACTION N/A 10/22/2013   Procedure: DENTAL EXTRACTIONS TEETH #1, 16, 17, 32;  Surgeon: Glendia CHRISTELLA Primrose, DDS;  Location: [facility] OR;  Service: Oral Surgery;  Laterality: N/A;   TUBAL LIGATION  07/07/2023   Procedure: BILATERAL TUBAL LIGATION;  Surgeon: Barbra Lang PARAS, DO;  Location: [facility] LD ORS;  Service: Obstetrics;;   Family History  Problem Relation Age of Onset   Hypertension Mother    Depression Mother    Anxiety disorder Mother    Obesity Mother    Hypertension Father    Diabetes Maternal Grandmother    Diabetes Paternal Grandmother    Stroke Paternal Grandmother    Social History   Socioeconomic History   Marital status: Single    Spouse name: Not on file   Number of children: 3   Years of education: Not on file   Highest education level: Not on file  Occupational History   Occupation: stay at home mom  Tobacco Use   Smoking status: Former    Current packs/day: 0.00    Types: Cigarettes    Quit date: 01/29/2017    Years since quitting: 6.4   Smokeless tobacco: Never  Substance and Sexual Activity   Alcohol use: No    Alcohol/week: 0.0 standard drinks of alcohol    Comment: rare   Drug use: No    Types: Marijuana    Comment: not since confirmed pregnancy   Sexual  activity: Yes    Birth control/protection: None  Other Topics Concern   Not on file  Social History Narrative   Not on file   Social Drivers of Health   Financial Resource Strain: Not on file  Food Insecurity: No Food Insecurity (07/06/2023)   Hunger Vital Sign    Worried About Running Out of Food in the Last Year: Never true    Ran Out of Food in the Last Year: Never true  Recent Concern: Food Insecurity - Food Insecurity Present (07/03/2023)   Hunger Vital Sign    Worried About Running Out of Food in the Last Year: Never true    Ran Out of Food in the Last Year: Sometimes true  Transportation Needs: No Transportation Needs (07/06/2023)  PRAPARE - Administrator, Civil Service (Medical): No    Lack of Transportation (Non-Medical): No  Recent Concern: Transportation Needs - Unmet Transportation Needs (07/03/2023)   PRAPARE - Administrator, Civil Service (Medical): Yes    Lack of Transportation (Non-Medical): Yes  Physical Activity: Not on file  Stress: Not on file  Social Connections: Not on file  Intimate Partner Violence: Not At Risk (07/03/2023)   Humiliation, Afraid, Rape, and Kick questionnaire    Fear of Current or Ex-Partner: No    Emotionally Abused: No    Physically Abused: No    Sexually Abused: No     Physical Exam   Vitals:   07/11/23 2115 07/11/23 2117  BP: 120/84   Pulse: (!) 105   Resp: 18   Temp: 99.4 F (37.4 C)   TempSrc: Oral   Weight:  (!) 138 kg  Height:  5' 7 (1.702 m)    Physical Exam Vitals and nursing note reviewed.  Constitutional:      General: She is not in acute distress.    Appearance: She is well-developed.  HENT:     Head: Normocephalic and atraumatic.     Mouth/Throat:     Mouth: Mucous membranes are moist.  Eyes:     General:        Right eye: No discharge.        Left eye: No discharge.     Extraocular Movements: Extraocular movements intact.     Conjunctiva/sclera: Conjunctivae normal.     Pupils:  Pupils are equal, round, and reactive to light.  Cardiovascular:     Rate and Rhythm: Normal rate and regular rhythm.     Heart sounds: No murmur heard. Pulmonary:     Effort: Pulmonary effort is normal. No respiratory distress.     Breath sounds: Normal breath sounds.  Abdominal:     Palpations: Abdomen is soft.     Tenderness: There is no abdominal tenderness. There is no right CVA tenderness or left CVA tenderness.     Comments: C-section site looks intact  Musculoskeletal:        General: No swelling.     Cervical back: Neck supple.  Skin:    General: Skin is warm and dry.     Capillary Refill: Capillary refill takes less than 2 seconds.  Neurological:     Mental Status: She is alert and oriented to person, place, and time.     Comments: Sensation is diffusely intact in all 4 extremities, and face, there is no cranial nerve deficit 2 through 12, she may have some faint left upper extremity drift compared to the right upper extremity there is no drift in the lower extremities, her strength in grip strength, and flexion extension at elbow might be slightly reduced on the left compared to the right.  Cerebellar testing with finger-nose intact bilaterally  Psychiatric:        Mood and Affect: Mood normal.      Procedures   If procedures were preformed on this patient, they are listed below:  Procedures  The patient was seen, evaluated, and treated in conjunction with the attending physician, who voiced agreement in the care provided.  Note generated using Dragon voice dictation software and may contain dictation errors. Please contact me for any clarification or with any questions.   Electronically signed by:  Fairy Kerby Revere, M.D. (PGY-2)    Revere Fairy, MD 07/11/23 2317    Dasie Faden,  MD 07/12/23 1536

## 2023-07-12 DIAGNOSIS — M4802 Spinal stenosis, cervical region: Secondary | ICD-10-CM | POA: Diagnosis not present

## 2023-07-12 DIAGNOSIS — M4803 Spinal stenosis, cervicothoracic region: Secondary | ICD-10-CM | POA: Diagnosis not present

## 2023-07-12 DIAGNOSIS — M50222 Other cervical disc displacement at C5-C6 level: Secondary | ICD-10-CM | POA: Diagnosis not present

## 2023-07-12 DIAGNOSIS — M50221 Other cervical disc displacement at C4-C5 level: Secondary | ICD-10-CM | POA: Diagnosis not present

## 2023-07-12 DIAGNOSIS — R29898 Other symptoms and signs involving the musculoskeletal system: Secondary | ICD-10-CM | POA: Diagnosis not present

## 2023-07-12 LAB — TROPONIN I (HIGH SENSITIVITY): Troponin I (High Sensitivity): 45 ng/L — ABNORMAL HIGH (ref <18)

## 2023-07-12 NOTE — ED Provider Notes (Signed)
 12:29 AM Delta troponin flat. Improved compared to prior from 5 days ago.   Transported to MRI. VSS.  2:35 AM BP has been stable and reassuring. MRI brain is normal. MRI C spine does show some mild spinal stenosis at C3/4 with secondary cord flattening; however this is mild and without cord signal changes. Also mild left C8 foraminal stenosis, mild disc bulging at C4/5-C6/7 without stenosis or impingement.  2:56 AM Findings reviewed with patient who verbalizes understanding. Preserved sensation in b/l upper extremities. Strength against resistance in the LUE ~4+/5 compared to 5/5 on the right. Grips remain largely preserved.  3:03 AM Spoke with Dr. Cleatus of OBGYN regarding patient's symptoms. Dr. Cleatus reassured by work up in the ED and does not see indication for further evaluation from an obstetric perspective at this time. Plan for discharge with outpatient neurosurgical referral given stenosis on MRI imaging. Return precautions discussed and provided. Patient discharged in stable condition with no unaddressed concerns.   Keith Sor, PA-C 07/12/23 0305    Haze Lonni PARAS, MD 07/12/23 [phone]

## 2023-07-12 NOTE — Discharge Instructions (Signed)
 We recommend tylenol  or ibuprofen  for pain control, as needed; use as instructed on the box/bottle. Persistent symptoms would benefit from evaluation by a specialist. We have provided a referral to neurosurgery for this reason. Continue postpartum follow up. Return for new or concerning symptoms.

## 2023-07-14 ENCOUNTER — Telehealth (HOSPITAL_COMMUNITY): Payer: Self-pay | Admitting: *Deleted

## 2023-07-14 DIAGNOSIS — Z1331 Encounter for screening for depression: Secondary | ICD-10-CM

## 2023-07-14 NOTE — Telephone Encounter (Signed)
 Hospital inpatient EPDS=15. Patient answered "never" to question #10. CSW consult completed during stay. Ambulatory IBH referral made now, per protocol, and Dr. Adrian Blackwater notified via chart. Paulene Floor, RN, 07/14/23, [phone]

## 2023-07-16 ENCOUNTER — Encounter (HOSPITAL_COMMUNITY): Payer: Self-pay | Admitting: Obstetrics & Gynecology

## 2023-07-16 ENCOUNTER — Inpatient Hospital Stay (HOSPITAL_COMMUNITY)
Admission: AD | Admit: 2023-07-16 | Discharge: 2023-07-16 | Disposition: A | Discharge status: Home / Self Care | Payer: Medicaid Other | Attending: Obstetrics & Gynecology | Admitting: Obstetrics & Gynecology

## 2023-07-16 ENCOUNTER — Other Ambulatory Visit: Payer: Self-pay

## 2023-07-16 ENCOUNTER — Inpatient Hospital Stay (HOSPITAL_COMMUNITY): Payer: Medicaid Other

## 2023-07-16 DIAGNOSIS — L7632 Postprocedural hematoma of skin and subcutaneous tissue following other procedure: Secondary | ICD-10-CM | POA: Diagnosis not present

## 2023-07-16 DIAGNOSIS — R109 Unspecified abdominal pain: Secondary | ICD-10-CM | POA: Diagnosis not present

## 2023-07-16 DIAGNOSIS — R1900 Intra-abdominal and pelvic swelling, mass and lump, unspecified site: Secondary | ICD-10-CM | POA: Diagnosis not present

## 2023-07-16 DIAGNOSIS — S301XXA Contusion of abdominal wall, initial encounter: Secondary | ICD-10-CM | POA: Diagnosis not present

## 2023-07-16 DIAGNOSIS — O902 Hematoma of obstetric wound: Secondary | ICD-10-CM | POA: Insufficient documentation

## 2023-07-16 DIAGNOSIS — T148XXA Other injury of unspecified body region, initial encounter: Secondary | ICD-10-CM

## 2023-07-16 LAB — CBC
HCT: 36.9 % (ref 36.0–46.0)
Hemoglobin: 11.6 g/dL — ABNORMAL LOW (ref 12.0–15.0)
MCH: 26.9 pg (ref 26.0–34.0)
MCHC: 31.4 g/dL (ref 30.0–36.0)
MCV: 85.4 fL (ref 80.0–100.0)
Platelets: 395 10*3/uL (ref 150–400)
RBC: 4.32 MIL/uL (ref 3.87–5.11)
RDW: 15.2 % (ref 11.5–15.5)
WBC: 11.2 10*3/uL — ABNORMAL HIGH (ref 4.0–10.5)
nRBC: 0 % (ref 0.0–0.2)

## 2023-07-16 NOTE — MAU Provider Note (Signed)
 Bruise on R side in s/o PP C/S     S [name] is a 33 y.o. [phone] postpartum female who presents to MAU today with complaint of R sided bruise following her pLTCS on 07/07/23. On chart review was taken emergently due to prolonged deceleration.  She also had a b/l BTL.  Op note state excellent hemostasis throughout entirety of procedure.  Her PP course was complicated by cardiomyopathy in which she responded well to lasix  therapy.  She was d/c'ed on Coreg , Nifedipine  and Lasix .  She was seen at local ED on 1/10 for LUE weakness with concern for LUE drift. Neurology consulted.  Troponins improved, MRI brain normal.  MRI Cspine showed mild spinal stenosis at C3/4 with secondary cord flattening, mild and w/o cord signal changes.  Mild L C8 foraminal stenosis with mild disc buldging at C4/5-C6/7 w/o stensosis or impingement.  Sent with neurosurgical referral given stenosis on MRI imaging.  In triage today she reports new bruise on RLQ with lump underneath.  Noticed for the first time last night.  Denies pain. BP noted to be elevated at 150/108. On repeat normotensive.  Pt states compliance with d/c regimen and states just took her latest medications about prior to presentation, asymptomatic.   Receives care at [facility]. Prenatal records reviewed.  Pertinent items noted in HPI and remainder of comprehensive ROS otherwise negative.   O BP 130/79   Pulse 71   Temp 98 F (36.7 C) (Oral)   Resp 18   Ht 5\' 6"  (1.676 m)   Wt (!) 137.6 kg   LMP 12/26/2022   SpO2 97%   BMI 48.95 kg/m  Physical Exam Vitals and nursing note reviewed.  Constitutional:      General: She is not in acute distress.    Appearance: Normal appearance. She is obese. She is not ill-appearing.  HENT:     Head: Normocephalic and atraumatic.     Right Ear: External ear normal.     Left Ear: External ear normal.     Nose: Nose normal.     Mouth/Throat:     Mouth: Mucous membranes are moist.     Pharynx: Oropharynx  is clear.  Eyes:     Extraocular Movements: Extraocular movements intact.     Conjunctiva/sclera: Conjunctivae normal.  Cardiovascular:     Rate and Rhythm: Normal rate.  Pulmonary:     Effort: Pulmonary effort is normal. No respiratory distress.  Abdominal:     General: There is no distension.     Palpations: Abdomen is soft.     Tenderness: There is abdominal tenderness (TTP over small purple bruise on R flank with subcutanous nodule, well away from incision, Low transverse skin incision healing well w/o seperation / drainage / inudration or surrounding erythemia).  Musculoskeletal:        General: No swelling. Normal range of motion.     Cervical back: Normal range of motion.  Skin:    General: Skin is warm and dry.  Neurological:     Mental Status: She is alert and oriented to person, place, and time. Mental status is at baseline.     Motor: No weakness.     Gait: Gait normal.  Psychiatric:        Mood and Affect: Mood normal.        Behavior: Behavior normal.        Thought Content: Thought content normal.        Judgment: Judgment normal.  MDM: MAU Course:  Will obtain CBC to ensure stable Hgb and limited abd US  to assess for abscess vs hematoma.    CBC 11.6 Hgb, improved from 5 days ago US  prelim read appears to be simple hematoma  Able to tolerate PO prior to d/c. Sending out with recommendations for heat therapy and strict / usual return precautions if area were to enlarge, get more painful or if she developed new / worrisome symptoms.  Pt understanding and agreeable with plan.  Stable for d/c at this time.   A/P: #Hematoma: possibly from [facility] injections while PP as some discoloration surrounding the bruise - heat therapy  Discharge from MAU in stable condition with strict/usual precautions Follow up at [facility] as scheduled for ongoing prenatal care  Allergies as of 07/16/2023   No Known Allergies      Medication List     TAKE these medications    Blood  Pressure Monitoring Devi 1 each by Does not apply route once a week.   carvedilol  25 MG tablet Commonly known as: COREG  Take 1 tablet (25 mg total) by mouth 2 (two) times daily with a meal.   cyclobenzaprine  10 MG tablet Commonly known as: FLEXERIL  Take 1 tablet (10 mg total) by mouth 3 (three) times daily as needed for muscle spasms.   ferrous sulfate  325 (65 FE) MG EC tablet Take 1 tablet (325 mg total) by mouth every other day.   furosemide  20 MG tablet Commonly known as: LASIX  Take 1 tablet (20 mg total) by mouth daily.   ibuprofen  600 MG tablet Commonly known as: ADVIL  Take 1 tablet (600 mg total) by mouth every 6 (six) hours as needed for cramping, headache or fever.   metFORMIN  500 MG tablet Commonly known as: GLUCOPHAGE  Take 1 tablet (500 mg total) by mouth daily in the evening with largest meal.   NIFEdipine  60 MG 24 hr tablet Commonly known as: ADALAT  CC Take 1 tablet (60 mg total) by mouth 2 (two) times daily.   oxyCODONE  5 MG immediate release tablet Commonly known as: Oxy IR/ROXICODONE  Take 1-2 tablets (5-10 mg total) by mouth every 4 (four) hours as needed for moderate pain (pain score 4-6).   PrePLUS 27-1 MG Tabs Take 1 tablet by mouth daily.        Ebony Goldstein, MD 07/16/2023 6:30 PM

## 2023-07-16 NOTE — MAU Note (Signed)
 Shirley [name] is a 33 y.o. at Unknown here in MAU reporting: she has a bruise on her RLQ that has a lump underneath it.  Reports she noticed the bruising for the first time last night.  Reports bruised area doesn't hurt. S/P Cesarean 07/07/2023  LMP: NA Onset of complaint: last night Pain score: 0 Vitals:   07/16/23 1635  BP: (!) 150/108  Pulse: 83  Resp: 18  Temp: 98 F (36.7 C)  SpO2: 97%     FHT:NA Lab orders placed from triage: NA

## 2023-07-16 NOTE — Discharge Instructions (Signed)
 Shirley [name]

## 2023-07-17 ENCOUNTER — Telehealth (HOSPITAL_COMMUNITY): Payer: Self-pay | Admitting: *Deleted

## 2023-07-17 NOTE — Telephone Encounter (Signed)
07/17/2023  Name: Shirley [name] MRN: [id number] DOB: 19-Nov-1990  Reason for Call:  Transition of Care Hospital Discharge Call  Contact Status: Patient Contact Status: Complete  Language assistant needed: Interpreter Mode: Interpreter Not Needed        Follow-Up Questions: Do You Have Any Concerns About Your Health As You Heal From Delivery?: Yes What Concerns Do You Have About Your Health?: Concerned about bruise on her leg.  Was seen in MAU on 07/16/23 for this.  Says that the bruise has not worsened since MAU visit. Do You Have Any Concerns About Your Infants Health?: No  Edinburgh Postnatal Depression Scale:  In the Past 7 Days: I have been able to laugh and see the funny side of things.: Definitely not so much now I have looked forward with enjoyment to things.: Definitely less than I used to I have blamed myself unnecessarily when things went wrong.: Yes, most of the time I have been anxious or worried for no good reason.: Yes, very often I have felt scared or panicky for no good reason.: Yes, sometimes Things have been getting on top of me.: Yes, sometimes I haven't been coping as well as usual I have been so unhappy that I have had difficulty sleeping.: Yes, most of the time I have felt sad or miserable.: Yes, most of the time I have been so unhappy that I have been crying.: Yes, most of the time The thought of harming myself has occurred to me.: Never [name] Postnatal Depression Scale Total: (!) 23  Strongly encouraged patient to keep [facility] appointment on January 27.  Also told her about [employer]partum [employer], both their website and 24 hr hotline.  PHQ2-9 Depression Scale:     Discharge Follow-up: Edinburgh score requires follow up?: Yes Provider notified of Edinburgh score?: Yes Have you already been referred for a counseling appointment?: Yes Date of appointment:: 07/28/23 Time of previous appointment:: 0915 Any barriers for going to appointment?:  no Patient was advised of the following resources:: Support Group  Post-discharge interventions: Reviewed Newborn Safe Sleep Practices Maternal Mental Health Resources provided  Salena Saner, RN 07/17/2023 15:52

## 2023-07-21 ENCOUNTER — Ambulatory Visit (HOSPITAL_COMMUNITY): Payer: Self-pay

## 2023-07-21 NOTE — Lactation Note (Addendum)
This note was copied from a baby's chart.  NICU Lactation Consultation Note  Patient Name: Shirley [name] [id number] Date: 07/21/2023 Age:35 wk.o.  Reason for consult: Follow-up assessment; NICU baby; 1st time breastfeeding; Preterm <34wks; Infant < 6lbs Severe CHTN  SUBJECTIVE  LC in to visit with P4 Mom of preterm baby "Shirley [name]" in the NICU.  Mom in recliner with baby STS on her chest.  Mom is concerned as she is noticing her milk supply is dropping and sometimes when she pumps, she isn't collecting any milk.  Mom shared that her anxiety has been high in the last 2 weeks.  LC empathized and listened.  LC set up DEBP in baby's room per Mom's request.  LC sized the flanges to 21 mm. Mom will wait until baby is not STS to pump as baby is on HHFNC at 4L/min 21% O2.   LC asked permission to hand express, and milk sprayed across the chair.    Mom is not happy with her DEBP at home.  She is concerned that the pump strength is not what the hospital grade pump is (it isn't).  Mom states she signed up with WIC, so LC emailed a referral to [facility] for a [employer].  Mom provided with 3 handouts:  -Galactagogues fpr increasing milk supply -Tips for increasing milk supply in the NICU -Hands on Pumping   Mom mentioned she is taking Lasix for her CHBP.  Talked about Lasix can suppress lactation and lead to low milk supply.  Mom has an appt with her MD in 2 days and will talk about changing to a different medication.  Mom admits to not drinking much.  LC went to family room and brought Mom two cups of water and a can of sparkling water.  Mom encouraged to continue her consistent pumping and work on her hydration.    OBJECTIVE Infant data: Mother's Current Feeding Choice: Breast Milk and Donor Milk  O2 Device: HHFNC O2 Flow Rate (L/min): 4 L/min FiO2 (%): 21 %  Infant feeding assessment No data recorded  Maternal data: [id number] C-Section, Low Transverse Pumping frequency: 6-8  times per 24 hrs Pumped volume: 10 mL Flange Size: 21 Hands-free pumping top sizes: X-Large Chilton Si)  WIC Program: Yes (Mom reports to have WIC now) [facility] Referral Sent?: Yes What county?: [place] Pump: Hands Free, Stork Pump (Spectra S2 and wearable pump at home)  ASSESSMENT Infant:  Feeding Status: Scheduled 8-11-2-5 Feeding method: Tube/Gavage (Bolus)  Maternal: Milk volume: Low  INTERVENTIONS/PLAN Interventions: Interventions: Skin to skin; Breast massage; Hand express; DEBP; Education Tools: Pump; Flanges; Hands-free pumping top Pump Education: Setup, frequency, and cleaning; Milk Storage  Plan: 1- STS with baby as much as possible 2- breast massage and hand expression  3- Pump both breasts on maintain mode 8 times per 24 hrs 4- ask for LC prn  Consult Status: NICU follow-up NICU Follow-up type: Verify onset of copious milk; Weekly NICU follow up   Shirley [name] 07/21/2023, 5:03 PM

## 2023-07-21 NOTE — BH Specialist Note (Signed)
Integrated Behavioral Health via Telemedicine Visit  07/28/2023 CHRISHONDA HESCH [id number]  Number of Integrated Behavioral Health Clinician visits: 1- Initial Visit  Session Start time: 0917   Session End time: 0959  Total time in minutes: 42   Referring Provider: Candelaria Celeste, DO Patient/Family location: Home [facility] Provider location: [facility]  All persons participating in visit: Patient Shirley [name] and [facility] Corrion Stirewalt   Types of Service: Individual psychotherapy and Telephone visit  I connected with [name] and/or Consuelo Pandy Hunsinger's  n/a  via  Telephone or Video Enabled Telemedicine Application  (Video is Caregility application) and verified that I am speaking with the correct person using two identifiers. Discussed confidentiality: Yes   I discussed the limitations of telemedicine and the availability of in person appointments.  Discussed there is a possibility of technology failure and discussed alternative modes of communication if that failure occurs.  I discussed that engaging in this telemedicine visit, they consent to the provision of behavioral healthcare and the services will be billed under their insurance.  Patient and/or legal guardian expressed understanding and consented to Telemedicine visit: Yes   Presenting Concerns: Patient and/or family reports the following symptoms/concerns: Depression, fatigue, poor appetite, insomnia, poor concentration, anxiety, worry, irritability, dread; pt attributes symptoms partially to history of depression and recent increased life stress (financial stress, car problems, lack of support, inability to work due to health issues, adjusting to baby in NICU). Pt is open to referral to psychiatry, has not taken [facility] medication in the past; No SI.  Duration of problem: Increase postpartum; Severity of problem: severe  Patient and/or Family's Strengths/Protective  Factors: Sense of purpose  Goals Addressed: Patient will:  Reduce symptoms of: anxiety, depression, insomnia, and stress   Increase knowledge and/or ability of: healthy habits   Demonstrate ability to: Increase healthy adjustment to current life circumstances, Increase adequate support systems for patient/family, and Increase motivation to adhere to plan of care  Progress towards Goals: Ongoing  Interventions: Interventions utilized:  Solution-Focused Strategies, Psychoeducation and/or Health Education, and Link to [employer] Standardized Assessments completed: GAD-7 and PHQ 9  Patient and/or Family Response: Patient agrees with treatment plan.   Assessment: Patient currently experiencing Major depressive disorder, recurrent, severe, without psychotic features; Generalized anxiety disorder.   Patient may benefit from psychoeducation and brief therapeutic interventions regarding coping with symptoms of depression, anxiety, life stress. .  Plan: Follow up with behavioral health clinician on : Two weeks Behavioral recommendations:  -Begin prioritizing healthy self-care (regular meals, adequate rest; allowing practical help from supportive friends and family) until at least postpartum medical appointment -Consider new mom support group as needed at either www.[email or web address].net or [email or web address]  -Make sure voicemail is set up and not full, today -Accept referrral to psychiatry   Referral(s): Integrated Art gallery manager (In Clinic) and [employer] Resources:  New parent support; SCORE  I discussed the assessment and treatment plan with the patient and/or parent/guardian. They were provided an opportunity to ask questions and all were answered. They agreed with the plan and demonstrated an understanding of the instructions.   They were advised to call back or seek an in-person evaluation if the symptoms worsen or if the condition fails to improve as  anticipated.  Valetta Close Danaysha Kirn, LCSW     07/28/2023    9:40 AM 03/11/2023    5:02 PM 12/20/2022    2:07 PM 06/15/2020    2:05 PM 02/16/2020  12:12 PM  Depression screen PHQ 2/9  Decreased Interest 3 2 2 2 2   Down, Depressed, Hopeless 3 0 2 0 1  PHQ - 2 Score 6 2 4 2 3   Altered sleeping 3 2 1 1 2   Tired, decreased energy 3 2 2 1 2   Change in appetite 3 2 1  0 1  Feeling bad or failure about yourself  3 0 1 0 0  Trouble concentrating 3 0 1 1 1   Moving slowly or fidgety/restless 0 0 0 0 0  Suicidal thoughts 0 0 0 0 0  PHQ-9 Score 21 8 10 5 9   Difficult doing work/chores   Extremely dIfficult Extremely dIfficult Not difficult at all      07/28/2023    9:42 AM 03/11/2023    5:02 PM 10/27/2017   11:03 AM 10/20/2017    2:28 PM  GAD 7 : Generalized Anxiety Score  Nervous, Anxious, on Edge 3 0 0 0  Control/stop worrying 3 1 0 0  Worry too much - different things 3 1 0 0  Trouble relaxing 3 0 0 0  Restless 0 0 0 0  Easily annoyed or irritable 3 1 1  0  Afraid - awful might happen 3 0 0 0  Total GAD 7 Score 18 3 1  0

## 2023-07-23 ENCOUNTER — Encounter (HOSPITAL_COMMUNITY): Payer: Medicaid Other | Admitting: Cardiology

## 2023-07-28 ENCOUNTER — Ambulatory Visit (INDEPENDENT_AMBULATORY_CARE_PROVIDER_SITE_OTHER): Payer: Medicaid Other | Admitting: Clinical

## 2023-07-28 DIAGNOSIS — F411 Generalized anxiety disorder: Secondary | ICD-10-CM

## 2023-07-28 DIAGNOSIS — F332 Major depressive disorder, recurrent severe without psychotic features: Secondary | ICD-10-CM

## 2023-07-28 NOTE — Patient Instructions (Signed)
Center for [facility] at [facility] for [facility] [address] [place], [place] [address] [phone] (main office) [phone] [facility] office)  New Parent Support Groups [email or web address].net [email or web address]  SCORE: For the life of your business www.[email or web address]

## 2023-07-28 NOTE — BH Specialist Note (Signed)
 Integrated Behavioral Health via Telemedicine Visit  08/11/2023 Shirley [name] [id number]  Number of Integrated Behavioral Health Clinician visits: 2- Second Visit  Session Start time: 1048   Session End time: 1104  Total time in minutes: 16   Referring Provider: Werner Hamlet, DO Patient/Family location: Home [facility] Provider location: [facility]  All persons participating in visit: Patient Shirley [name] and [name]   Types of Service: Individual psychotherapy and Video visit  I connected with Shirley [name] and/or Shirley [name]'s  n/a  via  Telephone or Video Enabled Telemedicine Application  (Video is Caregility application) and verified that I am speaking with the correct person using two identifiers. Discussed confidentiality: Yes   I discussed the limitations of telemedicine and the availability of in person appointments.  Discussed there is a possibility of technology failure and discussed alternative modes of communication if that failure occurs.  I discussed that engaging in this telemedicine visit, they consent to the provision of behavioral healthcare and the services will be billed under their insurance.  Patient and/or legal guardian expressed understanding and consented to Telemedicine visit: Yes   Presenting Concerns: Patient and/or family reports the following symptoms/concerns: Worry about finances/paying utilities and worry about baby still in NICU.  Duration of problem: Increased postpartum; Severity of problem: severe  Patient and/or Family's Strengths/Protective Factors: Concrete supports in place (healthy food, safe environments, etc.) and Sense of purpose  Goals Addressed: Patient will:  Reduce symptoms of: anxiety, depression, insomnia, and stress    Demonstrate ability to: Increase healthy adjustment to current life circumstances, Increase adequate support systems for  patient/family, and Increase motivation to adhere to plan of care  Progress towards Goals: Ongoing  Interventions: Interventions utilized:  Motivational Interviewing, Link to [employer], and Supportive Reflection Standardized Assessments completed: GAD-7 and PHQ 9  Patient and/or Family Response: Patient agrees with treatment plan.   Assessment: Patient currently experiencing Major depressive disorder, severe, recurrent, without psychotic features; Generalized anxiety disorder.   Patient may benefit from continued therapeutic intervention  .  Plan: Follow up with behavioral health clinician on : Two weeks Behavioral recommendations:  -Continue prioritizing healthy self-care (regular meals, adequate rest; allowing practical help from supportive friends and family)  -Consider new mom support group as needed at either [email or web address].net or [email or web address]  -Continue to accept referral to psychiatry -Read through information on After Visit Summary; use as needed and discussed  Referral(s): Integrated Art gallery manager (In Clinic), Community Mental Health Services (LME/Outside Clinic), and Community Resources:  Utilities; transportation  I discussed the assessment and treatment plan with the patient and/or parent/guardian. They were provided an opportunity to ask questions and all were answered. They agreed with the plan and demonstrated an understanding of the instructions.   They were advised to call back or seek an in-person evaluation if the symptoms worsen or if the condition fails to improve as anticipated.  Shirley Kipper, LCSW     08/11/2023   10:54 AM 07/28/2023    9:40 AM 03/11/2023    5:02 PM 12/20/2022    2:07 PM 06/15/2020    2:05 PM  Depression screen PHQ 2/9  Decreased Interest 2 3 2 2 2   Down, Depressed, Hopeless 2 3 0 2 0  PHQ - 2 Score 4 6 2 4 2   Altered sleeping 2 3 2 1 1   Tired, decreased energy 2 3 2 2 1   Change in appetite 2 3 2  1 0  Feeling bad or failure about yourself  2 3 0 1 0  Trouble concentrating 0 3 0 1 1  Moving slowly or fidgety/restless 0 0 0 0 0  Suicidal thoughts 0 0 0 0 0  PHQ-9 Score 12 21 8 10 5   Difficult doing work/chores    Extremely dIfficult Extremely dIfficult      08/11/2023   10:56 AM 07/28/2023    9:42 AM 03/11/2023    5:02 PM 10/27/2017   11:03 AM  GAD 7 : Generalized Anxiety Score  Nervous, Anxious, on Edge 2 3 0 0  Control/stop worrying 2 3 1  0  Worry too much - different things 3 3 1  0  Trouble relaxing 2 3 0 0  Restless 1 0 0 0  Easily annoyed or irritable 2 3 1 1   Afraid - awful might happen 1 3 0 0  Total GAD 7 Score 13 18 3  1

## 2023-07-30 ENCOUNTER — Inpatient Hospital Stay: Payer: Medicaid Other | Admitting: Student

## 2023-07-31 ENCOUNTER — Ambulatory Visit (HOSPITAL_COMMUNITY): Payer: Self-pay

## 2023-07-31 NOTE — Lactation Note (Signed)
This note was copied from a baby's chart.  NICU Lactation Consultation Note  Patient Name: Shirley [name] [id number] Date: 07/31/2023 Age:35 wk.o.  Reason for consult: Weekly NICU follow-up; 1st time breastfeeding; NICU baby; Preterm <34wks; Other (Comment); Infant < 6lbs (severe cHTN, BMI 50, former smoker)  SUBJECTIVE Visited with family of 70 62/70 weeks old AGA NICU female; Ms.  [name] reported that her supply continues to dwindle; she hasn't had the chance to speak with her provider regarding switching to a different medicine. She has not heard back from the [facility] office, she was looking to get a hospital grade pump from them. Asked her to call her provider and the [facility] office to get updates regarding her medication and the pump. She inquired about starting Phentermine and L4 an appetite suppressant for weight loss, let her know that due to no data on that drug it is possibly hazardous according to medications and mother's milk by Shirley [name]. Revised strategies to increase supply and let her know that we will support her feeding choice on whatever she decides.  OBJECTIVE Infant data: Mother's Current Feeding Choice: Breast Milk and Donor Milk  O2 Device: HHFNC O2 Flow Rate (L/min): 2 L/min FiO2 (%): 21 %  Maternal data: [id number] C-Section, Low Transverse Pumping frequency: 5-6 times/24 hours Pumped volume: 5 mL (5-10 ml)  WIC Program: Yes (Mom reports to have WIC now) [facility] Referral Sent?: Yes What county?: [place] Pump: Hands Free, Stork Pump (Spectra S2 and wearable pump at home)  ASSESSMENT Infant: Feeding Status: Scheduled 8-11-2-5 Feeding method: Tube/Gavage (Bolus)  Maternal: Milk volume: Low  INTERVENTIONS/PLAN Interventions: Interventions: Breast feeding basics reviewed; Education; DEBP  Plan: Encouraged pumping on maintain mode every 3 hours, ideally 8 pumping sessions/24 hours STS whenever possible, pump right after She'll try power pumping in the AM   No other  support person at this time. All questions and concerns answered, family to contact [facility] services PRN.  Consult Status: NICU follow-up NICU Follow-up type: Weekly NICU follow up   Shirley [name] Shirley [name] 07/31/2023, 5:37 PM

## 2023-08-09 ENCOUNTER — Ambulatory Visit (HOSPITAL_COMMUNITY): Payer: Self-pay

## 2023-08-09 NOTE — Lactation Note (Addendum)
 This note was copied from a baby's chart.  NICU Lactation Consultation Note  Patient Name: Boy Jaicee Michelotti Unijb'd Date: 08/09/2023 Age:35 wk.o.  Reason for consult: Follow-up assessment; NICU baby; Preterm <34wks; Infant < 6lbs; Weekly NICU follow-up; Other (Comment) (Chronic HBP)  SUBJECTIVE  LC in to visit with P4 Mom of [name] in the NICU.   Mom is still struggling with her milk supply expressing 10 ml at each pumping.   Mom continues on medication for her BP.  Mom to bring in her list of Rx and LC offered to look all the medications up in [facility] Mother's Milk resource.  Mom still enjoys pumping so she will continue to express her small amounts to benefit her baby.    Encouraged STS with baby during feeding times.  Addendum- Mom expressed 20 ml using the Symphony pump.  Mom has WIC, LC to email [facility] coordinator as Mom has an appt later this month.  LC also offered Mom a [facility] loaner pump.  Mom will let LC know tomorrow.  OBJECTIVE Infant data: No data recorded O2 Device: Room Air  Infant feeding assessment IDFTS - Readiness: 5   Maternal data: [id number] C-Section, Low Transverse Pumping frequency: 6-8 times per 24 hrs Pumped volume: 10 mL Flange Size: 21 Hands-free pumping top sizes: Brena [name])  WIC Program: Yes (Mom reports to have WIC now) [facility] Referral Sent?: Yes What county?: [place] Pump: Hands Free, Stork Pump (Spectra  S2 and wearable pump at home)  ASSESSMENT Infant:  Feeding Status: Continuous gastric feedings Feeding method: Continuous Gastric  Maternal: Milk volume: Low  INTERVENTIONS/PLAN Interventions: Interventions: Breast feeding basics reviewed; Skin to skin; Breast massage; Hand express; DEBP; Education Tools: Pump; Flanges Pump Education: Setup, frequency, and cleaning; Milk Storage  Plan: Consult Status: NICU follow-up NICU Follow-up type: Weekly NICU follow up   Claudene Aleck BRAVO 08/09/2023, 3:26 PM

## 2023-08-11 ENCOUNTER — Ambulatory Visit (INDEPENDENT_AMBULATORY_CARE_PROVIDER_SITE_OTHER): Payer: Medicaid Other | Admitting: Clinical

## 2023-08-11 ENCOUNTER — Telehealth (INDEPENDENT_AMBULATORY_CARE_PROVIDER_SITE_OTHER): Payer: Medicaid Other | Admitting: Obstetrics and Gynecology

## 2023-08-11 ENCOUNTER — Encounter: Payer: Self-pay | Admitting: Obstetrics and Gynecology

## 2023-08-11 ENCOUNTER — Ambulatory Visit (HOSPITAL_COMMUNITY): Payer: Self-pay

## 2023-08-11 DIAGNOSIS — O99412 Diseases of the circulatory system complicating pregnancy, second trimester: Secondary | ICD-10-CM

## 2023-08-11 DIAGNOSIS — Z6841 Body Mass Index (BMI) 40.0 and over, adult: Secondary | ICD-10-CM

## 2023-08-11 DIAGNOSIS — F411 Generalized anxiety disorder: Secondary | ICD-10-CM

## 2023-08-11 DIAGNOSIS — M5020 Other cervical disc displacement, unspecified cervical region: Secondary | ICD-10-CM | POA: Insufficient documentation

## 2023-08-11 DIAGNOSIS — F332 Major depressive disorder, recurrent severe without psychotic features: Secondary | ICD-10-CM

## 2023-08-11 DIAGNOSIS — O10919 Unspecified pre-existing hypertension complicating pregnancy, unspecified trimester: Secondary | ICD-10-CM

## 2023-08-11 DIAGNOSIS — Z91199 Patient's noncompliance with other medical treatment and regimen due to unspecified reason: Secondary | ICD-10-CM

## 2023-08-11 DIAGNOSIS — F53 Postpartum depression: Secondary | ICD-10-CM

## 2023-08-11 MED ORDER — NIFEDIPINE ER 60 MG PO TB24: 60.0000 mg | ORAL_TABLET | Freq: Two times a day (BID) | ORAL | 0 refills | Status: DC

## 2023-08-11 MED ORDER — CARVEDILOL 25 MG PO TABS: 25.0000 mg | ORAL_TABLET | Freq: Two times a day (BID) | ORAL | 1 refills | Status: DC

## 2023-08-11 NOTE — Progress Notes (Signed)
Provider location: Center for [employer] [facility] [employer] [facility]   Patient location: Home  I connected with Shirley [name]. Bushong on 08/11/23 at  1:55 PM EST by Mychart Video Encounter and verified that I am speaking with the correct person using two identifiers.       I discussed the limitations, risks, security and privacy concerns of performing an evaluation and management service virtually and the availability of in person appointments. I also discussed with the patient that there may be a patient responsible charge related to this service. The patient expressed understanding and agreed to proceed.  Post Partum Visit Note Subjective:   Shirley [name] is a 33 y.o. [id number] s/p 07/07/2023 primary c-section and bilateral salpingectomy at 27 weeks after failed IOL due to worsening severe pre-eclampsia. Patient admitted on 1/2 for [facility] and superimposed severe pre-eclampsia based on BPs. Her PMHx is significant for CHTN, BMI 50s, h/o pre-eclampsia, insufficient prenatal care, PP depression. She was followed by cardiology during her hospitalization and discharged to home on 1/9  Anesthesia: spinal. Baby is doing well in the NICU. Baby is feeding by breast. Bleeding no bleeding. Bowel function is normal. Bladder function is normal. Patient is not sexually active. Contraception method is tubal ligation. Postpartum depression screening: positive.     Edinburgh Postnatal Depression Scale - 08/11/23 1401       Edinburgh Postnatal Depression Scale:  In the Past 7 Days   I have been able to laugh and see the funny side of things. 2    I have looked forward with enjoyment to things. 3    I have blamed myself unnecessarily when things went wrong. 3    I have been anxious or worried for no good reason. 3    I have felt scared or panicky for no good reason. 3    Things have been getting on top of me. 2    I have been so unhappy that I have had difficulty sleeping. 3    I have felt sad or  miserable. 3    I have been so unhappy that I have been crying. 3    The thought of harming myself has occurred to me. 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 Shira had no medications administered during this visit. Current Outpatient Medications  Medication Sig Dispense Refill   Blood Pressure Monitoring DEVI 1 each by Does not apply route once a week. 1 each 0   ibuprofen (ADVIL) 600 MG tablet Take 1 tablet (600 mg total) by mouth every 6 (six) hours as needed for cramping, headache or fever. 30 tablet 0   oxyCODONE (OXY IR/ROXICODONE) 5 MG immediate release tablet  Take 1-2 tablets (5-10 mg total) by mouth every 4 (four) hours as needed for moderate pain (pain score 4-6). 30 tablet 0   carvedilol (COREG) 25 MG tablet Take 1 tablet (25 mg total) by mouth 2 (two) times daily with a meal. (Patient not taking: Reported on 08/12/2023) 30 tablet 1   furosemide (LASIX) 20 MG tablet Take 1 tablet (20 mg total) by mouth daily. (Patient not taking: Reported on 08/12/2023) 30 tablet 0   NIFEdipine (ADALAT CC) 60 MG 24 hr tablet Take 1 tablet (60 mg total) by mouth 2 (two) times daily.  (Patient not taking: Reported on 08/12/2023) 30 tablet 0   No current facility-administered medications for this visit.    Allergies: has no known allergies.  Review of Systems Pertinent items noted in HPI and remainder of comprehensive ROS otherwise negative.  Objective:  BP (!) 155/102   LMP 12/26/2022   Breastfeeding Yes     General:  Alert, oriented and cooperative. Patient is in no acute distress.  Respiratory: Normal respiratory effort, no problems with respiration noted  Mental Status: Normal mood and affect. Normal behavior. Normal judgment and thought content.  Rest of physical exam deferred due to type of encounter   Assessment:   Patient stable  Plan:   1. Postpartum care and examination (Primary) Pap neg 03/2023. Patient showed me her incision and it looks well healed  2. Chronic hypertension affecting pregnancy Patient no showed to her 1/22 cardiology appt and her 1/29 PCP appointment. Importance of keeping her upcoming cardiology appt d/w her and I also told her to call her PCP to make another appointment.   She states she finished her one month of lasix and also states that is not taking her coreg (states she ran out a week ago) because she ran out and may not be taking her procardia twice a day as appropriate. Refill for the next few days given to her for the coreg and procardia and to take both as instructed  3. Poor compliance  4. Compressed cervical disc Diagnosed in the ED based on MRI findings after UE weakeness CC. Patient states that she has a h/o of this and may have seen someone for this in the past. Referral placed - Ambulatory referral to Neurosurgery  5. BMI 50.0-59.9, adult (HCC) PCP follow up  6. Postpartum depression Seen by [facility] today and referral to psych in process  7. 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    I provided 20 minutes of face-to-face time during this encounter.  Future  Appointments  Date Time Provider Department Center  08/20/2023 10:55 AM Louanne Skye, Devoria Albe., NP [facility]  08/25/2023  3:15 PM [facility]    Mendota Bing, MD Center for [facility], [facility]

## 2023-08-11 NOTE — Patient Instructions (Signed)
 Center for [facility] at [facility] for [facility] [address] [place], [place] [address] [phone] (main office) [phone] (Jalilah Wiltsie's office)  [facility] [employer] for Housing and [employer] Studies: Emergency Rental Assistance Resources to residents of [place], [place], and [facility] Make sure you have your documents ready, including:  (Household income verification: 2 months pay stubs, unemployment/social security award letter, statement of no income for all household members over 43) Photo ID for all household members over 18 Utility Bill/Rent Ledger/Lease: must show past due amount for utilities/rent, or the rental agreement if rent is current 2. Start your application online or by paper (in [place] or [place]) at:     [email or web address]  3. Once you have completed the online application, you will get an email confirmation message from the county. Expect to hear back by phone or email at least 6-10 weeks from submitting your application.  4. While you wait:  Call [phone] to check in on your application Let your landlord know that you've applied. Your landlord will be asked to submit documents (W-9) during this application process. Payments will be made directly to the landlord/property management company and utility company Rent or utility assistance for [employer], [place], and [place] Apply at [email or web address] Questions? Call or email Renee at [phone] or [email or web address]   [facility] [address] [place], [place] [address] [phone] [email or web address]  **emergency and transitional housing, rent/mortgage assistance, utility assistance

## 2023-08-11 NOTE — Lactation Note (Signed)
 This note was copied from a baby's chart. Lactation Consultation Note  Patient Name: Shirley [name] [id number] Date: 08/11/2023 Age:35 wk.o.   LC in the room to visit with family but Ms. Butto has not come in for the day yet. WIC director Rosette Console emailed the NICU lactation team regarding the [facility] mobile unit happening tomorrow 08/12/2023 at the [place] in [address] in [place]. Spoke to Ms. Hast over the phone and let her know about the mobile clinic, she said she'll try to attend to get her hospital grade pump. No further questions or concerns; family aware of LC services and will contact PRN.  Shynice Sigel Newman Bare 08/11/2023, 4:23 PM

## 2023-08-12 ENCOUNTER — Encounter: Payer: Self-pay | Admitting: Obstetrics and Gynecology

## 2023-08-12 DIAGNOSIS — F53 Postpartum depression: Secondary | ICD-10-CM | POA: Insufficient documentation

## 2023-08-12 NOTE — BH Specialist Note (Signed)
 Integrated Behavioral Health via Telemedicine Visit  08/25/2023 Shirley [name] [id number]  Number of Integrated Behavioral Health Clinician visits: 3- Third Visit  Session Start time: 1517   Session End time: 1540  Total time in minutes: 23 (3:17-3:22; phone needed charge, then arrived again 3:37-3:55, 23 total minutes)  Referring Provider: Candelaria Celeste, DO Patient/Family location: [facility] Provider location: [facility]  All persons participating in visit: Patient Shirley [name] and [facility] Jordon Kristiansen   Types of Service: Individual psychotherapy and Video visit  I connected with [name] and/or Consuelo Pandy Vereen's  n/a  via  Telephone or Video Enabled Telemedicine Application  (Video is Caregility application) and verified that I am speaking with the correct person using two identifiers. Discussed confidentiality: Yes   I discussed the limitations of telemedicine and the availability of in person appointments.  Discussed there is a possibility of technology failure and discussed alternative modes of communication if that failure occurs.  I discussed that engaging in this telemedicine visit, they consent to the provision of behavioral healthcare and the services will be billed under their insurance.  Patient and/or legal guardian expressed understanding and consented to Telemedicine visit: Yes   Presenting Concerns: Patient and/or family reports the following symptoms/concerns: Tearful, with poor sleep quality the past week, while processing hearing that son has necrotizing enterocolitis, found at about five weeks old, transferred to [facility]; followed by a collapsed lung yesterday. Pt is coping by waiting to see if antibiotics will help before deciding upon surgery; leaving it in the hands of God; good support from family, to help care for other children. Duration of problem: Son's most recent  health changes in the past week; Severity of problem: severe  Patient and/or Family's Strengths/Protective Factors: Social connections, Concrete supports in place (healthy food, safe environments, etc.), and Sense of purpose  Goals Addressed: Patient will:  Reduce symptoms of: anxiety, depression, insomnia, and stress    Demonstrate ability to: Increase healthy adjustment to current life circumstances  Progress towards Goals: Ongoing  Interventions: Interventions utilized:  Functional Assessment of ADLs and Supportive Reflection Standardized Assessments completed: Not Needed  Patient and/or Family Response: Patient agrees with treatment plan.   Assessment: Patient currently experiencing Major depressive disorder, severe, recurrent, without psychotic features; Generalized anxiety disorder.   Patient may benefit from continued therapeutic intervention  .  Plan: Follow up with behavioral health clinician on : Call Twisha Vanpelt at [phone], as needed; Asher Muir will call for phone follow up in one week Behavioral recommendations:  -Continue to gather all supportive people around you during this time; accept offers of support from family and friends -Continue plan to see son today at [facility]; spend as much time as able with him -Continue getting rest as able throughout each day this week -Continue plan to attend upcoming scheduled appointments with [facility] (10/08/23; 10/30/23), as well as Neurosurgery consult (09/01/23)and Cardiology(10/07/23) Referral(s): Integrated [employer] (In Clinic)  I discussed the assessment and treatment plan with the patient and/or parent/guardian. They were provided an opportunity to ask questions and all were answered. They agreed with the plan and demonstrated an understanding of the instructions.   They were advised to call back or seek an in-person evaluation if the symptoms worsen or if the  condition fails to improve as anticipated.  Rae Lips, LCSW     08/11/2023   10:54 AM 07/28/2023  9:40 AM 03/11/2023    5:02 PM 12/20/2022    2:07 PM 06/15/2020    2:05 PM  Depression screen PHQ 2/9  Decreased Interest 2 3 2 2 2   Down, Depressed, Hopeless 2 3 0 2 0  PHQ - 2 Score 4 6 2 4 2   Altered sleeping 2 3 2 1 1   Tired, decreased energy 2 3 2 2 1   Change in appetite 2 3 2 1  0  Feeling bad or failure about yourself  2 3 0 1 0  Trouble concentrating 0 3 0 1 1  Moving slowly or fidgety/restless 0 0 0 0 0  Suicidal thoughts 0 0 0 0 0  PHQ-9 Score 12 21 8 10 5   Difficult doing work/chores    Extremely dIfficult Extremely dIfficult      08/11/2023   10:56 AM 07/28/2023    9:42 AM 03/11/2023    5:02 PM 10/27/2017   11:03 AM  GAD 7 : Generalized Anxiety Score  Nervous, Anxious, on Edge 2 3 0 0  Control/stop worrying 2 3 1  0  Worry too much - different things 3 3 1  0  Trouble relaxing 2 3 0 0  Restless 1 0 0 0  Easily annoyed or irritable 2 3 1 1   Afraid - awful might happen 1 3 0 0  Total GAD 7 Score 13 18 3 1    '

## 2023-08-18 ENCOUNTER — Ambulatory Visit (HOSPITAL_COMMUNITY): Payer: Self-pay

## 2023-08-18 NOTE — Progress Notes (Deleted)
 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   Depression    Disorder of hip joint 02/16/2018   Edema, lower extremity    Elevated alkaline phosphatase level 10/05/2019   GERD (gastroesophageal reflux disease)    Hypertension    Hypertensive emergency 07/03/2023   Joint pain    Low HDL (under 40) 10/05/2019   Neck pain 07/16/2019   Obesity    Osteoarthritis    Pre-eclampsia, antepartum 07/09/2023   Rheumatoid arthritis (HCC)    Sciatica    Shortness of breath    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   MRI (06/11/2016)  T11-T12: degenerative disc bulge and facet hypertrophy w/o significant stenosis  L4-L5: mild b/l facet arthrosis  L5-S1: mild b/l facet arthrosis, sm central disc protrusion w/o significant stenosis or frank neural impingement and mod b/l foraminal narrowing at L5-S1 related to disc bulge and facet disease, right worse than left     Vitamin D deficiency 10/05/2019    History of Present Illness  Shirley [name] is a 33 y.o. female with a PMH of maternal cardiomyopathy, hypertensive urgency related to preeclampsia, nonrheumatic MVR  [name] is a 33 year old female presented to the ED on 07/03/2023 with uncontrolled HTN in in the setting of preeclampsia with pulmonary edema.  She reported being off of most of her medications over the past year due to cost.  She received multiple p.o. and IV medications with improvement however developed  acute shortness of breath with requirement of 3 L and underwent 2D echo that showed EF of 50-55% with low normal LV function and no RWMA with mild to moderate MR.  Troponins were elevated at 152 and BNP was 538.  She was also found to have elevated creatinine of 2.88 and chest x-ray showed bibasilar infiltrates.  She was diuresed 5 L and the decision was made due to preeclampsia and distress to the baby to deliver.  She underwent a C-section with salpingectomy.  Her estimated due date was April 2025 and patient had an uncomplicated postop course.  She experienced bradycardia with labetalol and this was discontinued and carvedilol 20 mg was started with improvement to blood pressure along with nifedipine.  She was discharged in stable condition on 07/10/2023.  During today's visit the patient reports*** .  Patient denies chest pain, palpitations, dyspnea, PND, orthopnea, nausea, vomiting, dizziness, syncope, edema, weight gain, or early satiety.  ***Notes: Patient will need sleep study evaluation and renal artery duplex to rule out renal stenosis -Last ischemic evaluation: -Last echo: -Interim ED visits: Review of Systems  Please see the history of present illness.    All other systems reviewed and are otherwise negative except as noted above.  Physical Exam    Wt Readings from Last 3 Encounters:  07/16/23 (!) 303 lb 4.8 oz (137.6 kg)  07/11/23 (!) 304 lb 3.8 oz (138 kg)  07/09/23 (!) 304 lb 3.8 oz (138 kg)   OZ:DGUYQ were no vitals filed for  this visit.,There is no height or weight on file to calculate BMI. GEN: Well nourished, well developed in no acute distress Neck: No JVD; No carotid bruits Pulmonary: Clear to auscultation without rales, wheezing or rhonchi  Cardiovascular: Normal rate. Regular rhythm. Normal S1. Normal S2.   Murmurs: There is no murmur.  ABDOMEN: Soft, non-tender, non-distended EXTREMITIES:  No edema; No deformity   EKG/LABS/ Recent Cardiac Studies   ECG personally  reviewed by me today - ***  Risk Assessment/Calculations:   {Does this patient have ATRIAL FIBRILLATION?[phone]}      Lab Results  Component Value Date   WBC 11.2 (H) 07/16/2023   HGB 11.6 (L) 07/16/2023   HCT 36.9 07/16/2023   MCV 85.4 07/16/2023   PLT 395 07/16/2023   Lab Results  Component Value Date   CREATININE 0.96 07/11/2023   BUN 14 07/11/2023   NA 136 07/11/2023   K 4.7 07/11/2023   CL 101 07/11/2023   CO2 23 07/11/2023   Lab Results  Component Value Date   CHOL 119 02/13/2023   HDL 43 (L) 02/13/2023   LDLCALC 63 02/13/2023   TRIG 47 02/13/2023   CHOLHDL 2.8 02/13/2023    Lab Results  Component Value Date   HGBA1C 5.9 (H) 03/12/2023   Assessment & Plan    1.  Primary hypertension:  2.  Nonrheumatic mitral regurgitation:  3.  Maternal cardiomyopathy  4.  Iron deficiency anemia      Disposition: Follow-up with None or APP in *** months {Are you ordering a CV Procedure (e.g. stress test, cath, DCCV, TEE, etc)?   Press F2        :[id number]}   Signed, Napoleon Form, Leodis Rains, NP 08/18/2023, 1:07 PM [facility]

## 2023-08-18 NOTE — Lactation Note (Signed)
This note was copied from a baby's chart.  NICU Lactation Consultation Note  Patient Name: Shirley [name] Date: 08/18/2023 Age:35 wk.o.  Reason for consult: Weekly NICU follow-up; NICU baby; Infant < 6lbs; Preterm <34wks; Other (Comment) (severe cHTN, BMI 50, former smoker)  SUBJECTIVE Visited with family of 31 17/62 weeks old AGA NICU female; Ms. Paulus continues pumping for baby "Shirley [name]" but voiced that despite pumping consistently and power pumping her supply has not increased. She's still getting more volumes with the Symphony breast pump in baby's room Vs her pump at home. She voiced that she missed the [facility] mobile clinic on 08/12/23 and hasn't had a chance to stop by the [facility] office; but has an appt this week to P/U her pump. Reviewed additional strategies to increase supply such as STS care and taking baby to breast for some "lick & learn".   OBJECTIVE Infant data: Mother's Current Feeding Choice: Breast Milk and Donor Milk  O2 Device: Room Air  Infant feeding assessment IDFTS - Readiness: 3   Maternal data: [id number] C-Section, Low Transverse Pumping frequency: 6-7 times/24 hours Pumped volume: 5 mL (5-10 ml (when using her pump at home); 20 ml when using the Symphony)  WIC Program: Yes (Mom reports to have WIC now) [facility] Referral Sent?: Yes What county?: [place] Pump: Hands Free, Stork Pump (Spectra S2 and wearable pump at home)  ASSESSMENT Infant: Feeding Status: Scheduled 9-12-3-6 Feeding method: Tube/Gavage (Bolus)  Maternal: Milk volume: Low  INTERVENTIONS/PLAN Interventions: Interventions: Breast feeding basics reviewed; DEBP; Education  Plan: Encouraged pumping on maintain mode every 3 hours, ideally 8 pumping sessions/24 hours STS whenever possible, pump right after She'll continue power pumping in the AM or whenever it fits her schedule She'll pick up her Symphony pump @ the [facility] office on 08/23/2023 She'll call for assistance when baby Shirley [name] is ready to  go to breast for IDF 1   No other support person at this time. All questions and concerns answered, family to contact [facility] services PRN.   Consult Status: NICU follow-up NICU Follow-up type: Weekly NICU follow up   Deaire Mcwhirter [name] 08/18/2023, 10:53 AM

## 2023-08-20 ENCOUNTER — Ambulatory Visit: Payer: Medicaid Other | Admitting: Nurse Practitioner

## 2023-08-20 DIAGNOSIS — D508 Other iron deficiency anemias: Secondary | ICD-10-CM

## 2023-08-20 DIAGNOSIS — I340 Nonrheumatic mitral (valve) insufficiency: Secondary | ICD-10-CM

## 2023-08-20 DIAGNOSIS — I10 Essential (primary) hypertension: Secondary | ICD-10-CM

## 2023-08-20 DIAGNOSIS — I429 Cardiomyopathy, unspecified: Secondary | ICD-10-CM

## 2023-08-25 ENCOUNTER — Ambulatory Visit: Payer: Medicaid Other | Admitting: Clinical

## 2023-08-25 DIAGNOSIS — F332 Major depressive disorder, recurrent severe without psychotic features: Secondary | ICD-10-CM

## 2023-08-25 DIAGNOSIS — F411 Generalized anxiety disorder: Secondary | ICD-10-CM

## 2023-08-26 DIAGNOSIS — M5126 Other intervertebral disc displacement, lumbar region: Secondary | ICD-10-CM | POA: Diagnosis not present

## 2023-08-26 DIAGNOSIS — R202 Paresthesia of skin: Secondary | ICD-10-CM | POA: Diagnosis not present

## 2023-08-26 DIAGNOSIS — M419 Scoliosis, unspecified: Secondary | ICD-10-CM | POA: Diagnosis not present

## 2023-08-26 DIAGNOSIS — G894 Chronic pain syndrome: Secondary | ICD-10-CM | POA: Diagnosis not present

## 2023-08-27 ENCOUNTER — Encounter (HOSPITAL_COMMUNITY): Payer: Self-pay | Admitting: *Deleted

## 2023-08-27 ENCOUNTER — Emergency Department (HOSPITAL_COMMUNITY): Payer: Medicaid Other

## 2023-08-27 ENCOUNTER — Other Ambulatory Visit: Payer: Self-pay

## 2023-08-27 ENCOUNTER — Telehealth: Payer: Self-pay | Admitting: Clinical

## 2023-08-27 ENCOUNTER — Emergency Department (HOSPITAL_COMMUNITY)
Admission: EM | Admit: 2023-08-27 | Discharge: 2023-08-28 | Disposition: A | Discharge status: Home / Self Care | Payer: Medicaid Other | Attending: Emergency Medicine | Admitting: Emergency Medicine

## 2023-08-27 DIAGNOSIS — Z23 Encounter for immunization: Secondary | ICD-10-CM | POA: Insufficient documentation

## 2023-08-27 DIAGNOSIS — S92315A Nondisplaced fracture of first metatarsal bone, left foot, initial encounter for closed fracture: Secondary | ICD-10-CM

## 2023-08-27 DIAGNOSIS — S80211A Abrasion, right knee, initial encounter: Secondary | ICD-10-CM | POA: Insufficient documentation

## 2023-08-27 DIAGNOSIS — S8255XA Nondisplaced fracture of medial malleolus of left tibia, initial encounter for closed fracture: Secondary | ICD-10-CM | POA: Diagnosis not present

## 2023-08-27 DIAGNOSIS — S3993XA Unspecified injury of pelvis, initial encounter: Secondary | ICD-10-CM | POA: Diagnosis not present

## 2023-08-27 DIAGNOSIS — S30811A Abrasion of abdominal wall, initial encounter: Secondary | ICD-10-CM | POA: Diagnosis not present

## 2023-08-27 DIAGNOSIS — S30810A Abrasion of lower back and pelvis, initial encounter: Secondary | ICD-10-CM | POA: Diagnosis not present

## 2023-08-27 DIAGNOSIS — R0789 Other chest pain: Secondary | ICD-10-CM | POA: Diagnosis not present

## 2023-08-27 DIAGNOSIS — R000 Tachycardia, unspecified: Secondary | ICD-10-CM | POA: Diagnosis not present

## 2023-08-27 DIAGNOSIS — S92312D Displaced fracture of first metatarsal bone, left foot, subsequent encounter for fracture with routine healing: Secondary | ICD-10-CM | POA: Diagnosis present

## 2023-08-27 DIAGNOSIS — S0081XA Abrasion of other part of head, initial encounter: Secondary | ICD-10-CM | POA: Insufficient documentation

## 2023-08-27 DIAGNOSIS — S20319A Abrasion of unspecified front wall of thorax, initial encounter: Secondary | ICD-10-CM | POA: Insufficient documentation

## 2023-08-27 DIAGNOSIS — M1711 Unilateral primary osteoarthritis, right knee: Secondary | ICD-10-CM | POA: Diagnosis not present

## 2023-08-27 DIAGNOSIS — I10 Essential (primary) hypertension: Secondary | ICD-10-CM | POA: Diagnosis not present

## 2023-08-27 DIAGNOSIS — T07XXXA Unspecified multiple injuries, initial encounter: Secondary | ICD-10-CM | POA: Diagnosis not present

## 2023-08-27 DIAGNOSIS — S80812A Abrasion, left lower leg, initial encounter: Secondary | ICD-10-CM | POA: Diagnosis not present

## 2023-08-27 DIAGNOSIS — S0990XA Unspecified injury of head, initial encounter: Secondary | ICD-10-CM | POA: Diagnosis not present

## 2023-08-27 DIAGNOSIS — Z0389 Encounter for observation for other suspected diseases and conditions ruled out: Secondary | ICD-10-CM | POA: Diagnosis not present

## 2023-08-27 DIAGNOSIS — R1084 Generalized abdominal pain: Secondary | ICD-10-CM | POA: Diagnosis not present

## 2023-08-27 DIAGNOSIS — S79921A Unspecified injury of right thigh, initial encounter: Secondary | ICD-10-CM | POA: Diagnosis not present

## 2023-08-27 DIAGNOSIS — M1611 Unilateral primary osteoarthritis, right hip: Secondary | ICD-10-CM | POA: Diagnosis not present

## 2023-08-27 DIAGNOSIS — S8252XA Displaced fracture of medial malleolus of left tibia, initial encounter for closed fracture: Secondary | ICD-10-CM | POA: Diagnosis not present

## 2023-08-27 DIAGNOSIS — S99922A Unspecified injury of left foot, initial encounter: Secondary | ICD-10-CM | POA: Diagnosis present

## 2023-08-27 DIAGNOSIS — R609 Edema, unspecified: Secondary | ICD-10-CM | POA: Diagnosis not present

## 2023-08-27 DIAGNOSIS — Z79899 Other long term (current) drug therapy: Secondary | ICD-10-CM | POA: Diagnosis not present

## 2023-08-27 DIAGNOSIS — S8252XD Displaced fracture of medial malleolus of left tibia, subsequent encounter for closed fracture with routine healing: Secondary | ICD-10-CM | POA: Diagnosis not present

## 2023-08-27 DIAGNOSIS — R079 Chest pain, unspecified: Secondary | ICD-10-CM | POA: Diagnosis not present

## 2023-08-27 DIAGNOSIS — R220 Localized swelling, mass and lump, head: Secondary | ICD-10-CM | POA: Diagnosis not present

## 2023-08-27 DIAGNOSIS — S0993XA Unspecified injury of face, initial encounter: Secondary | ICD-10-CM | POA: Diagnosis not present

## 2023-08-27 DIAGNOSIS — Z87891 Personal history of nicotine dependence: Secondary | ICD-10-CM | POA: Diagnosis not present

## 2023-08-27 DIAGNOSIS — Y92410 Unspecified street and highway as the place of occurrence of the external cause: Secondary | ICD-10-CM | POA: Diagnosis not present

## 2023-08-27 DIAGNOSIS — S80811A Abrasion, right lower leg, initial encounter: Secondary | ICD-10-CM | POA: Diagnosis not present

## 2023-08-27 LAB — URINALYSIS, ROUTINE W REFLEX MICROSCOPIC
Bilirubin Urine: NEGATIVE
Glucose, UA: NEGATIVE mg/dL
Hgb urine dipstick: NEGATIVE
Ketones, ur: NEGATIVE mg/dL
Leukocytes,Ua: NEGATIVE
Nitrite: NEGATIVE
Protein, ur: NEGATIVE mg/dL
Specific Gravity, Urine: 1.019 (ref 1.005–1.030)
pH: 6 (ref 5.0–8.0)

## 2023-08-27 LAB — ETHANOL: Alcohol, Ethyl (B): <10 mg/dL (ref <10)

## 2023-08-27 LAB — CBC
HCT: 35.1 % — ABNORMAL LOW (ref 36.0–46.0)
Hemoglobin: 11.2 g/dL — ABNORMAL LOW (ref 12.0–15.0)
MCH: 27 pg (ref 26.0–34.0)
MCHC: 31.9 g/dL (ref 30.0–36.0)
MCV: 84.6 fL (ref 80.0–100.0)
Platelets: 390 10*3/uL (ref 150–400)
RBC: 4.15 MIL/uL (ref 3.87–5.11)
RDW: 14.5 % (ref 11.5–15.5)
WBC: 13.1 10*3/uL — ABNORMAL HIGH (ref 4.0–10.5)
nRBC: 0 % (ref 0.0–0.2)

## 2023-08-27 LAB — I-STAT CHEM 8, ED
BUN: 14 mg/dL (ref 6–20)
Calcium, Ion: 1.1 mmol/L — ABNORMAL LOW (ref 1.15–1.40)
Chloride: 105 mmol/L (ref 98–111)
Creatinine, Ser: 1.3 mg/dL — ABNORMAL HIGH (ref 0.44–1.00)
Glucose, Bld: 155 mg/dL — ABNORMAL HIGH (ref 70–99)
HCT: 37 % (ref 36.0–46.0)
Hemoglobin: 12.6 g/dL (ref 12.0–15.0)
Potassium: 3.7 mmol/L (ref 3.5–5.1)
Sodium: 139 mmol/L (ref 135–145)
TCO2: 22 mmol/L (ref 22–32)

## 2023-08-27 LAB — I-STAT CG4 LACTIC ACID, ED: Lactic Acid, Venous: 2.3 mmol/L (ref 0.5–1.9)

## 2023-08-27 LAB — COMPREHENSIVE METABOLIC PANEL
ALT: 15 U/L (ref 0–44)
AST: 17 U/L (ref 15–41)
Albumin: 3.1 g/dL — ABNORMAL LOW (ref 3.5–5.0)
Alkaline Phosphatase: 108 U/L (ref 38–126)
Anion gap: 14 (ref 5–15)
BUN: 14 mg/dL (ref 6–20)
CO2: 20 mmol/L — ABNORMAL LOW (ref 22–32)
Calcium: 8.5 mg/dL — ABNORMAL LOW (ref 8.9–10.3)
Chloride: 103 mmol/L (ref 98–111)
Creatinine, Ser: 1.24 mg/dL — ABNORMAL HIGH (ref 0.44–1.00)
GFR, Estimated: 59 mL/min — ABNORMAL LOW (ref >60)
Glucose, Bld: 154 mg/dL — ABNORMAL HIGH (ref 70–99)
Potassium: 3.6 mmol/L (ref 3.5–5.1)
Sodium: 137 mmol/L (ref 135–145)
Total Bilirubin: 0.4 mg/dL (ref 0.0–1.2)
Total Protein: 7.3 g/dL (ref 6.5–8.1)

## 2023-08-27 LAB — SAMPLE TO BLOOD BANK

## 2023-08-27 LAB — PROTIME-INR
INR: 1 (ref 0.8–1.2)
Prothrombin Time: 13.4 s (ref 11.4–15.2)

## 2023-08-27 MED ORDER — TETANUS-DIPHTH-ACELL PERTUSSIS 5-2.5-18.5 LF-MCG/0.5 IM SUSY
0.5000 mL | PREFILLED_SYRINGE | Freq: Once | INTRAMUSCULAR | Status: AC
  Administered 2023-08-27: 0.5 mL via INTRAMUSCULAR

## 2023-08-27 MED ORDER — TETANUS-DIPHTH-ACELL PERTUSSIS 5-2.5-18.5 LF-MCG/0.5 IM SUSY
PREFILLED_SYRINGE | INTRAMUSCULAR | Status: AC
  Filled 2023-08-27: qty 0.5

## 2023-08-27 MED ORDER — IOHEXOL 350 MG/ML SOLN
100.0000 mL | Freq: Once | INTRAVENOUS | Status: AC | PRN
  Administered 2023-08-27: 100 mL via INTRAVENOUS

## 2023-08-27 MED ORDER — CEFAZOLIN SODIUM-DEXTROSE 1-4 GM/50ML-% IV SOLN
1.0000 g | Freq: Once | INTRAVENOUS | Status: AC
  Administered 2023-08-27: 1 g via INTRAVENOUS
  Filled 2023-08-27: qty 50

## 2023-08-27 MED ORDER — HYDROMORPHONE HCL 1 MG/ML IJ SOLN
0.5000 mg | Freq: Once | INTRAMUSCULAR | Status: AC
  Administered 2023-08-27: 0.5 mg via INTRAVENOUS
  Filled 2023-08-27: qty 1

## 2023-08-27 MED ORDER — FENTANYL CITRATE PF 50 MCG/ML IJ SOSY
100.0000 ug | PREFILLED_SYRINGE | Freq: Once | INTRAMUSCULAR | Status: AC
  Administered 2023-08-27: 100 ug via INTRAVENOUS
  Filled 2023-08-27: qty 2

## 2023-08-27 MED ORDER — MIDAZOLAM HCL 2 MG/2ML IJ SOLN
INTRAMUSCULAR | Status: AC | PRN
  Administered 2023-08-27: 2 mg via INTRAVENOUS

## 2023-08-27 MED ORDER — FENTANYL CITRATE PF 50 MCG/ML IJ SOSY
100.0000 ug | PREFILLED_SYRINGE | Freq: Once | INTRAMUSCULAR | Status: AC
  Administered 2023-08-27: 100 ug via INTRAVENOUS

## 2023-08-27 MED ORDER — FENTANYL CITRATE PF 50 MCG/ML IJ SOSY: 50.0000 ug | PREFILLED_SYRINGE | Freq: Once | INTRAMUSCULAR | Status: DC

## 2023-08-27 MED ORDER — MIDAZOLAM HCL 2 MG/2ML IJ SOLN
INTRAMUSCULAR | Status: AC
  Filled 2023-08-27: qty 2

## 2023-08-27 MED ORDER — SODIUM CHLORIDE 0.9 % IV SOLN
INTRAVENOUS | Status: AC | PRN
  Administered 2023-08-27: 1000 mL via INTRAVENOUS

## 2023-08-27 MED ORDER — FENTANYL CITRATE PF 50 MCG/ML IJ SOSY
PREFILLED_SYRINGE | INTRAMUSCULAR | Status: AC
  Filled 2023-08-27: qty 2

## 2023-08-27 NOTE — ED Notes (Signed)
 Pt taken to CT with TRN
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
## 2023-08-27 NOTE — Progress Notes (Signed)
 Orthopedic Tech Progress Note Patient Details:  Shirley [name] 30-Aug-1990 [id number]  Patient ID: [name], female   DOB: Aug 21, 1990, 33 y.o.   MRN: [id number] I attended trauma page. [name] Post 08/27/2023, 10:05 PM
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
## 2023-08-27 NOTE — ED Notes (Signed)
 Ortho tech contacted for splint
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
## 2023-08-27 NOTE — Telephone Encounter (Signed)
 Call back, as agreed-upon by pt, after asking Neurosurgery provider, Joan Flores, PA-C, if pt can switch her Neurosurgery consult visit on 09/01/23 from in-person to virtual.   Pt is informed that, yes, she can be seen virtually on 09/01/23 for her Neurosurgery appointment; is also informed that her voicemail is full. Pt will check her voicemail to make sure it's not full for future calls that may come in; will attend her upcoming appointment virtually on 09/01/23.

## 2023-08-28 ENCOUNTER — Encounter: Payer: Self-pay | Admitting: Obstetrics and Gynecology

## 2023-08-28 ENCOUNTER — Other Ambulatory Visit: Payer: Self-pay

## 2023-08-28 ENCOUNTER — Emergency Department (HOSPITAL_COMMUNITY)
Admission: EM | Admit: 2023-08-28 | Discharge: 2023-08-28 | Disposition: A | Discharge status: Home / Self Care | Payer: Medicaid Other | Source: Home / Self Care | Attending: Student | Admitting: Student

## 2023-08-28 DIAGNOSIS — Y92410 Unspecified street and highway as the place of occurrence of the external cause: Secondary | ICD-10-CM | POA: Insufficient documentation

## 2023-08-28 DIAGNOSIS — S92312D Displaced fracture of first metatarsal bone, left foot, subsequent encounter for fracture with routine healing: Secondary | ICD-10-CM | POA: Insufficient documentation

## 2023-08-28 DIAGNOSIS — I10 Essential (primary) hypertension: Secondary | ICD-10-CM | POA: Insufficient documentation

## 2023-08-28 DIAGNOSIS — Z79899 Other long term (current) drug therapy: Secondary | ICD-10-CM | POA: Insufficient documentation

## 2023-08-28 DIAGNOSIS — S8252XA Displaced fracture of medial malleolus of left tibia, initial encounter for closed fracture: Secondary | ICD-10-CM | POA: Diagnosis not present

## 2023-08-28 DIAGNOSIS — Z87891 Personal history of nicotine dependence: Secondary | ICD-10-CM | POA: Insufficient documentation

## 2023-08-28 DIAGNOSIS — S8252XD Displaced fracture of medial malleolus of left tibia, subsequent encounter for closed fracture with routine healing: Secondary | ICD-10-CM | POA: Insufficient documentation

## 2023-08-28 DIAGNOSIS — S92315A Nondisplaced fracture of first metatarsal bone, left foot, initial encounter for closed fracture: Secondary | ICD-10-CM | POA: Diagnosis not present

## 2023-08-28 LAB — PREGNANCY, URINE: Preg Test, Ur: NEGATIVE

## 2023-08-28 MED ORDER — OXYCODONE HCL 5 MG PO TABS
10.0000 mg | ORAL_TABLET | Freq: Once | ORAL | Status: AC
  Administered 2023-08-28: 10 mg via ORAL
  Filled 2023-08-28: qty 2

## 2023-08-28 MED ORDER — OXYCODONE HCL 5 MG PO TABS
5.0000 mg | ORAL_TABLET | Freq: Once | ORAL | Status: AC
  Administered 2023-08-28: 5 mg via ORAL
  Filled 2023-08-28: qty 1

## 2023-08-28 MED ORDER — KETOROLAC TROMETHAMINE 15 MG/ML IJ SOLN
15.0000 mg | Freq: Once | INTRAMUSCULAR | Status: AC
  Administered 2023-08-28: 15 mg via INTRAVENOUS
  Filled 2023-08-28: qty 1

## 2023-08-28 MED ORDER — ACETAMINOPHEN 500 MG PO TABS: 1000.0000 mg | ORAL_TABLET | Freq: Three times a day (TID) | ORAL | 0 refills | Status: AC

## 2023-08-28 MED ORDER — BACITRACIN ZINC 500 UNIT/GM EX OINT
TOPICAL_OINTMENT | Freq: Two times a day (BID) | CUTANEOUS | Status: DC
  Filled 2023-08-28: qty 0.9

## 2023-08-28 MED ORDER — NAPROXEN 375 MG PO TABS: 375.0000 mg | ORAL_TABLET | Freq: Two times a day (BID) | ORAL | 0 refills | Status: DC

## 2023-08-28 MED ORDER — OXYCODONE HCL 5 MG PO TABS: 5.0000 mg | ORAL_TABLET | ORAL | 0 refills | Status: DC | PRN

## 2023-08-28 MED ORDER — OXYCODONE HCL 10 MG PO TABS: 10.0000 mg | ORAL_TABLET | Freq: Four times a day (QID) | ORAL | 0 refills | Status: DC | PRN

## 2023-08-28 MED ORDER — ACETAMINOPHEN 500 MG PO TABS
1000.0000 mg | ORAL_TABLET | Freq: Once | ORAL | Status: AC
  Administered 2023-08-28: 1000 mg via ORAL
  Filled 2023-08-28: qty 2

## 2023-08-28 MED ORDER — IBUPROFEN 800 MG PO TABS
800.0000 mg | ORAL_TABLET | Freq: Once | ORAL | Status: AC
  Administered 2023-08-28: 800 mg via ORAL
  Filled 2023-08-28: qty 1

## 2023-08-28 MED ORDER — MUPIROCIN CALCIUM 2 % EX CREA
TOPICAL_CREAM | Freq: Once | CUTANEOUS | Status: DC
  Filled 2023-08-28: qty 15

## 2023-08-28 NOTE — ED Notes (Signed)
 C-collar removed per Dr.Schlossman

## 2023-08-28 NOTE — ED Notes (Signed)
 Lab to add on urine pregnancy

## 2023-08-28 NOTE — Discharge Instructions (Signed)
 For pain:  - Acetaminophen 1000 mg three times daily (every 8 hours) - Naproxen 2 times daily (every 12 hours) - oxycodone for breakthrough pain only
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
## 2023-08-28 NOTE — ED Notes (Signed)
 Patient discharged by this RN. Patient verbalizes understanding of instructions with no additional questions.
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
## 2023-08-28 NOTE — Progress Notes (Signed)
 Orthopedic Tech Progress Note Patient Details:  Shirley [name] [birth year] [id number]  Ortho Devices Type of Ortho Device: Crutches, Post (short leg) splint, Stirrup splint Ortho Device/Splint Location: lle Ortho Device/Splint Interventions: Ordered, Application, Adjustment  The patients rn assisted me by holding the leg while I applied the splint. I then applied the splint. Then I taught the patient how to use the crutches. Then then patient used the crutches with no problem. Post Interventions Patient Tolerated: Well Instructions Provided: Care of device, Adjustment of device  [name] Post 08/28/2023, 12:53 AM

## 2023-08-28 NOTE — Discharge Instructions (Signed)
 You were evaluated in the Emergency Department and after careful evaluation, we did not find any emergent condition requiring admission or further testing in the hospital.  Your exam/testing today was overall reassuring.  You have a broken bone in your ankle.  You also may have a tiny chip fracture in your foot next to your big toe.  Keep the splint clean and dry, do not bear weight, and use the crutches provided.  Follow-up with the orthopedic specialist for further management.  Recommend Tylenol 1000 mg every 4-6 hours and/or Motrin 600 mg every 4-6 hours for pain.  You can use the oxycodone medication for more significant pain.  Please return to the Emergency Department if you experience any worsening of your condition.  Thank you for allowing [place] to be a part of your care.

## 2023-08-28 NOTE — ED Provider Triage Note (Signed)
 Emergency Medicine Provider Triage Evaluation Note  Shirley [name] , a 33 y.o. female  was evaluated in triage.  Pt complains of pain from her injuries last night.  She thinks she was doing okay with the IV pain medicine last night but when she got home the oxycodone was not controlling her pain so she called 911.  No new injuries.  Review of Systems  Positive: Pain in multiple areas  Physical Exam  BP (!) 148/108 (BP Location: Right Arm)   Pulse 98   Temp 97.9 F (36.6 C)   Resp (!) 22   Ht 5\' 6"  (1.676 m)   Wt (!) 137 kg   SpO2 99%   BMI 48.74 kg/m  Gen:   Awake, no distress, moaning in pain Resp:  Normal effort  MSK:   Left lower extremity in a splint  Medical Decision Making  Medically screening exam initiated at 8:22 AM.  Appropriate orders placed.  Shirley [name] was informed that the remainder of the evaluation will be completed by another provider, this initial triage assessment does not replace that evaluation, and the importance of remaining in the ED until their evaluation is complete.  Pain medication ordered, will need reevaluation when in the treatment room.  Vital signs stable.   Shirley Loveless, MD 08/28/23 [phone]

## 2023-08-29 DIAGNOSIS — M25572 Pain in left ankle and joints of left foot: Secondary | ICD-10-CM | POA: Diagnosis not present
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
## 2023-09-01 ENCOUNTER — Ambulatory Visit (INDEPENDENT_AMBULATORY_CARE_PROVIDER_SITE_OTHER): Payer: Medicaid Other | Admitting: Physician Assistant

## 2023-09-01 ENCOUNTER — Other Ambulatory Visit: Payer: Self-pay | Admitting: Orthopaedic Surgery

## 2023-09-01 ENCOUNTER — Telehealth: Payer: Self-pay | Admitting: Cardiology

## 2023-09-01 DIAGNOSIS — M542 Cervicalgia: Secondary | ICD-10-CM

## 2023-09-01 DIAGNOSIS — R202 Paresthesia of skin: Secondary | ICD-10-CM | POA: Diagnosis not present

## 2023-09-01 DIAGNOSIS — R937 Abnormal findings on diagnostic imaging of other parts of musculoskeletal system: Secondary | ICD-10-CM

## 2023-09-01 DIAGNOSIS — R200 Anesthesia of skin: Secondary | ICD-10-CM

## 2023-09-01 NOTE — Telephone Encounter (Signed)
 Pt has been scheduled in office appt with Shirley [name], PAC. Pt is aware of address location for the surgery clearance appt. Pt aware to keep her appt 10/07/23 unless [name] Wed appt says ok to cancel.   I will update all parties involved.

## 2023-09-01 NOTE — Telephone Encounter (Signed)
   Name: Shirley [name]  DOB: Apr 11, 1991  MRN: [id number]  Primary Cardiologist: None  Chart reviewed as part of pre-operative protocol coverage. Because of Shirley [name] past medical history and time since last visit, she will require a follow-up in-office visit in order to better assess preoperative cardiovascular risk.  Pre-op covering staff: - Please schedule appointment and call patient to inform them. If patient already had an upcoming appointment within acceptable timeframe, please add "pre-op clearance" to the appointment notes so provider is aware. - Please contact requesting surgeon's office via preferred method (i.e, phone, fax) to inform them of need for appointment prior to surgery.   Perlie Gold, PA-C  09/01/2023, 1:56 PM
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
## 2023-09-01 NOTE — Addendum Note (Signed)
 Addended by: Joan Flores on: 09/01/2023 03:41 PM   Modules accepted: Orders, Level of Service

## 2023-09-01 NOTE — Telephone Encounter (Signed)
   Pre-operative Risk Assessment    Patient Name: Shirley [name]  DOB: 1990/08/18 MRN: [id number]      Request for Surgical Clearance    Procedure:   Ankle Arthroscopy With Open Treatment Internal Fixation of Fractures    Date of Surgery:  Clearance 09/04/23                                 Surgeon:  Dr. Dub Mikes Surgeon's Group or Practice Name:  [facility] Phone number:  [phone] Fax number:  [phone]   Type of Clearance Requested:   - Medical  - Pharmacy:  Hold        Type of Anesthesia:  General    Additional requests/questions:   Requesting for medical and/or pharmacy clearance.  Signed, Tawni Millers   09/01/2023, 1:35 PM

## 2023-09-02 ENCOUNTER — Ambulatory Visit: Admitting: Student

## 2023-09-02 ENCOUNTER — Ambulatory Visit
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
## 2023-09-03 ENCOUNTER — Encounter (HOSPITAL_COMMUNITY): Payer: Self-pay | Admitting: Orthopaedic Surgery

## 2023-09-03 ENCOUNTER — Ambulatory Visit: Attending: Physician Assistant | Admitting: Physician Assistant

## 2023-09-03 ENCOUNTER — Encounter: Payer: Self-pay | Admitting: Physician Assistant

## 2023-09-03 ENCOUNTER — Other Ambulatory Visit: Payer: Self-pay

## 2023-09-03 ENCOUNTER — Encounter: Payer: Self-pay | Admitting: Neurology

## 2023-09-03 VITALS — BP 136/76 | HR 61 | Ht 65.0 in | Wt 300.0 lb

## 2023-09-03 DIAGNOSIS — I5030 Unspecified diastolic (congestive) heart failure: Secondary | ICD-10-CM

## 2023-09-03 DIAGNOSIS — Z01818 Encounter for other preprocedural examination: Secondary | ICD-10-CM

## 2023-09-03 DIAGNOSIS — M25572 Pain in left ankle and joints of left foot: Secondary | ICD-10-CM | POA: Diagnosis not present

## 2023-09-03 DIAGNOSIS — I10 Essential (primary) hypertension: Secondary | ICD-10-CM | POA: Diagnosis not present

## 2023-09-03 DIAGNOSIS — I340 Nonrheumatic mitral (valve) insufficiency: Secondary | ICD-10-CM

## 2023-09-03 DIAGNOSIS — R202 Paresthesia of skin: Secondary | ICD-10-CM

## 2023-09-03 MED ORDER — CARVEDILOL 25 MG PO TABS: 25.0000 mg | ORAL_TABLET | Freq: Two times a day (BID) | ORAL | 3 refills | Status: DC

## 2023-09-03 MED ORDER — NIFEDIPINE ER 60 MG PO TB24: 60.0000 mg | ORAL_TABLET | Freq: Two times a day (BID) | ORAL | 3 refills | Status: DC

## 2023-09-03 NOTE — Patient Instructions (Signed)
 Medication Instructions:  No Changes *If you need a refill on your cardiac medications before your next appointment, please call your pharmacy*   Lab Work: No Labs If you have labs (blood work) drawn today and your tests are completely normal, you will receive your results only by: MyChart Message (if you have MyChart) OR A paper copy in the mail If you have any lab test that is abnormal or we need to change your treatment, we will call you to review the results.   Testing/Procedures:[address], [address]. ( August 2025) Your physician has requested that you have an echocardiogram. Echocardiography is a painless test that uses sound waves to create images of your heart. It provides your doctor with information about the size and shape of your heart and how well your heart's chambers and valves are working. This procedure takes approximately one hour. There are no restrictions for this procedure. Please do NOT wear cologne, perfume, aftershave, or lotions (deodorant is allowed). Please arrive 15 minutes prior to your appointment time.  Please note: We ask at that you not bring children with you during ultrasound (echo/ vascular) testing. Due to room size and safety concerns, children are not allowed in the ultrasound rooms during exams. Our front office staff cannot provide observation of children in our lobby area while testing is being conducted. An adult accompanying a patient to their appointment will only be allowed in the ultrasound room at the discretion of the ultrasound technician under special circumstances. We apologize for any inconvenience.    Follow-Up: At [facility], you and your health needs are our priority.  As part of our continuing mission to provide you with exceptional heart care, we have created designated Provider Care Teams.  These Care Teams include your primary Cardiologist (physician) and Advanced Practice Providers (APPs -  Physician  Assistants and Nurse Practitioners) who all work together to provide you with the care you need, when you need it.  We recommend signing up for the patient portal called "MyChart".  Sign up information is provided on this After Visit Summary.  MyChart is used to connect with patients for Virtual Visits (Telemedicine).  Patients are able to view lab/test results, encounter notes, upcoming appointments, etc.  Non-urgent messages can be sent to your provider as well.   To learn more about what you can do with MyChart, go to [email or web address].    Your next appointment:   6 month(s)  Provider:   Tessa Lerner, DO   Other Instructions   1st Floor: - Lobby - Registration  - Pharmacy  - Lab - Cafe  2nd Floor: - PV Lab - Diagnostic Testing (echo, CT, nuclear med)  3rd Floor: - Vacant  4th Floor: - TCTS (cardiothoracic surgery) - AFib Clinic - Structural Heart Clinic - Vascular Surgery  - Vascular Ultrasound  5th Floor: - [facility]Care Cardiology (general and EP) - Clinical Pharmacy for coumadin, hypertension, lipid, weight-loss medications, and med management appointments    Valet parking services will be available as well.

## 2023-09-03 NOTE — Progress Notes (Signed)
 Spoke with patient for SDW call attempt.  She is going to Dr. Donnie Mesa office at this time re: one of her feet that is swelling and she is having pain.  She will call back once they decide if she is having surgery tomorrow, 09/04/2023 or re-scheduling.
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
## 2023-09-03 NOTE — Progress Notes (Signed)
 SDW call  Patient was given pre-op instructions over the phone. Patient verbalized understanding of instructions provided.     PCP - Dr. Erick Alley Cardiologist - Juanda Crumble, PA-C for clearance Pulmonary:    PPM/ICD - denies Device Orders - na Rep Notified - na   Chest x-ray - 08/27/2023 EKG -  09/03/2023 Stress Test -  ECHO - 07/05/2021 Cardiac Cath -   Sleep Study/sleep apnea/CPAP: denies  Non-diabetic  Blood Thinner Instructions: denies Aspirin Instructions:denies   ERAS Protcol - Clears until 0830   Anesthesia review: Yes. Pulmonary edema, SOB, HTN   Patient denies shortness of breath, fever, cough and chest pain over the phone call  Your procedure is scheduled on Thursday September 04, 2023  Report to [facility] Main Entrance "A" at  0900  A.M., then check in with the Admitting office.  Call this number if you have problems the morning of surgery:  [phone]   If you have any questions prior to your surgery date call [phone]: Open Monday-Friday 8am-4pm If you experience any cold or flu symptoms such as cough, fever, chills, shortness of breath, etc. between now and your scheduled surgery, please notify us at the above number    Remember:  Do not eat after midnight the night before your surgery  You may drink clear liquids until 0830   the morning of your surgery.   Clear liquids allowed are: Water, Non-Citrus Juices (without pulp), Carbonated Beverages, Clear Tea, Black Coffee ONLY (NO MILK, CREAM OR POWDERED CREAMER of any kind), and Gatorade   Take these medicines the morning of surgery with A SIP OF WATER:  Carvedilol, nifedipine, gabapentin  As needed: Oxycodone, tylenol  As of today, STOP taking any Aspirin (unless otherwise instructed by your surgeon) Aleve, Naproxen, Ibuprofen, Motrin, Advil, Goody's, BC's, all herbal medications, fish oil, and all vitamins.
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
## 2023-09-04 ENCOUNTER — Other Ambulatory Visit: Payer: Self-pay

## 2023-09-04 ENCOUNTER — Ambulatory Visit (HOSPITAL_COMMUNITY)

## 2023-09-04 ENCOUNTER — Ambulatory Visit (HOSPITAL_COMMUNITY)
Admission: RE | Admit: 2023-09-04 | Discharge: 2023-09-04 | Disposition: A | Discharge status: Home / Self Care | Attending: Orthopaedic Surgery | Admitting: Orthopaedic Surgery

## 2023-09-04 ENCOUNTER — Encounter (HOSPITAL_COMMUNITY)
Admission: RE | Disposition: A | Discharge status: Home / Self Care | Payer: Self-pay | Source: Home / Self Care | Attending: Orthopaedic Surgery

## 2023-09-04 ENCOUNTER — Encounter (HOSPITAL_COMMUNITY): Payer: Self-pay | Admitting: Orthopaedic Surgery

## 2023-09-04 ENCOUNTER — Ambulatory Visit (HOSPITAL_COMMUNITY): Payer: Self-pay | Admitting: Vascular Surgery

## 2023-09-04 ENCOUNTER — Ambulatory Visit (HOSPITAL_BASED_OUTPATIENT_CLINIC_OR_DEPARTMENT_OTHER): Payer: Self-pay | Admitting: Vascular Surgery

## 2023-09-04 DIAGNOSIS — Z87891 Personal history of nicotine dependence: Secondary | ICD-10-CM | POA: Diagnosis not present

## 2023-09-04 DIAGNOSIS — S8252XA Displaced fracture of medial malleolus of left tibia, initial encounter for closed fracture: Secondary | ICD-10-CM

## 2023-09-04 DIAGNOSIS — Z79899 Other long term (current) drug therapy: Secondary | ICD-10-CM | POA: Diagnosis not present

## 2023-09-04 DIAGNOSIS — Z8249 Family history of ischemic heart disease and other diseases of the circulatory system: Secondary | ICD-10-CM | POA: Insufficient documentation

## 2023-09-04 DIAGNOSIS — E6689 Other obesity not elsewhere classified: Secondary | ICD-10-CM | POA: Insufficient documentation

## 2023-09-04 DIAGNOSIS — G8918 Other acute postprocedural pain: Secondary | ICD-10-CM | POA: Diagnosis not present

## 2023-09-04 DIAGNOSIS — Z5982 Transportation insecurity: Secondary | ICD-10-CM | POA: Diagnosis not present

## 2023-09-04 DIAGNOSIS — X58XXXA Exposure to other specified factors, initial encounter: Secondary | ICD-10-CM | POA: Insufficient documentation

## 2023-09-04 DIAGNOSIS — M25872 Other specified joint disorders, left ankle and foot: Secondary | ICD-10-CM | POA: Diagnosis not present

## 2023-09-04 DIAGNOSIS — G709 Myoneural disorder, unspecified: Secondary | ICD-10-CM | POA: Diagnosis not present

## 2023-09-04 DIAGNOSIS — M25072 Hemarthrosis, left ankle: Secondary | ICD-10-CM | POA: Diagnosis not present

## 2023-09-04 DIAGNOSIS — M069 Rheumatoid arthritis, unspecified: Secondary | ICD-10-CM | POA: Diagnosis not present

## 2023-09-04 DIAGNOSIS — I10 Essential (primary) hypertension: Secondary | ICD-10-CM | POA: Diagnosis not present

## 2023-09-04 DIAGNOSIS — Z6841 Body Mass Index (BMI) 40.0 and over, adult: Secondary | ICD-10-CM | POA: Diagnosis not present

## 2023-09-04 DIAGNOSIS — F418 Other specified anxiety disorders: Secondary | ICD-10-CM

## 2023-09-04 DIAGNOSIS — S99812A Other specified injuries of left ankle, initial encounter: Secondary | ICD-10-CM | POA: Diagnosis present

## 2023-09-04 DIAGNOSIS — I119 Hypertensive heart disease without heart failure: Secondary | ICD-10-CM | POA: Insufficient documentation

## 2023-09-04 DIAGNOSIS — K219 Gastro-esophageal reflux disease without esophagitis: Secondary | ICD-10-CM | POA: Insufficient documentation

## 2023-09-04 DIAGNOSIS — S92312A Displaced fracture of first metatarsal bone, left foot, initial encounter for closed fracture: Secondary | ICD-10-CM | POA: Insufficient documentation

## 2023-09-04 DIAGNOSIS — M1991 Primary osteoarthritis, unspecified site: Secondary | ICD-10-CM | POA: Insufficient documentation

## 2023-09-04 DIAGNOSIS — I340 Nonrheumatic mitral (valve) insufficiency: Secondary | ICD-10-CM | POA: Diagnosis not present

## 2023-09-04 HISTORY — PX: ARTHROSCOPY, ANKLE WITH DEBRIDEMENT: SHX7318

## 2023-09-04 HISTORY — PX: ORIF ANKLE FRACTURE: SHX5408

## 2023-09-04 SURGERY — OPEN REDUCTION INTERNAL FIXATION (ORIF) ANKLE FRACTURE
Anesthesia: General | Site: Ankle | Laterality: Left

## 2023-09-04 MED ORDER — MEPERIDINE HCL 25 MG/ML IJ SOLN: 6.2500 mg | INTRAMUSCULAR | Status: DC | PRN

## 2023-09-04 MED ORDER — EPHEDRINE SULFATE-NACL 50-0.9 MG/10ML-% IV SOSY
PREFILLED_SYRINGE | INTRAVENOUS | Status: DC | PRN
  Administered 2023-09-04 (×5): 5 mg via INTRAVENOUS

## 2023-09-04 MED ORDER — FENTANYL CITRATE (PF) 100 MCG/2ML IJ SOLN: 25.0000 ug | INTRAMUSCULAR | Status: DC | PRN

## 2023-09-04 MED ORDER — GLYCOPYRROLATE PF 0.2 MG/ML IJ SOSY
PREFILLED_SYRINGE | INTRAMUSCULAR | Status: DC | PRN
  Administered 2023-09-04 (×2): .1 mg via INTRAVENOUS

## 2023-09-04 MED ORDER — ASPIRIN 325 MG PO TABS: ORAL_TABLET | ORAL | 0 refills | Status: DC

## 2023-09-04 MED ORDER — 0.9 % SODIUM CHLORIDE (POUR BTL) OPTIME
TOPICAL | Status: DC | PRN
  Administered 2023-09-04: 1000 mL

## 2023-09-04 MED ORDER — SUGAMMADEX SODIUM 200 MG/2ML IV SOLN
INTRAVENOUS | Status: DC | PRN
  Administered 2023-09-04: 200 mg via INTRAVENOUS

## 2023-09-04 MED ORDER — CELECOXIB 200 MG PO CAPS
200.0000 mg | ORAL_CAPSULE | Freq: Once | ORAL | Status: AC
  Administered 2023-09-04: 200 mg via ORAL
  Filled 2023-09-04: qty 1

## 2023-09-04 MED ORDER — NALOXONE HCL 0.4 MG/ML IJ SOLN
INTRAMUSCULAR | Status: DC | PRN
Start: 2023-09-04 — End: 2023-09-04
  Administered 2023-09-04 (×3): 80 ug via INTRAVENOUS
  Administered 2023-09-04: 40 ug via INTRAVENOUS
  Administered 2023-09-04: 80 ug via INTRAVENOUS

## 2023-09-04 MED ORDER — OXYCODONE HCL 5 MG PO TABS: 5.0000 mg | ORAL_TABLET | Freq: Once | ORAL | Status: DC | PRN

## 2023-09-04 MED ORDER — LIDOCAINE 2% (20 MG/ML) 5 ML SYRINGE
INTRAMUSCULAR | Status: DC | PRN
  Administered 2023-09-04: 100 mg via INTRAVENOUS

## 2023-09-04 MED ORDER — SUCCINYLCHOLINE CHLORIDE 200 MG/10ML IV SOSY
PREFILLED_SYRINGE | INTRAVENOUS | Status: DC | PRN
  Administered 2023-09-04: 200 mg via INTRAVENOUS

## 2023-09-04 MED ORDER — ONDANSETRON HCL 4 MG/2ML IJ SOLN: 4.0000 mg | Freq: Once | INTRAMUSCULAR | Status: DC | PRN

## 2023-09-04 MED ORDER — BUPIVACAINE HCL (PF) 0.5 % IJ SOLN
INTRAMUSCULAR | Status: DC | PRN
  Administered 2023-09-04: 10 mL via PERINEURAL

## 2023-09-04 MED ORDER — FENTANYL CITRATE (PF) 250 MCG/5ML IJ SOLN
INTRAMUSCULAR | Status: DC | PRN
Start: 2023-09-04 — End: 2023-09-04
  Administered 2023-09-04: 100 ug via INTRAVENOUS

## 2023-09-04 MED ORDER — BUPIVACAINE LIPOSOME 1.3 % IJ SUSP
INTRAMUSCULAR | Status: DC | PRN
  Administered 2023-09-04: 10 mL via PERINEURAL

## 2023-09-04 MED ORDER — LACTATED RINGERS IV SOLN: INTRAVENOUS | Status: DC

## 2023-09-04 MED ORDER — OXYCODONE HCL 5 MG/5ML PO SOLN: 5.0000 mg | Freq: Once | ORAL | Status: DC | PRN

## 2023-09-04 MED ORDER — ONDANSETRON HCL 4 MG/2ML IJ SOLN
INTRAMUSCULAR | Status: DC | PRN
  Administered 2023-09-04: 4 mg via INTRAVENOUS

## 2023-09-04 MED ORDER — MIDAZOLAM HCL 2 MG/2ML IJ SOLN
INTRAMUSCULAR | Status: AC
  Administered 2023-09-04: 2 mg
  Filled 2023-09-04: qty 2

## 2023-09-04 MED ORDER — SODIUM CHLORIDE 0.9 % IR SOLN
Status: DC | PRN
  Administered 2023-09-04: 3000 mL

## 2023-09-04 MED ORDER — CHLORHEXIDINE GLUCONATE 0.12 % MT SOLN
15.0000 mL | Freq: Once | OROMUCOSAL | Status: AC
  Administered 2023-09-04: 15 mL via OROMUCOSAL
  Filled 2023-09-04: qty 15

## 2023-09-04 MED ORDER — ALBUTEROL SULFATE HFA 108 (90 BASE) MCG/ACT IN AERS
INHALATION_SPRAY | RESPIRATORY_TRACT | Status: DC | PRN
  Administered 2023-09-04: 4 via RESPIRATORY_TRACT
  Administered 2023-09-04 (×2): 3 via RESPIRATORY_TRACT

## 2023-09-04 MED ORDER — ROPIVACAINE HCL 5 MG/ML IJ SOLN
INTRAMUSCULAR | Status: DC | PRN
  Administered 2023-09-04: 15 mL via PERINEURAL

## 2023-09-04 MED ORDER — PROPOFOL 1000 MG/100ML IV EMUL
INTRAVENOUS | Status: AC
  Filled 2023-09-04: qty 300

## 2023-09-04 MED ORDER — ACETAMINOPHEN 500 MG PO TABS
1000.0000 mg | ORAL_TABLET | Freq: Once | ORAL | Status: AC
  Administered 2023-09-04: 1000 mg via ORAL
  Filled 2023-09-04: qty 2

## 2023-09-04 MED ORDER — MIDAZOLAM HCL 2 MG/2ML IJ SOLN
INTRAMUSCULAR | Status: AC
  Filled 2023-09-04: qty 2

## 2023-09-04 MED ORDER — NALOXONE HCL 0.4 MG/ML IJ SOLN
INTRAMUSCULAR | Status: AC
  Filled 2023-09-04: qty 1

## 2023-09-04 MED ORDER — DEXMEDETOMIDINE HCL IN NACL 80 MCG/20ML IV SOLN
INTRAVENOUS | Status: DC | PRN
  Administered 2023-09-04: 8 ug via INTRAVENOUS

## 2023-09-04 MED ORDER — CEFAZOLIN SODIUM-DEXTROSE 3-4 GM/150ML-% IV SOLN
3.0000 g | INTRAVENOUS | Status: AC
  Administered 2023-09-04: 3 g via INTRAVENOUS
  Filled 2023-09-04: qty 150

## 2023-09-04 MED ORDER — PROPOFOL 500 MG/50ML IV EMUL
INTRAVENOUS | Status: DC | PRN
  Administered 2023-09-04: 150 ug/kg/min via INTRAVENOUS

## 2023-09-04 MED ORDER — GABAPENTIN 300 MG PO CAPS
300.0000 mg | ORAL_CAPSULE | ORAL | Status: AC
Start: 2023-09-04 — End: 2023-09-04
  Administered 2023-09-04: 300 mg via ORAL
  Filled 2023-09-04: qty 1

## 2023-09-04 MED ORDER — FLUMAZENIL 0.5 MG/5ML IV SOLN
INTRAVENOUS | Status: DC | PRN
Start: 2023-09-04 — End: 2023-09-04
  Administered 2023-09-04 (×2): .25 mg via INTRAVENOUS

## 2023-09-04 MED ORDER — PROPOFOL 10 MG/ML IV BOLUS
INTRAVENOUS | Status: DC | PRN
  Administered 2023-09-04: 200 mg via INTRAVENOUS

## 2023-09-04 MED ORDER — DEXAMETHASONE SODIUM PHOSPHATE 10 MG/ML IJ SOLN
INTRAMUSCULAR | Status: DC | PRN
  Administered 2023-09-04: 10 mg via INTRAVENOUS

## 2023-09-04 MED ORDER — KETAMINE HCL 50 MG/5ML IJ SOSY
PREFILLED_SYRINGE | INTRAMUSCULAR | Status: AC
  Filled 2023-09-04: qty 5

## 2023-09-04 MED ORDER — KETAMINE HCL 10 MG/ML IJ SOLN
INTRAMUSCULAR | Status: DC | PRN
Start: 2023-09-04 — End: 2023-09-04
  Administered 2023-09-04: 30 mg via INTRAVENOUS

## 2023-09-04 MED ORDER — ACETAMINOPHEN 160 MG/5ML PO SOLN: 325.0000 mg | ORAL | Status: DC | PRN

## 2023-09-04 MED ORDER — FENTANYL CITRATE (PF) 100 MCG/2ML IJ SOLN
INTRAMUSCULAR | Status: AC
  Administered 2023-09-04: 100 ug
  Filled 2023-09-04: qty 2

## 2023-09-04 MED ORDER — PROPOFOL 10 MG/ML IV BOLUS
INTRAVENOUS | Status: AC
  Filled 2023-09-04: qty 20

## 2023-09-04 MED ORDER — FENTANYL CITRATE (PF) 250 MCG/5ML IJ SOLN
INTRAMUSCULAR | Status: AC
  Filled 2023-09-04: qty 5

## 2023-09-04 MED ORDER — ORAL CARE MOUTH RINSE: 15.0000 mL | Freq: Once | OROMUCOSAL | Status: AC

## 2023-09-04 MED ORDER — ACETAMINOPHEN 325 MG PO TABS: 325.0000 mg | ORAL_TABLET | ORAL | Status: DC | PRN

## 2023-09-04 SURGICAL SUPPLY — 53 items
ALCOHOL 70% 16 OZ (MISCELLANEOUS) ×2 IMPLANT
BAG COUNTER SPONGE SURGICOUNT (BAG) ×2 IMPLANT
BANDAGE ESMARK 6X9 LF (GAUZE/BANDAGES/DRESSINGS) IMPLANT
BIT DRILL 2.6 CANN (BIT) IMPLANT
BLADE SURG 15 STRL LF DISP TIS (BLADE) ×2 IMPLANT
BNDG COHESIVE 4X5 TAN STRL LF (GAUZE/BANDAGES/DRESSINGS) IMPLANT
BNDG COHESIVE 6X5 TAN ST LF (GAUZE/BANDAGES/DRESSINGS) IMPLANT
BNDG ELASTIC 6X10 VLCR STRL LF (GAUZE/BANDAGES/DRESSINGS) ×2 IMPLANT
BNDG ESMARK 6X9 LF (GAUZE/BANDAGES/DRESSINGS) IMPLANT
CANISTER SUCT 3000ML PPV (MISCELLANEOUS) ×2 IMPLANT
CHLORAPREP W/TINT 26 (MISCELLANEOUS) ×4 IMPLANT
COVER SURGICAL LIGHT HANDLE (MISCELLANEOUS) ×2 IMPLANT
CUFF TOURN SGL QUICK 42 (TOURNIQUET CUFF) IMPLANT
CUFF TRNQT CYL 34X4.125X (TOURNIQUET CUFF) ×2 IMPLANT
DISSECTOR 3.8MM X 13CM (MISCELLANEOUS) IMPLANT
DRAPE C-ARM 42X120 X-RAY (DRAPES) IMPLANT
DRAPE C-ARMOR (DRAPES) IMPLANT
DRAPE OEC MINIVIEW 54X84 (DRAPES) ×2 IMPLANT
DRAPE U-SHAPE 47X51 STRL (DRAPES) ×2 IMPLANT
DRSG MEPITEL 4X7.2 (GAUZE/BANDAGES/DRESSINGS) ×2 IMPLANT
DRSG XEROFORM 1X8 (GAUZE/BANDAGES/DRESSINGS) ×2 IMPLANT
ELECT REM PT RETURN 9FT ADLT (ELECTROSURGICAL) ×2 IMPLANT
ELECTRODE REM PT RTRN 9FT ADLT (ELECTROSURGICAL) ×2 IMPLANT
GAUZE PAD ABD 8X10 STRL (GAUZE/BANDAGES/DRESSINGS) ×4 IMPLANT
GAUZE SPONGE 4X4 12PLY STRL (GAUZE/BANDAGES/DRESSINGS) IMPLANT
GAUZE SPONGE 4X4 12PLY STRL LF (GAUZE/BANDAGES/DRESSINGS) ×2 IMPLANT
GAUZE XEROFORM 5X9 LF (GAUZE/BANDAGES/DRESSINGS) IMPLANT
GLOVE BIOGEL M STRL SZ7.5 (GLOVE) ×2 IMPLANT
GLOVE BIOGEL PI IND STRL 8 (GLOVE) ×2 IMPLANT
GLOVE SRG 8 PF TXTR STRL LF DI (GLOVE) ×2 IMPLANT
GLOVE SURG ENC TEXT LTX SZ7.5 (GLOVE) ×2 IMPLANT
GOWN STRL REUS W/ TWL LRG LVL3 (GOWN DISPOSABLE) ×2 IMPLANT
GOWN STRL REUS W/ TWL XL LVL3 (GOWN DISPOSABLE) ×4 IMPLANT
GUIDEWIRE 1.35MM (WIRE) IMPLANT
KIT BASIN OR (CUSTOM PROCEDURE TRAY) ×2 IMPLANT
KIT TURNOVER KIT B (KITS) ×2 IMPLANT
NS IRRIG 1000ML POUR BTL (IV SOLUTION) ×2 IMPLANT
PACK ORTHO EXTREMITY (CUSTOM PROCEDURE TRAY) ×2 IMPLANT
PAD ARMBOARD 7.5X6 YLW CONV (MISCELLANEOUS) ×4 IMPLANT
PAD CAST 4YDX4 CTTN HI CHSV (CAST SUPPLIES) ×2 IMPLANT
PADDING CAST COTTON 6X4 STRL (CAST SUPPLIES) IMPLANT
SCREW QCKFIX CANN 4.0X40MM (Screw) IMPLANT
SPLINT PLASTER CAST XFAST 5X30 (CAST SUPPLIES) IMPLANT
SPONGE T-LAP 18X18 ~~LOC~~+RFID (SPONGE) ×2 IMPLANT
STRAP ANKLE DISTRACTOR (MISCELLANEOUS) IMPLANT
SUCTION TUBE FRAZIER 10FR DISP (SUCTIONS) ×2 IMPLANT
SUT ETHILON 3 0 PS 1 (SUTURE) ×2 IMPLANT
SUT MNCRL AB 3-0 PS2 27 (SUTURE) ×2 IMPLANT
SUT VIC AB 2-0 CT1 TAPERPNT 27 (SUTURE) ×4 IMPLANT
TOWEL GREEN STERILE (TOWEL DISPOSABLE) ×2 IMPLANT
TOWEL GREEN STERILE FF (TOWEL DISPOSABLE) ×2 IMPLANT
TUBE CONNECTING 12X1/4 (SUCTIONS) ×2 IMPLANT
TUBING ARTHROSCOPY IRRIG 16FT (MISCELLANEOUS) IMPLANT

## 2023-09-04 NOTE — Anesthesia Postprocedure Evaluation (Signed)
 Anesthesia Post Note  Patient: JAMIRACLE AVANTS  Procedure(s) Performed: OPEN REDUCTION INTERNAL FIXATION (ORIF) MEDIAL MALLEOLUS FRACTURE, CLOSED TREATMENT OF FIRST METATARSAL BASE FRACTURE. OPEN TREATMENT INTERNAL FIXATION OF FIRST TARSOMETATARSAL JOINT (Left: Ankle) ARTHROSCOPY, ANKLE WITH DEBRIDEMENT (Left)     Patient location during evaluation: PACU Anesthesia Type: General Level of consciousness: awake and alert Pain management: pain level controlled Vital Signs Assessment: post-procedure vital signs reviewed and stable Respiratory status: spontaneous breathing, nonlabored ventilation, respiratory function stable and patient connected to nasal cannula oxygen Cardiovascular status: blood pressure returned to baseline and stable Postop Assessment: no apparent nausea or vomiting Anesthetic complications: yes   Encounter Notable Events  Notable Event Outcome Phase Comment  Difficult to intubate - expected  Intraprocedure Filed from anesthesia note documentation.    Last Vitals:  Vitals:   09/04/23 1400 09/04/23 1415  BP: 109/66 123/77  Pulse: 64 (!) 52  Resp: 16 17  Temp:  (!) 36.4 C  SpO2: 100% 100%    Last Pain:  Vitals:   09/04/23 1415  TempSrc:   PainSc: 0-No pain                 Quintez Maselli

## 2023-09-04 NOTE — Anesthesia Procedure Notes (Signed)
 Procedure Name: Intubation Date/Time: 09/04/2023 11:10 AM  Performed by: Jimmey Ralph, CRNAPre-anesthesia Checklist: Patient identified, Emergency Drugs available, Suction available and Patient being monitored Patient Re-evaluated:Patient Re-evaluated prior to induction Oxygen Delivery Method: Circle system utilized Preoxygenation: Pre-oxygenation with 100% oxygen Induction Type: IV induction Ventilation: Mask ventilation without difficulty Laryngoscope Size: Glidescope and 3 Grade View: Grade I Tube type: Oral Tube size: 7.0 mm Number of attempts: 1 Airway Equipment and Method: Stylet and Oral airway Placement Confirmation: ETT inserted through vocal cords under direct vision, positive ETCO2 and breath sounds checked- equal and bilateral Secured at: 23 cm Tube secured with: Tape Dental Injury: Teeth and Oropharynx as per pre-operative assessment  Difficulty Due To: Difficulty was anticipated

## 2023-09-04 NOTE — H&P (Signed)
 PREOPERATIVE H&P  Chief Complaint: Left ankle pain  HPI: Shirley [name] is a 33 y.o. female who presents for preoperative history and physical with a diagnosis of left medial malleolus fracture with intra-articular hemarthrosis.  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  She is here today for surgery.  She has been doing her best to maintain nonweightbearing. Symptoms are rated as moderate to severe, and have been worsening.  This is significantly impairing activities of daily living.  She has elected for surgical management.   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   Depression    Disorder of hip joint 02/16/2018   Edema, lower extremity    Elevated alkaline phosphatase level 10/05/2019   GERD (gastroesophageal reflux disease)    Hypertension    Hypertensive emergency 07/03/2023   Joint pain    Low HDL (under 40) 10/05/2019   Neck pain 07/16/2019   Obesity    Osteoarthritis    Pre-eclampsia, antepartum 07/09/2023   Rheumatoid arthritis (HCC)    Sciatica    Shortness of breath    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   MRI (06/11/2016)  T11-T12: degenerative disc bulge and facet hypertrophy w/o significant stenosis  L4-L5: mild b/l facet arthrosis  L5-S1: mild b/l facet arthrosis, sm central disc protrusion w/o significant stenosis or frank neural impingement and mod b/l foraminal narrowing at L5-S1 related to disc bulge and facet disease, right worse than left     Vitamin D deficiency 10/05/2019   Past Surgical History:  Procedure Laterality Date   CESAREAN SECTION     CESAREAN SECTION WITH BILATERAL TUBAL LIGATION  N/A 07/07/2023   Procedure: CESAREAN SECTION WITH BILATERAL TUBAL LIGATION;  Surgeon: Levie Heritage, DO;  Location: [facility];  Service: Obstetrics;  Laterality: N/A;  Bilateral Tubal ligation   TOOTH EXTRACTION N/A 10/22/2013   Procedure: DENTAL EXTRACTIONS TEETH #1, 16, 17, 32;  Surgeon: Georgia Lopes, DDS;  Location: [facility] OR;  Service: Oral Surgery;  Laterality: N/A;   TUBAL LIGATION  07/07/2023   Procedure: BILATERAL TUBAL LIGATION;  Surgeon: Levie Heritage, DO;  Location: [facility];  Service: Obstetrics;;   Social History   Socioeconomic History   Marital status: Single    Spouse name: Not on file   Number of children: 3   Years of education: Not on file   Highest education level: Not on file  Occupational History   Occupation: stay at home mom  Tobacco Use   Smoking status: Former    Current packs/day: 0.00    Types: Cigarettes    Quit date: 01/29/2017    Years since quitting: 6.6   Smokeless tobacco: Never  Vaping Use   Vaping status: Never Used  Substance and Sexual Activity   Alcohol use: No    Alcohol/week: 0.0 standard drinks of alcohol    Comment: rare   Drug use: No    Types: Marijuana    Comment: not since confirmed pregnancy   Sexual activity: Yes    Birth control/protection: None  Other Topics Concern   Not on file  Social History Narrative   ** Merged  History Encounter **       Social Drivers of Corporate investment banker Strain: Not on file  Food Insecurity: No Food Insecurity (07/06/2023)   Hunger Vital Sign    Worried About Running Out of Food in the Last Year: Never true    Ran Out of Food in the Last Year: Never true  Recent Concern: Food Insecurity - Food Insecurity Present (07/03/2023)   Hunger Vital Sign    Worried About Running Out of Food in the Last Year: Never true    Ran Out of Food in the Last Year: Sometimes true  Transportation Needs: No Transportation Needs (07/06/2023)   PRAPARE - Administrator, Civil Service (Medical):  No    Lack of Transportation (Non-Medical): No  Recent Concern: Transportation Needs - Unmet Transportation Needs (07/03/2023)   PRAPARE - Administrator, Civil Service (Medical): Yes    Lack of Transportation (Non-Medical): Yes  Physical Activity: Not on file  Stress: Not on file  Social Connections: Not on file   Family History  Problem Relation Age of Onset   Hypertension Mother    Depression Mother    Anxiety disorder Mother    Obesity Mother    Hypertension Father    Diabetes Maternal Grandmother    Diabetes Paternal Grandmother    Stroke Paternal Grandmother    No Known Allergies Prior to Admission medications   Medication Sig Start Date End Date Taking? Authorizing Provider  acetaminophen (TYLENOL) 500 MG tablet Take 2 tablets (1,000 mg total) by mouth every 8 (eight) hours. Patient taking differently: Take 1,000 mg by mouth every 8 (eight) hours as needed for mild pain (pain score 1-3), moderate pain (pain score 4-6) or headache. 08/28/23 09/27/23 Yes Kommor, Madison, MD  carvedilol (COREG) 25 MG tablet Take 1 tablet (25 mg total) by mouth 2 (two) times daily with a meal. 09/03/23  Yes Juanda Crumble K, PA-C  gabapentin (NEURONTIN) 400 MG capsule Take 400 mg by mouth 3 (three) times daily.   Yes [provider]  naproxen (NAPROSYN) 375 MG tablet Take 1 tablet (375 mg total) by mouth 2 (two) times daily. 08/28/23  Yes Kommor, Madison, MD  NIFEdipine (ADALAT CC) 60 MG 24 hr tablet Take 1 tablet (60 mg total) by mouth 2 (two) times daily. 09/03/23  Yes Cannon Kettle, PA-C  oxyCODONE 10 MG TABS Take 1 tablet (10 mg total) by mouth every 6 (six) hours as needed for up to 10 doses for breakthrough pain. 08/28/23  Yes Kommor, Madison, MD  Blood Pressure Monitoring DEVI 1 each by Does not apply route once a week. 03/10/23   Milas Hock, MD  furosemide (LASIX) 20 MG tablet Take 1 tablet (20 mg total) by mouth daily. Patient not taking: Reported on 09/03/2023 07/11/23    Constant, Peggy, MD  ibuprofen (ADVIL) 600 MG tablet Take 1 tablet (600 mg total) by mouth every 6 (six) hours as needed for cramping, headache or fever. Patient not taking: Reported on 09/03/2023 07/10/23   Constant, Peggy, MD     Positive ROS: All other systems have been reviewed and were otherwise negative with the exception of those mentioned in the HPI and as above.  Physical Exam:  Vitals:   09/04/23 0937  BP: 137/75  Pulse: 69  Resp: 18  Temp: 98.1 F (36.7 C)  SpO2: 98%   General: Alert, no acute distress Cardiovascular: No pedal edema Respiratory: No cyanosis, no use of accessory musculature  GI: No organomegaly, abdomen is soft and non-tender Skin: No lesions in the area of chief complaint Neurologic: Sensation intact distally Psychiatric: Patient is competent for consent with normal mood and affect Lymphatic: No axillary or cervical lymphadenopathy  MUSCULOSKELETAL: Left ankle in a short leg splint.  Exposed forefoot is swollen and tender.  Some hypersensitivity to light touch.  No tenderness proximal to the splint.  She is able to wiggle toes.    Assessment: Left ankle medial malleolus fracture that is displaced with hemarthrosis and likely intra-articular pathology. First metatarsal base avulsion with possible first TMT instability.   Plan: Plan for left ankle arthroscopic debridement to remove the hemarthrosis and to explore the joint for any intra-articular pathology due to her fracture.  She also has a medial malleolus fracture that will require open reduction internal fixation.  We will also plan for closed treatment of her first metatarsal fracture and possibly need open stabilization of her first TMT joint if deemed unstable during surgery.  We discussed the risks, benefits and alternatives of surgery which include but are not limited to wound healing complications, infection, nonunion, malunion, need for further surgery, damage to surrounding structures and  continued pain.  They understand there is no guarantees to an acceptable outcome.  After weighing these risks they opted to proceed with surgery.     Terance Hart, MD    09/04/2023 10:28 AM

## 2023-09-04 NOTE — Transfer of Care (Signed)
 Immediate Anesthesia Transfer of Care Note  Patient: Shirley [name]  Procedure(s) Performed: OPEN REDUCTION INTERNAL FIXATION (ORIF) MEDIAL MALLEOLUS FRACTURE, CLOSED TREATMENT OF FIRST METATARSAL BASE FRACTURE. OPEN TREATMENT INTERNAL FIXATION OF FIRST TARSOMETATARSAL JOINT (Left: Ankle) ARTHROSCOPY, ANKLE WITH DEBRIDEMENT (Left)  Patient Location: PACU  Anesthesia Type:GA combined with regional for post-op pain  Level of Consciousness: awake, sedated, and drowsy  Airway & Oxygen Therapy: Patient Spontanous Breathing and Patient connected to face mask oxygen  Post-op Assessment: Report given to RN and Post -op Vital signs reviewed and stable  Post vital signs: Reviewed and stable  Last Vitals:  Vitals Value Taken Time  BP 100/66 09/04/23 1330  Temp 36.2 C 09/04/23 1319  Pulse 64 09/04/23 1336  Resp 15 09/04/23 1337  SpO2 100 % 09/04/23 1336  Vitals shown include unfiled device data.  Last Pain:  Vitals:   09/04/23 1319  TempSrc:   PainSc: 0-No pain         Complications:  Encounter Notable Events  Notable Event Outcome Phase Comment  Difficult to intubate - expected  Intraprocedure Filed from anesthesia note documentation.

## 2023-09-04 NOTE — Anesthesia Procedure Notes (Signed)
 Anesthesia Regional Block: Adductor canal block   Pre-Anesthetic Checklist: , timeout performed,  Correct Patient, Correct Site, Correct Laterality,  Correct Procedure, Correct Position, site marked,  Risks and benefits discussed,  Surgical consent,  Pre-op evaluation,  At surgeon's request and post-op pain management  Laterality: Left  Prep: chloraprep       Needles:  Injection technique: Single-shot  Needle Type: Echogenic Stimulator Needle     Needle Length: 5cm  Needle Gauge: 22     Additional Needles:   Procedures:, nerve stimulator,,, ultrasound used (permanent image in chart),,    Narrative:  Start time: 09/04/2023 10:53 AM End time: 09/04/2023 10:56 AM Injection made incrementally with aspirations every 5 mL.  Performed by: Personally  Anesthesiologist: Bethena Midget, MD  Additional Notes: Functioning IV was confirmed and monitors were applied.  A 50mm 22ga Arrow echogenic stimulator needle was used. Sterile prep and drape,hand hygiene and sterile gloves were used. Ultrasound guidance: relevant anatomy identified, needle position confirmed, local anesthetic spread visualized around nerve(s)., vascular puncture avoided.  Image printed for medical record. Negative aspiration and negative test dose prior to incremental administration of local anesthetic. The patient tolerated the procedure well.

## 2023-09-04 NOTE — Discharge Instructions (Signed)
 DR. Susa Simmonds FOOT [facility] ANKLE SURGERY POST-OP INSTRUCTIONS **Oxycodone for post op pain was sent to your pharmacy yesterday   Pain Management The numbing medicine and your leg will last around 18 hours, take a dose of your pain medicine as soon as you feel it wearing off to avoid rebound pain. Keep your foot elevated above heart level.  Make sure that your heel hangs free ('floats'). Take all prescribed medication as directed. If taking narcotic pain medication you may want to use an over-the-counter stool softener to avoid constipation. You may take over-the-counter NSAIDs (ibuprofen, naproxen, etc.) as well as over-the-counter acetaminophen as directed on the packaging as a supplement for your pain and may also use it to wean away from the prescription medication.  Activity Non-weightbearing Keep splint intact  First Postoperative Visit Your first postop visit will be at least 2 weeks after surgery.  This should be scheduled when you schedule surgery. If you do not have a postoperative visit scheduled please call [phone] to schedule an appointment. At the appointment your incision will be evaluated for suture removal, x-rays will be obtained if necessary.  General Instructions Swelling is very common after foot and ankle surgery.  It often takes 3 months for the foot and ankle to begin to feel comfortable.  Some amount of swelling will persist for 6-12 months. DO NOT change the dressing.  If there is a problem with the dressing (too tight, loose, gets wet, etc.) please contact Dr. Donnie Mesa office. DO NOT get the dressing wet.  For showers you can use an over-the-counter cast cover or wrap a washcloth around the top of your dressing and then cover it with a plastic bag and tape it to your leg. DO NOT soak the incision (no tubs, pools, bath, etc.) until you have approval from Dr. Susa Simmonds.  Contact Dr. Garret Reddish office or go to Emergency Room if: Temperature above 101 F. Increasing pain that is  unresponsive to pain medication or elevation Excessive redness or swelling in your foot Dressing problems - excessive bloody drainage, looseness or tightness, or if dressing gets wet Develop pain, swelling, warmth, or discoloration of your calf

## 2023-09-04 NOTE — Anesthesia Procedure Notes (Signed)
 Anesthesia Regional Block: Popliteal block   Pre-Anesthetic Checklist: , timeout performed,  Correct Patient, Correct Site, Correct Laterality,  Correct Procedure, Correct Position, site marked,  Risks and benefits discussed,  Surgical consent,  Pre-op evaluation,  At surgeon's request and post-op pain management  Laterality: Left  Prep: chloraprep       Needles:  Injection technique: Single-shot  Needle Type: Echogenic Stimulator Needle     Needle Length: 5cm  Needle Gauge: 22     Additional Needles:   Procedures:, nerve stimulator,,, ultrasound used (permanent image in chart),,     Nerve Stimulator or Paresthesia:  Response: foot, 0.45 mA  Additional Responses:   Narrative:  Start time: 09/04/2023 10:50 AM End time: 09/04/2023 10:53 AM Injection made incrementally with aspirations every 5 mL.  Performed by: Personally  Anesthesiologist: Bethena Midget, MD  Additional Notes: Functioning IV was confirmed and monitors were applied.  A 50mm 22ga Arrow echogenic stimulator needle was used. Sterile prep and drape,hand hygiene and sterile gloves were used. Ultrasound guidance: relevant anatomy identified, needle position confirmed, local anesthetic spread visualized around nerve(s)., vascular puncture avoided.  Image printed for medical record. Negative aspiration and negative test dose prior to incremental administration of local anesthetic. The patient tolerated the procedure well.

## 2023-09-05 ENCOUNTER — Encounter (HOSPITAL_COMMUNITY): Payer: Self-pay | Admitting: Orthopaedic Surgery

## 2023-09-10 NOTE — Op Note (Signed)
 Shirley [name] female 33 y.o. 09/04/2023  PreOperative Diagnosis: Left ankle hemarthrosis and impingement Left medial malleolus fracture Left first metatarsal base fracture  PostOperative Diagnosis: same  PROCEDURE: Left ankle arthroscopic debridement, extensive Open treatment left medial malleolus fracture Closed treatment first metatarsal base fracture  SURGEON: Dub Mikes, MD  ASSISTANT: to see [place], PA-C was necessary for patient positioning, prep, drape and assistance with fracture reduction and placement of hardware  ANESTHESIA: General LMA with peripheral nerve block  FINDINGS: See below  IMPLANTS: 4.0 mm cannulated screws, Arthrex  INDICATIONS:32 y.o. femalesustained the above injury.  She had displaced medial malleolus fracture and there was concern for intra-articular pathology given the nature of the fracture.  She also was noted to have a fracture of the base of her first metatarsal and there is concern for first TMT instability.  Given her displaced fractures and concern for instability and intra-articular pathology she was indicated for surgery.   Patient understood the risks, benefits and alternatives to surgery which include but are not limited to wound healing complications, infection, nonunion, malunion, need for further surgery as well as damage to surrounding structures. They also understood the potential for continued pain in that there were no guarantees of acceptable outcome After weighing these risks the patient opted to proceed with surgery.  PROCEDURE: Patient was identified in the preoperative holding area.  The left leg was marked by myself.  Consent was signed by myself and the patient.  Block was performed by anesthesia in the preoperative holding area.  Patient was taken to the operative suite and placed supine on the operative table.  General LMA anesthesia was induced without difficulty. Bump was placed under the operative hip and bone foam  was used.  All bony prominences were well padded.  Tourniquet was placed on the operative thigh.  Preoperative antibiotics were given. The extremity was prepped and draped in the usual sterile fashion and surgical timeout was performed.  The limb was elevated and the tourniquet was inflated to 250 mmHg.  We began by insufflating the joint with normal saline using an 18-gauge needle.  We then proceeded by making an anteromedial portal to the ankle joint.  This was done with an 11 blade through the skin.  Then blunt dissection was used with a hemostat down to the capsule.  Then the capsule was violated and the joint entered.  Then the trocar with the camera was placed.  There was a large amount of hemarthrosis and frayed ligamentous and capsular tissue.  There was cartilage damage noted within the joint in the area of the fracture and within the area of the lateral and medial talus.  Then a lateral portal was placed in a similar fashion.  The probe was placed to remove the synovitic tissue and the joint surfaces were inspected.   Then the probe was removed and the shaver was inserted.  Extensive debridement was done of the ankle joint including synovial tissue, hemarthrosis, exposed cancellus bony surfaces and cartilage and evidence of chondral impingement on the anterior distal tibial plafond.    The joint surfaces were inspected for any evidence of loose or full-thickness cartilage damage.  There was some remaining chondrosis notably within the lateral gutter and of the lateral anterior tibial plafond.  There is also some synovitis within the syndesmosis that was debrided back. The fracture was identifiable along the medial malleolus.  The loose bony fragments and cartilage fragments from that area were removed using the shaver. This was debrided  extensively. This completed the arthroscopic debridement portion of the case.   We then turned our attention to the medial malleolus fracture.  An incision was made  overlying the medial malleolus at the site of the fracture.  She did have rather significant eschar and abrasions from her injury but we are able to make the incision outside of these areas of skin damage.  Incision was carried sharply through skin and subcutaneous tissue were able to identify the fracture site.  Fracture was then mobilized and cleared using a round to her and a curette.  Then the fracture was reduced under direct visualization and held provisionally with a pointed reduction clamp.  2 K wires were placed across the fracture and 4.0 millimeters screws were placed after confirmation of appropriate fracture reduction and placement of the screws.  After the screws were placed we checked stability of the ankle and it was stable.  We then turned attention the first metatarsal base.  The fracture was well reduced and not amenable to internal fixation.  Plan was made for closed treatment of the fracture.  We also stressed the first tarsometatarsal joint fluoroscopically and found it to be stable.  The leg was cleaned and the wounds were covered with Xeroform and a soft dressing.   A nonweightbearing short leg splint was placed.  They were awakened from anesthesia and taken recovery in stable condition.  All counts were correct at the end the case.  There was no complications.   POST OPERATIVE INSTRUCTIONS: Keep splint dry and in place Nonweightbearing to right lower extremity Call the office with concerns Follow-up in 2 weeks for splint removal, suture removal and likely placement of a walking boot. Aspirin for DVT prophylaxis  TOURNIQUET TIME:less than 2 hours  BLOOD LOSS:  Minimal         DRAINS: none         SPECIMEN: none       COMPLICATIONS:  (1) Difficult to intubate - expected  Comments: Filed from anesthesia note documentation.         Disposition: PACU - hemodynamically stable.         Condition: stable

## 2023-09-19 DIAGNOSIS — M25572 Pain in left ankle and joints of left foot: Secondary | ICD-10-CM | POA: Diagnosis not present

## 2023-09-19 DIAGNOSIS — S82892A Other fracture of left lower leg, initial encounter for closed fracture: Secondary | ICD-10-CM | POA: Diagnosis not present

## 2023-09-19 DIAGNOSIS — Z9889 Other specified postprocedural states: Secondary | ICD-10-CM | POA: Diagnosis not present

## 2023-09-29 ENCOUNTER — Encounter: Admitting: Neurology

## 2023-09-29 ENCOUNTER — Encounter: Payer: Self-pay | Admitting: Neurology

## 2023-10-06 ENCOUNTER — Ambulatory Visit: Admitting: Family Medicine

## 2023-10-06 ENCOUNTER — Ambulatory Visit: Admitting: Student
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
## 2023-10-06 NOTE — Progress Notes (Deleted)

## 2023-10-07 ENCOUNTER — Ambulatory Visit: Payer: Medicaid Other | Admitting: Nurse Practitioner

## 2023-10-07 DIAGNOSIS — O99412 Diseases of the circulatory system complicating pregnancy, second trimester: Secondary | ICD-10-CM

## 2023-10-07 DIAGNOSIS — D508 Other iron deficiency anemias: Secondary | ICD-10-CM

## 2023-10-07 DIAGNOSIS — I340 Nonrheumatic mitral (valve) insufficiency: Secondary | ICD-10-CM

## 2023-10-07 DIAGNOSIS — I10 Essential (primary) hypertension: Secondary | ICD-10-CM
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
## 2023-10-08 ENCOUNTER — Ambulatory Visit (HOSPITAL_COMMUNITY): Payer: Medicaid Other | Admitting: Psychiatry

## 2023-10-08 ENCOUNTER — Encounter (HOSPITAL_COMMUNITY): Payer: Self-pay | Admitting: Psychiatry

## 2023-10-08 DIAGNOSIS — F329 Major depressive disorder, single episode, unspecified: Secondary | ICD-10-CM

## 2023-10-08 DIAGNOSIS — F4310 Post-traumatic stress disorder, unspecified: Secondary | ICD-10-CM | POA: Diagnosis not present

## 2023-10-08 DIAGNOSIS — F331 Major depressive disorder, recurrent, moderate: Secondary | ICD-10-CM

## 2023-10-08 DIAGNOSIS — F411 Generalized anxiety disorder: Secondary | ICD-10-CM

## 2023-10-08 MED ORDER — PRAZOSIN HCL 1 MG PO CAPS: 1.0000 mg | ORAL_CAPSULE | Freq: Every day | ORAL | 2 refills | Status: DC

## 2023-10-08 MED ORDER — SERTRALINE HCL 50 MG PO TABS: ORAL_TABLET | ORAL | 2 refills | Status: DC

## 2023-10-08 NOTE — Patient Instructions (Signed)
 Thank you for attending your appointment today.  -- START Zoloft 25 mg daily for 1 week then INCREASE to 50 mg daily -- START Prazosin 1 mg nightly -- Continue other medications as prescribed.  Please do not make any changes to medications without first discussing with your provider. If you are experiencing a psychiatric emergency, please call 911 or present to your nearest emergency department. Additional crisis, medication management, and therapy resources are included below.  [facility]  [address], [place], [place] [address] [phone] WALK-IN URGENT CARE 24/7 FOR ANYONE [address], [place], [place]  [phone] Fax: [phone] [email or web address] *Interpreters available *Accepts all insurance and uninsured for Urgent Care needs *Accepts Medicaid and uninsured for outpatient treatment (below)      ONLY FOR [facility]  Below:    Outpatient New Patient Assessment/Therapy Walk-ins:        Monday, Wednesday, and Thursday 8am until slots are full (first come, first served)                   New Patient Psychiatry/Medication Management        Monday-Friday 8am-11am (first come, first served)               For all walk-ins we ask that you arrive by 7:15am, because patients will be seen in the order of arrival.

## 2023-10-22 DIAGNOSIS — M25572 Pain in left ankle and joints of left foot: Secondary | ICD-10-CM | POA: Diagnosis not present

## 2023-10-22 DIAGNOSIS — S8252XA Displaced fracture of medial malleolus of left tibia, initial encounter for closed fracture: Secondary | ICD-10-CM | POA: Diagnosis not present

## 2023-10-28 ENCOUNTER — Encounter (HOSPITAL_COMMUNITY): Payer: Self-pay

## 2023-10-28 ENCOUNTER — Ambulatory Visit: Admitting: Neurology

## 2023-10-28 DIAGNOSIS — R202 Paresthesia of skin: Secondary | ICD-10-CM

## 2023-10-28 NOTE — Procedures (Signed)
 [facility]  [address], [address]  [place], [place] [address] Tel: [phone] Fax: [phone] Test Date:  10/28/2023  Patient: Shirley [name] DOB: 1991/07/01 Physician: Rommie Coats, MD  Sex: Female Height: 5\' 5"  Ref Phys: Ludwig Safer, PA-C  ID#: [id number]   Technician:    History: This is a 33 year old female with numbness and tingling in her arms.  NCV & EMG Findings: Extensive electrodiagnostic evaluation of bilateral upper limbs shows: Bilateral median, ulnar, radial, and median-palmar sensory responses are within normal limits. Bilateral median (APB) and ulnar (ADM) motor responses are within normal limits. There is no evidence of active or chronic motor axon loss changes affecting any of the tested muscles on needle examination. Motor unit configuration and recruitment pattern is within normal limits.  Impression: This is a normal study. Specifically: No electrodiagnostic evidence of right or left cervical (C5-C8) motor radiculopathy. No electrodiagnostic evidence of a right or left median mononeuropathy at or distal to the wrist (ie: carpal tunnel syndrome). Screening studies for right or left ulnar or radial mononeuropathies are normal.    ___________________________ Rommie Coats, MD    Nerve Conduction Studies Motor Nerve Results    Latency Amplitude F-Lat Segment Distance CV Comment  Site (ms) Norm (mV) Norm (ms)  (cm) (m/s) Norm   Left Median (APB) Motor  Wrist 2.7  < 3.9 6.5  > 6.0        Elbow 6.9 - 6.4 -  Elbow-Wrist 24 57  > 50   Right Median (APB) Motor  Wrist 2.7  < 3.9 8.8  > 6.0        Elbow 7.2 - 8.8 -  Elbow-Wrist 25 56  > 50   Left Ulnar (ADM) Motor  Wrist 1.98  < 3.1 9.3  > 7.0        Bel elbow 5.5 - 8.9 -  Bel elbow-Wrist 20.5 59  > 50   Ab elbow 7.1 - 8.3 -  Ab elbow-Bel elbow 10 63 -   Right Ulnar (ADM) Motor  Wrist 1.83  < 3.1 9.0  > 7.0        Bel elbow 5.4 - 8.1 -  Bel elbow-Wrist 21.5 60  > 50   Ab elbow 7.2 -  7.6 -  Ab elbow-Bel elbow 10 56 -    Sensory Sites    Neg Peak Lat Amplitude (O-P) Segment Distance Velocity Comment  Site (ms) Norm (V) Norm  (cm) (ms)   Left Median Sensory  Wrist-Dig II 2.7  < 3.4 36  > 20 Wrist-Dig II 13    Right Median Sensory  Wrist-Dig II 3.1  < 3.4 32  > 20 Wrist-Dig II 13    Left Median-Ulnar Palmar Sensory       Median  Palm-Wrist 1.65  < 2.2 56  > 10 Palm-Wrist 8         Ulnar  Palm-Wrist 1.70  < 2.2 13  > 5 Palm-Wrist 8    Right Median-Ulnar Palmar Sensory       Median  Palm-Wrist 1.88  < 2.2 69  > 10 Palm-Wrist 8         Ulnar  Palm-Wrist 1.88  < 2.2 25  > 5 Palm-Wrist 8    Left Radial Sensory  Forearm-Wrist 2.0  < 2.7 36  > 18 Forearm-Wrist 10    Right Radial Sensory  Forearm-Wrist 2.1  < 2.7 24  > 18 Forearm-Wrist 10    Left Ulnar Sensory  Wrist-Dig V 2.7  < 3.1 25  > 12 Wrist-Dig V 11    Right Ulnar Sensory  Wrist-Dig V 2.7  < 3.1 22  > 12 Wrist-Dig V 11     Inter-Nerve Comparisons   Nerve 1 Value 1 Nerve 2 Value 2 Parameter Result Normal  Sensory Sites  R Median Palm-Wrist 1.88 ms R Ulnar Palm-Wrist 1.88 ms Peak Lat Diff 0 ms <0.40  L Median Palm-Wrist 1.65 ms L Ulnar Palm-Wrist 1.70 ms Peak Lat Diff 0.05 ms <0.40   Electromyography   Side Muscle Ins.Act Fibs Fasc Recrt Amp Dur Poly Activation Comment  Right FDI Nml Nml Nml Nml Nml Nml Nml Nml N/A  Right EIP Nml Nml Nml Nml Nml Nml Nml Nml N/A  Right Pronator teres Nml Nml Nml Nml Nml Nml Nml Nml N/A  Right Biceps Nml Nml Nml Nml Nml Nml Nml Nml N/A  Right Triceps Nml Nml Nml Nml Nml Nml Nml Nml N/A  Right Deltoid Nml Nml Nml Nml Nml Nml Nml Nml N/A  Left FDI Nml Nml Nml Nml Nml Nml Nml Nml N/A  Left Pronator teres Nml Nml Nml Nml Nml Nml Nml Nml N/A  Left Biceps Nml Nml Nml Nml Nml Nml Nml Nml N/A  Left Triceps Nml Nml Nml Nml Nml Nml Nml Nml N/A  Left Deltoid Nml Nml Nml Nml Nml Nml Nml Nml N/A      Waveforms:  Motor           Sensory

## 2023-10-29 ENCOUNTER — Ambulatory Visit: Admitting: Student
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
## 2023-10-30 ENCOUNTER — Ambulatory Visit (HOSPITAL_COMMUNITY): Payer: Medicaid Other | Admitting: Clinical

## 2023-10-30 ENCOUNTER — Ambulatory Visit: Attending: Nurse Practitioner | Admitting: Nurse Practitioner

## 2023-10-30 DIAGNOSIS — I5030 Unspecified diastolic (congestive) heart failure: Secondary | ICD-10-CM

## 2023-10-30 DIAGNOSIS — I10 Essential (primary) hypertension: Secondary | ICD-10-CM

## 2023-10-30 DIAGNOSIS — I340 Nonrheumatic mitral (valve) insufficiency: Secondary | ICD-10-CM

## 2023-10-30 DIAGNOSIS — I429 Cardiomyopathy, unspecified: Secondary | ICD-10-CM

## 2023-10-30 DIAGNOSIS — D508 Other iron deficiency anemias: Secondary | ICD-10-CM
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
## 2023-10-31 ENCOUNTER — Encounter: Payer: Self-pay | Admitting: Student

## 2023-10-31 ENCOUNTER — Ambulatory Visit (INDEPENDENT_AMBULATORY_CARE_PROVIDER_SITE_OTHER): Admitting: Student

## 2023-10-31 VITALS — BP 138/82 | HR 77 | Ht 65.0 in | Wt 309.0 lb

## 2023-10-31 DIAGNOSIS — F329 Major depressive disorder, single episode, unspecified: Secondary | ICD-10-CM | POA: Diagnosis not present

## 2023-10-31 DIAGNOSIS — Z6841 Body Mass Index (BMI) 40.0 and over, adult: Secondary | ICD-10-CM | POA: Diagnosis not present

## 2023-10-31 DIAGNOSIS — M549 Dorsalgia, unspecified: Secondary | ICD-10-CM | POA: Diagnosis present

## 2023-10-31 DIAGNOSIS — I10 Essential (primary) hypertension: Secondary | ICD-10-CM

## 2023-10-31 DIAGNOSIS — R7309 Other abnormal glucose: Secondary | ICD-10-CM

## 2023-10-31 DIAGNOSIS — G8929 Other chronic pain: Secondary | ICD-10-CM

## 2023-10-31 DIAGNOSIS — E66813 Obesity, class 3: Secondary | ICD-10-CM

## 2023-10-31 LAB — POCT GLYCOSYLATED HEMOGLOBIN (HGB A1C): Hemoglobin A1C: 5.5 % (ref 4.0–5.6)

## 2023-10-31 MED ORDER — SEMAGLUTIDE-WEIGHT MANAGEMENT 0.25 MG/0.5ML ~~LOC~~ SOAJ: 0.2500 mg | SUBCUTANEOUS | 0 refills | Status: AC

## 2023-10-31 NOTE — Assessment & Plan Note (Signed)
 Managed by behavioral health, currently on prazosin  1 mg nightly and Zoloft  50 mg daily.  PHQ-9 score of 11 with negative question 9.  No adjustments at this time.

## 2023-10-31 NOTE — Assessment & Plan Note (Signed)
 Prediabetic A1c in past, obtain A1c today.

## 2023-10-31 NOTE — Patient Instructions (Addendum)
 It was great to see you today! Thank you for choosing [facility] for your primary care.  Today we addressed: Return in 3 to 4 weeks for blood pressure medication adjustments. Check for diabetes today, start [facility] weekly. Physical therapy referral for back and hip pain.  If you haven't already, sign up for My Chart to have easy access to your labs results, and communication with your primary care physician.  Return in about 4 weeks (around 11/28/2023) for Weight management and hypertension medication adjustment. Please arrive 15 minutes before your appointment to ensure smooth check in process.  We appreciate your efforts in making this happen.  Thank you for allowing me to participate in your care, Veronia Goon, DO 10/31/2023, 3:47 PM PGY-3, [facility]

## 2023-10-31 NOTE — Assessment & Plan Note (Addendum)
 Phentermine  in past, dedicated to weight change. HTN is contraindication for phentermine  initiation. Exercise is limited at this time due to orthopedic concern.  She would benefit greatly from GLP-1 medication and does not have contraindication. Initiate Wegovy 0.25 mg weekly. Titrate accordingly.

## 2023-10-31 NOTE — Assessment & Plan Note (Addendum)
 BP: 138/82 today. Controlled although could be improved. Goal of <130/80. Continue to work on healthy dietary habits and exercise. Medication regimen: Coreg  25 mg twice daily, nifedipine  60 mg twice daily.  It appears she was on HCTZ in the past.  Follow-up in 4 weeks with PCP for simpler medication regimen adjustment.

## 2023-11-04 ENCOUNTER — Telehealth: Payer: Self-pay

## 2023-11-04 NOTE — Telephone Encounter (Signed)
 Patient calls nurse line in regards to [facility].   She reports the medication is needing a PA.   Will forward to pharmacy team for assistance.

## 2023-11-06 ENCOUNTER — Telehealth: Payer: Self-pay

## 2023-11-06 NOTE — Telephone Encounter (Signed)
 CMM NOT PROCESSING ELECTRONICALLY.   FAXED PAPER PA FORM.

## 2023-11-06 NOTE — Telephone Encounter (Signed)
 Pharmacy Patient Advocate Encounter   Received notification from CoverMyMeds that prior authorization for [facility] 0.25MG  is required/requested.   Insurance verification completed.   The patient is insured through [facility] MEDICAID .   PA required; PA submitted to above mentioned insurance via CoverMyMeds Key/confirmation #/EOC [id number]. Status is pending  Addt'l documentation may be needed, including diet.

## 2023-11-07 NOTE — Telephone Encounter (Signed)
 Pharmacy Patient Advocate Encounter  Received notification from [facility] MEDICAID that Prior Authorization for WEGOVY  has been APPROVED from 11/06/23 to 05/08/24   PA #/Case ID/Reference #: [id number]

## 2023-11-12 ENCOUNTER — Other Ambulatory Visit (HOSPITAL_COMMUNITY): Payer: Self-pay | Admitting: Psychiatry

## 2023-12-01 ENCOUNTER — Ambulatory Visit: Payer: Self-pay | Admitting: Student

## 2023-12-01 NOTE — Progress Notes (Deleted)
    SUBJECTIVE:   CHIEF COMPLAINT / HPI:   ***  PERTINENT  PMH / PSH: HTN, mitral regurg, HFpEF  OBJECTIVE:   There were no vitals taken for this visit. ***  General: NAD, pleasant, able to participate in exam Cardiac: RRR, no murmurs. Respiratory: CTAB, normal effort, No wheezes, rales or rhonchi Abdomen: Bowel sounds present, nontender, nondistended, no hepatosplenomegaly. Extremities: no edema or cyanosis. Skin: warm and dry, no rashes noted Neuro: alert, no obvious focal deficits Psych: Normal affect and mood  ASSESSMENT/PLAN:   No problem-specific Assessment & Plan notes found for this encounter.     Dr. Glenn Lange, DO [facility]    {    This will disappear when note is signed, click to select method of visit    :1}

## 2023-12-05 ENCOUNTER — Ambulatory Visit: Admitting: Family Medicine

## 2023-12-05 ENCOUNTER — Encounter: Payer: Self-pay | Admitting: Family Medicine

## 2023-12-05 VITALS — BP 148/102 | HR 74 | Ht 65.0 in | Wt 304.0 lb

## 2023-12-05 DIAGNOSIS — E66813 Obesity, class 3: Secondary | ICD-10-CM | POA: Diagnosis present

## 2023-12-05 DIAGNOSIS — I10 Essential (primary) hypertension: Secondary | ICD-10-CM | POA: Diagnosis not present

## 2023-12-05 DIAGNOSIS — Z6841 Body Mass Index (BMI) 40.0 and over, adult: Secondary | ICD-10-CM

## 2023-12-05 MED ORDER — SEMAGLUTIDE-WEIGHT MANAGEMENT 0.5 MG/0.5ML ~~LOC~~ SOAJ: 0.5000 mg | SUBCUTANEOUS | 0 refills | Status: DC

## 2023-12-05 MED ORDER — OLMESARTAN MEDOXOMIL-HCTZ 40-12.5 MG PO TABS: 1.0000 | ORAL_TABLET | Freq: Every day | ORAL | 0 refills | Status: DC

## 2023-12-05 MED ORDER — OLMESARTAN MEDOXOMIL-HCTZ 20-12.5 MG PO TABS: 1.0000 | ORAL_TABLET | Freq: Every day | ORAL | 0 refills | Status: DC

## 2023-12-05 MED ORDER — SEMAGLUTIDE-WEIGHT MANAGEMENT 2.4 MG/0.75ML ~~LOC~~ SOAJ
2.4000 mg | SUBCUTANEOUS | 0 refills | Status: DC
Start: 2024-03-30 — End: 2024-01-20

## 2023-12-05 MED ORDER — SEMAGLUTIDE-WEIGHT MANAGEMENT 1 MG/0.5ML ~~LOC~~ SOAJ
1.0000 mg | SUBCUTANEOUS | 0 refills | Status: DC
Start: 2024-02-01 — End: 2024-01-20

## 2023-12-05 MED ORDER — SEMAGLUTIDE-WEIGHT MANAGEMENT 1.7 MG/0.75ML ~~LOC~~ SOAJ
1.7000 mg | SUBCUTANEOUS | 0 refills | Status: DC
Start: 2024-03-01 — End: 2024-01-20

## 2023-12-05 NOTE — Progress Notes (Signed)
    SUBJECTIVE:   CHIEF COMPLAINT / HPI:   Patient is here for follow up on weight management and hypertension. Patient saw Dr. Janae Mclean on 5/2 and was started on Wegovy  for weight loss. Her blood pressure was also elevated, patient on coreg  25 mg BID, nifedipine  60mg  BID. A1C at last visit was 5.5. patient is down 5 lbs since last visit   Since then, patient has tolerated Wegovy  well and has been on it for 4 weeks. Reports some initial GI upset but has been tolerating it better. Is not having any decrease in appetite yet.    Patient did not take blood pressure medication today. Denies any symptoms including headache, vision changes.    OBJECTIVE:   BP (!) 148/102   Pulse 74   Ht 5\' 5"  (1.651 m)   Wt (!) 304 lb (137.9 kg)   LMP 11/09/2023   SpO2 98%   BMI 50.59 kg/m   Vitals:   12/05/23 1127 12/05/23 1154  BP: (!) 160/120 (!) 148/102  General: A&O, NAD HEENT: No sign of trauma, EOM grossly intact Cardiac: RRR, 2/6 holosystolic murmur  Respiratory: CTAB, normal WOB, no w/c/r GI: obese, soft, NTND Extremities: Left ankle with wrapping around surgical site, clean and dry.  Neuro: No focal neurological abnormalities noted  Psych: Appropriate mood and affect   ASSESSMENT/PLAN:   Assessment & Plan Class 3 severe obesity with serious comorbidity and body mass index (BMI) of 50.0 to 59.9 in adult Patient is doing well on wegovy . Down 5 lbs since last time. Patient would like to continue. Will increase to 0.5 mg/week and follow up in one months time. Patient is continuing to work on diet and exercise. Started PT this week and is encouraged with 5 lb weight loss.  Essential hypertension BP initially 160/120, decreased on recheck, though still elevated. Suspect BP regimen of nifedipine  and coreg  are from pregnancy. Given she is no longer pregnant, will discontinue this current regimen and consolidate medication. Will start on Benicar HCT 40-12.5 mg. Patient has history of tubal  ligation.  - olmesartan-hydrochlorothiazide  (BENICAR HCT) 40-12.5 MG tablet; Take 1 tablet by mouth daily.      Patient to follow up in one month.   Johnella Naas, MD [facility]

## 2023-12-05 NOTE — Patient Instructions (Addendum)
 It was wonderful to see you today.  Please bring ALL of your medications with you to every visit.   Today we talked about:  Your blood pressure was elevated. Lets switch your medication to the one I am prescribing. Take this medication once daily. Lets follow up in 1 month for your weight loss as well as this new change. Stop taking your other blood pressure medication.   Thank you for choosing [facility].   Please call [phone] with any questions about today's appointment.  Please arrive at least 15 minutes prior to your scheduled appointments.   If you had blood work today, I will send you a MyChart message or a letter if results are normal. Otherwise, I will give you a call.   If you had a referral placed, they will call you to set up an appointment. Please give us  a call if you don't hear back in the next 2 weeks.   If you need additional refills before your next appointment, please call your pharmacy first.   You should follow up in our clinic in Return in about 4 weeks (around 01/02/2024).  Johnella Naas, MD Family Medicine

## 2023-12-05 NOTE — Assessment & Plan Note (Signed)
 BP initially 160/120, decreased on recheck, though still elevated. Suspect BP regimen of nifedipine  and coreg  are from pregnancy. Given she is no longer pregnant, will discontinue this current regimen and consolidate medication. Will start on Benicar HCT 40-12.5 mg. Patient has history of tubal ligation.  - olmesartan-hydrochlorothiazide  (BENICAR HCT) 40-12.5 MG tablet; Take 1 tablet by mouth daily.

## 2023-12-05 NOTE — Assessment & Plan Note (Signed)
 Patient is doing well on wegovy . Down 5 lbs since last time. Patient would like to continue. Will increase to 0.5 mg/week and follow up in one months time. Patient is continuing to work on diet and exercise. Started PT this week and is encouraged with 5 lb weight loss.

## 2023-12-09 ENCOUNTER — Encounter: Payer: Self-pay | Admitting: *Deleted

## 2023-12-09 ENCOUNTER — Telehealth: Payer: Self-pay
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
## 2023-12-09 NOTE — Telephone Encounter (Signed)
 The patient precepted with me. RN team received a message from the pharmacy that the patient had dispensed 1.7 mg Wegovy  instead of 0.5 mg Wegovy . The provider ordered a standing titration order of Wegovy . Per the RN, the patient had already injected Wegovy  1.7 mg with some GI symptoms. I recommended calling poison control. Hold Wegovy  for 2 weeks and restart at 0.5 mg as previously recommended. Need to be seen this week by PCP or Dr. Derril Flint or an available provider prior to resuming Wegovy . Alisa App to ensure a follow-up appointment is scheduled ED precautions to be discussed with her.

## 2023-12-09 NOTE — Telephone Encounter (Signed)
 Received call from pharmacy regarding patient being dispensed incorrect dosage of Wegovy .   Patient was dispensed 1.7 mg pen on 12/08/23.  Patient injected 1.7 mg yesterday. She reports 1 episode of emesis last night. She states that she feels fine at this time.   Spoke with Dr. Grandville Lax regarding management. She advised that patient contact poison control regarding increased dosage that was administered and to hold Wegovy  until 6/23. Patient should hold onto 1.7 mg dose and reach out to pharmacy for current 0.5 mg dosage. Patient's next dosage should be of 0.5 mg dose.   Follow up visit has been scheduled with [place]. ED precautions discussed.   Received message from Dr. Grandville Lax asking that I call patient back to schedule follow up visit this week.   Called patient and scheduled for tomorrow with Dr. Mikeal Alder.   Patient reports that she did call poison control and they had no further recommendations.   Elsie Halo, RN

## 2023-12-10 ENCOUNTER — Encounter (HOSPITAL_COMMUNITY): Payer: Self-pay | Admitting: Psychiatry

## 2023-12-10 ENCOUNTER — Ambulatory Visit: Admitting: Student

## 2023-12-10 ENCOUNTER — Telehealth (INDEPENDENT_AMBULATORY_CARE_PROVIDER_SITE_OTHER): Admitting: Psychiatry

## 2023-12-10 DIAGNOSIS — F4310 Post-traumatic stress disorder, unspecified: Secondary | ICD-10-CM | POA: Diagnosis not present

## 2023-12-10 DIAGNOSIS — F411 Generalized anxiety disorder: Secondary | ICD-10-CM | POA: Diagnosis not present

## 2023-12-10 DIAGNOSIS — F331 Major depressive disorder, recurrent, moderate: Secondary | ICD-10-CM

## 2023-12-10 MED ORDER — PRAZOSIN HCL 1 MG PO CAPS: 1.0000 mg | ORAL_CAPSULE | Freq: Every day | ORAL | 2 refills | Status: AC

## 2023-12-10 MED ORDER — SERTRALINE HCL 50 MG PO TABS: 50.0000 mg | ORAL_TABLET | Freq: Every day | ORAL | 2 refills | Status: AC

## 2023-12-10 NOTE — Patient Instructions (Addendum)
 Thank you for attending your appointment today.  -- We did not make any medication changes today. Please continue medications as prescribed. -- Make sure to take your medications every day - it can be helpful to use an alarm or pill box to remind you to take your medications.  Please do not make any changes to medications without first discussing with your provider. If you are experiencing a psychiatric emergency, please call 911 or present to your nearest emergency department. Additional crisis, medication management, and therapy resources are included below.  [facility]  [address], [place], [place] [address] [phone] WALK-IN URGENT CARE 24/7 FOR ANYONE [address], [place], [place]  [phone] Fax: [phone] [email or web address] *Interpreters available *Accepts all insurance and uninsured for Urgent Care needs *Accepts Medicaid and uninsured for outpatient treatment (below)      ONLY FOR [facility]  Below:    Outpatient New Patient Assessment/Therapy Walk-ins:        Monday, Wednesday, and Thursday 8am until slots are full (first come, first served)                   New Patient Psychiatry/Medication Management        Monday-Friday 8am-11am (first come, first served)               For all walk-ins we ask that you arrive by 7:15am, because patients will be seen in the order of arrival.

## 2023-12-11 NOTE — Telephone Encounter (Signed)
 Received message from Dr. Grandville Lax asking that I call pharmacy and cancel titrated doses of Wegovy .   Called and cancelled. They have the 0.5 mg pen on hold for when it is time for patient to pick up.   Elsie Halo, RN

## 2024-01-06 ENCOUNTER — Other Ambulatory Visit: Payer: Self-pay | Admitting: Family Medicine

## 2024-01-06 ENCOUNTER — Ambulatory Visit: Admitting: Family Medicine

## 2024-01-06 VITALS — BP 168/118 | HR 71 | Ht 65.0 in | Wt 298.4 lb

## 2024-01-06 DIAGNOSIS — Z6841 Body Mass Index (BMI) 40.0 and over, adult: Secondary | ICD-10-CM

## 2024-01-06 DIAGNOSIS — F329 Major depressive disorder, single episode, unspecified: Secondary | ICD-10-CM

## 2024-01-06 DIAGNOSIS — E66813 Obesity, class 3: Secondary | ICD-10-CM

## 2024-01-06 DIAGNOSIS — I10 Essential (primary) hypertension: Secondary | ICD-10-CM | POA: Diagnosis not present

## 2024-01-06 DIAGNOSIS — Z599 Problem related to housing and economic circumstances, unspecified: Secondary | ICD-10-CM

## 2024-01-06 NOTE — Assessment & Plan Note (Addendum)
 PHQ-9 13 today. Patient is established with Dr. Mercy with psychiatry.

## 2024-01-06 NOTE — Patient Instructions (Addendum)
 It was wonderful to see you today.  Please bring ALL of your medications with you to every visit.   Today we talked about:  - Your weight loss. Continue the current course of semaglutide . We will follow up in 1 month. - Your blood pressure is still elevated. We won't make any changes today, but will need to follow up sooner. Please return for a blood pressure check in 1 week.   Dr. Elsa: [facility]  Address: [address], [place], [place] [address] Phone: [phone]  To enroll in [facility] Financial Assistance, please call  [phone])  I also sent a referral to another agency that helps with financial strain/ongoing stress. They will be giving you a call.  Here is another resource to help with baby: [email or web address]  Thank you for choosing [facility].   Please call [phone] with any questions about today's appointment.  Please arrive at least 15 minutes prior to your scheduled appointments.   If you had blood work today, I will send you a MyChart message or a letter if results are normal. Otherwise, I will give you a call.   If you had a referral placed, they will call you to set up an appointment. Please give us  a call if you don't hear back in the next 2 weeks.   If you need additional refills before your next appointment, please call your pharmacy first.   Follow up in one week for BP check and in 4 weeks for weight loss.   You should follow up in our clinic in [place] in about 4 weeks (around 02/03/2024) for follow up.  Gloriann Ogren, MD Family Medicine

## 2024-01-06 NOTE — Assessment & Plan Note (Addendum)
 Patient reports no negative side effects after being dispensed wrong dose of semaglutide . Is currently on last week of 0.5 mg dose. No adverse side effects. Feels it is slightly beneficial. Discussed continued lifestyle changes. Provided information regarding healthier habits. Patient to start 1 mg dose next week. Follow up in 1 month.

## 2024-01-06 NOTE — Assessment & Plan Note (Signed)
 BP elevated today. Patient is asymptomatic. Patient to pick up benicar  tomorrow. Will have patient follow up in 1 week for BP check and lab work.

## 2024-01-06 NOTE — Progress Notes (Signed)
    SUBJECTIVE:   CHIEF COMPLAINT / HPI:   Patient is here for follow up for weight management and hypertension. Patient was dispensed the wrong dose of Wegovy  from pharmacy and had one episode of emesis at that time. Since then, has not experienced any other symptoms. Took 0.5 mg yesterday. No side effects at this time.  Has not switched to benicar  40-12.5mg  as prescribed at last visit due to financial constraint. Patient did still have nifedipine  and coreg  and has been taking this. Is able to pick up medication tomorrow. Denies any headaches, blurry vision, chest pain.   Patient reports mood wise she is doing okay. Has had some increased financial stress recently and that along with other life stressors are hitting her. She is following with a psychiatrist and feels those medications are helpful.   Patient reports she had a fall recently and she is experiencing some hip pain. Followed by [facility]. Will call them for appointment.   Flowsheet Row Office Visit from 01/06/2024 in [facility] Dept Of . [facility]  PHQ-9 Total Score 13    PERTINENT  PMH / PSH:  Obesity  Prediabetes MDD with PP  GAD PTSD   OBJECTIVE:   BP (!) 168/118   Pulse 71   Ht 5' 5 (1.651 m)   Wt 298 lb 6.4 oz (135.4 kg)   LMP 01/06/2024   SpO2 100%   BMI 49.66 kg/m   General: A&O, NAD HEENT: No sign of trauma, EOM grossly intact Cardiac: RRR, no m/r/g Respiratory: CTAB, normal WOB, no w/c/r GI: Soft, NTTP, non-distended  Extremities: NTTP, no peripheral edema. Neuro: Normal gait, moves all four extremities appropriately. Psych: Appropriate mood and affect    ASSESSMENT/PLAN:   Assessment & Plan Financial difficulties Patient with increased financial stress. Referral for VBCI placed along with information for [facility] financial assistance.  Essential hypertension BP elevated today. Patient is asymptomatic. Patient to pick up benicar  tomorrow. Will have  patient follow up in 1 week for BP check and lab work.  Major depressive disorder with current active episode, unspecified depression episode severity, unspecified whether recurrent PHQ-9 13 today. Patient is established with Dr. Mercy with psychiatry.  Class 3 severe obesity with serious comorbidity and body mass index (BMI) of 50.0 to 59.9 in adult Patient reports no negative side effects after being dispensed wrong dose of semaglutide . Is currently on last week of 0.5 mg dose. No adverse side effects. Feels it is slightly beneficial. Discussed continued lifestyle changes. Provided information regarding healthier habits. Patient to start 1 mg dose next week. Follow up in 1 month.      Gloriann Ogren, MD [facility]

## 2024-01-19 ENCOUNTER — Other Ambulatory Visit: Payer: Self-pay | Admitting: Family Medicine

## 2024-01-19 DIAGNOSIS — M25572 Pain in left ankle and joints of left foot: Secondary | ICD-10-CM | POA: Diagnosis not present

## 2024-01-19 DIAGNOSIS — M25552 Pain in left hip: Secondary | ICD-10-CM | POA: Diagnosis not present

## 2024-01-19 DIAGNOSIS — E66813 Obesity, class 3: Secondary | ICD-10-CM

## 2024-01-20 ENCOUNTER — Telehealth: Payer: Self-pay

## 2024-01-20 DIAGNOSIS — E66813 Obesity, class 3: Secondary | ICD-10-CM

## 2024-01-20 MED ORDER — SEMAGLUTIDE-WEIGHT MANAGEMENT 1 MG/0.5ML ~~LOC~~ SOAJ: 1.0000 mg | SUBCUTANEOUS | 0 refills | Status: DC

## 2024-01-20 MED ORDER — SEMAGLUTIDE-WEIGHT MANAGEMENT 1.7 MG/0.75ML ~~LOC~~ SOAJ: 1.7000 mg | SUBCUTANEOUS | 0 refills | Status: DC

## 2024-01-20 NOTE — Telephone Encounter (Signed)
 Patient aware.

## 2024-01-20 NOTE — Telephone Encounter (Signed)
 Patient calls nurse line requesting a prescription for Wegovy  1mg .   She reports she has been on 0.5mg  for ~ 4 weeks. She reports confusion surrounding dates, due to pharmacy error earlier this summer. However, she confirms she just completed ~4 weeks of 0.5mg .  It appears 1mg  is set to be filled on 8/3. Patient reports if she waits until 8/3 that will be 2 weeks without an injection.   Advised will forward to provider.

## 2024-01-21 NOTE — Progress Notes (Unsigned)
 Patient did not connect for virtual psychiatric medication management appointment on 01/23/24 at 8:30AM. Sent secure video link with no response. Called phone with no answer; left VM with callback number to reschedule.  LAURAINE DELENA PUMMEL, MD 01/23/24

## 2024-01-23 ENCOUNTER — Encounter (HOSPITAL_COMMUNITY): Payer: Self-pay

## 2024-01-23 ENCOUNTER — Encounter (HOSPITAL_COMMUNITY): Admitting: Psychiatry

## 2024-01-27 ENCOUNTER — Telehealth: Payer: Self-pay | Admitting: *Deleted

## 2024-01-27 NOTE — Progress Notes (Signed)
 Complex Care Management Note  Care Guide Note 01/27/2024 Name: Shirley [name] MRN: [id number] DOB: 11-Oct-1990  Shirley [name] is a 33 y.o. year old female who sees Alba Sharper, MD for primary care. I reached out to Shirley [name] by phone today to offer complex care management services.  [name] was given information about Complex Care Management services today including:   The Complex Care Management services include support from the care team which includes your Nurse Care Manager, Clinical Social Worker, or Pharmacist.  The Complex Care Management team is here to help remove barriers to the health concerns and goals most important to you. Complex Care Management services are voluntary, and the patient may decline or stop services at any time by request to their care team member.   Complex Care Management Consent Status: Patient agreed to services and verbal consent obtained.   Follow up plan:  Telephone appointment with complex care management team member scheduled for:  8/4 with BSW and 8/12 with RNCM   Encounter Outcome:  Patient Scheduled  Harlene Satterfield  [facility]  [facility] [place] [facility] Guide  Direct Dial: [phone]  Fax [phone]

## 2024-02-02 ENCOUNTER — Other Ambulatory Visit: Payer: Self-pay
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
## 2024-02-02 NOTE — Patient Instructions (Signed)
 Visit Information  Thank you for taking time to visit with me today. Please don't hesitate to contact me if I can be of assistance to you before our next scheduled appointment.  Our next appointment is no further scheduled appointments.   Please call the care guide team at [phone] if you need to cancel or reschedule your appointment.    Please call the Suicide and Crisis Lifeline: 988 call the [place]  National Suicide Prevention Lifeline: 6020706364 or TTY: 623-840-8122 TTY 641-082-2735) to talk to a trained counselor call 1-800-273-TALK (toll free, 24 hour hotline) go to [facility] [address] [place] [id number]) call 911 if you are experiencing a Mental Health or Behavioral Health Crisis or need someone to talk to.  Patient verbalizes understanding of instructions and care plan provided today and agrees to view in MyChart. Active MyChart status and patient understanding of how to access instructions and care plan via MyChart confirmed with patient.     Haven Lion, BSW [place]  [facility] Social Worker, [facility] [phone]

## 2024-02-03 ENCOUNTER — Ambulatory Visit (HOSPITAL_COMMUNITY): Attending: Physician Assistant

## 2024-02-03 ENCOUNTER — Ambulatory Visit: Admitting: Family Medicine

## 2024-02-03 NOTE — Progress Notes (Deleted)
    SUBJECTIVE:   CHIEF COMPLAINT / HPI: f/u   ***  Started on Benicar  by Dr. Lonnie 07/08. Did not follow-up for labs.  PERTINENT  PMH / PSH: HTN, Maternal cardiomyopathy, Mitral valve regurgitation, PTSD, MDD, GAD, Hx of tubal ligation.  OBJECTIVE:   LMP 01/06/2024   ***  ASSESSMENT/PLAN:   Assessment & Plan  No follow-ups on file.  Shirley Provencal, MD [facility]

## 2024-02-04 ENCOUNTER — Other Ambulatory Visit: Payer: Self-pay | Admitting: Family Medicine

## 2024-02-04 DIAGNOSIS — I10 Essential (primary) hypertension: Secondary | ICD-10-CM

## 2024-02-10 ENCOUNTER — Other Ambulatory Visit: Payer: Self-pay | Admitting: *Deleted

## 2024-02-10 ENCOUNTER — Other Ambulatory Visit: Payer: Self-pay

## 2024-02-10 VITALS — BP 174/108

## 2024-02-10 DIAGNOSIS — I10 Essential (primary) hypertension: Secondary | ICD-10-CM

## 2024-02-10 DIAGNOSIS — F53 Postpartum depression: Secondary | ICD-10-CM
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
## 2024-02-10 NOTE — Patient Instructions (Signed)
 Visit Information  Thank you for taking time to visit with me today. Please don't hesitate to contact me if I can be of assistance to you before our next scheduled appointment.  Our next appointment is by telephone on 03-12-24 at 11:30 am Please call the care guide team at [phone] if you need to cancel or reschedule your appointment.   Following is a copy of your care plan:   Goals Addressed             This Visit's Progress    VBCI RN Care Plan       Problems:  Chronic Disease Management support and education needs related to HTN  Goal: Over the next 90 days the Patient will attend all scheduled medical appointments: PCP and Specialist  as evidenced by keeping all scheduled appointments.        continue to work with Medical illustrator and/or Social Worker to address care management and care coordination needs related to HTN as evidenced by adherence to care management team scheduled appointments     take all medications exactly as prescribed and will call provider for medication related questions as evidenced by compliance.    verbalize basic understanding of HTN disease process and self health management plan as evidenced by verbal explanation, recognize monitor, symptoms and lifestyle modifications.   work with pharmacist to address Mental Health Concerns  related to HTN as evidenced by review of electronic medical record and patient or pharmacist report    work with social worker to address Mental Health Concerns  related to the management of HTN as evidenced by review of electronic medical record and patient or social worker report          Interventions:   Hypertension Interventions: Last practice recorded BP readings:  BP Readings from Last 3 Encounters:  02/10/24 (!) 174/108  01/06/24 (!) 168/118  12/05/23 (!) 148/102   Most recent eGFR/CrCl:  Lab Results  Component Value Date   EGFR 82 02/13/2023    No components found for: CRCL  Evaluation of current treatment  plan related to hypertension self management and patient's adherence to plan as established by provider Provided education to patient re: stroke prevention, s/s of heart attack and stroke Reviewed medications with patient and discussed importance of compliance Discussed plans with patient for ongoing care management follow up and provided patient with direct contact information for care management team Provided education on prescribed diet Dash diet Discussed complications of poorly controlled blood pressure such as heart disease, stroke, circulatory complications, vision complications, kidney impairment, sexual dysfunction Screening for signs and symptoms of depression related to chronic disease state  Assessed social determinant of health barriers   Patient Self-Care Activities:  Attend all scheduled provider appointments Call pharmacy for medication refills 3-7 days in advance of running out of medications Call provider office for new concerns or questions  Take medications as prescribed   Work with the social worker to address care coordination needs and will continue to work with the clinical team to address health care and disease management related needs Work with the pharmacist to address medication management needs and will continue to work with the clinical team to address health care and disease management related needs Check blood pressure daily Reschedule follow-up appointment with PCP  Plan:  Telephone follow up appointment with care management team member scheduled for:  03-12-24  at 1130 am.              Please call the  Suicide and Crisis Lifeline: 988 call the [place]  National Suicide Prevention Lifeline: (386) 579-7808 or TTY: 540 158 1464 TTY 340-100-8985) to talk to a trained counselor call 1-800-273-TALK (toll free, 24 hour hotline) go to [facility] [address], [place] [phone]) call the [facility] Crisis  Line: (801) 302-8253 call 911 if you are experiencing a Mental Health or Behavioral Health Crisis or need someone to talk to.  Patient verbalizes understanding of instructions and care plan provided today and agrees to view in MyChart. Active MyChart status and patient understanding of how to access instructions and care plan via MyChart confirmed with patient.     Braedyn Riggle, RN, BSN, ACM RN Care Manager [facility] Population Health [phone]    Visit Information  Thank you for taking time to visit with me today. Please don't hesitate to contact me if I can be of assistance to you before our next scheduled appointment.  Our next appointment is by telephone on 03-12-24 at  11:30 am Please call the care guide team at [phone] if you need to cancel or reschedule your appointment.   Following is a copy of your care plan:   Goals Addressed             This Visit's Progress    VBCI RN Care Plan       Problems:  Chronic Disease Management support and education needs related to HTN  Goal: Over the next 90 days the Patient will attend all scheduled medical appointments: PCP and Specialist  as evidenced by keeping all scheduled appointments.        continue to work with Medical illustrator and/or Social Worker to address care management and care coordination needs related to HTN as evidenced by adherence to care management team scheduled appointments     take all medications exactly as prescribed and will call provider for medication related questions as evidenced by compliance.    verbalize basic understanding of HTN disease process and self health management plan as evidenced by verbal explanation, recognize monitor, symptoms and lifestyle modifications.   work with pharmacist to address Mental Health Concerns  related to HTN as evidenced by review of electronic medical record and patient or pharmacist report    work with social worker to address Mental Health Concerns  related to the  management of HTN as evidenced by review of electronic medical record and patient or social worker report          Interventions:   Hypertension Interventions: Last practice recorded BP readings:  BP Readings from Last 3 Encounters:  02/10/24 (!) 174/108  01/06/24 (!) 168/118  12/05/23 (!) 148/102   Most recent eGFR/CrCl:  Lab Results  Component Value Date   EGFR 82 02/13/2023    No components found for: CRCL  Evaluation of current treatment plan related to hypertension self management and patient's adherence to plan as established by provider Provided education to patient re: stroke prevention, s/s of heart attack and stroke Reviewed medications with patient and discussed importance of compliance Discussed plans with patient for ongoing care management follow up and provided patient with direct contact information for care management team Provided education on prescribed diet Dash diet Discussed complications of poorly controlled blood pressure such as heart disease, stroke, circulatory complications, vision complications, kidney impairment, sexual dysfunction Screening for signs and symptoms of depression related to chronic disease state  Assessed social determinant of health barriers   Patient Self-Care Activities:  Attend all scheduled provider appointments Call pharmacy  for medication refills 3-7 days in advance of running out of medications Call provider office for new concerns or questions  Take medications as prescribed   Work with the social worker to address care coordination needs and will continue to work with the clinical team to address health care and disease management related needs Work with the pharmacist to address medication management needs and will continue to work with the clinical team to address health care and disease management related needs Check blood pressure daily Reschedule follow-up appointment with PCP  Plan:  Telephone follow up appointment  with care management team member scheduled for:  03-12-24  at 1130 am.              Please call the Suicide and Crisis Lifeline: 988 call the [place]  National Suicide Prevention Lifeline: 234 657 9869 or TTY: 832-735-1933 TTY (332)154-3811) to talk to a trained counselor call 1-800-273-TALK (toll free, 24 hour hotline) go to [facility] [address], [place] [phone]) call the [facility] Crisis Line: [phone] call 911 if you are experiencing a Mental Health or Behavioral Health Crisis or need someone to talk to.  Patient verbalizes understanding of instructions and care plan provided today and agrees to view in MyChart. Active MyChart status and patient understanding of how to access instructions and care plan via MyChart confirmed with patient.     Mylee Falin, RN, BSN, Theatre manager [employer] [phone]

## 2024-02-10 NOTE — Addendum Note (Signed)
 Addended by: Rafan Sanders A on: 02/10/2024 04:51 PM   Modules accepted: Orders

## 2024-02-13 ENCOUNTER — Other Ambulatory Visit: Payer: Self-pay | Admitting: Family Medicine

## 2024-02-13 DIAGNOSIS — E66813 Obesity, class 3: Secondary | ICD-10-CM

## 2024-02-20 ENCOUNTER — Encounter: Payer: Self-pay | Admitting: Licensed Clinical Social Worker

## 2024-02-20 ENCOUNTER — Telehealth: Payer: Self-pay | Admitting: Licensed Clinical Social Worker

## 2024-02-20 NOTE — Patient Instructions (Signed)
 Shirley [name] - I am sorry I was unable to reach you today for our scheduled appointment. I work with Alba Sharper, MD and am calling to support your healthcare needs. Please contact me at [phone] at your earliest convenience. I look forward to speaking with you soon.   Thank you,  Rolin Kerns, LCSW [place]  [facility], [facility] Clinical Social Worker Direct Dial: [phone]  Fax: [phone] Website: [email or web address] 9:49 AM

## 2024-02-23 ENCOUNTER — Telehealth: Payer: Self-pay | Admitting: Pharmacist

## 2024-02-23 NOTE — Telephone Encounter (Signed)
 Patient contacted for follow-up of SDOH  Since last contact patient reports multiple hardships and pregnancy had made it hard to afford her medications this year. Currently medicaid has made medications more affordable for her at around $4. Not currently having hardships. Reports wanting a stronger medication for her weight loss and mentioned she was previously on phentermine  prior to pregnancy.  Current Medications include: Wegovy  (semaglutide ) 1.7 mg  Consider dose titration to Wegovy  (semaglutide ) 2.4 mg at next visit Patient denies any significant medication related side effects.   Total time with patient call and documentation of interaction: 3 minutes.  Follow-up appointment with Dr. Stoney: 03/09/24 @10 :10 am

## 2024-02-24 NOTE — Telephone Encounter (Signed)
 Reviewed and agree with Dr Rennis plan.

## 2024-02-26 ENCOUNTER — Ambulatory Visit (INDEPENDENT_AMBULATORY_CARE_PROVIDER_SITE_OTHER): Admitting: Clinical

## 2024-02-26 DIAGNOSIS — F331 Major depressive disorder, recurrent, moderate: Secondary | ICD-10-CM

## 2024-02-26 DIAGNOSIS — F4310 Post-traumatic stress disorder, unspecified: Secondary | ICD-10-CM | POA: Diagnosis not present
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
## 2024-03-08 NOTE — Progress Notes (Unsigned)
    SUBJECTIVE:   CHIEF COMPLAINT / HPI:   Weight management Taking 1mg  semaglutide    PERTINENT  PMH / PSH: HTN, mitral valve regurgitation, OA, vit D deficiency, IDA, ptsd, mdd, gad, obesity  OBJECTIVE:   There were no vitals taken for this visit.  ***  ASSESSMENT/PLAN:   Assessment & Plan      Elyce Prescott, DO [place] [facility]

## 2024-03-09 ENCOUNTER — Ambulatory Visit (INDEPENDENT_AMBULATORY_CARE_PROVIDER_SITE_OTHER): Admitting: Family Medicine

## 2024-03-09 VITALS — BP 179/93 | HR 77 | Ht 65.0 in | Wt 286.0 lb

## 2024-03-09 DIAGNOSIS — E66813 Obesity, class 3: Secondary | ICD-10-CM

## 2024-03-09 DIAGNOSIS — I10 Essential (primary) hypertension: Secondary | ICD-10-CM

## 2024-03-09 DIAGNOSIS — Z6841 Body Mass Index (BMI) 40.0 and over, adult: Secondary | ICD-10-CM

## 2024-03-09 MED ORDER — WEGOVY 2.4 MG/0.75ML ~~LOC~~ SOAJ: 2.4000 mg | SUBCUTANEOUS | 0 refills | Status: DC

## 2024-03-09 NOTE — Assessment & Plan Note (Signed)
 Weight 01/06/24 298lb, weight today 286lb (12 lb loss in 2 months). She is on track. No negative side effects currently.  - Increase wegovy  to 2.4mg /week - F/u 1 month

## 2024-03-09 NOTE — Patient Instructions (Signed)
 Good to see you today - Thank you for coming in  Things we discussed today:  I increased your Wegovy  to 2.4 mg weekly Follow-up in 1 month to reassess how you are doing with that dose  Please take your blood pressure medicine

## 2024-03-09 NOTE — Assessment & Plan Note (Signed)
 BP elevated on two readings today. Asymptomatic. Did not take her BP medication this morning. - Advised to take bp meds when she gets home

## 2024-03-12 ENCOUNTER — Other Ambulatory Visit: Payer: Self-pay

## 2024-03-12 ENCOUNTER — Encounter: Payer: Self-pay | Admitting: Cardiology

## 2024-03-12 ENCOUNTER — Other Ambulatory Visit: Payer: Self-pay | Admitting: *Deleted

## 2024-03-12 NOTE — Patient Instructions (Signed)
 Visit Information  Shirley [name] was given information about Medicaid Managed Care team care coordination services as a part of their [facility] Community Plan Medicaid benefit.   If you would like to schedule transportation through your [facility], please call the following number at least 2 days in advance of your appointment: [phone]   Rides for urgent appointments can also be made after hours by calling Member Services.  Call the Behavioral Health Crisis Line at [phone], at any time, 24 hours a day, 7 days a week. If you are in danger or need immediate medical attention call 911.   Shirley [name] - following are the goals we discussed in your visit today:   Goals Addressed   None     Please see education materials related to HTN provided by MyChart link.  Patient verbalizes understanding of instructions and care plan provided today and agrees to view in MyChart. Active MyChart status and patient understanding of how to access instructions and care plan via MyChart confirmed with patient.     Telephone follow up appointment with Managed Medicaid care management team member scheduled for: 04/12/2024 @ 1130 am.    Channie Bostick, RN, BSN, ACM RN Care Manager [facility] [phone]   Following is a copy of your plan of care:  There are no care plans that you recently modified to display for this patient.
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
## 2024-03-15 ENCOUNTER — Telehealth (HOSPITAL_COMMUNITY): Payer: Self-pay | Admitting: Physician Assistant

## 2024-03-15 ENCOUNTER — Ambulatory Visit (HOSPITAL_COMMUNITY): Admission: RE | Admit: 2024-03-15 | Source: Ambulatory Visit

## 2024-03-15 NOTE — Telephone Encounter (Signed)
 Patient NO SHOWED ECHO X 2. No follow up will be made to reschedule patient due to NO SHOW rate. Order will be removed from the echo WQ. Thank you.

## 2024-03-16 NOTE — Telephone Encounter (Signed)
 Called and spoke with patient. Scheduled patient for 6 month follow up with Dr. Tolia for 03/29/24 at 1:00 pm.

## 2024-03-16 NOTE — Telephone Encounter (Signed)
 Since we have reached out several times not sure exactly what more can be done in the situation. Will see her in follow-up if she calls us  back.   Dr. Jaquay Morneault

## 2024-03-18 ENCOUNTER — Telehealth: Payer: Self-pay | Admitting: Licensed Clinical Social Worker

## 2024-03-24 ENCOUNTER — Encounter: Payer: Self-pay | Admitting: Licensed Clinical Social Worker

## 2024-03-24 ENCOUNTER — Telehealth: Payer: Self-pay | Admitting: Licensed Clinical Social Worker

## 2024-03-29 ENCOUNTER — Ambulatory Visit: Attending: Cardiology | Admitting: Cardiology

## 2024-03-30 ENCOUNTER — Encounter: Payer: Self-pay | Admitting: Cardiology

## 2024-03-30 NOTE — Patient Instructions (Signed)
 Shirley [name] - I am sorry I was unable to reach you today for our scheduled appointment. I work with Alba Sharper, MD and am calling to support your healthcare needs. Please contact me at [phone] at your earliest convenience. I look forward to speaking with you soon.   Thank you,  Rolin Kerns, LCSW [facility], [facility] Clinical Social Worker Direct Dial: [phone]  Fax: [phone] Website: [email or web address] 9:49 AM

## 2024-04-09 ENCOUNTER — Other Ambulatory Visit: Payer: Self-pay

## 2024-04-09 DIAGNOSIS — Z6841 Body Mass Index (BMI) 40.0 and over, adult: Secondary | ICD-10-CM

## 2024-04-09 DIAGNOSIS — I10 Essential (primary) hypertension: Secondary | ICD-10-CM

## 2024-04-09 DIAGNOSIS — R7309 Other abnormal glucose: Secondary | ICD-10-CM

## 2024-04-09 DIAGNOSIS — I429 Cardiomyopathy, unspecified: Secondary | ICD-10-CM

## 2024-04-09 MED ORDER — WEGOVY 2.4 MG/0.75ML ~~LOC~~ SOAJ
2.4000 mg | SUBCUTANEOUS | 0 refills | Status: AC
Start: 2024-04-09 — End: ?

## 2024-04-12 ENCOUNTER — Other Ambulatory Visit (HOSPITAL_COMMUNITY): Payer: Self-pay

## 2024-04-12 ENCOUNTER — Other Ambulatory Visit: Payer: Self-pay | Admitting: *Deleted

## 2024-04-12 ENCOUNTER — Ambulatory Visit: Admitting: Family Medicine

## 2024-04-12 ENCOUNTER — Ambulatory Visit

## 2024-04-12 ENCOUNTER — Telehealth: Payer: Self-pay

## 2024-04-12 NOTE — Patient Instructions (Signed)
 Hue M Tecson - I am sorry I was unable to reach you today for our scheduled appointment. I work with Alba Sharper, MD and am calling to support your healthcare needs.  I have rescheduled your appointment for 05/13/2024 @ 11 am. Please contact me at [phone] at your earliest convenience. I look forward to speaking with you soon.   Thank you, Ariv Penrod, RN, BSN, ACM RN Care Manager [employer] [phone]

## 2024-04-12 NOTE — Telephone Encounter (Signed)
 Pharmacy Patient Advocate Encounter   Received notification from CoverMyMeds that prior authorization for WEGOVY  2.4MG  is required/requested.   Insurance verification completed.   The patient is insured through [facility] MEDICAID.   Effective October 1st, Medicaid will discontinue coverage of GLP1 medications for weight loss (such as Wegovy  and Zepbound), unless the patient has a documented history of a heart attack or stroke. Zepbound will continue to be covered only for patients with moderate to severe sleep apnea (AHI 15-30) and a BMI greater than 40. Because of this change, the prior authorization team will not be submitting new PA requests for GLP1 medications prescribed for weight loss, as patients will be unable to continue therapy under Medicaid coverage.

## 2024-04-14 ENCOUNTER — Ambulatory Visit: Attending: Orthopaedic Surgery

## 2024-04-14 NOTE — Therapy (Deleted)
 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   Depression    Disorder of hip joint 02/16/2018   Edema, lower extremity    Elevated alkaline phosphatase level 10/05/2019   GERD (gastroesophageal reflux disease)    Hypertension    Hypertensive emergency 07/03/2023   Joint pain    Low HDL (under 40) 10/05/2019   Neck pain 07/16/2019   Obesity    Osteoarthritis    Pre-eclampsia, antepartum 07/09/2023   Rheumatoid arthritis (HCC)    Sciatica    Shortness of breath    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   MRI (06/11/2016)  T11-T12: degenerative disc bulge and facet hypertrophy w/o significant stenosis  L4-L5: mild b/l facet arthrosis  L5-S1: mild b/l facet arthrosis, sm central disc protrusion w/o significant stenosis or frank neural impingement and mod b/l foraminal narrowing at L5-S1 related to disc bulge and facet disease, right worse than left     Vitamin D  deficiency 10/05/2019   Past Surgical History:  Procedure Laterality Date   ARTHROSCOPY, ANKLE WITH DEBRIDEMENT Left 09/04/2023   Procedure: ARTHROSCOPY, ANKLE WITH DEBRIDEMENT;  Surgeon: Elsa Lonni SAUNDERS, MD;  Location: [facility] OR;  Service: Orthopedics;  Laterality: Left;   CESAREAN SECTION     CESAREAN SECTION WITH BILATERAL TUBAL LIGATION N/A 07/07/2023   Procedure: CESAREAN SECTION WITH BILATERAL TUBAL LIGATION;  Surgeon: Barbra Lang PARAS, DO;  Location: [facility] LD ORS;  Service: Obstetrics;  Laterality: N/A;  Bilateral Tubal ligation    ORIF ANKLE FRACTURE Left 09/04/2023   Procedure: OPEN REDUCTION INTERNAL FIXATION (ORIF) MEDIAL MALLEOLUS FRACTURE, CLOSED TREATMENT OF FIRST METATARSAL BASE FRACTURE. OPEN TREATMENT INTERNAL FIXATION OF FIRST TARSOMETATARSAL JOINT;  Surgeon: Elsa Lonni SAUNDERS, MD;  Location: [facility] OR;  Service: Orthopedics;  Laterality: Left;  REQUEST 1 HOUR FOR ALL   TOOTH EXTRACTION N/A 10/22/2013   Procedure: DENTAL EXTRACTIONS TEETH #1, 16, 17, 32;  Surgeon: Glendia CHRISTELLA Primrose, DDS;  Location: [facility] OR;  Service: Oral Surgery;  Laterality: N/A;   TUBAL LIGATION  07/07/2023   Procedure: BILATERAL TUBAL LIGATION;  Surgeon: Barbra Lang PARAS, DO;  Location: [facility] LD ORS;  Service: Obstetrics;;   Patient Active Problem List   Diagnosis Date Noted   PTSD (post-traumatic stress disorder) 10/08/2023   MDD (major depressive disorder) 10/08/2023   GAD (generalized anxiety disorder) 10/08/2023   Postpartum depression 08/12/2023   Compressed cervical disc 08/11/2023   Mitral valve insufficiency 07/10/2023   Nonrheumatic mitral valve regurgitation 07/09/2023   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  Rationale for Evaluation and Treatment: Rehabilitation  ONSET DATE: ***  SUBJECTIVE:   SUBJECTIVE STATEMENT: ***  PERTINENT HISTORY: ***  PAIN:  Are you having pain?  Yes: NPRS scale: *** Pain location: *** Pain description: *** Aggravating  factors: *** Relieving factors: ***  PRECAUTIONS: {Therapy precautions:24002}  RED FLAGS: {PT Red Flags:29287}   WEIGHT BEARING RESTRICTIONS: {Yes ***/No:24003}  FALLS:  Has patient fallen in last 6 months? {fallsyesno:27318}  LIVING ENVIRONMENT: Lives with: {[facility] lives with:25569::lives with their family} Lives in: {Lives in:25570} Stairs: {opstairs:27293} Has following equipment at home: {Assistive devices:23999}  OCCUPATION: ***  PLOF: {PLOF:24004}  PATIENT GOALS: ***  NEXT MD VISIT: ***  OBJECTIVE:  Note: Objective measures were completed at Evaluation unless otherwise noted.  DIAGNOSTIC FINDINGS: ***  PATIENT SURVEYS:  {rehab surveys:24030}  COGNITION: Overall cognitive status: {cognition:24006}     SENSATION: {sensation:27233}  EDEMA:  {edema:24020}  MUSCLE LENGTH: Hamstrings: Right *** deg; Left *** deg Debby test: Right *** deg; Left *** deg  POSTURE: {posture:25561}  PALPATION: ***  LOWER EXTREMITY ROM:  {AROM/PROM:27142} ROM Right eval Left eval  Hip flexion    Hip extension    Hip abduction    Hip adduction    Hip internal rotation    Hip external rotation    Knee flexion    Knee extension    Ankle dorsiflexion    Ankle plantarflexion    Ankle inversion    Ankle eversion     (Blank rows = not tested)  LOWER EXTREMITY MMT:  MMT Right eval Left eval  Hip flexion    Hip extension    Hip abduction    Hip adduction    Hip internal rotation    Hip external rotation    Knee flexion    Knee extension    Ankle dorsiflexion    Ankle plantarflexion    Ankle inversion    Ankle eversion     (Blank rows = not tested)  LOWER EXTREMITY SPECIAL TESTS:  {LEspecialtests:26242}  FUNCTIONAL TESTS:  {Functional tests:24029}  GAIT: Distance walked: *** Assistive device utilized: {Assistive devices:23999} Level of assistance: {Levels of assistance:24026} Comments: ***   TREATMENT: [facility] Adult PT Treatment:                                                 DATE: *** Therapeutic Exercise: *** Manual Therapy: *** Neuromuscular re-ed: *** Therapeutic Activity: *** Modalities: *** Self Care: ***  PATIENT EDUCATION:  Education details: *** Person educated: {Person educated:25204} Education method: {Education Method:25205} Education comprehension: {Education Comprehension:25206}  HOME EXERCISE PROGRAM: ***  ASSESSMENT:  CLINICAL IMPRESSION: Patient is a *** y.o. *** who was seen today for physical therapy evaluation and treatment for ***.   OBJECTIVE IMPAIRMENTS: {opptimpairments:25111}.   ACTIVITY LIMITATIONS: {activitylimitations:27494}  PARTICIPATION LIMITATIONS: {participationrestrictions:25113}  PERSONAL FACTORS: {Personal factors:25162} are also affecting patient's functional outcome.   REHAB POTENTIAL: {rehabpotential:25112}  CLINICAL DECISION MAKING: {clinical decision making:25114}  EVALUATION COMPLEXITY: {Evaluation complexity:25115}   GOALS: Goals reviewed with patient? {yes/no:20286}  SHORT TERM GOALS: Target date: *** *** Baseline: Goal status: INITIAL  2.  *** Baseline:  Goal status: INITIAL  LONG TERM GOALS: Target date: ***  *** Baseline:  Goal status: INITIAL  2.  *** Baseline:  Goal status: INITIAL  3.  *** Baseline:  Goal status: INITIAL  4.  ***  Baseline:  Goal status: INITIAL  5.  *** Baseline:  Goal status: INITIAL  6.  *** Baseline:  Goal status: INITIAL   PLAN:  PT FREQUENCY: {rehab frequency:25116}  PT DURATION: {rehab duration:25117}  PLANNED INTERVENTIONS: {rehab planned interventions:25118::97110-Therapeutic exercises,97530- Therapeutic [phone]- Neuromuscular re-education,97535- Self Rjmz,02859- Manual therapy,Patient/Family education}  PLAN FOR NEXT SESSION: PIERRETTE Alm JAYSON Johna, PT 04/14/2024, 8:05 AM

## 2024-04-15 ENCOUNTER — Ambulatory Visit: Admitting: Family Medicine

## 2024-04-15 NOTE — Progress Notes (Deleted)
    SUBJECTIVE:   CHIEF COMPLAINT / HPI: discuss wegovy   Discussed the use of AI scribe software for clinical note transcription with the patient, who gave verbal consent to proceed.  History of Present Illness     PERTINENT  PMH / PSH: HTN, Maternal cardiomyopathy, severe obesity  OBJECTIVE:   There were no vitals taken for this visit.  Physical Exam    ASSESSMENT/PLAN:   Assessment & Plan  Assessment and Plan Assessment & Plan      No follow-ups on file.  Shirley Provencal, MD, PGY-3 [facility] 8:08 AM 04/15/2024  [facility]

## 2024-04-19 ENCOUNTER — Encounter: Payer: Self-pay | Admitting: Family Medicine

## 2024-04-19 ENCOUNTER — Ambulatory Visit (INDEPENDENT_AMBULATORY_CARE_PROVIDER_SITE_OTHER): Admitting: Family Medicine

## 2024-04-19 VITALS — BP 160/108 | HR 79 | Ht 65.0 in | Wt 286.1 lb

## 2024-04-19 DIAGNOSIS — I10 Essential (primary) hypertension: Secondary | ICD-10-CM

## 2024-04-19 DIAGNOSIS — Z6841 Body Mass Index (BMI) 40.0 and over, adult: Secondary | ICD-10-CM

## 2024-04-19 DIAGNOSIS — R400 Somnolence: Secondary | ICD-10-CM

## 2024-04-19 DIAGNOSIS — E66813 Obesity, class 3: Secondary | ICD-10-CM | POA: Diagnosis not present

## 2024-04-19 DIAGNOSIS — I429 Cardiomyopathy, unspecified: Secondary | ICD-10-CM

## 2024-04-19 MED ORDER — AMLODIPINE BESYLATE 10 MG PO TABS: 10.0000 mg | ORAL_TABLET | Freq: Every day | ORAL | 0 refills | Status: AC

## 2024-04-19 MED ORDER — OLMESARTAN MEDOXOMIL-HCTZ 40-25 MG PO TABS: 1.0000 | ORAL_TABLET | Freq: Every day | ORAL | 1 refills | Status: AC

## 2024-04-19 NOTE — Patient Instructions (Signed)
 It was great to see you! Thank you for allowing me to participate in your care!  Our plans for today:   VISIT SUMMARY: Today, we discussed your ongoing weight management, hypertension, potential sleep apnea, and cardiac history. We reviewed your current medications and made adjustments to better manage your conditions.  YOUR PLAN: OBESITY, CLASS 3: You have been managing your weight with Wegovy , but have missed doses due to insurance issues. Previously, you lost weight with phentermine  and lifestyle changes, but had to stop due to pregnancy. -Phentermine  is not recommended due to your current blood pressure levels. -We may consider Topamax for weight loss, but be aware it can cause sleepiness. -Continue with your exercise routine and healthy diet. -We will reassess your weight management options after your sleep study and once your blood pressure is under control.  ESSENTIAL HYPERTENSION: Your blood pressure is currently not well controlled. You are on Benicar  but have not been taking it consistently. -Increase Benicar  to olmesartan  40 mg and hydrochlorothiazide  25 mg. -Restart amlodipine  10mg  daily. -I have sent these to your pharmacy. -Schedule a follow-up in 2-3 weeks to check your blood pressure and labs.  SUSPECTED OBSTRUCTIVE SLEEP APNEA: You have symptoms that suggest a high risk for obstructive sleep apnea, such as snoring, daytime sleepiness, and observed apneas during sleep. -We will order a sleep study to evaluate for obstructive sleep apnea. They should all you to schedule.  CARDIAC DISEASE COMPLICATING PREGNANCY, HISTORY OF: You have a history of cardiac disease during pregnancy and missed your last cardiology appointment. -Please call your cardiologist to reschedule an appointment.    Please arrive 15 minutes PRIOR to your next scheduled appointment time! If you do not, this affects OTHER patients' care.  Take care and seek immediate care sooner if you develop any  concerns.   Shirley Provencal, MD, PGY-3 [facility] 3:04 PM 04/19/2024  [facility]

## 2024-04-19 NOTE — Assessment & Plan Note (Signed)
 Uncontrolled, asymptomatic.  - Increase Benicar  to olmesartan  40 mg and hydrochlorothiazide  25 mg. - Restart amlodipine  10mg  daily. - Schedule follow-up in 2-3 weeks to check blood pressure and labs. - BMP 2 weeks; follow-up 2 weeks - Directed to call her cardiologist to reschedule follow-up appointment for repeat ECHO

## 2024-04-19 NOTE — Progress Notes (Signed)
    SUBJECTIVE:   CHIEF COMPLAINT / HPI: discuss wegovy   Discussed the use of AI scribe software for clinical note transcription with the patient, who gave verbal consent to proceed.  History of Present Illness Shirley [name] is a 33 year old female who presents for a follow-up regarding Wegovy  treatment.  Obesity and weight management - On Wegovy  for weight management for four to five months - Medicaid discontinued coverage for Wegovy  for obesity without concomitant diagnosis of heart disease or sleep apnea - Last dose of Wegovy  taken three weeks ago; unable to obtain medication since October - Prior to Wegovy , lost approximately eleven pounds with phentermine  and lifestyle modifications before discontinuing due to pregnancy  Sleep-disordered breathing symptoms - Snoring present - Daytime sleepiness - Observed apneas during sleep, particularly during pregnancy  Hypertension - Currently prescribed Benicar , but not taken consistently - Previously on two antihypertensive medications, now only on Benicar  - Blood pressure readings have been elevated - Denies chest pain, some SOB with exertion, no headache, no blurry vision  Cardiac history - History of pregnancy-related heart disease - Last cardiology visit in April - Missed most recent cardiology appointment    PERTINENT  PMH / PSH: HTN, Maternal Cardiomyopathy, Hx of Mitral valve insufficiency, Obesity, IDA, PTSD  OBJECTIVE:   BP (!) 160/108   Pulse 79   Ht 5' 5 (1.651 m)   Wt 286 lb 2 oz (129.8 kg)   LMP 04/07/2024   SpO2 100%   BMI 47.61 kg/m   Physical Exam General: NAD, well appearing Neuro: A&O Cardiovascular: RRR, no murmurs,  Respiratory: normal WOB on RA, CTAB, no wheezes, ronchi or rales Extremities: Moving all 4 extremities equally, no peripheral edema    ASSESSMENT/PLAN:   Assessment & Plan Essential hypertension 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, antepartum  (HCC) Uncontrolled, asymptomatic.  - Increase Benicar  to olmesartan  40 mg and hydrochlorothiazide  25 mg. - Restart amlodipine  10mg  daily. - Schedule follow-up in 2-3 weeks to check blood pressure and labs. - BMP 2 weeks; follow-up 2 weeks - Directed to call her cardiologist to reschedule follow-up appointment for repeat ECHO Daytime sleepiness Class 3 severe obesity with serious comorbidity and body mass index (BMI) of 50.0 to 59.9 in adult, unspecified obesity type (HCC) Suspected obstructive sleep apnea. High STOP-BANG score of 5.At significant risk of future complications. - Split night sleep study ordered. - Continue lifestyle modifications. - If diagnosed with OSA; can restart Wegovy  as will be covered by medicaid - Consider phentermine /topamax combo for weight loss in the interim once BP is under control.  Return in about 2 weeks (around 05/03/2024).  Ozell Provencal, MD, PGY-3 [facility] 3:06 PM 04/19/2024  [facility]

## 2024-04-19 NOTE — Assessment & Plan Note (Signed)
 Suspected obstructive sleep apnea. High STOP-BANG score of 5.At significant risk of future complications. - Split night sleep study ordered. - Continue lifestyle modifications. - If diagnosed with OSA; can restart Wegovy  as will be covered by medicaid - Consider phentermine /topamax combo for weight loss in the interim once BP is under control.

## 2024-05-13 ENCOUNTER — Other Ambulatory Visit: Payer: Self-pay | Admitting: *Deleted

## 2024-05-13 ENCOUNTER — Other Ambulatory Visit: Payer: Self-pay

## 2024-05-13 NOTE — Patient Instructions (Signed)
 Visit Information  Ms. Ashraf was given information about Medicaid Managed Care team care coordination services as a part of their [facility] Community Plan Medicaid benefit.   If you would like to schedule transportation through your [facility], please call the following number at least 2 days in advance of your appointment: [phone]   Rides for urgent appointments can also be made after hours by calling Member Services.  Call the Behavioral Health Crisis Line at [phone], at any time, 24 hours a day, 7 days a week. If you are in danger or need immediate medical attention call 911.  Please see education materials related to HTN provided by MyChart link.  Care plan and visit instructions communicated with the patient verbally today. Patient agrees to receive a copy in MyChart. Active MyChart status and patient understanding of how to access instructions and care plan via MyChart confirmed with patient.     Marketa Midkiff, RN, BSN, ACM RN Care Manager [facility] [phone]   Following is a copy of your plan of care:  There are no care plans that you recently modified to display for this patient.  Visit Information  Ms. Schild was given information about Medicaid Managed Care team care coordination services as a part of their [facility] Community Plan Medicaid benefit.   If you would like to schedule transportation through your [facility], please call the following number at least 2 days in advance of your appointment: [phone]   Rides for urgent appointments can also be made after hours by calling Member Services.  Call the Behavioral Health Crisis Line at [phone], at any time, 24 hours a day, 7 days a week. If you are in danger or need immediate medical attention call 911.  Please see education materials related to HTN provided by MyChart link.  Care plan and visit instructions communicated with  the patient verbally today. Patient agrees to receive a copy in MyChart. Active MyChart status and patient understanding of how to access instructions and care plan via MyChart confirmed with patient.     Telephone follow up appointment with Managed Medicaid care management team member scheduled for: If patient returns call to provider office, please advise to call Managed Medicaid Care Guide at [phone]. Telephone follow up appointment with Managed Medicaid care management team member scheduled for: 06/21/24 @ 11 am  Kensington Duerst, RN, SCIENTIST, RESEARCH (PHYSICAL SCIENCES), Theatre Manager [employer] [phone]
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
## 2024-05-20 ENCOUNTER — Ambulatory Visit: Attending: Orthopaedic Surgery | Admitting: Physical Therapy

## 2024-05-21 ENCOUNTER — Ambulatory Visit

## 2024-05-21 NOTE — Therapy (Incomplete)
 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   Depression    Disorder of hip joint 02/16/2018   Edema, lower extremity    Elevated alkaline phosphatase level 10/05/2019   GERD (gastroesophageal reflux disease)    Hypertension    Hypertensive emergency 07/03/2023   Joint pain    Low HDL (under 40) 10/05/2019   Neck pain 07/16/2019   Obesity    Osteoarthritis    Pre-eclampsia, antepartum 07/09/2023   Rheumatoid arthritis (HCC)    Sciatica    Shortness of breath    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   MRI (06/11/2016)  T11-T12: degenerative disc bulge and facet hypertrophy w/o significant stenosis  L4-L5: mild b/l facet arthrosis  L5-S1: mild b/l facet arthrosis, sm central disc protrusion w/o significant stenosis or frank neural impingement and mod b/l foraminal narrowing at L5-S1 related to disc bulge and facet disease, right worse than left     Vitamin D  deficiency 10/05/2019   Past Surgical History:  Procedure Laterality Date   ARTHROSCOPY, ANKLE WITH DEBRIDEMENT Left 09/04/2023   Procedure: ARTHROSCOPY, ANKLE WITH DEBRIDEMENT;  Surgeon: Elsa Lonni SAUNDERS, MD;  Location: [facility] OR;  Service: Orthopedics;  Laterality: Left;   CESAREAN SECTION     CESAREAN SECTION WITH BILATERAL TUBAL LIGATION N/A 07/07/2023   Procedure: CESAREAN SECTION WITH BILATERAL TUBAL LIGATION;  Surgeon: Barbra Lang PARAS, DO;  Location: [facility] LD ORS;  Service: Obstetrics;  Laterality: N/A;  Bilateral Tubal ligation    ORIF ANKLE FRACTURE Left 09/04/2023   Procedure: OPEN REDUCTION INTERNAL FIXATION (ORIF) MEDIAL MALLEOLUS FRACTURE, CLOSED TREATMENT OF FIRST METATARSAL BASE FRACTURE. OPEN TREATMENT INTERNAL FIXATION OF FIRST TARSOMETATARSAL JOINT;  Surgeon: Elsa Lonni SAUNDERS, MD;  Location: [facility] OR;  Service: Orthopedics;  Laterality: Left;  REQUEST 1 HOUR FOR ALL   TOOTH EXTRACTION N/A 10/22/2013   Procedure: DENTAL EXTRACTIONS TEETH #1, 16, 17, 32;  Surgeon: Glendia CHRISTELLA Primrose, DDS;  Location: [facility] OR;  Service: Oral Surgery;  Laterality: N/A;   TUBAL LIGATION  07/07/2023   Procedure: BILATERAL TUBAL LIGATION;  Surgeon: Barbra Lang PARAS, DO;  Location: [facility] ORS;  Service: Obstetrics;;   Patient Active Problem List   Diagnosis Date Noted   PTSD (post-traumatic stress disorder) 10/08/2023   MDD (major depressive disorder) 10/08/2023   GAD (generalized anxiety disorder) 10/08/2023   Postpartum depression 08/12/2023   Compressed cervical disc 08/11/2023   Mitral valve insufficiency 07/10/2023   Nonrheumatic mitral valve regurgitation 07/09/2023   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, antepartum (HCC) 07/06/2023   Iron  deficiency anemia 03/14/2023   Elevated hemoglobin A1c 03/14/2023   Vitamin D  deficiency 10/05/2019   Osteoarthritis of both knees 09/29/2015   Class 3 severe obesity with serious comorbidity and body mass index (BMI) of 50.0 to 59.9 in adult [facility]) 12/02/2006   Essential hypertension 08/28/2006    PCP: Alba Sharper, MD  REFERRING PROVIDER: Elsa Lonni SAUNDERS, MD  REFERRING DIAG:  [phone] (ICD-10-CM) - Bilateral hip pain M25.552 (ICD-10-CM) - Pain in left hip  THERAPY DIAG:  No diagnosis found.  Rationale for Evaluation and Treatment: {HABREHAB:27488}  ONSET DATE: ***  SUBJECTIVE:   SUBJECTIVE STATEMENT: ***  PERTINENT HISTORY: *** PAIN:  Are you having pain? {OPRCPAIN:27236}  PRECAUTIONS: {Therapy precautions:24002}  RED FLAGS: {PT  Red Flags:29287}   WEIGHT BEARING RESTRICTIONS: {Yes ***/No:24003}  FALLS:  Has patient fallen in last 6 months? {fallsyesno:27318}  LIVING ENVIRONMENT: Lives with: {OPRC lives with:25569::lives with their family} Lives in: {Lives in:25570} Stairs: {opstairs:27293} Has following equipment at home: {Assistive devices:23999}  OCCUPATION: ***  PLOF: {PLOF:24004}  PATIENT GOALS: ***  NEXT MD VISIT: ***  OBJECTIVE:  Note: Objective measures were completed at Evaluation unless otherwise noted.  DIAGNOSTIC FINDINGS: ***  PATIENT SURVEYS:  {rehab surveys:24030}  COGNITION: Overall cognitive status: {cognition:24006}     SENSATION: {sensation:27233}  EDEMA:  {edema:24020}  MUSCLE LENGTH: Hamstrings: Right *** deg; Left *** deg Debby test: Right *** deg; Left *** deg  POSTURE: {posture:25561}  PALPATION: ***  LOWER EXTREMITY ROM:  {AROM/PROM:27142} ROM Right eval Left eval  Hip flexion    Hip extension    Hip abduction    Hip adduction    Hip internal rotation    Hip external rotation    Knee flexion    Knee extension    Ankle dorsiflexion    Ankle plantarflexion    Ankle inversion    Ankle eversion     (Blank rows = not tested)  LOWER EXTREMITY MMT:  MMT Right eval Left eval  Hip flexion    Hip extension    Hip abduction    Hip adduction    Hip internal rotation    Hip external rotation    Knee flexion    Knee extension    Ankle dorsiflexion    Ankle plantarflexion    Ankle inversion    Ankle eversion     (Blank rows = not tested)  LOWER EXTREMITY SPECIAL TESTS:  {LEspecialtests:26242}  FUNCTIONAL TESTS:  {Functional tests:24029}  GAIT: Distance walked: *** Assistive device utilized: {Assistive devices:23999} Level of assistance: {Levels of assistance:24026} Comments: ***                                                                                                                                TREATMENT DATE: ***     PATIENT EDUCATION:  Education details: *** Person educated: {Person educated:25204} Education method: {Education Method:25205} Education comprehension: {Education Comprehension:25206}  HOME EXERCISE PROGRAM: ***  ASSESSMENT:  CLINICAL IMPRESSION: Patient is a *** y.o. *** who was seen today for physical therapy evaluation and treatment for ***.   OBJECTIVE IMPAIRMENTS: {opptimpairments:25111}.   ACTIVITY LIMITATIONS: {activitylimitations:27494}  PARTICIPATION LIMITATIONS: {participationrestrictions:25113}  PERSONAL FACTORS: {Personal factors:25162} are also affecting patient's functional outcome.   REHAB POTENTIAL: {rehabpotential:25112}  CLINICAL DECISION MAKING: {clinical decision making:25114}  EVALUATION COMPLEXITY: {Evaluation complexity:25115}   GOALS: Goals reviewed with patient? {yes/no:20286}  SHORT TERM GOALS: Target date: *** *** Baseline: Goal status: INITIAL  2.  ***  Baseline:  Goal status: INITIAL  3.  *** Baseline:  Goal status: INITIAL  4.  *** Baseline:  Goal status: INITIAL  5.  *** Baseline:  Goal status: INITIAL  6.  *** Baseline:  Goal status: INITIAL  LONG TERM GOALS: Target date: ***  *** Baseline:  Goal status: INITIAL  2.  *** Baseline:  Goal status: INITIAL  3.  *** Baseline:  Goal status: INITIAL  4.  *** Baseline:  Goal status: INITIAL  5.  *** Baseline:  Goal status: INITIAL  6.  *** Baseline:  Goal status: INITIAL   PLAN:  PT FREQUENCY: {rehab frequency:25116}  PT DURATION: {rehab duration:25117}  PLANNED INTERVENTIONS: {rehab planned interventions:25118::97110-Therapeutic exercises,97530- Therapeutic [phone]- Neuromuscular re-education,97535- Self Rjmz,02859- Manual therapy,Patient/Family education}  PLAN FOR NEXT SESSION: PIERRETTE Alm JAYSON Johna, PT 05/21/2024, 8:04 AM

## 2024-06-21 ENCOUNTER — Ambulatory Visit: Payer: Self-pay | Admitting: Family Medicine

## 2024-06-21 ENCOUNTER — Other Ambulatory Visit: Payer: Self-pay | Admitting: *Deleted

## 2024-06-21 NOTE — Patient Instructions (Signed)
 Rosalin M Kinnear - I am sorry I was unable to reach you today for our scheduled appointment. I work with Alba Sharper, MD and am calling to support your healthcare needs.  I have reschedule your appointment for 07/26/24 @ 1:30 pm.  Please contact me at [phone] at your earliest convenience. I look forward to speaking with you soon.   Thank you,  Stuti Sandin, RN, BSN, ACM RN Care Manager [employer] [phone]

## 2024-06-21 NOTE — Progress Notes (Deleted)
" ° ° °  SUBJECTIVE:   CHIEF COMPLAINT / HPI: Weight loss, tingling in toes  Discussed the use of AI scribe software for clinical note transcription with the patient, who gave verbal consent to proceed.  History of Present Illness     PERTINENT  PMH / PSH: Hypertension, history of maternal cardiomyopathy, mitral valve regurgitation, obesity, IDA, PTSD, MDD, Tubal Ligation  OBJECTIVE:   There were no vitals taken for this visit.  Physical Exam    ASSESSMENT/PLAN:   Assessment & Plan  Assessment and Plan Assessment & Plan      No follow-ups on file.  Shirley Provencal, MD, PGY-3 [facility] 8:15 AM 06/21/2024  [facility]   "

## 2024-07-26 ENCOUNTER — Other Ambulatory Visit: Payer: Self-pay | Admitting: *Deleted

## 2024-07-26 ENCOUNTER — Ambulatory Visit: Payer: Self-pay | Admitting: Family Medicine

## 2024-07-26 NOTE — Patient Instructions (Signed)
 Aeisha M Creek - I am sorry I was unable to reach you today for our scheduled appointment.  I have rescheduled your appointment for 09/13/24 @ 1030 am.  I work with Alba Sharper, MD and am calling to support your healthcare needs. Please contact me at [phone] at your earliest convenience. I look forward to speaking with you soon.   Thank you,  Tiombe Tomeo, RN, BSN, ACM RN Care Manager [employer] [phone]

## 2024-09-13 ENCOUNTER — Telehealth: Admitting: *Deleted
